# Patient Record
Sex: Male | Born: 1940 | Hispanic: No | Marital: Married | Smoking: Former smoker
Health system: Southern US, Community
[De-identification: ages and names within clinical notes are randomized; demographics above are authoritative.]

## PROBLEM LIST (undated history)

## (undated) DIAGNOSIS — E119 Type 2 diabetes mellitus without complications: Secondary | ICD-10-CM

## (undated) DIAGNOSIS — Z9289 Personal history of other medical treatment: Secondary | ICD-10-CM

## (undated) DIAGNOSIS — K219 Gastro-esophageal reflux disease without esophagitis: Secondary | ICD-10-CM

## (undated) DIAGNOSIS — C801 Malignant (primary) neoplasm, unspecified: Secondary | ICD-10-CM

## (undated) DIAGNOSIS — I10 Essential (primary) hypertension: Secondary | ICD-10-CM

## (undated) DIAGNOSIS — C7B8 Other secondary neuroendocrine tumors: Secondary | ICD-10-CM

## (undated) DIAGNOSIS — C61 Malignant neoplasm of prostate: Secondary | ICD-10-CM

## (undated) DIAGNOSIS — C3490 Malignant neoplasm of unspecified part of unspecified bronchus or lung: Secondary | ICD-10-CM

## (undated) DIAGNOSIS — C7A8 Other malignant neuroendocrine tumors: Secondary | ICD-10-CM

## (undated) DIAGNOSIS — M1990 Unspecified osteoarthritis, unspecified site: Secondary | ICD-10-CM

## (undated) DIAGNOSIS — E785 Hyperlipidemia, unspecified: Secondary | ICD-10-CM

## (undated) DIAGNOSIS — Z5189 Encounter for other specified aftercare: Secondary | ICD-10-CM

## (undated) DIAGNOSIS — M109 Gout, unspecified: Secondary | ICD-10-CM

## (undated) DIAGNOSIS — Z973 Presence of spectacles and contact lenses: Secondary | ICD-10-CM

## (undated) HISTORY — DX: Malignant neoplasm of prostate: C61

## (undated) HISTORY — DX: Hyperlipidemia, unspecified: E78.5

## (undated) HISTORY — DX: Essential (primary) hypertension: I10

## (undated) HISTORY — DX: Other malignant neuroendocrine tumors: C7A.8

## (undated) HISTORY — DX: Other malignant neuroendocrine tumors: C7B.8

## (undated) HISTORY — PX: PROSTATE BIOPSY: SHX241

---

## 2003-02-01 HISTORY — PX: INGUINAL HERNIA REPAIR: SUR1180

## 2008-02-01 HISTORY — PX: COLONOSCOPY: SHX174

## 2008-07-31 ENCOUNTER — Ambulatory Visit: Payer: Self-pay | Admitting: Internal Medicine

## 2008-08-15 ENCOUNTER — Ambulatory Visit: Payer: Self-pay | Admitting: Internal Medicine

## 2008-08-15 ENCOUNTER — Encounter: Admission: RE | Admit: 2008-08-15 | Discharge: 2008-08-15 | Payer: Self-pay | Admitting: Internal Medicine

## 2008-08-15 DIAGNOSIS — K4090 Unilateral inguinal hernia, without obstruction or gangrene, not specified as recurrent: Secondary | ICD-10-CM | POA: Insufficient documentation

## 2008-09-19 ENCOUNTER — Encounter: Payer: Self-pay | Admitting: Internal Medicine

## 2008-10-23 ENCOUNTER — Ambulatory Visit (HOSPITAL_COMMUNITY): Admission: RE | Admit: 2008-10-23 | Discharge: 2008-10-24 | Payer: Self-pay | Admitting: General Surgery

## 2008-10-23 HISTORY — PX: OTHER SURGICAL HISTORY: SHX169

## 2008-12-03 ENCOUNTER — Encounter: Payer: Self-pay | Admitting: Internal Medicine

## 2010-05-07 LAB — DIFFERENTIAL
Basophils Relative: 1 % (ref 0–1)
Eosinophils Absolute: 0.1 10*3/uL (ref 0.0–0.7)
Eosinophils Relative: 2 % (ref 0–5)
Monocytes Relative: 8 % (ref 3–12)
Neutrophils Relative %: 54 % (ref 43–77)

## 2010-05-07 LAB — COMPREHENSIVE METABOLIC PANEL
ALT: 25 U/L (ref 0–53)
Alkaline Phosphatase: 52 U/L (ref 39–117)
CO2: 29 mEq/L (ref 19–32)
GFR calc non Af Amer: >60 mL/min (ref >60)
Glucose, Bld: 131 mg/dL — ABNORMAL HIGH (ref 70–99)
Potassium: 4.1 mEq/L (ref 3.5–5.1)
Sodium: 137 mEq/L (ref 135–145)
Total Protein: 7 g/dL (ref 6.0–8.3)

## 2010-05-07 LAB — GLUCOSE, CAPILLARY
Glucose-Capillary: 157 mg/dL — ABNORMAL HIGH (ref 70–99)
Glucose-Capillary: 165 mg/dL — ABNORMAL HIGH (ref 70–99)
Glucose-Capillary: 169 mg/dL — ABNORMAL HIGH (ref 70–99)

## 2010-05-07 LAB — CBC
Hemoglobin: 14 g/dL (ref 13.0–17.0)
RBC: 5.35 MIL/uL (ref 4.22–5.81)

## 2010-05-09 LAB — GLUCOSE, CAPILLARY
Glucose-Capillary: 155 mg/dL — ABNORMAL HIGH (ref 70–99)
Glucose-Capillary: 163 mg/dL — ABNORMAL HIGH (ref 70–99)

## 2013-03-03 DIAGNOSIS — C61 Malignant neoplasm of prostate: Secondary | ICD-10-CM

## 2013-03-03 HISTORY — DX: Malignant neoplasm of prostate: C61

## 2013-03-10 ENCOUNTER — Encounter: Payer: Self-pay | Admitting: Radiation Oncology

## 2013-03-10 DIAGNOSIS — I10 Essential (primary) hypertension: Secondary | ICD-10-CM | POA: Insufficient documentation

## 2013-03-10 DIAGNOSIS — C61 Malignant neoplasm of prostate: Secondary | ICD-10-CM | POA: Insufficient documentation

## 2013-03-10 DIAGNOSIS — E7800 Pure hypercholesterolemia, unspecified: Secondary | ICD-10-CM | POA: Insufficient documentation

## 2013-03-10 DIAGNOSIS — E119 Type 2 diabetes mellitus without complications: Secondary | ICD-10-CM | POA: Insufficient documentation

## 2013-03-10 NOTE — Progress Notes (Signed)
Radiation Oncology         (336) [phone] ________________________________  Initial outpatient Consultation  Name: [name] MRN: [id number]  Date: 03/11/2013  DOB: Aug 30, 1940  CC:No primary provider on file.  Claybon Jabs, MD   REFERRING PHYSICIAN: Claybon Jabs, MD  DIAGNOSIS: 73 y.o. gentleman with stage T1c adenocarcinoma of the prostate with a Gleason's score of 4+3 and a PSA of 4.09  HISTORY OF PRESENT ILLNESS::[name] is a 73 y.o. gentleman.  He was noted to have an elevated PSA of 4.09 by his primary care physician, Dr. Delfina Redwood.  Accordingly, he was referred for evaluation in urology by Dr. Karsten Ro on 01/08/13,  digital rectal examination was performed at that time revealing some induration in the right apical area.  The patient proceeded to transrectal ultrasound with 12 biopsies of the prostate on 02/19/2013.  The prostate volume measured 61 cc.  Out of 12 core biopsies, 3 were positive.  The maximum Gleason score was 4+3, and this was seen in 60% of the right lateral mid gland specimens. Gleason's 3+4 was seen in less than 5% of the right apex and Gleason's 3+3 was seen in 5% of the right lateral apex.   The patient reviewed the biopsy results with his urologist and he has kindly been referred today for discussion of potential radiation treatment options.     PREVIOUS RADIATION THERAPY: No  PAST MEDICAL HISTORY:  has a past medical history of Prostate cancer; Type II or unspecified type diabetes mellitus without mention of complication, uncontrolled; Hyperlipidemia; and Hypertension.    PAST SURGICAL HISTORY: Past Surgical History  Procedure Laterality Date  . Inguinal hernia repair    . Inguinal hernia repair    . Prostate biopsy      FAMILY HISTORY: family history includes Cancer in his brother, brother, mother, and sister; Heart disease in his father.  SOCIAL HISTORY:  reports that he quit smoking about 2 years ago. His smoking use included  Cigarettes. He has a 30 pack-year smoking history. He has never used smokeless tobacco. He reports that he does not drink alcohol or use illicit drugs.  ALLERGIES: Review of patient's allergies indicates no known allergies.  MEDICATIONS:  Current Outpatient Prescriptions  Medication Sig Dispense Refill  . aspirin 81 MG tablet Take 81 mg by mouth daily.      Marland Kitchen atorvastatin (LIPITOR) 40 MG tablet Take 40 mg by mouth daily.      . insulin glargine (LANTUS) 100 UNIT/ML injection Inject into the skin at bedtime.      . ramipril (ALTACE) 10 MG capsule Take 10 mg by mouth daily.      . sitaGLIPtin (JANUVIA) 100 MG tablet Take 100 mg by mouth daily.       No current facility-administered medications for this encounter.    REVIEW OF SYSTEMS:  A 15 point review of systems is documented in the electronic medical record. This was obtained by the nursing staff. However, I reviewed this with the patient to discuss relevant findings and make appropriate changes.  A comprehensive review of systems was negative..  The patient completed an IPSS and IIEF questionnaire.  His IPSS score was 1 indicating mild urinary outflow obstructive symptoms.  He indicated that his erectile function is able to complete sexual activity less than half of attempts.   PHYSICAL EXAM: This patient is in no acute distress.  He is alert and oriented.   height is 5\' 11"  (1.803 m) and weight is 233  lb (105.688 kg). His oral temperature is 98 F (36.7 C). His blood pressure is 168/95 and his pulse is 86. His respiration is 16 and oxygen saturation is 100%.  He exhibits no respiratory distress or labored breathing.  He appears neurologically intact.  His mood is pleasant.  His affect is appropriate.  Please note the digital rectal exam findings described above.  KPS = 100  100 - Normal; no complaints; no evidence of disease. 90   - Able to carry on normal activity; minor signs or symptoms of disease. 80   - Normal activity with effort;  some signs or symptoms of disease. 53   - Cares for self; unable to carry on normal activity or to do active work. 60   - Requires occasional assistance, but is able to care for most of his personal needs. 50   - Requires considerable assistance and frequent medical care. 48   - Disabled; requires special care and assistance. 56   - Severely disabled; hospital admission is indicated although death not imminent. 20   - Very sick; hospital admission necessary; active supportive treatment necessary. 10   - Moribund; fatal processes progressing rapidly. 0     - Dead  Karnofsky DA, Abelmann [place], Craver LS and Burchenal [facility] [phone]) The use of the nitrogen mustards in the palliative treatment of carcinoma: with particular reference to bronchogenic carcinoma Cancer 1 634-56   LABORATORY DATA:  Lab Results  Component Value Date   WBC 5.9 10/21/2008   HGB 14.0 10/21/2008   HCT 42.0 10/21/2008   MCV 78.6 10/21/2008   PLT 175 10/21/2008   Lab Results  Component Value Date   NA 137 10/21/2008   K 4.1 10/21/2008   CL 107 10/21/2008   CO2 29 10/21/2008   Lab Results  Component Value Date   ALT 25 10/21/2008   AST 20 10/21/2008   ALKPHOS 52 10/21/2008   BILITOT 0.4 10/21/2008     RADIOGRAPHY: No results found.    IMPRESSION: This gentleman is a 73 y.o. gentleman with stage T1c adenocarcinoma of the prostate with a Gleason's score of 4+3 and a PSA of 4.09.  His T-Stage, Gleason's Score, and PSA put him into the intermediate risk group. His primary Gleason's grade of 4 prevent him from being eligible for prostate seed implant as monotherapy. Accordingly he is eligible for a variety of potential treatment options including radical prostatectomy, external beam radiotherapy, or external beam radiotherapy with a prostate seed implant boost.  PLAN:Today I reviewed the findings and workup thus far.  We discussed the natural history of prostate cancer.  We reviewed the the implications of T-stage, Gleason's Score,  and PSA on decision-making and outcomes in prostate cancer.  We discussed radiation treatment in the management of prostate cancer with regard to the logistics and delivery of external beam radiation treatment as well as the logistics and delivery of prostate brachytherapy.  We compared and contrasted each of these approaches and also compared these against prostatectomy.  The patient expressed interest in external beam radiotherapy.  I filled out a patient counseling form for him with relevant treatment diagrams and we retained a copy for our records.   We discussed the role of androgen deprivation with radiotherapy in intermediate risk patients. We reviewed the pros and cons.  The patient would like to proceed with neoadjuvant androgen deprivation then prostate radiotherapy and seed implant boost.  I will share my findings with Dr. Karsten Ro and move forward with scheduling a  follow-up to discuss/proceed with [facility] and subsequent placement of three gold fiducial markers into the prostate to proceed with external radiation in the future.     I enjoyed meeting with him today, and will look forward to participating in the care of this very nice gentleman.  I spent 60 minutes face to face with the patient and more than 50% of that time was spent in counseling and/or coordination of care.   ------------------------------------------------  Sheral Apley. Tammi Klippel, M.D.

## 2013-03-11 ENCOUNTER — Ambulatory Visit
Admission: RE | Admit: 2013-03-11 | Discharge: 2013-03-11 | Disposition: A | Discharge status: Home / Self Care | Payer: Medicare Other | Source: Ambulatory Visit | Attending: Radiation Oncology | Admitting: Radiation Oncology

## 2013-03-11 ENCOUNTER — Encounter: Payer: Self-pay | Admitting: Radiation Oncology

## 2013-03-11 VITALS — BP 168/95 | HR 86 | Temp 98.0°F | Resp 16 | Ht 71.0 in | Wt 233.0 lb

## 2013-03-11 DIAGNOSIS — C61 Malignant neoplasm of prostate: Secondary | ICD-10-CM | POA: Insufficient documentation

## 2013-03-11 DIAGNOSIS — E785 Hyperlipidemia, unspecified: Secondary | ICD-10-CM | POA: Insufficient documentation

## 2013-03-11 DIAGNOSIS — E7800 Pure hypercholesterolemia, unspecified: Secondary | ICD-10-CM

## 2013-03-11 DIAGNOSIS — IMO0001 Reserved for inherently not codable concepts without codable children: Secondary | ICD-10-CM | POA: Insufficient documentation

## 2013-03-11 DIAGNOSIS — K4090 Unilateral inguinal hernia, without obstruction or gangrene, not specified as recurrent: Secondary | ICD-10-CM

## 2013-03-11 DIAGNOSIS — Z79899 Other long term (current) drug therapy: Secondary | ICD-10-CM | POA: Insufficient documentation

## 2013-03-11 DIAGNOSIS — I10 Essential (primary) hypertension: Secondary | ICD-10-CM | POA: Insufficient documentation

## 2013-03-11 DIAGNOSIS — E1165 Type 2 diabetes mellitus with hyperglycemia: Secondary | ICD-10-CM

## 2013-03-11 DIAGNOSIS — Z87891 Personal history of nicotine dependence: Secondary | ICD-10-CM | POA: Insufficient documentation

## 2013-03-11 DIAGNOSIS — Z7982 Long term (current) use of aspirin: Secondary | ICD-10-CM | POA: Insufficient documentation

## 2013-03-11 DIAGNOSIS — Z794 Long term (current) use of insulin: Secondary | ICD-10-CM | POA: Insufficient documentation

## 2013-03-11 DIAGNOSIS — E119 Type 2 diabetes mellitus without complications: Secondary | ICD-10-CM

## 2013-03-11 NOTE — Progress Notes (Signed)
See progress note under physician encounter. 

## 2013-03-11 NOTE — Progress Notes (Signed)
GU Location of Tumor / Histology: adenocarcinoma of the prostate  If Prostate Cancer, Gleason Score is (4 + 3=7) and PSA is (4.09 on 12/2012). Prostate volume: 61 cc.  Patient presented October 2009 with an elevated PSA.  Biopsies of prostate (if applicable) revealed:    Past/Anticipated interventions by urology, if any: TRUS/BX 02/19/2013  Past/Anticipated interventions by medical oncology, if any: None  Weight changes, if any:   Bowel/Bladder complaints, if any:    Nausea/Vomiting, if any:   Pain issues, if any:    SAFETY ISSUES:  Prior radiation? NO  Pacemaker/ICD? NO  Possible current pregnancy? NO  Is the patient on methotrexate? NO  Current Complaints / other details:  73 year old male.

## 2013-03-11 NOTE — Progress Notes (Signed)
IPSS of 1 noted. Denies dysuria, hematuria, diarrhea or constipation. Denies blood in stool or pain associated with bowel movements. Denies pain or fatigue. Reports a strong steady urine stream. Denies difficulty emptying his bladder. Has lost 5 lb since December by eating healthier. Reports occasional night sweats. Denies bone pain.

## 2013-03-12 ENCOUNTER — Telehealth: Payer: Self-pay | Admitting: *Deleted

## 2013-03-12 NOTE — Telephone Encounter (Signed)
CALLED PATIENT TO INFORM OF HORMONE (SHOT) FOR (03-19-13)- ARRIVAL TIME-  9:15 AM  AND HIS GOLD SEE PLACEMENT ON 04-22-13- ARRIVAL TIME- 1:15 PM AT DR. Simone Curia OFFICE, AND HIS SIM ON 04-26-13 @ 9:00 AM @ DR. MANNING'S OFFICE, SPOKE WITH PATIENT AND HE IS AWARE OF THESE APPTS.

## 2013-04-25 NOTE — Progress Notes (Signed)
  Radiation Oncology         [phone] ________________________________  Name: [name] MRN: [id number]  Date: 04/26/2013  DOB: 11-Mar-1940  SIMULATION AND TREATMENT PLANNING NOTE  DIAGNOSIS:  73 y.o. gentleman with stage T1c adenocarcinoma of the prostate with a Gleason's score of 4+3 and a PSA of 4.09  NARRATIVE:  The patient was brought to the [place].  Identity was confirmed.  All relevant records and images related to the planned course of therapy were reviewed.  The patient freely provided informed written consent to proceed with treatment after reviewing the details related to the planned course of therapy. The consent form was witnessed and verified by the simulation staff.  Then, the patient was set-up in a stable reproducible supine position for radiation therapy.  A vacuum lock pillow device was custom fabricated to position his legs in a reproducible immobilized position.  Then, I performed a urethrogram under sterile conditions to identify the prostatic apex.  CT images were obtained.  Surface markings were placed.  The CT images were loaded into the planning software.  Then the prostate target and avoidance structures including the rectum, bladder, bowel and hips were contoured.  Treatment planning then occurred.  The radiation prescription was entered and confirmed.  A total of 5 complex treatment devices were fabricated including a body fix immobilization bag for leg positioning with 4 multileaf collimators to shape radiation around the prostate from the anterior, posterior, right lateral, and left lateral gantry positions. I have requested : 3D Simulation  I have requested a DVH of the following structures: Prostate, bladder, rectum, hips, and small bowel.  PUBIC ARCH STUDY:  The patient is intended to receive possible prostate seed implant. His 3-dimensional image study set was employed to evaluate his candidacy. There, on each axial slice, I contoured the  prostate gland. Then, using three-dimensional radiation planning tools I reconstructed the prostate in view of the structures from the transperineal needle pathway to assess for possible pubic arch interference. In doing so, I did not appreciate any pubic arch interference. Also, the patient's prostate volume was estimated based on the drawn structure. The volume was 65 cc correlating relatively well with the ultrasound volume study at 61 cc.  The patient has started hormone therapy which should produce some further downsizing.  Given the pubic arch appearance and prostate volume, patient remains a good candidate to proceed with prostate seed boost.   PLAN:  The patient will receive 45 Gy in 25 fractions followed by prostate seed implant boost to additional dose of 110 Gy for a total normal dose of 155 Gy.  ________________________________  Sheral Apley Tammi Klippel, M.D.

## 2013-04-26 ENCOUNTER — Ambulatory Visit
Admission: RE | Admit: 2013-04-26 | Discharge: 2013-04-26 | Disposition: A | Discharge status: Home / Self Care | Payer: Medicare Other | Source: Ambulatory Visit | Attending: Radiation Oncology | Admitting: Radiation Oncology

## 2013-04-26 DIAGNOSIS — C61 Malignant neoplasm of prostate: Secondary | ICD-10-CM

## 2013-04-26 DIAGNOSIS — Z510 Encounter for antineoplastic radiation therapy: Secondary | ICD-10-CM | POA: Insufficient documentation

## 2013-05-03 ENCOUNTER — Encounter: Payer: Self-pay | Admitting: Radiation Oncology

## 2013-05-03 ENCOUNTER — Ambulatory Visit
Admission: RE | Admit: 2013-05-03 | Discharge: 2013-05-03 | Disposition: A | Discharge status: Home / Self Care | Payer: Medicare Other | Source: Ambulatory Visit | Attending: Radiation Oncology | Admitting: Radiation Oncology

## 2013-05-06 ENCOUNTER — Ambulatory Visit
Admission: RE | Admit: 2013-05-06 | Discharge: 2013-05-06 | Disposition: A | Discharge status: Home / Self Care | Payer: Medicare Other | Source: Ambulatory Visit | Attending: Radiation Oncology | Admitting: Radiation Oncology

## 2013-05-07 ENCOUNTER — Ambulatory Visit
Admission: RE | Admit: 2013-05-07 | Discharge: 2013-05-07 | Disposition: A | Discharge status: Home / Self Care | Payer: Medicare Other | Source: Ambulatory Visit | Attending: Radiation Oncology | Admitting: Radiation Oncology

## 2013-05-08 ENCOUNTER — Ambulatory Visit
Admission: RE | Admit: 2013-05-08 | Discharge: 2013-05-08 | Disposition: A | Discharge status: Home / Self Care | Payer: Medicare Other | Source: Ambulatory Visit | Attending: Radiation Oncology | Admitting: Radiation Oncology

## 2013-05-09 ENCOUNTER — Encounter: Payer: Self-pay | Admitting: Radiation Oncology

## 2013-05-09 ENCOUNTER — Ambulatory Visit
Admission: RE | Admit: 2013-05-09 | Discharge: 2013-05-09 | Disposition: A | Discharge status: Home / Self Care | Payer: Medicare Other | Source: Ambulatory Visit | Attending: Radiation Oncology | Admitting: Radiation Oncology

## 2013-05-09 VITALS — BP 171/95 | HR 79 | Temp 97.6°F | Resp 20 | Wt 231.0 lb

## 2013-05-09 DIAGNOSIS — C61 Malignant neoplasm of prostate: Secondary | ICD-10-CM

## 2013-05-09 NOTE — Progress Notes (Addendum)
Post sim ed completed w/pt and wife. Gave pt "Radiation and You" booklet w/all pertinent information marked and discussed, re: rectal discomfort/care, urinary/bladder issues/management, nutrition, pain. Pt and wife verbalized understanding. Pt denies pain, urinary/bowel issues, fatigue, loss of appetite.

## 2013-05-09 NOTE — Addendum Note (Signed)
Encounter addended by: Andria Rhein, RN on: 05/09/2013  2:33 PM<BR>     Documentation filed: Notes Section, Inpatient Patient Education

## 2013-05-09 NOTE — Progress Notes (Signed)
  Radiation Oncology         [phone] ________________________________  Name: [name] MRN: [id number]  Date: 05/09/2013  DOB: 1940/03/02  Weekly Radiation Therapy Management  Current Dose: 5.4 Gy     Planned Dose:  45 Gy plus seed implant  Narrative . . . . . . . . The patient presents for routine under treatment assessment.                                   The patient is without complaint.                                 Set-up films were reviewed.                                 The chart was checked. Physical Findings. . .  weight is 231 lb (104.781 kg). His temperature is 97.6 F (36.4 C). His blood pressure is 171/95 and his pulse is 79. His respiration is 20.  Weight essentially stable.  No significant changes. Impression . . . . . . . The patient is tolerating radiation. Plan . . . . . . . . . . . . Continue treatment as planned.  ________________________________  Sheral Apley. Tammi Klippel, M.D.

## 2013-05-10 ENCOUNTER — Other Ambulatory Visit: Payer: Self-pay | Admitting: Urology

## 2013-05-10 ENCOUNTER — Ambulatory Visit
Admission: RE | Admit: 2013-05-10 | Discharge: 2013-05-10 | Disposition: A | Discharge status: Home / Self Care | Payer: Medicare Other | Source: Ambulatory Visit | Attending: Radiation Oncology | Admitting: Radiation Oncology

## 2013-05-13 ENCOUNTER — Ambulatory Visit
Admission: RE | Admit: 2013-05-13 | Discharge: 2013-05-13 | Disposition: A | Discharge status: Home / Self Care | Payer: Medicare Other | Source: Ambulatory Visit | Attending: Radiation Oncology | Admitting: Radiation Oncology

## 2013-05-14 ENCOUNTER — Ambulatory Visit
Admission: RE | Admit: 2013-05-14 | Discharge: 2013-05-14 | Disposition: A | Discharge status: Home / Self Care | Payer: Medicare Other | Source: Ambulatory Visit | Attending: Radiation Oncology | Admitting: Radiation Oncology

## 2013-05-15 ENCOUNTER — Encounter: Payer: Self-pay | Admitting: Radiation Oncology

## 2013-05-15 ENCOUNTER — Ambulatory Visit
Admission: RE | Admit: 2013-05-15 | Discharge: 2013-05-15 | Disposition: A | Discharge status: Home / Self Care | Payer: Medicare Other | Source: Ambulatory Visit | Attending: Radiation Oncology | Admitting: Radiation Oncology

## 2013-05-15 NOTE — Progress Notes (Signed)
  Radiation Oncology         [phone] ________________________________  Name: [name] MRN: [id number]  Date: 05/16/2013  DOB: January 15, 1941  Weekly Radiation Therapy Management  Current Dose: 16.2 Gy     Planned Dose:  45 Gy plus seed implant  Narrative . . . . . . . . The patient presents for routine under treatment assessment.                                   The patient is without complaint.  Some urinary frequency                                 Set-up films were reviewed.                                 The chart was checked. Physical Findings. . .  weight is 229 lb 14.4 oz (104.282 kg). His blood pressure is 153/92 and his pulse is 90. His respiration is 16. . Weight essentially stable.  No significant changes. Impression . . . . . . . The patient is tolerating radiation. Plan . . . . . . . . . . . . Continue treatment as planned.  ________________________________  Sheral Apley. Tammi Klippel, M.D.

## 2013-05-16 ENCOUNTER — Ambulatory Visit
Admission: RE | Admit: 2013-05-16 | Discharge: 2013-05-16 | Disposition: A | Discharge status: Home / Self Care | Payer: Medicare Other | Source: Ambulatory Visit | Attending: Radiation Oncology | Admitting: Radiation Oncology

## 2013-05-16 ENCOUNTER — Encounter: Payer: Self-pay | Admitting: Radiation Oncology

## 2013-05-16 VITALS — BP 153/92 | HR 90 | Resp 16 | Wt 229.9 lb

## 2013-05-16 DIAGNOSIS — C61 Malignant neoplasm of prostate: Secondary | ICD-10-CM

## 2013-05-16 NOTE — Progress Notes (Signed)
Weight stable. Denies dysuria. Reports nocturia x2. Reports a strong steady urine stream. Denies diarrhea. Denies fatigue.

## 2013-05-17 ENCOUNTER — Ambulatory Visit
Admission: RE | Admit: 2013-05-17 | Discharge: 2013-05-17 | Disposition: A | Discharge status: Home / Self Care | Payer: Medicare Other | Source: Ambulatory Visit | Attending: Radiation Oncology | Admitting: Radiation Oncology

## 2013-05-20 ENCOUNTER — Ambulatory Visit
Admission: RE | Admit: 2013-05-20 | Discharge: 2013-05-20 | Disposition: A | Discharge status: Home / Self Care | Payer: Medicare Other | Source: Ambulatory Visit | Attending: Radiation Oncology | Admitting: Radiation Oncology

## 2013-05-21 ENCOUNTER — Ambulatory Visit
Admission: RE | Admit: 2013-05-21 | Discharge: 2013-05-21 | Disposition: A | Discharge status: Home / Self Care | Payer: Medicare Other | Source: Ambulatory Visit | Attending: Radiation Oncology | Admitting: Radiation Oncology

## 2013-05-22 ENCOUNTER — Encounter: Payer: Self-pay | Admitting: Internal Medicine

## 2013-05-22 ENCOUNTER — Ambulatory Visit
Admission: RE | Admit: 2013-05-22 | Discharge: 2013-05-22 | Disposition: A | Discharge status: Home / Self Care | Payer: Medicare Other | Source: Ambulatory Visit | Attending: Radiation Oncology | Admitting: Radiation Oncology

## 2013-05-23 ENCOUNTER — Ambulatory Visit
Admission: RE | Admit: 2013-05-23 | Discharge: 2013-05-23 | Disposition: A | Discharge status: Home / Self Care | Payer: Medicare Other | Source: Ambulatory Visit | Attending: Radiation Oncology | Admitting: Radiation Oncology

## 2013-05-23 ENCOUNTER — Encounter: Payer: Self-pay | Admitting: Radiation Oncology

## 2013-05-23 VITALS — BP 179/82 | HR 77 | Resp 16 | Wt 233.5 lb

## 2013-05-23 DIAGNOSIS — C61 Malignant neoplasm of prostate: Secondary | ICD-10-CM

## 2013-05-23 NOTE — Progress Notes (Signed)
  Radiation Oncology         [phone] ________________________________  Name: [name] MRN: [id number]  Date: 05/23/2013  DOB: [birth year]    Weekly Radiation Therapy Management  Current Dose: 25.2 Gy     Planned Dose:  45 Gy plus seed implant boost  Narrative . . . . . . . . The patient presents for routine under treatment assessment.                                   The patient is without complaint.                                 Set-up films were reviewed.                                 The chart was checked. Physical Findings. . .  weight is 233 lb 8 oz (105.915 kg). His blood pressure is 179/82 and his pulse is 77. His respiration is 16. . Weight essentially stable.  No significant changes. Impression . . . . . . . The patient is tolerating radiation. Plan . . . . . . . . . . . . Continue treatment as planned.  ________________________________  Sheral Apley. Tammi Klippel, M.D.

## 2013-05-23 NOTE — Progress Notes (Signed)
Reports nocturia x 2. Denies dysuria. Denies diarrhea or loose stool. Denies fatigue. Vitals WDL. Denies pain at this time. Blood pressure elevated despite taking bp medication.

## 2013-05-24 ENCOUNTER — Ambulatory Visit
Admission: RE | Admit: 2013-05-24 | Discharge: 2013-05-24 | Disposition: A | Discharge status: Home / Self Care | Payer: Medicare Other | Source: Ambulatory Visit | Attending: Radiation Oncology | Admitting: Radiation Oncology

## 2013-05-27 ENCOUNTER — Ambulatory Visit
Admission: RE | Admit: 2013-05-27 | Discharge: 2013-05-27 | Disposition: A | Discharge status: Home / Self Care | Payer: Medicare Other | Source: Ambulatory Visit | Attending: Radiation Oncology | Admitting: Radiation Oncology

## 2013-05-28 ENCOUNTER — Ambulatory Visit
Admission: RE | Admit: 2013-05-28 | Discharge: 2013-05-28 | Disposition: A | Discharge status: Home / Self Care | Payer: Medicare Other | Source: Ambulatory Visit | Attending: Radiation Oncology | Admitting: Radiation Oncology

## 2013-05-29 ENCOUNTER — Ambulatory Visit
Admission: RE | Admit: 2013-05-29 | Discharge: 2013-05-29 | Disposition: A | Discharge status: Home / Self Care | Payer: Medicare Other | Source: Ambulatory Visit | Attending: Radiation Oncology | Admitting: Radiation Oncology

## 2013-05-30 ENCOUNTER — Telehealth: Payer: Self-pay | Admitting: *Deleted

## 2013-05-30 ENCOUNTER — Ambulatory Visit
Admission: RE | Admit: 2013-05-30 | Discharge: 2013-05-30 | Disposition: A | Discharge status: Home / Self Care | Payer: Medicare Other | Source: Ambulatory Visit | Attending: Radiation Oncology | Admitting: Radiation Oncology

## 2013-05-30 NOTE — Telephone Encounter (Signed)
Called patient to ask question, lvm for a return call

## 2013-05-31 ENCOUNTER — Encounter: Payer: Self-pay | Admitting: Radiation Oncology

## 2013-05-31 ENCOUNTER — Encounter (HOSPITAL_BASED_OUTPATIENT_CLINIC_OR_DEPARTMENT_OTHER): Payer: Medicare Other

## 2013-05-31 ENCOUNTER — Ambulatory Visit
Admission: RE | Admit: 2013-05-31 | Discharge: 2013-05-31 | Disposition: A | Discharge status: Home / Self Care | Payer: Medicare Other | Source: Ambulatory Visit | Attending: Radiation Oncology | Admitting: Radiation Oncology

## 2013-05-31 ENCOUNTER — Ambulatory Visit (HOSPITAL_BASED_OUTPATIENT_CLINIC_OR_DEPARTMENT_OTHER)
Admission: RE | Admit: 2013-05-31 | Discharge: 2013-05-31 | Disposition: A | Discharge status: Home / Self Care | Payer: Medicare Other | Source: Ambulatory Visit | Attending: Urology | Admitting: Urology

## 2013-05-31 VITALS — BP 178/94 | HR 74 | Temp 97.8°F | Resp 20 | Wt 233.9 lb

## 2013-05-31 DIAGNOSIS — Z01811 Encounter for preprocedural respiratory examination: Secondary | ICD-10-CM | POA: Insufficient documentation

## 2013-05-31 DIAGNOSIS — C61 Malignant neoplasm of prostate: Secondary | ICD-10-CM

## 2013-05-31 DIAGNOSIS — Z01810 Encounter for preprocedural cardiovascular examination: Secondary | ICD-10-CM | POA: Insufficient documentation

## 2013-05-31 NOTE — Progress Notes (Signed)
  Radiation Oncology         (336) [phone] ________________________________  Name: [name] MRN: [id number]  Date: 05/31/2013  DOB: 01-31-1941  Weekly Radiation Therapy Management    Current Dose: 36 Gy     Planned Dose:  45 Gy plus seed implant on 07/04/13  Narrative . . . . . . . . The patient presents for routine under treatment assessment.                                   The patient is without complaint.                                 Set-up films were reviewed.                                 The chart was checked. Physical Findings. . .  weight is 233 lb 14.4 oz (106.096 kg). His temperature is 97.8 F (36.6 C). His blood pressure is 178/94 and his pulse is 74. His respiration is 20. . Weight essentially stable.  No significant changes. Impression . . . . . . . The patient is tolerating radiation. Plan . . . . . . . . . . . . Continue treatment as planned.  ________________________________  Sheral Apley. Tammi Klippel, M.D.

## 2013-05-31 NOTE — Progress Notes (Signed)
Weekly rad tx prostate  Completed, no c/o hematuria,no dysuria, occasional urgency, up 2x night to void, good appetite, drinks plenty fluid, no fatigue stated 11:23 AM

## 2013-06-03 ENCOUNTER — Ambulatory Visit
Admission: RE | Admit: 2013-06-03 | Discharge: 2013-06-03 | Disposition: A | Discharge status: Home / Self Care | Payer: Medicare Other | Source: Ambulatory Visit | Attending: Radiation Oncology | Admitting: Radiation Oncology

## 2013-06-04 ENCOUNTER — Ambulatory Visit
Admission: RE | Admit: 2013-06-04 | Discharge: 2013-06-04 | Disposition: A | Discharge status: Home / Self Care | Payer: Medicare Other | Source: Ambulatory Visit | Attending: Radiation Oncology | Admitting: Radiation Oncology

## 2013-06-05 ENCOUNTER — Ambulatory Visit
Admission: RE | Admit: 2013-06-05 | Discharge: 2013-06-05 | Disposition: A | Discharge status: Home / Self Care | Payer: Medicare Other | Source: Ambulatory Visit | Attending: Radiation Oncology | Admitting: Radiation Oncology

## 2013-06-06 ENCOUNTER — Ambulatory Visit
Admission: RE | Admit: 2013-06-06 | Discharge: 2013-06-06 | Disposition: A | Discharge status: Home / Self Care | Payer: Medicare Other | Source: Ambulatory Visit | Attending: Radiation Oncology | Admitting: Radiation Oncology

## 2013-06-06 ENCOUNTER — Encounter: Payer: Self-pay | Admitting: Radiation Oncology

## 2013-06-06 VITALS — BP 150/92 | HR 84 | Resp 16 | Wt 234.2 lb

## 2013-06-06 DIAGNOSIS — C61 Malignant neoplasm of prostate: Secondary | ICD-10-CM

## 2013-06-06 NOTE — Progress Notes (Signed)
  Radiation Oncology         [phone] ________________________________  Name: [name] MRN: [id number]  Date: 06/06/2013  DOB: 05-Jul-1940    Weekly Radiation Therapy Management  Current Dose: 43.2 Gy     Planned Dose:  45 Gy  Plus seed implant.  Narrative . . . . . . . . The patient presents for routine under treatment assessment.                                   The patient is without complaint.  Weight stable.  Denies dysuria or hematuria. Denies diarrhea. Reports nocturia x 2. Denies he has to strain to void. Reports a strong steady urine stream. Denies headache, dizziness, nausea or vomiting. Denies bone pain or night sweats                                 Set-up films were reviewed.                                 The chart was checked. Physical Findings. . .  weight is 234 lb 3.2 oz (106.232 kg). His blood pressure is 150/92 and his pulse is 84. His respiration is 16. . Weight essentially stable.  No significant changes. Impression . . . . . . . The patient is tolerating radiation. Plan . . . . . . . . . . . . Continue treatment as planned.  Completes treatment tomorrow. Implant scheduled for June 4th.  ________________________________  Sheral Apley Tammi Klippel, M.D.

## 2013-06-06 NOTE — Progress Notes (Addendum)
Weight stable. Implant scheduled for June 4th. Denies dysuria or hematuria. Denies diarrhea. Reports nocturia x 2. Denies he has to strain to void. Reports a strong steady urine stream. Denies headache, dizziness, nausea or vomiting. Denies bone pain or night sweats. Completes treatment tomorrow. One month follow up appointment card given.

## 2013-06-07 ENCOUNTER — Ambulatory Visit
Admission: RE | Admit: 2013-06-07 | Discharge: 2013-06-07 | Disposition: A | Discharge status: Home / Self Care | Payer: Medicare Other | Source: Ambulatory Visit | Attending: Radiation Oncology | Admitting: Radiation Oncology

## 2013-06-07 ENCOUNTER — Encounter: Payer: Self-pay | Admitting: Radiation Oncology

## 2013-06-09 NOTE — Progress Notes (Signed)
  Radiation Oncology         (336) [phone] ________________________________  Name: [name] MRN: [id number]  Date: 07/04/2013  DOB: [birth year]  End of Treatment Note  Diagnosis:   73 y.o. gentleman with stage T1c adenocarcinoma of the prostate with a Gleason's score of 4+3 and a PSA of 4.09  Indication for treatment:  Androgen Deprivation (6 months), External Radiation and Seed Implant Boost - Curative       Radiation treatment dates:   05/06/2013-06/07/2013, with Seed Implant on 07/08/2013  Site/dose:    1.  The prostate, proximal seminal vesicles, and periprostatic tissue was treated to 45 Gy in 25 fractions of 1.8 Gy 2.  The prostate was boosted with a seed implant to 110 Gy, for a total nominal dose of 155 Gy  Beams/energy:    1.  The prostate, proximal seminal vesicles, and periprostatic tissue was treated using a 4-field 3D conformal treatment arrangement employing 15 MV X-rays from anterior, posterior, right, and left angles with independent collimation to account for the projection from each angle. Immobilization was achieved with a VacLoc leg body cushion and image guidance was performed with per fraction cone beam CT registration and table adjustments. 2.  The prostate was boosted a transperineal prostate seed implant.  The [employer] system was used to place the needles under sagittal guidance. A total of 23 needles were used to deposit 57 radioactive I-125 seeds in the prostate gland. The individual seed activity was 0.503 mCi for a total implant activity of 28.671 mCi.  Narrative: The patient tolerated radiation treatment relatively well.   During external radiation he reported neither dysuria nor hematuria. Denied diarrhea. Reported nocturia x 2. Denied he had to strain to void. Reported a strong steady urine stream.  No complications associated with seed implant.  Plan: The patient has completed radiation treatment. The patient will return to radiation oncology clinic  for routine followup in one month. I advised them to call or return sooner if they have any questions or concerns related to their recovery or treatment. ________________________________  Sheral Apley. Tammi Klippel, M.D.

## 2013-06-26 ENCOUNTER — Telehealth: Payer: Self-pay | Admitting: *Deleted

## 2013-06-26 NOTE — Telephone Encounter (Signed)
CALLED PATIENT TO REMIND OF APPT. FOR 06-27-13, LVM FOR A RETURN CALL

## 2013-06-27 ENCOUNTER — Encounter (HOSPITAL_BASED_OUTPATIENT_CLINIC_OR_DEPARTMENT_OTHER): Payer: Self-pay | Admitting: *Deleted

## 2013-06-28 ENCOUNTER — Encounter (HOSPITAL_BASED_OUTPATIENT_CLINIC_OR_DEPARTMENT_OTHER): Payer: Self-pay | Admitting: *Deleted

## 2013-06-28 LAB — COMPREHENSIVE METABOLIC PANEL
ALT: 50 U/L (ref 0–53)
AST: 38 U/L — ABNORMAL HIGH (ref 0–37)
Albumin: 4.4 g/dL (ref 3.5–5.2)
Alkaline Phosphatase: 65 U/L (ref 39–117)
BUN: 12 mg/dL (ref 6–23)
CO2: 26 mEq/L (ref 19–32)
Calcium: 9.9 mg/dL (ref 8.4–10.5)
Chloride: 101 mEq/L (ref 96–112)
Creatinine, Ser: 0.86 mg/dL (ref 0.50–1.35)
GFR calc Af Amer: >90 mL/min (ref >90)
GFR calc non Af Amer: 84 mL/min — ABNORMAL LOW (ref >90)
Glucose, Bld: 168 mg/dL — ABNORMAL HIGH (ref 70–99)
Potassium: 4.3 mEq/L (ref 3.7–5.3)
Sodium: 142 mEq/L (ref 137–147)
Total Bilirubin: 0.6 mg/dL (ref 0.3–1.2)
Total Protein: 7.6 g/dL (ref 6.0–8.3)

## 2013-06-28 LAB — CBC
HCT: 42.3 % (ref 39.0–52.0)
Hemoglobin: 14.2 g/dL (ref 13.0–17.0)
MCH: 25.8 pg — ABNORMAL LOW (ref 26.0–34.0)
MCHC: 33.6 g/dL (ref 30.0–36.0)
MCV: 76.8 fL — ABNORMAL LOW (ref 78.0–100.0)
Platelets: 155 10*3/uL (ref 150–400)
RBC: 5.51 MIL/uL (ref 4.22–5.81)
RDW: 15 % (ref 11.5–15.5)
WBC: 5 10*3/uL (ref 4.0–10.5)

## 2013-06-28 LAB — PROTIME-INR
INR: 0.98 (ref 0.00–1.49)
Prothrombin Time: 12.8 seconds (ref 11.6–15.2)

## 2013-06-28 LAB — APTT: aPTT: 35 seconds (ref 24–37)

## 2013-06-28 NOTE — Progress Notes (Signed)
NPO AFTER MN. ARRIVE AT 0700.  CURRENT LAB RESULTS, EKG, AND CXR IN EPIC AND CHART. WILL TAKE PRILOSEC AM DOS W/ SIPS OF WATER AND DO FLEET ENEMA .

## 2013-07-03 ENCOUNTER — Telehealth: Payer: Self-pay | Admitting: *Deleted

## 2013-07-03 NOTE — Telephone Encounter (Signed)
Called patient to remind of procedure for 07-04-13, no answer will call later

## 2013-07-03 NOTE — Telephone Encounter (Signed)
Called patient to remind of procedure for 07-04-13, spoke with patient and he is aware of this procedure.

## 2013-07-03 NOTE — H&P (Signed)
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 Subsequent PSAs were checked and shown to remain stable and normal.  PSAs:  10/09 - 1.52  11/10 - 2.97  12/10 - 3.21   5/11 - 2.34  9/11 - 2.69  9/12 - 1.90  10/13 - 2.4  11/14 - 4.09  He was found to have slight nodularity on DRE located in the apex of the prostate on the right hand side.  TRUS/BX 02/19/13: Prostate volume 61 cc  Pathology: Adenocarcinoma 3/12 cores positive (4+3 = 7 50%, 3+4 = 7 <5% and 3+3 = 6 5%) none of the cores from the apical region on the right were positive.  Stage: T1c    Interval history:   Past Medical History Problems  1. History of Diabetes type 2, uncontrolled (250.02) 2. History of diabetes mellitus (V12.29) 3. History of hypercholesterolemia (V12.29) 4. History of hypertension (V12.59)  Surgical History Problems  1. History of Inguinal Hernia Repair 2. History of Inguinal Hernia Repair  Current Meds 1. Actoplus Met 15-500 MG Oral Tablet;  Therapy: (Recorded:16Feb2011) to Recorded 2. AmLODIPine Besylate 10 MG Oral Tablet;  Therapy: (Recorded:16Feb2011) to Recorded 3. Atorvastatin Calcium 40 MG Oral Tablet;  Therapy: [id number] to Recorded 4. Boostrix 5-2.5-18.5 SUSP;  Therapy: [id number] to Recorded 5. Coreg CR 40 MG Oral Capsule Extended Release 24 Hour;  Therapy: (Recorded:16Feb2011) to Recorded 6. Ecotrin 325 MG Oral Tablet Delayed Release;  Therapy: (Recorded:16Feb2011) to Recorded 7. Januvia 100 MG Oral Tablet;  Therapy: 20Aug2014 to Recorded 8. Lantus SoloStar 100 UNIT/ML Subcutaneous Solution Pen-injector;  Therapy: 31Oct2014 to Recorded 9. MetFORMIN HCl - 1000 MG Oral Tablet;  Therapy: (Recorded:16Feb2011) to Recorded 10. Ramipril 10 MG Oral Capsule;   Therapy: (Recorded:16Feb2011) to Recorded 11. Simvastatin 20 MG Oral Tablet;   Therapy: (Recorded:16Feb2011) to  Recorded  Allergies Medication  1. No Known Drug Allergies  Family History Problems  1. Family history of Breast Cancer (V16.3) : Sister 2. Family history of Death In The Family Father   Heart disease 3. Family history of Death In The Family Mother   Ovarian cancer 4. Family history of Family Health Status Number Of Children   1 daughter 5. Family history of Lung Cancer (V16.1) : Brother 6. Family history of Ovarian Cancer (V16.41) : Mother 7. Family history of Prostate Cancer (I50.27) : Brother  Social History Problems  1. Denied: History of Alcohol Use 2. Caffeine Use   1 a day 3. Former smoker (V15.82) 4. Marital History - Currently Married 5. Occupation: Retired 39. History of Tobacco Use (V15.82)   1 ppd for 30 years.  Quit 2 years ago  Review of Systems Genitourinary, constitutional, skin, eye, otolaryngeal, hematologic/lymphatic, cardiovascular, pulmonary, endocrine, musculoskeletal, gastrointestinal, neurological and psychiatric system(s) were reviewed and pertinent findings if present are noted.    Vitals Vital Signs Blood Pressure  Blood Pressure: 149 / 95 Temperature  Temperature: 97.8 F Pulse  Heart Rate: 82  Physical Exam Constitutional: Well nourished and well developed. No acute distress.  ENT:. The ears and nose are normal in appearance.  Neck: The appearance of the neck is normal and no neck mass is present.  Pulmonary: No respiratory distress and normal respiratory rhythm and effort.  Cardiovascular: Heart rate and rhythm are normal. No peripheral edema.  Abdomen: The abdomen is soft and nontender. No masses are palpated. No CVA tenderness. No hernias are palpable.  No hepatosplenomegaly noted.  Rectal: Rectal exam demonstrates normal sphincter tone, no tenderness and no masses. The prostate has no nodularity and is not tender. The left seminal vesicle is nonpalpable. The right seminal vesicle is nonpalpable. The perineum is normal on inspection.   Genitourinary: Examination of the penis demonstrates no discharge, no masses, no lesions and a normal meatus. The scrotum is without lesions. The right epididymis is palpably normal and non-tender. The left epididymis is palpably normal and non-tender. The right testis is non-tender and without masses. The left testis is non-tender and without masses.  Lymphatics: The femoral and inguinal nodes are not enlarged or tender.  Skin: Normal skin turgor, no visible rash and no visible skin lesions.  Neuro/Psych:. Mood and affect are appropriate.   Assessment: He decided to proceed with external beam radiation with neoadjuvant hormone ablation therapy and radioactive seed boost.  We also discussed the rationale behind Lupron therapy in conjunction with the patient. I went over the treatment course and the side effects of the medication with him.   I discussed the risks and complications of radioactive seed implantation with him including the probability of success, the outpatient nature of the procedure, the anticipated postoperative course and have answered all of his course to his satisfaction.  He has elected to proceed.  Plan He proceeded with external beam radiation with planned radioactive seed boost.

## 2013-07-04 ENCOUNTER — Encounter (HOSPITAL_BASED_OUTPATIENT_CLINIC_OR_DEPARTMENT_OTHER): Payer: Self-pay | Admitting: *Deleted

## 2013-07-04 ENCOUNTER — Ambulatory Visit (HOSPITAL_BASED_OUTPATIENT_CLINIC_OR_DEPARTMENT_OTHER): Payer: Medicare Other | Admitting: Anesthesiology

## 2013-07-04 ENCOUNTER — Encounter (HOSPITAL_BASED_OUTPATIENT_CLINIC_OR_DEPARTMENT_OTHER)
Admission: RE | Disposition: A | Discharge status: Home / Self Care | Payer: Self-pay | Source: Ambulatory Visit | Attending: Urology

## 2013-07-04 ENCOUNTER — Ambulatory Visit (HOSPITAL_COMMUNITY): Payer: Medicare Other

## 2013-07-04 ENCOUNTER — Ambulatory Visit (HOSPITAL_BASED_OUTPATIENT_CLINIC_OR_DEPARTMENT_OTHER)
Admission: RE | Admit: 2013-07-04 | Discharge: 2013-07-04 | Disposition: A | Discharge status: Home / Self Care | Payer: Medicare Other | Source: Ambulatory Visit | Attending: Urology | Admitting: Urology

## 2013-07-04 ENCOUNTER — Encounter (HOSPITAL_BASED_OUTPATIENT_CLINIC_OR_DEPARTMENT_OTHER): Payer: Medicare Other | Admitting: Anesthesiology

## 2013-07-04 DIAGNOSIS — I10 Essential (primary) hypertension: Secondary | ICD-10-CM | POA: Insufficient documentation

## 2013-07-04 DIAGNOSIS — Z6831 Body mass index (BMI) 31.0-31.9, adult: Secondary | ICD-10-CM | POA: Insufficient documentation

## 2013-07-04 DIAGNOSIS — Z794 Long term (current) use of insulin: Secondary | ICD-10-CM | POA: Insufficient documentation

## 2013-07-04 DIAGNOSIS — K219 Gastro-esophageal reflux disease without esophagitis: Secondary | ICD-10-CM | POA: Insufficient documentation

## 2013-07-04 DIAGNOSIS — E1165 Type 2 diabetes mellitus with hyperglycemia: Secondary | ICD-10-CM

## 2013-07-04 DIAGNOSIS — IMO0001 Reserved for inherently not codable concepts without codable children: Secondary | ICD-10-CM | POA: Insufficient documentation

## 2013-07-04 DIAGNOSIS — E7800 Pure hypercholesterolemia, unspecified: Secondary | ICD-10-CM | POA: Insufficient documentation

## 2013-07-04 DIAGNOSIS — E669 Obesity, unspecified: Secondary | ICD-10-CM | POA: Insufficient documentation

## 2013-07-04 DIAGNOSIS — Z87891 Personal history of nicotine dependence: Secondary | ICD-10-CM | POA: Insufficient documentation

## 2013-07-04 DIAGNOSIS — Z79899 Other long term (current) drug therapy: Secondary | ICD-10-CM | POA: Insufficient documentation

## 2013-07-04 DIAGNOSIS — C61 Malignant neoplasm of prostate: Secondary | ICD-10-CM | POA: Diagnosis not present

## 2013-07-04 HISTORY — PX: RADIOACTIVE SEED IMPLANT: SHX5150

## 2013-07-04 HISTORY — DX: Gastro-esophageal reflux disease without esophagitis: K21.9

## 2013-07-04 HISTORY — DX: Type 2 diabetes mellitus without complications: E11.9

## 2013-07-04 HISTORY — PX: CYSTOSCOPY: SHX5120

## 2013-07-04 HISTORY — DX: Unspecified osteoarthritis, unspecified site: M19.90

## 2013-07-04 HISTORY — DX: Presence of spectacles and contact lenses: Z97.3

## 2013-07-04 LAB — GLUCOSE, CAPILLARY
Glucose-Capillary: 129 mg/dL — ABNORMAL HIGH (ref 70–99)
Glucose-Capillary: 124 mg/dL — ABNORMAL HIGH (ref 70–99)

## 2013-07-04 SURGERY — INSERTION, RADIATION SOURCE, PROSTATE
Anesthesia: General | Site: Prostate

## 2013-07-04 MED ORDER — MIDAZOLAM HCL 5 MG/5ML IJ SOLN
INTRAMUSCULAR | Status: DC | PRN
  Administered 2013-07-04: 1 mg via INTRAVENOUS

## 2013-07-04 MED ORDER — LIDOCAINE HCL (CARDIAC) 20 MG/ML IV SOLN
INTRAVENOUS | Status: DC | PRN
  Administered 2013-07-04: 60 mg via INTRAVENOUS

## 2013-07-04 MED ORDER — ONDANSETRON HCL 4 MG/2ML IJ SOLN
INTRAMUSCULAR | Status: DC | PRN
  Administered 2013-07-04: 4 mg via INTRAVENOUS

## 2013-07-04 MED ORDER — OXYCODONE HCL 5 MG/5ML PO SOLN
5.0000 mg | Freq: Once | ORAL | Status: DC | PRN
  Filled 2013-07-04: qty 5

## 2013-07-04 MED ORDER — CIPROFLOXACIN IN D5W 400 MG/200ML IV SOLN
400.0000 mg | INTRAVENOUS | Status: AC
  Administered 2013-07-04: 400 mg via INTRAVENOUS
  Filled 2013-07-04: qty 200

## 2013-07-04 MED ORDER — FLEET ENEMA 7-19 GM/118ML RE ENEM
1.0000 | ENEMA | Freq: Once | RECTAL | Status: DC
  Filled 2013-07-04: qty 1

## 2013-07-04 MED ORDER — MIDAZOLAM HCL 2 MG/2ML IJ SOLN
INTRAMUSCULAR | Status: AC
  Filled 2013-07-04: qty 2

## 2013-07-04 MED ORDER — HYDROMORPHONE HCL PF 1 MG/ML IJ SOLN
0.2500 mg | INTRAMUSCULAR | Status: DC | PRN
  Filled 2013-07-04: qty 1

## 2013-07-04 MED ORDER — OXYCODONE HCL 5 MG PO TABS
5.0000 mg | ORAL_TABLET | Freq: Once | ORAL | Status: DC | PRN
  Filled 2013-07-04: qty 1

## 2013-07-04 MED ORDER — PROPOFOL 10 MG/ML IV BOLUS
INTRAVENOUS | Status: DC | PRN
  Administered 2013-07-04: 180 mg via INTRAVENOUS

## 2013-07-04 MED ORDER — HYDROCODONE-ACETAMINOPHEN 10-325 MG PO TABS: 1.0000 | ORAL_TABLET | ORAL | Status: DC | PRN

## 2013-07-04 MED ORDER — CIPROFLOXACIN HCL 500 MG PO TABS: 500.0000 mg | ORAL_TABLET | Freq: Two times a day (BID) | ORAL | Status: DC

## 2013-07-04 MED ORDER — PROMETHAZINE HCL 25 MG/ML IJ SOLN
6.2500 mg | INTRAMUSCULAR | Status: DC | PRN
  Filled 2013-07-04: qty 1

## 2013-07-04 MED ORDER — STERILE WATER FOR IRRIGATION IR SOLN
Status: DC | PRN
  Administered 2013-07-04: 1000 mL

## 2013-07-04 MED ORDER — LACTATED RINGERS IV SOLN
INTRAVENOUS | Status: DC
  Administered 2013-07-04 (×2): via INTRAVENOUS
  Filled 2013-07-04: qty 1000

## 2013-07-04 MED ORDER — IOHEXOL 350 MG/ML SOLN
INTRAVENOUS | Status: DC | PRN
  Administered 2013-07-04: 6 mL via INTRAVENOUS

## 2013-07-04 MED ORDER — FENTANYL CITRATE 0.05 MG/ML IJ SOLN
INTRAMUSCULAR | Status: DC | PRN
  Administered 2013-07-04: 25 ug via INTRAVENOUS
  Administered 2013-07-04: 50 ug via INTRAVENOUS
  Administered 2013-07-04: 25 ug via INTRAVENOUS

## 2013-07-04 MED ORDER — FENTANYL CITRATE 0.05 MG/ML IJ SOLN
INTRAMUSCULAR | Status: AC
  Filled 2013-07-04: qty 6

## 2013-07-04 MED ORDER — EPHEDRINE SULFATE 50 MG/ML IJ SOLN
INTRAMUSCULAR | Status: DC | PRN
  Administered 2013-07-04 (×2): 10 mg via INTRAVENOUS

## 2013-07-04 MED ORDER — MEPERIDINE HCL 25 MG/ML IJ SOLN
6.2500 mg | INTRAMUSCULAR | Status: DC | PRN
  Filled 2013-07-04: qty 1

## 2013-07-04 SURGICAL SUPPLY — 24 items
BAG URINE DRAINAGE (UROLOGICAL SUPPLIES) ×3 IMPLANT
BLADE SURG ROTATE 9660 (MISCELLANEOUS) ×3 IMPLANT
CATH FOLEY 2WAY SLVR  5CC 16FR (CATHETERS) ×2
CATH FOLEY 2WAY SLVR 5CC 16FR (CATHETERS) ×4 IMPLANT
CATH ROBINSON RED A/P 20FR (CATHETERS) ×3 IMPLANT
CLOTH BEACON ORANGE TIMEOUT ST (SAFETY) ×3 IMPLANT
COVER MAYO STAND STRL (DRAPES) ×3 IMPLANT
COVER TABLE BACK 60X90 (DRAPES) ×3 IMPLANT
DRSG TEGADERM 4X4.75 (GAUZE/BANDAGES/DRESSINGS) ×3 IMPLANT
DRSG TEGADERM 8X12 (GAUZE/BANDAGES/DRESSINGS) ×3 IMPLANT
GLOVE BIO SURGEON STRL SZ8 (GLOVE) ×9 IMPLANT
GLOVE ECLIPSE 8.0 STRL XLNG CF (GLOVE) ×12 IMPLANT
GOWN STRL REUS W/ TWL LRG LVL3 (GOWN DISPOSABLE) ×2 IMPLANT
GOWN STRL REUS W/ TWL XL LVL3 (GOWN DISPOSABLE) ×2 IMPLANT
GOWN STRL REUS W/TWL LRG LVL3 (GOWN DISPOSABLE) ×1
GOWN STRL REUS W/TWL XL LVL3 (GOWN DISPOSABLE) ×1
HOLDER FOLEY CATH W/STRAP (MISCELLANEOUS) ×3 IMPLANT
PACK CYSTOSCOPY (CUSTOM PROCEDURE TRAY) ×3 IMPLANT
Radioactive Seeds ×171 IMPLANT
SPONGE GAUZE 4X4 12PLY (GAUZE/BANDAGES/DRESSINGS) ×3 IMPLANT
SYRINGE 10CC LL (SYRINGE) ×3 IMPLANT
UNDERPAD 30X30 INCONTINENT (UNDERPADS AND DIAPERS) ×6 IMPLANT
WATER STERILE IRR 3000ML UROMA (IV SOLUTION) ×3 IMPLANT
WATER STERILE IRR 500ML POUR (IV SOLUTION) ×3 IMPLANT

## 2013-07-04 NOTE — Transfer of Care (Signed)
Immediate Anesthesia Transfer of Care Note  Patient: [name]  Procedure(s) Performed: Procedure(s) (LRB): RADIOACTIVE SEED IMPLANT (N/A) CYSTOSCOPY FLEXIBLE (N/A)  Patient Location: PACU  Anesthesia Type: General  Level of Consciousness: awake, oriented, sedated and patient cooperative  Airway & Oxygen Therapy: Patient Spontanous Breathing and Patient connected to face mask oxygen  Post-op Assessment: Report given to PACU RN and Post -op Vital signs reviewed and stable  Post vital signs: Reviewed and stable  Complications: No apparent anesthesia complications

## 2013-07-04 NOTE — Anesthesia Preprocedure Evaluation (Signed)
Anesthesia Evaluation  Patient identified by MRN, date of birth, ID band Patient awake    Reviewed: Allergy & Precautions, H&P , NPO status , Patient's Chart, lab work & pertinent test results  Airway Mallampati: II TM Distance: >3 FB Neck ROM: Full    Dental  (+) Dental Advisory Given   Pulmonary former smoker,  breath sounds clear to auscultation        Cardiovascular hypertension, Pt. on medications Rhythm:Regular Rate:Normal     Neuro/Psych negative neurological ROS  negative psych ROS   GI/Hepatic negative GI ROS, Neg liver ROS, GERD-  Medicated,  Endo/Other  negative endocrine ROSdiabetes, Type 2, Oral Hypoglycemic Agents, Insulin Dependent  Renal/GU negative Renal ROS  negative genitourinary   Musculoskeletal negative musculoskeletal ROS (+)   Abdominal (+) + obese,   Peds negative pediatric ROS (+)  Hematology negative hematology ROS (+)   Anesthesia Other Findings   Reproductive/Obstetrics                           Anesthesia Physical Anesthesia Plan  ASA: III  Anesthesia Plan: General   Post-op Pain Management:    Induction: Intravenous  Airway Management Planned: Oral ETT  Additional Equipment:   Intra-op Plan:   Post-operative Plan: Extubation in OR  Informed Consent: I have reviewed the patients History and Physical, chart, labs and discussed the procedure including the risks, benefits and alternatives for the proposed anesthesia with the patient or authorized representative who has indicated his/her understanding and acceptance.   Dental advisory given  Plan Discussed with: CRNA  Anesthesia Plan Comments:         Anesthesia Quick Evaluation

## 2013-07-04 NOTE — Progress Notes (Signed)
Enema completed by patient at home

## 2013-07-04 NOTE — Anesthesia Procedure Notes (Signed)
Procedure Name: LMA Insertion Date/Time: 07/04/2013 8:52 AM Performed by: Denna Haggard D Pre-anesthesia Checklist: Patient identified, Emergency Drugs available, Suction available and Patient being monitored Patient Re-evaluated:Patient Re-evaluated prior to inductionOxygen Delivery Method: Circle System Utilized Preoxygenation: Pre-oxygenation with 100% oxygen Intubation Type: IV induction Ventilation: Mask ventilation without difficulty LMA: LMA inserted LMA Size: 5.0 Number of attempts: 1 Airway Equipment and Method: bite block Placement Confirmation: positive ETCO2 Tube secured with: Tape Dental Injury: Teeth and Oropharynx as per pre-operative assessment

## 2013-07-04 NOTE — Procedures (Signed)
  Radiation Oncology         [phone] ________________________________  Name: [name] MRN: [id number]  Date: 07/04/2013  DOB: [birth year]       Prostate Seed Implant  CY:ELYH,TMBPJPE, MD  No ref. provider found  DIAGNOSIS: 73 y.o. gentleman with stage T1c adenocarcinoma of the prostate with a Gleason's score of 4+3 and a PSA of 4.09  PROCEDURE: Insertion of radioactive I-125 seeds into the prostate gland.  RADIATION DOSE: 110 Gy, boost therapy.  TECHNIQUE: SAW MENDENHALL was brought to the operating room with the urologist. He was placed in the dorsolithotomy position. He was catheterized and a rectal tube was inserted. The perineum was shaved, prepped and draped. The ultrasound probe was then introduced into the rectum to see the prostate gland.  TREATMENT DEVICE: A needle grid was attached to the ultrasound probe stand and anchor needles were placed.  3D PLANNING: The prostate was imaged in 3D using a sagittal sweep of the prostate probe. These images were transferred to the planning computer. There, the prostate, urethra and rectum were defined on each axial reconstructed image. Then, the software created an optimized 3D plan and a few seed positions were adjusted. The quality of the plan was reviewed using [facility] information for the target and the following two organs at risk:  Urethra and Rectum.  Then the accepted plan was uploaded to the seed Selectron afterloading unit.  PROSTATE VOLUME STUDY:  Using transrectal ultrasound the volume of the prostate was verified to be 42.37 cc.  SPECIAL TREATMENT PROCEDURE/SUPERVISION AND HANDLING: The Nucletron FIRST system was used to place the needles under sagittal guidance. A total of 23 needles were used to deposit 57 seeds in the prostate gland. The individual seed activity was 0.503 mCi for a total implant activity of 28.671 mCi.  COMPLEX SIMULATION: At the end of the procedure, an anterior radiograph of the pelvis was obtained  to document seed positioning and count. Cystoscopy was performed to check the urethra and bladder.  MICRODOSIMETRY: At the end of the procedure, the patient was emitting 0.07 mrem/hr at 1 meter. Accordingly, he was considered safe for hospital discharge.  PLAN: The patient will return to the radiation oncology clinic for post implant CT dosimetry in three weeks.   ________________________________  Sheral Apley Tammi Klippel, M.D.

## 2013-07-04 NOTE — Op Note (Signed)
PATIENT:  [name]  PRE-OPERATIVE DIAGNOSIS:  Adenocarcinoma of the prostate  POST-OPERATIVE DIAGNOSIS:  Same  PROCEDURE:  Procedure(s): 1. [name]-125 radioactive seed implantation 2. Cystoscopy  SURGEON:  Surgeon(s): Claybon Jabs  Radiation oncologist: Dr. Tyler Pita  ANESTHESIA:  General  EBL:  Minimal  DRAINS: 45 French Foley catheter  INDICATION: [name] presents today for radioactive seed boostis a 73 year old male with biopsy-proven adenocarcinoma of the prostate Gleason 4+3 = 7 in 1 core, 3+4 = 7 in a second core and 3+3= 6 in a third core. He underwent neoadjuvant Lupron injection followed by IMRT and presented today for radioactive seed boost.  Description of procedure: After informed consent the patient was brought to the major OR, placed on the table and administered general anesthesia. He was then moved to the modified lithotomy position with his perineum perpendicular to the floor. His perineum and genitalia were then sterilely prepped. An official timeout was then performed. A 16 French Foley catheter was then placed in the bladder and filled with dilute contrast, a rectal tube was placed in the rectum and the transrectal ultrasound probe was placed in the rectum and affixed to the stand. He was then sterilely draped.  Real time ultrasonography was used along with the seed planning software spot-pro version 3.1-00. This was used to develop the seed plan including the number of needles as well as number of seeds required for complete and adequate coverage. Real-time ultrasonography was then used along with the previously developed plan and the Nucletron device to implant a total of 57 seeds using 23 needles. This proceeded without difficulty or complication.  A Foley catheter was then removed as well as the transrectal ultrasound probe and rectal probe. Flexible cystoscopy was then performed using the 17 French flexible scope which revealed a normal  urethra throughout its length down to the sphincter which appeared intact. The prostatic urethra revealed bilobar hypertrophy but no evidence of obstruction, seeds, spacers or lesions. The bladder was then entered and fully and systematically inspected. The ureteral orifices were noted to be of normal configuration and position. The mucosa revealed no evidence of tumors. There were also no stones identified within the bladder. [name] noted no seeds or spacers on the floor of the bladder and retroflexion of the scope revealed no seeds protruding from the base of the prostate.  The cystoscope was then removed and a new 72 French Foley catheter was then inserted and the balloon was filled with 10 cc of sterile water. This was connected to closed system drainage and the patient was awakened and taken to recovery room in stable and satisfactory condition. He tolerated procedure well and there were no intraoperative complications.

## 2013-07-04 NOTE — Anesthesia Postprocedure Evaluation (Signed)
Anesthesia Post Note  Patient: [name]  Procedure(s) Performed: Procedure(s) (LRB): RADIOACTIVE SEED IMPLANT (N/A) CYSTOSCOPY FLEXIBLE (N/A)  Anesthesia type: General  Patient location: PACU  Post pain: Pain level controlled  Post assessment: Post-op Vital signs reviewed  Last Vitals: BP 150/86  Pulse 76  Temp(Src) 36.1 C (Oral)  Resp 16  Ht 5\' 11"  (1.803 m)  Wt 226 lb (102.513 kg)  BMI 31.53 kg/m2  SpO2 96%  Post vital signs: Reviewed  Level of consciousness: sedated  Complications: No apparent anesthesia complications

## 2013-07-04 NOTE — Discharge Instructions (Signed)
DISCHARGE INSTRUCTIONS FOR PROSTATE SEED IMPLANTATION  Removal of catheter Remove the foley catheter after 24 hours ( day after the procedure).can be done easily by cutting the side port of the catheter, whichallow the balloon to deflate.  You will see 1-2 teaspoons of clear water as the balloon deflates and then the catheter can be slid out without difficulty.        Cut here  Antibiotics You may be given a prescription for an antibiotic to take when you arrive home. If so, be sure to take every tablet in the bottle, even if you are feeling better before the prescription is finished. If you begin itching, notice a rash or start to swell on your trunk, arms, legs and/or throat, immediately stop taking the antibiotic and call your Urologist. Diet Resume your usual diet when you return home. To keep your bowels moving easily and softly, drink prune, apple and cranberry juice at room temperature. You may also take a stool softener, such as Colace, which is available without prescription at local pharmacies. Daily activities   No driving or heavy lifting for at least two days after the implant.   No bike riding, horseback riding or riding lawn mowers for the first month after the implant.   Any strenuous physical activity should be approved by your doctor before you resume it. Sexual relations You may resume sexual relations two weeks after the procedure. A condom should be used for the first two weeks. Your semen may be dark brown or black; this is normal and is related bleeding that may have occurred during the implant. Postoperative swelling Expect swelling and bruising of the scrotum and perineum (the area between the scrotum and anus). Both the swelling and the bruising should resolve in l or 2 weeks. Ice packs and over- the-counter medications such as Tylenol, Advil or Aleve may lessen your discomfort. Postoperative urination Most men experience burning on urination and/or urinary frequency.  If this becomes bothersome, contact your Urologist.  Medication can be prescribed to relieve these problems.  It is normal to have some blood in your urine for a few days after the implant. Special instructions related to the seeds It is unlikely that you will pass an Iodine-125 seed in your urine. The seeds are silver in color and are about as large as a grain of rice. If you pass a seed, do not handle it with your fingers. Use a spoon to place it in an envelope or jar in place this in base occluded area such as the garage or basement for return to the radiation clinic at your convenience.  Contact your doctor for   Temperature greater than 101 F   Increasing pain   Inability to urinate Follow-up  You should have follow up with your urologist and radiation oncologist about 3 weeks after the procedure. General information regarding Iodine seeds   Iodine-125 is a low energy radioactive material. It is not deeply penetrating and loses energy at short distances. Your prostate will absorb the radiation. Objects that are touched or used by the patient do not become radioactive.   Body wastes (urine and stool) or body fluids (saliva, tears, semen or blood) are not radioactive.   The Nuclear Regulatory Commission [facility]) has determined that no radiation precautions are needed for patients undergoing Iodine-125 seed implantation. The [facility] states that such patients do not present a risk to the people around them, including young children and pregnant women. However, in keeping with the general principle  that radiation exposure should be kept as low reasonably possible, we suggest the following:   Children and pets should not sit on the patient's lap for the first two (2) weeks after the implant.   Pregnant (or possibly pregnant) women should avoid prolonged, close contact with the patient for the first two (2) weeks after the implant.   A distance of three (3) feet is acceptable. At a distance of three (3)  feet, there is no limit to the length of time anyone can be with the patient. Post Anesthesia Home Care Instructions  Activity: Get plenty of rest for the remainder of the day. A responsible adult should stay with you for 24 hours following the procedure.  For the next 24 hours, DO NOT: -Drive a car -Paediatric nurse -Drink alcoholic beverages -Take any medication unless instructed by your physician -Make any legal decisions or sign important papers.  Meals: Start with liquid foods such as gelatin or soup. Progress to regular foods as tolerated. Avoid greasy, spicy, heavy foods. If nausea and/or vomiting occur, drink only clear liquids until the nausea and/or vomiting subsides. Call your physician if vomiting continues.  Special Instructions/Symptoms: Your throat may feel dry or sore from the anesthesia or the breathing tube placed in your throat during surgery. If this causes discomfort, gargle with warm salt water. The discomfort should disappear within 24 hours.

## 2013-07-05 ENCOUNTER — Encounter (HOSPITAL_BASED_OUTPATIENT_CLINIC_OR_DEPARTMENT_OTHER): Payer: Self-pay | Admitting: Urology

## 2013-07-11 ENCOUNTER — Ambulatory Visit: Payer: Medicare Other | Admitting: Radiation Oncology

## 2013-07-15 NOTE — Progress Notes (Signed)
  Radiation Oncology         [phone] ________________________________  Name: [name] MRN: [id number]  Date: 05/03/2013  DOB: [birth year]  Simulation Verification Note  Status: outpatient  NARRATIVE: The patient was brought to the treatment unit and placed in the planned treatment position. The clinical setup was verified. Then port films were obtained and uploaded to the radiation oncology medical record software.  The treatment beams were carefully compared against the planned radiation fields. The position location and shape of the radiation fields was reviewed. They targeted volume of tissue appears to be appropriately covered by the radiation beams. Organs at risk appear to be excluded as planned.  Based on my personal review, I approved the simulation verification. The patient's treatment will proceed as planned.  ------------------------------------------------  Sheral Apley Tammi Klippel, M.D.

## 2013-07-19 ENCOUNTER — Telehealth: Payer: Self-pay | Admitting: Internal Medicine

## 2013-07-19 NOTE — Telephone Encounter (Signed)
Spoke with patient and he received a recall colonoscopy letter. He had prostate seed implants. His oncologist recommends that he not have a colonoscopy this year.

## 2013-07-19 NOTE — Telephone Encounter (Signed)
Please change the recall date to 08/2014. Thanx

## 2013-07-19 NOTE — Telephone Encounter (Signed)
Recall date changed as per MD.

## 2013-07-24 ENCOUNTER — Telehealth: Payer: Self-pay | Admitting: *Deleted

## 2013-07-24 NOTE — Telephone Encounter (Signed)
Called patient to remind of appts. For 07-25-13, lvm for a return call

## 2013-07-25 ENCOUNTER — Encounter: Payer: Self-pay | Admitting: Radiation Oncology

## 2013-07-25 ENCOUNTER — Ambulatory Visit
Admit: 2013-07-25 | Discharge: 2013-07-25 | Disposition: A | Discharge status: Home / Self Care | Payer: Medicare Other | Attending: Radiation Oncology | Admitting: Radiation Oncology

## 2013-07-25 ENCOUNTER — Ambulatory Visit
Admission: RE | Admit: 2013-07-25 | Discharge: 2013-07-25 | Disposition: A | Discharge status: Home / Self Care | Payer: Medicare Other | Source: Ambulatory Visit | Attending: Radiation Oncology | Admitting: Radiation Oncology

## 2013-07-25 VITALS — BP 119/83 | HR 85 | Temp 97.5°F | Ht 71.0 in | Wt 236.3 lb

## 2013-07-25 DIAGNOSIS — C61 Malignant neoplasm of prostate: Secondary | ICD-10-CM | POA: Diagnosis not present

## 2013-07-25 DIAGNOSIS — Z510 Encounter for antineoplastic radiation therapy: Secondary | ICD-10-CM | POA: Insufficient documentation

## 2013-07-25 NOTE — Progress Notes (Signed)
  Radiation Oncology         [phone] ________________________________  Name: [name] MRN: [id number]  Date: 07/25/2013  DOB: 1940/07/12  COMPLEX SIMULATION NOTE  NARRATIVE:  The patient was brought to the [place] suite today following prostate seed implantation approximately one month ago.  Identity was confirmed.  All relevant records and images related to the planned course of therapy were reviewed.  Then, the patient was set-up supine.  CT images were obtained.  The CT images were loaded into the planning software.  Then the prostate and rectum were contoured.  Treatment planning then occurred.  The implanted iodine 125 seeds were identified by the physics staff for projection of radiation distribution  I have requested : 3D Simulation  I have requested a DVH of the following structures: Prostate and rectum.    ________________________________  Sheral Apley Tammi Klippel, M.D.

## 2013-07-25 NOTE — Progress Notes (Signed)
Marvel Plan here for post seed follow up.  He denies pain, dysuria, hematuria, trouble emptying his bladder and diarrhea/constipation.  He reports noticing an increase in urinary frequency and gets up 1-2 times per night to urinate depending.  IPSS score today is 8.

## 2013-07-25 NOTE — Progress Notes (Signed)
Radiation Oncology         ([phone]) [phone] ________________________________  Name: [name] MRN: [id number]  Date: 07/25/2013  DOB: [birth year]  Follow-Up Visit Note  CC: Delrae Rend, MD  Claybon Jabs, MD  Diagnosis:   73 y.o. gentleman with stage T1c adenocarcinoma of the prostate with a Gleason's score of 4+3 and a PSA of 4.09  Interval Since Last Radiation:  3  weeks  Narrative:  The patient returns today for routine follow-up.  He is complaining of increased urinary frequency and urinary hesitation symptoms. He filled out a questionnaire regarding urinary function today providing and overall IPSS score of 8 characterizing his symptoms as moderate.  His pre-implant score was 1 . He denies any bowel symptoms.  ALLERGIES:  has No Known Allergies.  Meds: Current Outpatient Prescriptions  Medication Sig Dispense Refill  . aspirin 81 MG tablet Take 81 mg by mouth daily.      Marland Kitchen atorvastatin (LIPITOR) 40 MG tablet Take 40 mg by mouth every morning.       . Cholecalciferol (VITAMIN D3) 1000 UNITS CAPS Take 1 capsule by mouth daily.      . insulin glargine (LANTUS) 100 UNIT/ML injection Inject 46 Units into the skin every morning.       . metFORMIN (GLUCOPHAGE) 1000 MG tablet Take 1,000 mg by mouth 2 (two) times daily with a meal.       . Omega 3 1000 MG CAPS Take 1 capsule by mouth daily.      Marland Kitchen omeprazole (PRILOSEC) 20 MG capsule Take 20 mg by mouth as needed.      . ramipril (ALTACE) 10 MG capsule Take 10 mg by mouth every morning.       . sitaGLIPtin (JANUVIA) 100 MG tablet Take 100 mg by mouth every morning.       . ciprofloxacin (CIPRO) 500 MG tablet Take 1 tablet (500 mg total) by mouth 2 (two) times daily.  6 tablet  0  . HYDROcodone-acetaminophen (NORCO) 10-325 MG per tablet Take 1-2 tablets by mouth every 4 (four) hours as needed for moderate pain. Maximum dose per 24 hours - 8 pills  30 tablet  0   No current facility-administered medications for this encounter.     Physical Findings: The patient is in no acute distress. Patient is alert and oriented.  height is 5\' 11"  (1.803 m) and weight is 236 lb 4.8 oz (107.185 kg). His oral temperature is 97.5 F (36.4 C). His blood pressure is 119/83 and his pulse is 85. .  No significant changes.  Lab Findings: Lab Results  Component Value Date   WBC 5.0 06/28/2013   HGB 14.2 06/28/2013   HCT 42.3 06/28/2013   MCV 76.8* 06/28/2013   PLT 155 06/28/2013    Radiographic Findings:  Patient underwent CT imaging in our clinic for post implant dosimetry. The CT appears to demonstrate an adequate distribution of radioactive seeds throughout the prostate gland. There no seeds in her near the rectum. I suspect the final radiation plan and dosimetry will show appropriate coverage of the prostate gland.   Impression: The patient is recovering from the effects of radiation. His urinary symptoms should gradually improve over the next 4-6 months. We talked about this today. He is encouraged by his improvement already and is otherwise please with his outcome.   Plan: Today, I spent time talking to the patient about his prostate seed implant and resolving urinary symptoms. We also talked about long-term follow-up for  prostate cancer following seed implant. He understands that ongoing PSA determinations and digital rectal exams will help perform surveillance to rule out disease recurrence. He understands what to expect with his PSA measures. Patient was also educated today about some of the long-term effects from radiation including a small risk for rectal bleeding and possibly erectile dysfunction. We talked about some of the general management approaches to these potential complications. However, I did encourage the patient to contact our office or return at any point if he has questions or concerns related to his previous radiation and prostate cancer.  _____________________________________  Sheral Apley. Tammi Klippel, M.D.

## 2013-08-15 ENCOUNTER — Ambulatory Visit: Payer: Medicare Other | Admitting: Radiation Oncology

## 2013-09-12 ENCOUNTER — Encounter: Payer: Self-pay | Admitting: Radiation Oncology

## 2013-09-12 DIAGNOSIS — Z510 Encounter for antineoplastic radiation therapy: Secondary | ICD-10-CM | POA: Diagnosis not present

## 2013-10-17 NOTE — Progress Notes (Signed)
  Radiation Oncology         ([phone]) [phone] ________________________________  Name: [name] MRN: [id number]  Date: 09/12/2013  DOB: Oct 19, 1940  Complex Isodose Planning Note Prostate Brachytherapy  Diagnosis: 73 y.o. gentleman with stage T1c adenocarcinoma of the prostate with a Gleason's score of 4+3 and a PSA of 4.09  Narrative: On a previous date, [name] returned following prostate seed implantation for post implant planning. He underwent CT scan complex simulation to delineate the three-dimensional structures of the pelvis and demonstrate the radiation distribution.  Since that time, the seed localization, and complex isodose planning with dose volume histograms have now been completed.  Results:   Prostate Coverage - The dose of radiation delivered to the 90% or more of the prostate gland (D90) was 109.44% of the prescription dose. This exceeds our goal of greater than 90%. Rectal Sparing - The volume of rectal tissue receiving the prescription dose or higher was 0.69 cc. This falls under our thresholds tolerance of 1.0 cc.  Impression: The prostate seed implant appears to show adequate target coverage and appropriate rectal sparing.  Plan:  The patient will continue to follow with urology for ongoing PSA determinations. I would anticipate a high likelihood for local tumor control with minimal risk for rectal morbidity.  ________________________________  Sheral Apley Tammi Klippel, M.D.

## 2014-06-25 ENCOUNTER — Encounter: Payer: Self-pay | Admitting: Internal Medicine

## 2014-08-14 ENCOUNTER — Encounter: Payer: Self-pay | Admitting: Internal Medicine

## 2014-08-15 ENCOUNTER — Ambulatory Visit (AMBULATORY_SURGERY_CENTER): Payer: Self-pay

## 2014-08-15 VITALS — Ht 71.0 in | Wt 235.4 lb

## 2014-08-15 DIAGNOSIS — Z1211 Encounter for screening for malignant neoplasm of colon: Secondary | ICD-10-CM

## 2014-08-15 MED ORDER — MOVIPREP 100 G PO SOLR: ORAL | Status: DC

## 2014-08-15 NOTE — Progress Notes (Signed)
Per pt, no allergies to soy or egg products.Pt not taking any weight loss meds or using  O2 at home. 

## 2014-08-29 ENCOUNTER — Encounter: Payer: BLUE CROSS/BLUE SHIELD | Admitting: Internal Medicine

## 2014-12-12 ENCOUNTER — Other Ambulatory Visit: Payer: Self-pay | Admitting: Internal Medicine

## 2014-12-12 ENCOUNTER — Encounter (HOSPITAL_BASED_OUTPATIENT_CLINIC_OR_DEPARTMENT_OTHER): Payer: Medicare Other | Attending: Internal Medicine

## 2014-12-12 ENCOUNTER — Ambulatory Visit (HOSPITAL_COMMUNITY)
Admission: RE | Admit: 2014-12-12 | Discharge: 2014-12-12 | Disposition: A | Discharge status: Home / Self Care | Payer: Medicare Other | Source: Ambulatory Visit | Attending: Internal Medicine | Admitting: Internal Medicine

## 2014-12-12 DIAGNOSIS — Y842 Radiological procedure and radiotherapy as the cause of abnormal reaction of the patient, or of later complication, without mention of misadventure at the time of the procedure: Secondary | ICD-10-CM | POA: Insufficient documentation

## 2014-12-12 DIAGNOSIS — E119 Type 2 diabetes mellitus without complications: Secondary | ICD-10-CM | POA: Diagnosis not present

## 2014-12-12 DIAGNOSIS — N3040 Irradiation cystitis without hematuria: Secondary | ICD-10-CM

## 2014-12-12 DIAGNOSIS — N3041 Irradiation cystitis with hematuria: Secondary | ICD-10-CM | POA: Insufficient documentation

## 2014-12-12 DIAGNOSIS — Z8546 Personal history of malignant neoplasm of prostate: Secondary | ICD-10-CM | POA: Insufficient documentation

## 2014-12-17 DIAGNOSIS — N3041 Irradiation cystitis with hematuria: Secondary | ICD-10-CM | POA: Diagnosis not present

## 2014-12-17 LAB — GLUCOSE, CAPILLARY: Glucose-Capillary: 92 mg/dL (ref 65–99)

## 2014-12-18 DIAGNOSIS — Z8546 Personal history of malignant neoplasm of prostate: Secondary | ICD-10-CM | POA: Diagnosis not present

## 2014-12-18 DIAGNOSIS — N3041 Irradiation cystitis with hematuria: Secondary | ICD-10-CM | POA: Diagnosis not present

## 2014-12-18 DIAGNOSIS — E119 Type 2 diabetes mellitus without complications: Secondary | ICD-10-CM | POA: Diagnosis not present

## 2014-12-18 LAB — GLUCOSE, CAPILLARY
GLUCOSE-CAPILLARY: 158 mg/dL — AB (ref 65–99)
Glucose-Capillary: 126 mg/dL — ABNORMAL HIGH (ref 65–99)

## 2014-12-19 DIAGNOSIS — E119 Type 2 diabetes mellitus without complications: Secondary | ICD-10-CM | POA: Diagnosis not present

## 2014-12-19 DIAGNOSIS — Z8546 Personal history of malignant neoplasm of prostate: Secondary | ICD-10-CM | POA: Diagnosis not present

## 2014-12-19 DIAGNOSIS — N3041 Irradiation cystitis with hematuria: Secondary | ICD-10-CM | POA: Diagnosis not present

## 2014-12-19 LAB — GLUCOSE, CAPILLARY
GLUCOSE-CAPILLARY: 105 mg/dL — AB (ref 65–99)
Glucose-Capillary: 209 mg/dL — ABNORMAL HIGH (ref 65–99)

## 2014-12-22 DIAGNOSIS — E119 Type 2 diabetes mellitus without complications: Secondary | ICD-10-CM | POA: Diagnosis not present

## 2014-12-22 DIAGNOSIS — Z8546 Personal history of malignant neoplasm of prostate: Secondary | ICD-10-CM | POA: Diagnosis not present

## 2014-12-22 DIAGNOSIS — N3041 Irradiation cystitis with hematuria: Secondary | ICD-10-CM | POA: Diagnosis not present

## 2014-12-22 LAB — GLUCOSE, CAPILLARY
GLUCOSE-CAPILLARY: 133 mg/dL — AB (ref 65–99)
Glucose-Capillary: 119 mg/dL — ABNORMAL HIGH (ref 65–99)

## 2014-12-28 ENCOUNTER — Inpatient Hospital Stay (HOSPITAL_COMMUNITY)
Admission: EM | Admit: 2014-12-28 | Discharge: 2015-01-04 | DRG: 668 | Disposition: A | Discharge status: Home Health | Payer: Medicare Other | Attending: Internal Medicine | Admitting: Internal Medicine

## 2014-12-28 ENCOUNTER — Encounter (HOSPITAL_COMMUNITY): Payer: Self-pay | Admitting: Emergency Medicine

## 2014-12-28 DIAGNOSIS — R05 Cough: Secondary | ICD-10-CM

## 2014-12-28 DIAGNOSIS — R059 Cough, unspecified: Secondary | ICD-10-CM

## 2014-12-28 DIAGNOSIS — Z923 Personal history of irradiation: Secondary | ICD-10-CM

## 2014-12-28 DIAGNOSIS — R319 Hematuria, unspecified: Secondary | ICD-10-CM | POA: Diagnosis not present

## 2014-12-28 DIAGNOSIS — N3041 Irradiation cystitis with hematuria: Principal | ICD-10-CM | POA: Diagnosis present

## 2014-12-28 DIAGNOSIS — R310 Gross hematuria: Secondary | ICD-10-CM | POA: Diagnosis present

## 2014-12-28 DIAGNOSIS — Z801 Family history of malignant neoplasm of trachea, bronchus and lung: Secondary | ICD-10-CM

## 2014-12-28 DIAGNOSIS — Z79899 Other long term (current) drug therapy: Secondary | ICD-10-CM

## 2014-12-28 DIAGNOSIS — Z23 Encounter for immunization: Secondary | ICD-10-CM

## 2014-12-28 DIAGNOSIS — Z8249 Family history of ischemic heart disease and other diseases of the circulatory system: Secondary | ICD-10-CM

## 2014-12-28 DIAGNOSIS — N3289 Other specified disorders of bladder: Secondary | ICD-10-CM | POA: Diagnosis present

## 2014-12-28 DIAGNOSIS — D62 Acute posthemorrhagic anemia: Secondary | ICD-10-CM | POA: Diagnosis present

## 2014-12-28 DIAGNOSIS — Z803 Family history of malignant neoplasm of breast: Secondary | ICD-10-CM

## 2014-12-28 DIAGNOSIS — N029 Recurrent and persistent hematuria with unspecified morphologic changes: Secondary | ICD-10-CM | POA: Diagnosis present

## 2014-12-28 DIAGNOSIS — Z7982 Long term (current) use of aspirin: Secondary | ICD-10-CM

## 2014-12-28 DIAGNOSIS — Z87891 Personal history of nicotine dependence: Secondary | ICD-10-CM

## 2014-12-28 DIAGNOSIS — I10 Essential (primary) hypertension: Secondary | ICD-10-CM | POA: Diagnosis present

## 2014-12-28 DIAGNOSIS — K219 Gastro-esophageal reflux disease without esophagitis: Secondary | ICD-10-CM | POA: Diagnosis present

## 2014-12-28 DIAGNOSIS — Z8042 Family history of malignant neoplasm of prostate: Secondary | ICD-10-CM

## 2014-12-28 DIAGNOSIS — J189 Pneumonia, unspecified organism: Secondary | ICD-10-CM | POA: Diagnosis present

## 2014-12-28 DIAGNOSIS — E119 Type 2 diabetes mellitus without complications: Secondary | ICD-10-CM | POA: Diagnosis present

## 2014-12-28 DIAGNOSIS — Z66 Do not resuscitate: Secondary | ICD-10-CM | POA: Diagnosis present

## 2014-12-28 DIAGNOSIS — Y842 Radiological procedure and radiotherapy as the cause of abnormal reaction of the patient, or of later complication, without mention of misadventure at the time of the procedure: Secondary | ICD-10-CM | POA: Diagnosis present

## 2014-12-28 DIAGNOSIS — Z794 Long term (current) use of insulin: Secondary | ICD-10-CM

## 2014-12-28 DIAGNOSIS — E785 Hyperlipidemia, unspecified: Secondary | ICD-10-CM | POA: Diagnosis present

## 2014-12-28 DIAGNOSIS — C61 Malignant neoplasm of prostate: Secondary | ICD-10-CM | POA: Diagnosis present

## 2014-12-28 DIAGNOSIS — M1990 Unspecified osteoarthritis, unspecified site: Secondary | ICD-10-CM | POA: Diagnosis present

## 2014-12-28 LAB — URINALYSIS, ROUTINE W REFLEX MICROSCOPIC
Glucose, UA: >1000 mg/dL — AB
KETONES UR: 40 mg/dL — AB
NITRITE: POSITIVE — AB
PH: 5 (ref 5.0–8.0)
Protein, ur: >300 mg/dL — AB
SPECIFIC GRAVITY, URINE: 1.021 (ref 1.005–1.030)

## 2014-12-28 LAB — URINE MICROSCOPIC-ADD ON
BACTERIA UA: NONE SEEN
Squamous Epithelial / LPF: NONE SEEN

## 2014-12-28 LAB — BASIC METABOLIC PANEL
ANION GAP: 12 (ref 5–15)
BUN: 20 mg/dL (ref 6–20)
CO2: 22 mmol/L (ref 22–32)
Calcium: 9.1 mg/dL (ref 8.9–10.3)
Chloride: 100 mmol/L — ABNORMAL LOW (ref 101–111)
Creatinine, Ser: 1.09 mg/dL (ref 0.61–1.24)
GFR calc Af Amer: >60 mL/min (ref >60)
Glucose, Bld: 207 mg/dL — ABNORMAL HIGH (ref 65–99)
POTASSIUM: 4.6 mmol/L (ref 3.5–5.1)
SODIUM: 134 mmol/L — AB (ref 135–145)

## 2014-12-28 LAB — GLUCOSE, CAPILLARY: GLUCOSE-CAPILLARY: 182 mg/dL — AB (ref 65–99)

## 2014-12-28 LAB — CBC
HEMATOCRIT: 21.3 % — AB (ref 39.0–52.0)
HEMOGLOBIN: 6.9 g/dL — AB (ref 13.0–17.0)
MCH: 25.5 pg — ABNORMAL LOW (ref 26.0–34.0)
MCHC: 32.4 g/dL (ref 30.0–36.0)
MCV: 78.6 fL (ref 78.0–100.0)
Platelets: 272 10*3/uL (ref 150–400)
RBC: 2.71 MIL/uL — ABNORMAL LOW (ref 4.22–5.81)
RDW: 16.3 % — AB (ref 11.5–15.5)
WBC: 11.5 10*3/uL — AB (ref 4.0–10.5)

## 2014-12-28 LAB — APTT: APTT: 28 s (ref 24–37)

## 2014-12-28 LAB — PROTIME-INR
INR: 1.08 (ref 0.00–1.49)
Prothrombin Time: 14.2 seconds (ref 11.6–15.2)

## 2014-12-28 LAB — PREPARE RBC (CROSSMATCH)

## 2014-12-28 LAB — ABO/RH: ABO/RH(D): B POS

## 2014-12-28 MED ORDER — SODIUM CHLORIDE 0.9 % IJ SOLN
3.0000 mL | Freq: Two times a day (BID) | INTRAMUSCULAR | Status: DC
  Administered 2014-12-28 – 2015-01-04 (×12): 3 mL via INTRAVENOUS

## 2014-12-28 MED ORDER — INFLUENZA VAC SPLIT QUAD 0.5 ML IM SUSY
0.5000 mL | PREFILLED_SYRINGE | INTRAMUSCULAR | Status: AC
  Administered 2014-12-29: 0.5 mL via INTRAMUSCULAR
  Filled 2014-12-28 (×2): qty 0.5

## 2014-12-28 MED ORDER — INSULIN ASPART 100 UNIT/ML ~~LOC~~ SOLN
0.0000 [IU] | Freq: Three times a day (TID) | SUBCUTANEOUS | Status: DC
  Administered 2014-12-29 – 2014-12-31 (×4): 1 [IU] via SUBCUTANEOUS
  Administered 2014-12-31 – 2015-01-01 (×4): 2 [IU] via SUBCUTANEOUS
  Administered 2015-01-01 – 2015-01-02 (×2): 1 [IU] via SUBCUTANEOUS
  Administered 2015-01-02: 2 [IU] via SUBCUTANEOUS
  Administered 2015-01-03: 1 [IU] via SUBCUTANEOUS
  Administered 2015-01-03 (×2): 2 [IU] via SUBCUTANEOUS
  Administered 2015-01-04: 1 [IU] via SUBCUTANEOUS
  Administered 2015-01-04: 2 [IU] via SUBCUTANEOUS

## 2014-12-28 MED ORDER — PANTOPRAZOLE SODIUM 40 MG PO TBEC
40.0000 mg | DELAYED_RELEASE_TABLET | Freq: Every day | ORAL | Status: DC
  Administered 2014-12-29 – 2015-01-04 (×7): 40 mg via ORAL
  Filled 2014-12-28 (×8): qty 1

## 2014-12-28 MED ORDER — INSULIN ASPART PROT & ASPART (70-30 MIX) 100 UNIT/ML ~~LOC~~ SUSP
40.0000 [IU] | Freq: Two times a day (BID) | SUBCUTANEOUS | Status: DC
  Administered 2014-12-29 (×2): 40 [IU] via SUBCUTANEOUS
  Administered 2014-12-30 (×2): 20 [IU] via SUBCUTANEOUS
  Filled 2014-12-28: qty 10

## 2014-12-28 MED ORDER — ATORVASTATIN CALCIUM 40 MG PO TABS
40.0000 mg | ORAL_TABLET | Freq: Every morning | ORAL | Status: DC
  Administered 2014-12-29 – 2015-01-04 (×7): 40 mg via ORAL
  Filled 2014-12-28 (×7): qty 1

## 2014-12-28 MED ORDER — SODIUM CHLORIDE 0.9 % IV SOLN: Freq: Once | INTRAVENOUS | Status: DC

## 2014-12-28 MED ORDER — MORPHINE SULFATE (PF) 2 MG/ML IV SOLN
1.0000 mg | INTRAVENOUS | Status: DC | PRN
  Administered 2014-12-28 – 2014-12-29 (×3): 1 mg via INTRAVENOUS
  Filled 2014-12-28 (×4): qty 1

## 2014-12-28 MED ORDER — RAMIPRIL 10 MG PO CAPS
10.0000 mg | ORAL_CAPSULE | Freq: Every morning | ORAL | Status: DC
  Administered 2014-12-29 – 2015-01-04 (×7): 10 mg via ORAL
  Filled 2014-12-28 (×7): qty 1

## 2014-12-28 NOTE — ED Notes (Signed)
Bed: YJ09 Expected date:  Expected time:  Means of arrival:  Comments: Triage 5

## 2014-12-28 NOTE — H&P (Signed)
History and Physical  [name]:[id number] DOB: 10/24/1940 DOA: 12/28/2014  PCP: Kandice Hams, MD   Chief Complaint: Hematuria  History of Present Illness:  - Patient is a 74 yo male with history of prostate cancer Gleason 4+3 s/p radiation in May 2015.  - He has been doing well until 5 weeks ago when he started having hematuria. He went to see his urologist. A cystoscopy was done and a foley catheter was inserted.  - But hematuria recurred a few days later and got worse over the last 2 weeks. He went back to his urologist 10 days ago and had the catheter replaced. The hematuria cleared up initially but recurred again with leaking of blood around it.  - He had symptoms of dysuria and frequency as well. No purulent discharge seen. No abdominal pain or GI symptoms N/V/D/C.  - He started hyperbaric oxygen therapy last week.  - He is here today for passing clots in his urine, bleeding around catheter and clogged catheter.  - He denies chest pain or dyspnea. No other complaints.  - He was seen by urology, irrigation is going with no more clots coming out in the catheter as I can tell and clearing up of his urine.     Review of Systems:  CONSTITUTIONAL:  No night sweats.  No fatigue, malaise, lethargy.  No fever or chills. Eyes:  No visual changes.  No eye pain.  No eye discharge.   ENT:    No epistaxis.  No sinus pain.  No sore throat.  No ear pain.  No congestion. RESPIRATORY:  No cough.  No wheeze.  No hemoptysis.  No shortness of breath. CARDIOVASCULAR:  No chest pains.  No palpitations. GASTROINTESTINAL:  No abdominal pain.  No nausea or vomiting.  No diarrhea or constipation.  No hematemesis.  No hematochezia.  No melena. GENITOURINARY:  +urgency.  +frequency.  +dysuria.  +hematuria.  +obstructive symptoms.  No discharge.  No pain.  ++abnormal bleeding. MUSCULOSKELETAL:  No musculoskeletal pain.  No joint swelling.  No arthritis. NEUROLOGICAL:  No confusion.  No  weakness. No headache. No seizure. PSYCHIATRIC:  No depression. No anxiety. No suicidal ideation. SKIN:  No rashes.  No lesions.  No wounds. ENDOCRINE:  No unexplained weight loss.  No polydipsia.  No polyuria.  No polyphagia. HEMATOLOGIC:  No anemia.  No purpura.  No petechiae.  No bleeding.  ALLERGIC AND IMMUNOLOGIC:  No pruritus.  No swelling Other:  Past Medical and Surgical History:   Past Medical History  Diagnosis Date  . Prostate cancer ([place]) 03/2013    had seed implant and radiation for 5 weeks  . Hyperlipidemia   . Hypertension   . Type 2 diabetes mellitus ([place])   . GERD (gastroesophageal reflux disease)   . Wears glasses   . Arthritis    Past Surgical History  Procedure Laterality Date  . Prostate biopsy    . Inguinal hernia repair Left 2005  . Repair chronic incarcerated recurrent left inguinal hernia  10-23-2008  . Colonoscopy  2010  . Radioactive seed implant N/A 07/04/2013    Procedure: RADIOACTIVE SEED IMPLANT;  Surgeon: Claybon Jabs, MD;  [facility];  Service: Urology;  Laterality: N/A;  . Cystoscopy N/A 07/04/2013    Procedure: CYSTOSCOPY FLEXIBLE;  Surgeon: Claybon Jabs, MD;  Location: [facility];  Service: Urology;  Laterality: N/A;    Social History:   reports that he quit smoking about 6 years ago.  His smoking use included Cigarettes. He has a 30 pack-year smoking history. He has never used smokeless tobacco. He reports that he does not drink alcohol or use illicit drugs.   No Known Allergies  Family History  Problem Relation Age of Onset  . Cancer Mother     ovarian  . Heart disease Father   . Cancer Sister     breast  . Breast cancer Sister   . Cancer Brother     lung  . Cancer Brother     prostate  . Prostate cancer Brother   . Breast cancer Sister   . Lung cancer Brother   . Lung cancer Brother       Prior to Admission medications   Medication Sig Start Date End Date Taking? Authorizing  Provider  aspirin 81 MG tablet Take 81 mg by mouth daily.   Yes Historical Provider, MD  atorvastatin (LIPITOR) 40 MG tablet Take 40 mg by mouth every morning.    Yes Historical Provider, MD  Cholecalciferol (VITAMIN D3) 1000 UNITS CAPS Take 1 capsule by mouth daily.   Yes Historical Provider, MD  cyanocobalamin 100 MCG tablet Take 100 mcg by mouth daily.   Yes Historical Provider, MD  fexofenadine (ALLEGRA) 180 MG tablet Take 180 mg by mouth as needed for allergies or rhinitis.   Yes Historical Provider, MD  Fluticasone Propionate (FLONASE NA) Place 1 spray into the nose as needed (allergies).    Yes Historical Provider, MD  guaiFENesin (MUCINEX) 600 MG 12 hr tablet Take 1,200 mg by mouth 2 (two) times daily as needed for cough or to loosen phlegm.    Yes Historical Provider, MD  insulin regular (NOVOLIN R,HUMULIN R) 100 units/mL injection Inject 46 Units into the skin 2 (two) times daily.    Yes Historical Provider, MD  metFORMIN (GLUCOPHAGE) 1000 MG tablet Take 1,000 mg by mouth 2 (two) times daily with a meal.  02/28/13  Yes Historical Provider, MD  Omega 3 1000 MG CAPS Take 1 capsule by mouth daily.   Yes Historical Provider, MD  omeprazole (PRILOSEC) 20 MG capsule Take 20 mg by mouth daily as needed (heartburn).    Yes Historical Provider, MD  ramipril (ALTACE) 10 MG capsule Take 10 mg by mouth every morning.    Yes Historical Provider, MD  ciprofloxacin (CIPRO) 500 MG tablet Take 1 tablet (500 mg total) by mouth 2 (two) times daily. Patient not taking: Reported on 08/15/2014 07/04/13   Kathie Rhodes, MD  HYDROcodone-acetaminophen [facility]) 10-325 MG per tablet Take 1-2 tablets by mouth every 4 (four) hours as needed for moderate pain. Maximum dose per 24 hours - 8 pills Patient not taking: Reported on 08/15/2014 07/04/13   Kathie Rhodes, MD  MOVIPREP 100 G SOLR Moviprep as directed / no substitutions Patient not taking: Reported on 12/28/2014 08/15/14   Lafayette Dragon, MD    Physical Exam: BP 107/56  mmHg  Pulse 110  Temp(Src) 98 F (36.7 C) (Oral)  Resp 18  SpO2 95%  GENERAL : Well developed, well nourished, alert and cooperative, and appears to be in no acute distress. HEAD: normocephalic. EYES: PERRL, EOMI. EARS:  hearing grossly intact. NOSE: No nasal discharge. THROAT: Oral cavity and pharynx normal. No inflammation, swelling, exudate, or lesions.  NECK: Neck supple, non-tender CARDIAC: Normal S1 and S2. No S3, S4 or murmurs. Rhythm is regular. There is mild peripheral edema. LUNGS: Clear to auscultation and percussion without rales, rhonchi, wheezing or diminished breath sounds. ABDOMEN: Positive  bowel sounds. Soft, nondistended, nontender. No guarding or rebound. No masses. NEUROLOGICAL: The mental examination revealed the patient was oriented to person, place, and time.CN II-XII intact. SKIN: Skin normal color, texture and turgor with no lesions or eruptions. GU: bloody urine in cath, urine clearing up, continuous irrigation.           Labs on Admission:  Reviewed.   Radiological Exams on Admission: No results found.    Assessment/Plan  Hematuria likely due to hemorrhagic cystitis:  Continue irrigation Urology following Titrate irrigation to light pink output Hold asp for now  Anemia:  Asymptomatic Due to hematuria Getting 2 units of PRBC Recheck Hb in am  H/o prostate cancer:  F/u as outpatinet  DM: continue home meds  HTN: cont home meds    Lines & Tubes: Foley Cath DVT prophylaxis: SCDs Consultants: Urology Code Status: Full     Gennaro Africa M.D [facility]

## 2014-12-28 NOTE — Consult Note (Signed)
7:15 PM   [name] Jul 19, 1940 [id number]  Referring provider: Dr. Gloriann Loan  Chief Complaint  Patient presents with  . Hematuria  . Weakness    HPI: The patient is a 73 year old male with a history of Gleason 4+3 prostate cancer and underwent IMRT in May 2015 who is since developed hemorrhagic cystitis. He presents today with clot retention of his 97 French Foley catheter and his hemoglobin was 6.9. The patient is battling symptomatic hemorrhagic cystitis last month. He recently started hyperbaric oxygen therapy but is only minute 3 apartment. He was having a dysuria and frequency and eventually retention so a Foley catheter was placed approximately 10 days ago. He's had intermittent hematuria since that time. However over the last 24 hours it has significantly worsened. He is a leaking around the catheter and has decreased drainage from the catheter. He has very dark colored blood coming from his catheter when it does drain. He also is passing clots.   PMH: Past Medical History  Diagnosis Date  . Prostate cancer ([place]) 03/2013    had seed implant and radiation for 5 weeks  . Hyperlipidemia   . Hypertension   . Type 2 diabetes mellitus ([place])   . GERD (gastroesophageal reflux disease)   . Wears glasses   . Arthritis     Surgical History: Past Surgical History  Procedure Laterality Date  . Prostate biopsy    . Inguinal hernia repair Left 2005  . Repair chronic incarcerated recurrent left inguinal hernia  10-23-2008  . Colonoscopy  2010  . Radioactive seed implant N/A 07/04/2013    Procedure: RADIOACTIVE SEED IMPLANT;  Surgeon: Claybon Jabs, MD;  Location: [facility];  Service: Urology;  Laterality: N/A;  . Cystoscopy N/A 07/04/2013    Procedure: CYSTOSCOPY FLEXIBLE;  Surgeon: Claybon Jabs, MD;  Location: [facility];  Service: Urology;  Laterality: N/A;    Home Medications:    Medication List    ASK your doctor about these  medications        aspirin 81 MG tablet  Take 81 mg by mouth daily.     atorvastatin 40 MG tablet  Commonly known as:  LIPITOR  Take 40 mg by mouth every morning.     ciprofloxacin 500 MG tablet  Commonly known as:  CIPRO  Take 1 tablet (500 mg total) by mouth 2 (two) times daily.     cyanocobalamin 100 MCG tablet  Take 100 mcg by mouth daily.     fexofenadine 180 MG tablet  Commonly known as:  ALLEGRA  Take 180 mg by mouth as needed for allergies or rhinitis.     FLONASE NA  Place 1 spray into the nose as needed (allergies).     guaiFENesin 600 MG 12 hr tablet  Commonly known as:  MUCINEX  Take 1,200 mg by mouth 2 (two) times daily as needed for cough or to loosen phlegm.     HYDROcodone-acetaminophen 10-325 MG tablet  Commonly known as:  NORCO  Take 1-2 tablets by mouth every 4 (four) hours as needed for moderate pain. Maximum dose per 24 hours - 8 pills     insulin regular 100 units/mL injection  Commonly known as:  NOVOLIN R,HUMULIN R  Inject 46 Units into the skin 2 (two) times daily.     metFORMIN 1000 MG tablet  Commonly known as:  GLUCOPHAGE  Take 1,000 mg by mouth 2 (two) times daily with a meal.  MOVIPREP 100 G Solr  Generic drug:  peg 3350 powder  Moviprep as directed / no substitutions     Omega 3 1000 MG Caps  Take 1 capsule by mouth daily.     omeprazole 20 MG capsule  Commonly known as:  PRILOSEC  Take 20 mg by mouth daily as needed (heartburn).     ramipril 10 MG capsule  Commonly known as:  ALTACE  Take 10 mg by mouth every morning.     Vitamin D3 1000 UNITS Caps  Take 1 capsule by mouth daily.        Allergies: No Known Allergies  Family History: Family History  Problem Relation Age of Onset  . Cancer Mother     ovarian  . Heart disease Father   . Cancer Sister     breast  . Breast cancer Sister   . Cancer Brother     lung  . Cancer Brother     prostate  . Prostate cancer Brother   . Breast cancer Sister   . Lung cancer  Brother   . Lung cancer Brother     Social History:  reports that he quit smoking about 6 years ago. His smoking use included Cigarettes. He has a 30 pack-year smoking history. He has never used smokeless tobacco. He reports that he does not drink alcohol or use illicit drugs.  ROS: 12 point ROS negative except as per HPI                                        Physical Exam: BP 107/56 mmHg  Pulse 110  Temp(Src) 98 F (36.7 C) (Oral)  Resp 18  SpO2 95%  Constitutional:  Alert and oriented, No acute distress. HEENT: [place] AT, moist mucus membranes.  Trachea midline, no masses. Cardiovascular: No clubbing, cyanosis, or edema. Respiratory: Normal respiratory effort, no increased work of breathing. GI: Abdomen is soft, nontender, nondistended, no abdominal masses GU: No CVA tenderness. Phallus uncircumcised. Testicles descended bilaterally. His urine is cool aid colored with clots. He is active drainage from his catheter. Skin: No rashes, bruises or suspicious lesions. Lymph: No cervical or inguinal adenopathy. Neurologic: Grossly intact, no focal deficits, moving all 4 extremities. Psychiatric: Normal mood and affect.  Laboratory Data: Lab Results  Component Value Date   WBC 11.5* 12/28/2014   HGB 6.9* 12/28/2014   HCT 21.3* 12/28/2014   MCV 78.6 12/28/2014   PLT 272 12/28/2014    Lab Results  Component Value Date   CREATININE 1.09 12/28/2014    No results found for: PSA  No results found for: TESTOSTERONE  No results found for: HGBA1C  Urinalysis No results found for: COLORURINE, APPEARANCEUR, LABSPEC, [place], GLUCOSEU, HGBUR, BILIRUBINUR, KETONESUR, PROTEINUR, UROBILINOGEN, NITRITE, LEUKOCYTESUR  Procedure: I removed the patient's 24 French catheter. I then sterilely prepped and draped him and placed a 24 French three-way Foley catheter. I irrigated of tremendous amount of clots from his bladder. After successful manual clot irrigation, I  started the patient on continuous bladder irrigation. On a moderate drip, his urine was completely clear.  Assessment & Plan:    1. Hemorrhagic cystitis -Continue CBI at this time. Titrate rate to light pink output. -Wean CBI as tolerated -Manually irrigate the catheter with 120 cc of normal saline when necessary for clot retention -Will continue to monitor his progress weaning CBI -The patient will continue his hyperbaric oxygen  therapy after discharge.  2. Anemia -Likely secondary to above -Agree with blood transfusion  3. Prostate cancer -Continue to monitor PSA as an outpatient   Nickie Retort, [place] [address], [place], [place] [address] [phone]

## 2014-12-28 NOTE — ED Notes (Signed)
Pt began urinating blood around his catheter this morning, pt complaining of abdominal pain, pt states he's starting to get weak and dizzy, appears pale, HR 114 in triage, BP stable. Has in-dwelling foley, hx of prostate cancer. Wife states blood in his urine has been normal for the last month.

## 2014-12-28 NOTE — ED Provider Notes (Signed)
CSN: [id number]     Arrival date & time 12/28/14  1626 History   First MD Initiated Contact with Patient 12/28/14 1648     Chief Complaint  Patient presents with  . Hematuria  . Weakness     (Consider location/radiation/quality/duration/timing/severity/associated sxs/prior Treatment) Patient is a 74 y.o. male presenting with hematuria.  Hematuria This is a recurrent problem. The current episode started more than 1 week ago. The problem occurs constantly. The problem has been rapidly worsening. Pertinent negatives include no chest pain, no abdominal pain, no headaches and no shortness of breath. Nothing aggravates the symptoms. Nothing relieves the symptoms. He has tried nothing for the symptoms.    Past Medical History  Diagnosis Date  . Prostate cancer ([place]) 03/2013    had seed implant and radiation for 5 weeks  . Hyperlipidemia   . Hypertension   . Type 2 diabetes mellitus ([place])   . GERD (gastroesophageal reflux disease)   . Wears glasses   . Arthritis    Past Surgical History  Procedure Laterality Date  . Prostate biopsy    . Inguinal hernia repair Left 2005  . Repair chronic incarcerated recurrent left inguinal hernia  10-23-2008  . Colonoscopy  2010  . Radioactive seed implant N/A 07/04/2013    Procedure: RADIOACTIVE SEED IMPLANT;  Surgeon: Claybon Jabs, MD;  Location: [facility];  Service: Urology;  Laterality: N/A;  . Cystoscopy N/A 07/04/2013    Procedure: CYSTOSCOPY FLEXIBLE;  Surgeon: Claybon Jabs, MD;  Location: [facility];  Service: Urology;  Laterality: N/A;   Family History  Problem Relation Age of Onset  . Cancer Mother     ovarian  . Heart disease Father   . Cancer Sister     breast  . Breast cancer Sister   . Cancer Brother     lung  . Cancer Brother     prostate  . Prostate cancer Brother   . Breast cancer Sister   . Lung cancer Brother   . Lung cancer Brother    Social History  Substance Use Topics  . Smoking  status: Former Smoker -- 1.00 packs/day for 30 years    Types: Cigarettes    Quit date: 02/01/2008  . Smokeless tobacco: Never Used  . Alcohol Use: No    Review of Systems  Respiratory: Negative for shortness of breath.   Cardiovascular: Negative for chest pain.  Gastrointestinal: Negative for abdominal pain.  Genitourinary: Positive for hematuria.  Neurological: Negative for headaches.  All other systems reviewed and are negative.     Allergies  Review of patient's allergies indicates no known allergies.  Home Medications   Prior to Admission medications   Medication Sig Start Date End Date Taking? Authorizing Provider  aspirin 81 MG tablet Take 81 mg by mouth daily.   Yes Historical Provider, MD  atorvastatin (LIPITOR) 40 MG tablet Take 40 mg by mouth every morning.    Yes Historical Provider, MD  Cholecalciferol (VITAMIN D3) 1000 UNITS CAPS Take 1 capsule by mouth daily.   Yes Historical Provider, MD  cyanocobalamin 100 MCG tablet Take 100 mcg by mouth daily.   Yes Historical Provider, MD  fexofenadine (ALLEGRA) 180 MG tablet Take 180 mg by mouth as needed for allergies or rhinitis.   Yes Historical Provider, MD  Fluticasone Propionate (FLONASE NA) Place 1 spray into the nose as needed (allergies).    Yes Historical Provider, MD  guaiFENesin (MUCINEX) 600 MG 12 hr tablet Take  1,200 mg by mouth 2 (two) times daily as needed for cough or to loosen phlegm.    Yes Historical Provider, MD  insulin regular (NOVOLIN R,HUMULIN R) 100 units/mL injection Inject 46 Units into the skin 2 (two) times daily.    Yes Historical Provider, MD  metFORMIN (GLUCOPHAGE) 1000 MG tablet Take 1,000 mg by mouth 2 (two) times daily with a meal.  02/28/13  Yes Historical Provider, MD  Omega 3 1000 MG CAPS Take 1 capsule by mouth daily.   Yes Historical Provider, MD  omeprazole (PRILOSEC) 20 MG capsule Take 20 mg by mouth daily as needed (heartburn).    Yes Historical Provider, MD  ramipril (ALTACE) 10 MG  capsule Take 10 mg by mouth every morning.    Yes Historical Provider, MD  ciprofloxacin (CIPRO) 500 MG tablet Take 1 tablet (500 mg total) by mouth 2 (two) times daily. Patient not taking: Reported on 08/15/2014 07/04/13   Kathie Rhodes, MD  HYDROcodone-acetaminophen [facility]) 10-325 MG per tablet Take 1-2 tablets by mouth every 4 (four) hours as needed for moderate pain. Maximum dose per 24 hours - 8 pills Patient not taking: Reported on 08/15/2014 07/04/13   Kathie Rhodes, MD  MOVIPREP 100 G SOLR Moviprep as directed / no substitutions Patient not taking: Reported on 12/28/2014 08/15/14   Lafayette Dragon, MD   BP 107/56 mmHg  Pulse 110  Temp(Src) 98 F (36.7 C) (Oral)  Resp 18  SpO2 95% Physical Exam  Constitutional: He is oriented to person, place, and time. He appears well-developed and well-nourished.  HENT:  Head: Normocephalic and atraumatic.  Eyes: Conjunctivae and EOM are normal.  Neck: Normal range of motion. Neck supple.  Cardiovascular: Normal rate, regular rhythm and normal heart sounds.   Pulmonary/Chest: Effort normal and breath sounds normal. No respiratory distress.  Abdominal: He exhibits no distension. There is no tenderness. There is no rebound and no guarding.  Genitourinary:  No gu abnormality  Musculoskeletal: Normal range of motion.  Neurological: He is alert and oriented to person, place, and time.  Skin: Skin is warm and dry.  Vitals reviewed.   ED Course  Procedures (including critical care time) Labs Review Labs Reviewed  BASIC METABOLIC PANEL - Abnormal; Notable for the following:    Sodium 134 (*)    Chloride 100 (*)    Glucose, Bld 207 (*)    All other components within normal limits  CBC - Abnormal; Notable for the following:    WBC 11.5 (*)    RBC 2.71 (*)    Hemoglobin 6.9 (*)    HCT 21.3 (*)    MCH 25.5 (*)    RDW 16.3 (*)    All other components within normal limits  URINALYSIS, ROUTINE W REFLEX MICROSCOPIC (NOT AT [facility])  PREPARE RBC  (CROSSMATCH)  TYPE AND SCREEN    Imaging Review No results found. I have personally reviewed and evaluated these images and lab results as part of my medical decision-making.   EKG Interpretation   Date/Time:  Sunday December 28 2014 16:53:59 EST Ventricular Rate:  111 PR Interval:  148 QRS Duration: 78 QT Interval:  336 QTC Calculation: 457 R Axis:   52 Text Interpretation:  Sinus tachycardia Ventricular premature complex Low  voltage, precordial leads SINCE LAST TRACING HEART RATE HAS INCREASED  Confirmed by Debby Freiberg [phone]) on 12/28/2014 4:56:59 PM     CRITICAL CARE Performed by: Debby Freiberg   Total critical care time: 35 minutes  Critical care time  was exclusive of separately billable procedures and treating other patients.  Critical care was necessary to treat or prevent imminent or life-threatening deterioration.  Critical care was time spent personally by me on the following activities: development of treatment plan with patient and/or surrogate as well as nursing, discussions with consultants, evaluation of patient's response to treatment, examination of patient, obtaining history from patient or surrogate, ordering and performing treatments and interventions, ordering and review of laboratory studies, ordering and review of radiographic studies, pulse oximetry and re-evaluation of patient's condition.  MDM   Final diagnoses:  None    74 y.o. male with pertinent PMH of prior prostate ca sp radiation with subsequent hematuria and cystoscopy, indwelling foley presents with worsening hematuria now leaking from around catheter, as well as fatigue.  Exam as above.  Consulted urology after anemia found.  CBI initiated.  Admitted to medicine after 2 units PRBC ordered.    I have reviewed all laboratory and imaging studies if ordered as above  No diagnosis found.      Debby Freiberg, MD 12/28/14 [phone]

## 2014-12-29 DIAGNOSIS — K219 Gastro-esophageal reflux disease without esophagitis: Secondary | ICD-10-CM | POA: Diagnosis not present

## 2014-12-29 DIAGNOSIS — Z8042 Family history of malignant neoplasm of prostate: Secondary | ICD-10-CM | POA: Diagnosis not present

## 2014-12-29 DIAGNOSIS — N3041 Irradiation cystitis with hematuria: Secondary | ICD-10-CM | POA: Diagnosis present

## 2014-12-29 DIAGNOSIS — E119 Type 2 diabetes mellitus without complications: Secondary | ICD-10-CM | POA: Diagnosis not present

## 2014-12-29 DIAGNOSIS — Z23 Encounter for immunization: Secondary | ICD-10-CM | POA: Diagnosis not present

## 2014-12-29 DIAGNOSIS — R05 Cough: Secondary | ICD-10-CM | POA: Diagnosis not present

## 2014-12-29 DIAGNOSIS — J189 Pneumonia, unspecified organism: Secondary | ICD-10-CM | POA: Diagnosis present

## 2014-12-29 DIAGNOSIS — Z79899 Other long term (current) drug therapy: Secondary | ICD-10-CM | POA: Diagnosis not present

## 2014-12-29 DIAGNOSIS — Z8249 Family history of ischemic heart disease and other diseases of the circulatory system: Secondary | ICD-10-CM | POA: Diagnosis not present

## 2014-12-29 DIAGNOSIS — N029 Recurrent and persistent hematuria with unspecified morphologic changes: Secondary | ICD-10-CM | POA: Diagnosis present

## 2014-12-29 DIAGNOSIS — M1990 Unspecified osteoarthritis, unspecified site: Secondary | ICD-10-CM | POA: Diagnosis not present

## 2014-12-29 DIAGNOSIS — D62 Acute posthemorrhagic anemia: Secondary | ICD-10-CM | POA: Diagnosis present

## 2014-12-29 DIAGNOSIS — Z7982 Long term (current) use of aspirin: Secondary | ICD-10-CM | POA: Diagnosis not present

## 2014-12-29 DIAGNOSIS — R319 Hematuria, unspecified: Secondary | ICD-10-CM | POA: Diagnosis present

## 2014-12-29 DIAGNOSIS — Z87891 Personal history of nicotine dependence: Secondary | ICD-10-CM | POA: Diagnosis not present

## 2014-12-29 DIAGNOSIS — C61 Malignant neoplasm of prostate: Secondary | ICD-10-CM | POA: Diagnosis not present

## 2014-12-29 DIAGNOSIS — Y842 Radiological procedure and radiotherapy as the cause of abnormal reaction of the patient, or of later complication, without mention of misadventure at the time of the procedure: Secondary | ICD-10-CM | POA: Diagnosis not present

## 2014-12-29 DIAGNOSIS — Z66 Do not resuscitate: Secondary | ICD-10-CM | POA: Diagnosis not present

## 2014-12-29 DIAGNOSIS — Z801 Family history of malignant neoplasm of trachea, bronchus and lung: Secondary | ICD-10-CM | POA: Diagnosis not present

## 2014-12-29 DIAGNOSIS — Z923 Personal history of irradiation: Secondary | ICD-10-CM | POA: Diagnosis not present

## 2014-12-29 DIAGNOSIS — I10 Essential (primary) hypertension: Secondary | ICD-10-CM | POA: Diagnosis not present

## 2014-12-29 DIAGNOSIS — N3289 Other specified disorders of bladder: Secondary | ICD-10-CM | POA: Diagnosis not present

## 2014-12-29 DIAGNOSIS — Z794 Long term (current) use of insulin: Secondary | ICD-10-CM | POA: Diagnosis not present

## 2014-12-29 DIAGNOSIS — Z803 Family history of malignant neoplasm of breast: Secondary | ICD-10-CM | POA: Diagnosis not present

## 2014-12-29 DIAGNOSIS — R531 Weakness: Secondary | ICD-10-CM | POA: Diagnosis not present

## 2014-12-29 DIAGNOSIS — E785 Hyperlipidemia, unspecified: Secondary | ICD-10-CM | POA: Diagnosis not present

## 2014-12-29 LAB — COMPREHENSIVE METABOLIC PANEL
ALBUMIN: 3.7 g/dL (ref 3.5–5.0)
ALT: 33 U/L (ref 17–63)
ANION GAP: 8 (ref 5–15)
AST: 31 U/L (ref 15–41)
Alkaline Phosphatase: 47 U/L (ref 38–126)
BILIRUBIN TOTAL: 0.6 mg/dL (ref 0.3–1.2)
BUN: 16 mg/dL (ref 6–20)
CO2: 27 mmol/L (ref 22–32)
Calcium: 9 mg/dL (ref 8.9–10.3)
Chloride: 104 mmol/L (ref 101–111)
Creatinine, Ser: 0.88 mg/dL (ref 0.61–1.24)
GFR calc Af Amer: >60 mL/min (ref >60)
GFR calc non Af Amer: >60 mL/min (ref >60)
GLUCOSE: 106 mg/dL — AB (ref 65–99)
POTASSIUM: 4.1 mmol/L (ref 3.5–5.1)
SODIUM: 139 mmol/L (ref 135–145)
Total Protein: 5.9 g/dL — ABNORMAL LOW (ref 6.5–8.1)

## 2014-12-29 LAB — CBC WITH DIFFERENTIAL/PLATELET
Basophils Absolute: 0 10*3/uL (ref 0.0–0.1)
Basophils Relative: 0 %
EOS PCT: 0 %
Eosinophils Absolute: 0 10*3/uL (ref 0.0–0.7)
HEMATOCRIT: 24 % — AB (ref 39.0–52.0)
Hemoglobin: 7.9 g/dL — ABNORMAL LOW (ref 13.0–17.0)
LYMPHS ABS: 1.8 10*3/uL (ref 0.7–4.0)
LYMPHS PCT: 17 %
MCH: 26.1 pg (ref 26.0–34.0)
MCHC: 32.9 g/dL (ref 30.0–36.0)
MCV: 79.2 fL (ref 78.0–100.0)
MONO ABS: 1.1 10*3/uL — AB (ref 0.1–1.0)
MONOS PCT: 10 %
NEUTROS ABS: 7.8 10*3/uL — AB (ref 1.7–7.7)
Neutrophils Relative %: 73 %
PLATELETS: 236 10*3/uL (ref 150–400)
RBC: 3.03 MIL/uL — ABNORMAL LOW (ref 4.22–5.81)
RDW: 15.9 % — AB (ref 11.5–15.5)
WBC: 10.6 10*3/uL — ABNORMAL HIGH (ref 4.0–10.5)

## 2014-12-29 LAB — GLUCOSE, CAPILLARY
GLUCOSE-CAPILLARY: 104 mg/dL — AB (ref 65–99)
GLUCOSE-CAPILLARY: 146 mg/dL — AB (ref 65–99)
GLUCOSE-CAPILLARY: 108 mg/dL — AB (ref 65–99)
Glucose-Capillary: 122 mg/dL — ABNORMAL HIGH (ref 65–99)

## 2014-12-29 MED ORDER — HYDROCOD POLST-CPM POLST ER 10-8 MG/5ML PO SUER
5.0000 mL | Freq: Two times a day (BID) | ORAL | Status: DC | PRN
  Administered 2014-12-30 – 2015-01-03 (×5): 5 mL via ORAL
  Filled 2014-12-29 (×5): qty 5

## 2014-12-29 MED ORDER — HYDROCOD POLST-CPM POLST ER 10-8 MG/5ML PO SUER
5.0000 mL | Freq: Once | ORAL | Status: AC
  Administered 2014-12-29: 5 mL via ORAL
  Filled 2014-12-29: qty 5

## 2014-12-29 MED ORDER — METHOCARBAMOL 500 MG PO TABS
500.0000 mg | ORAL_TABLET | Freq: Once | ORAL | Status: AC
  Administered 2014-12-29: 500 mg via ORAL
  Filled 2014-12-29: qty 1

## 2014-12-29 NOTE — Care Management Note (Signed)
Case Management Note  Patient Details  Name: [name] MRN: [id number] Date of Birth: [birth year]  Subjective/Objective:  74 y/o m admitted w/hematuria. From home.                  Action/Plan:d/c plan home.   Expected Discharge Date:                  Expected Discharge Plan:  Home/Self Care  In-House Referral:     Discharge planning Services  CM Consult  Post Acute Care Choice:    Choice offered to:     DME Arranged:    DME Agency:     HH Arranged:    HH Agency:     Status of Service:  In process, will continue to follow  Medicare Important Message Given:    Date Medicare IM Given:    Medicare IM give by:    Date Additional Medicare IM Given:    Additional Medicare Important Message give by:     If discussed at [place] of Stay Meetings, dates discussed:    Additional Comments:  Dessa Phi, RN 12/29/2014, 12:58 PM

## 2014-12-29 NOTE — Progress Notes (Addendum)
Pt c/o straining, appears to be bearing down, manually irrigated catheter with NS. Numerous clots removed. Pt felt relief. Will continue to monitor pt. Call bell within reach.  0330am- manually irrigated catheter with NS, pt feels relief. Will continue to monitor.   Janell Quiet, RN

## 2014-12-29 NOTE — Progress Notes (Signed)
Pt complaining of muscle cramps in thighs, paged on call, new orders given. Will continue to monitor.

## 2014-12-29 NOTE — Consult Note (Signed)
  Subjective: Patient reports no suprapubic discomfort or abdominal pain.  Objective: Vital signs in last 24 hours: Temp:  [97.9 F (36.6 C)-99.2 F (37.3 C)] 98.2 F (36.8 C) (11/28 0510) Pulse Rate:  [86-116] 86 (11/28 0510) Resp:  [16-20] 20 (11/28 0510) BP: (107-170)/(50-79) 132/55 mmHg (11/28 0510) SpO2:  [95 %-100 %] 100 % (11/28 0510) Weight:  [104 kg (229 lb 4.5 oz)] 104 kg (229 lb 4.5 oz) (11/27 2005)  Intake/Output from previous day: 11/27 0701 - 11/28 0700 In: 7105 [P.O.:340; Blood:765] Out: 16350 [[id number]] Intake/Output this shift:    Physical Exam:  He is alert, awake and in no distress His abdomen is soft and nontender. Catheter is indwelling. I applied mild traction.  Lab Results:  Recent Labs  12/28/14 1708 12/29/14 0446  HGB 6.9* 7.9*  HCT 21.3* 24.0*   BMET  Recent Labs  12/28/14 1708 12/29/14 0446  NA 134* 139  K 4.6 4.1  CL 100* 104  CO2 22 27  GLUCOSE 207* 106*  BUN 20 16  CREATININE 1.09 0.88  CALCIUM 9.1 9.0    Recent Labs  12/28/14 2024  INR 1.08   No results for input(s): LABURIN in the last 72 hours. No results found for this or any previous visit.  Studies/Results: No results found.  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 He has been undergoing hyperbaric oxygen treatments as an outpatient which tends to be quite successful in treating this type of bleeding. While cystoscopy and fulguration may become necessary it is often times unrewarding as the bleeding is typically diffuse and difficult to control with cauterization. We have discussed this and have elected to proceed conservatively with continued CBI. His urine has cleared somewhat on a slow CBI drip and I applied traction to his catheter with the hope that this will tamponade bleeding from the bladder neck/prostate. I saw bleeding from the area of the bladder neck at the time of cystoscopy last month but noted no lesions  or other abnormality within the bladder otherwise. Further treatments if he does not clear with include intravesical alum irrigation or possibly carboprost.  Continue CBI  Catheter placed on traction    LOS: 1 day   Hennie Gosa C 12/29/2014, 9:40 AM

## 2014-12-29 NOTE — Progress Notes (Signed)
Pt c/o of straining and feeling like he has to pee, manually irrigated catheter with normal saline. Multiple clots removed. Pt felt relief. Will continue to monitor.  Janell Quiet, RN

## 2014-12-29 NOTE — Progress Notes (Signed)
[facility] PROGRESS NOTE  [name] KDT:[id number] DOB: 1940-04-16 DOA: 12/28/2014 PCP: Kandice Hams, MD  Assessment/Plan: 1. 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 Further management as per urology. He was started on CBI. Monitor clearing of hematuria. Holding aspirin.    2. Anemia: from anemia of blood loss. Received 2 units of prbc transfusion.  Plan to check hemoglobin in am.   3. Prostate cancer: Further management as per oncology.   4. Diabetes Mellitus: CBG (last 3)   Recent Labs  12/29/14 0825 12/29/14 1157 12/29/14 1704  GLUCAP 104* 146* 122*    hgba1c is pending. Resume SSI.    Hypertension:  Better controlled.    Code Status: DNR Family Communication: NONE at bedside.  Disposition Plan:pending PT EVAL.    Consultants:  UROLOGY.   Procedures:  none  Antibiotics:  none  HPI/Subjective: No new complaints.   Objective: Filed Vitals:   12/29/14 0510 12/29/14 1320  BP: 132/55 127/55  Pulse: 86 85  Temp: 98.2 F (36.8 C) 98.8 F (37.1 C)  Resp: 20 20    Intake/Output Summary (Last 24 hours) at 12/29/14 1909 Last data filed at 12/29/14 1905  Gross per 24 hour  Intake  16105 ml  Output  26350 ml  Net -10245 ml   Filed Weights   12/28/14 2005  Weight: 104 kg (229 lb 4.5 oz)    Exam:   General:  Alert afebrile comfortable.   Cardiovascular: s1s2  Respiratory: ctab  Abdomen: soft NT nd bs+  Musculoskeletal: no pedal edema.   Data Reviewed: Basic Metabolic Panel:  Recent Labs Lab 12/28/14 1708 12/29/14 0446  NA 134* 139  K 4.6 4.1  CL 100* 104  CO2 22 27  GLUCOSE 207* 106*  BUN 20 16  CREATININE 1.09 0.88  CALCIUM 9.1 9.0   Liver Function Tests:  Recent Labs Lab 12/29/14 0446  AST 31  ALT 33  ALKPHOS 47  BILITOT 0.6  PROT 5.9*  ALBUMIN 3.7   No results for input(s): LIPASE, AMYLASE in the last 168 hours. No results for input(s): AMMONIA in the last 168  hours. CBC:  Recent Labs Lab 12/28/14 1708 12/29/14 0446  WBC 11.5* 10.6*  NEUTROABS  --  7.8*  HGB 6.9* 7.9*  HCT 21.3* 24.0*  MCV 78.6 79.2  PLT 272 236   Cardiac Enzymes: No results for input(s): CKTOTAL, CKMB, CKMBINDEX, TROPONINI in the last 168 hours. BNP (last 3 results) No results for input(s): BNP in the last 8760 hours.  ProBNP (last 3 results) No results for input(s): PROBNP in the last 8760 hours.  CBG:  Recent Labs Lab 12/28/14 2117 12/29/14 0825 12/29/14 1157 12/29/14 1704  GLUCAP 182* 104* 146* 122*    No results found for this or any previous visit (from the past 240 hour(s)).   Studies: No results found.  Scheduled Meds: . sodium chloride   Intravenous Once  . atorvastatin  40 mg Oral q morning - 10a  . insulin aspart  0-9 Units Subcutaneous TID WC  . insulin aspart protamine- aspart  40 Units Subcutaneous BID WC  . pantoprazole  40 mg Oral Daily  . ramipril  10 mg Oral q morning - 10a  . sodium chloride  3 mL Intravenous Q12H   Continuous Infusions:   Active Problems:   Acute blood loss anemia   Hematuria   Pneumonia    Time spent: 25 minutes.     [place] [place] [facility] Pager [phone] If 7PM-7AM, please contact  night-coverage at [email or web address], password [facility] 12/29/2014, 7:09 PM  LOS: 1 day

## 2014-12-30 LAB — CBC WITH DIFFERENTIAL/PLATELET
Basophils Absolute: 0 10*3/uL (ref 0.0–0.1)
Basophils Relative: 0 %
Eosinophils Absolute: 0.1 10*3/uL (ref 0.0–0.7)
Eosinophils Relative: 1 %
HEMATOCRIT: 22.2 % — AB (ref 39.0–52.0)
Hemoglobin: 7.2 g/dL — ABNORMAL LOW (ref 13.0–17.0)
LYMPHS ABS: 1.6 10*3/uL (ref 0.7–4.0)
LYMPHS PCT: 17 %
MCH: 26.5 pg (ref 26.0–34.0)
MCHC: 32.4 g/dL (ref 30.0–36.0)
MCV: 81.6 fL (ref 78.0–100.0)
MONO ABS: 0.8 10*3/uL (ref 0.1–1.0)
MONOS PCT: 9 %
NEUTROS ABS: 6.9 10*3/uL (ref 1.7–7.7)
Neutrophils Relative %: 73 %
Platelets: 239 10*3/uL (ref 150–400)
RBC: 2.72 MIL/uL — ABNORMAL LOW (ref 4.22–5.81)
RDW: 16.3 % — AB (ref 11.5–15.5)
WBC: 9.4 10*3/uL (ref 4.0–10.5)

## 2014-12-30 LAB — GLUCOSE, CAPILLARY
GLUCOSE-CAPILLARY: 160 mg/dL — AB (ref 65–99)
Glucose-Capillary: 127 mg/dL — ABNORMAL HIGH (ref 65–99)
Glucose-Capillary: 60 mg/dL — ABNORMAL LOW (ref 65–99)
Glucose-Capillary: 116 mg/dL — ABNORMAL HIGH (ref 65–99)
Glucose-Capillary: 142 mg/dL — ABNORMAL HIGH (ref 65–99)

## 2014-12-30 LAB — BASIC METABOLIC PANEL
Anion gap: 7 (ref 5–15)
BUN: 15 mg/dL (ref 6–20)
CALCIUM: 8.6 mg/dL — AB (ref 8.9–10.3)
CO2: 28 mmol/L (ref 22–32)
CREATININE: 0.84 mg/dL (ref 0.61–1.24)
Chloride: 104 mmol/L (ref 101–111)
GFR calc Af Amer: >60 mL/min (ref >60)
GFR calc non Af Amer: >60 mL/min (ref >60)
GLUCOSE: 57 mg/dL — AB (ref 65–99)
Potassium: 4.1 mmol/L (ref 3.5–5.1)
Sodium: 139 mmol/L (ref 135–145)

## 2014-12-30 LAB — PREPARE RBC (CROSSMATCH)

## 2014-12-30 MED ORDER — SODIUM CHLORIDE 0.9 % IV SOLN
Freq: Once | INTRAVENOUS | Status: AC
  Administered 2014-12-30: 19:00:00 via INTRAVENOUS

## 2014-12-30 MED ORDER — INSULIN ASPART PROT & ASPART (70-30 MIX) 100 UNIT/ML ~~LOC~~ SUSP
20.0000 [IU] | Freq: Two times a day (BID) | SUBCUTANEOUS | Status: DC
  Administered 2014-12-31 – 2015-01-04 (×8): 20 [IU] via SUBCUTANEOUS
  Filled 2014-12-30: qty 10

## 2014-12-30 NOTE — Progress Notes (Signed)
  Subjective: Patient reports no discomfort.  He did report having an episode of what he felt like was a bladder spasm last night and irrigation revealed a small clot present.  No further bladder discomfort has been reported since that time.  Objective: Vital signs in last 24 hours: Temp:  [98.2 F (36.8 C)-98.8 F (37.1 C)] 98.6 F (37 C) (11/29 0452) Pulse Rate:  [82-85] 82 (11/28 2110) Resp:  [20] 20 (11/29 0452) BP: (104-127)/(41-55) 104/54 mmHg (11/29 0452) SpO2:  [96 %-100 %] 96 % (11/29 0452)  Intake/Output from previous day: 11/28 0701 - 11/29 0700 In: 15500 [P.O.:800] Out: 16400 [Urine:16400] Intake/Output this shift:    Physical Exam:  General:alert and no distress GI: his abdomen is soft and nontender with no mass.  Lab Results:  Recent Labs  12/28/14 1708 12/29/14 0446 12/30/14 0503  HGB 6.9* 7.9* 7.2*  HCT 21.3* 24.0* 22.2*   BMET  Recent Labs  12/29/14 0446 12/30/14 0503  NA 139 139  K 4.1 4.1  CL 104 104  CO2 27 28  GLUCOSE 106* 57*  BUN 16 15  CREATININE 0.88 0.84  CALCIUM 9.0 8.6*    Recent Labs  12/28/14 2024  INR 1.08   No results for input(s): LABURIN in the last 72 hours. No results found for this or any previous visit.  Studies/Results: No results found.  Assessment/Plan: His CBI is running at a very low rate so I will stop it and leave his catheter in to see how he does over the next 12-24 hours.   Stop CBI  Hand irrigate p.r.n.  Leave catheter for now   LOS: 2 days   Solenne Manwarren C 12/30/2014, 7:04 AM

## 2014-12-30 NOTE — Progress Notes (Signed)
[facility] PROGRESS NOTE  [name]:[id number] DOB: December 26, 1940 DOA: 12/28/2014 PCP: Kandice Hams, MD Interim summary: Patient is a 74 yo male with history of prostate cancer Gleason 4+3 s/p radiation in May 2015, has been doing well until 5 weeks ago when he started having hematuria. 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 His hematuria hasn't improved since then. He was admitted on 11/27 and CBI started. Urology consulted and recommended stopping the CBI today and watch for improving hematuria.   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 Further management as per urology. CBI stopped this am. Monitor.   2. Anemia: from anemia of blood loss. Received 2 units of prbc transfusion on admission, repeat hemoglobin is 7.2, ordered another 2 unitso f prbc transfusion.   3. Prostate cancer: Further management as per oncology.   4. Diabetes Mellitus: CBG (last 3)   Recent Labs  12/30/14 0735 12/30/14 0811 12/30/14 1141  GLUCAP 60* 127* 116*    hgba1c is pending. Resume SSI.  Decrease the dose of insulin to half the dose.    Hypertension:  Better controlled.    Code Status: DNR Family Communication: wife at bedside.  Disposition Plan:pending PT EVAL. Home when hematuria improves.    Consultants:  UROLOGY. `  Procedures:  CBI  4 units of prbc transfusion ordered.   Antibiotics:  none  HPI/Subjective: No nausea, vomiting or abdominal pain.   Objective: Filed Vitals:   12/30/14 0452 12/30/14 1400  BP: 104/54 120/49  Pulse:  81  Temp: 98.6 F (37 C) 98.1 F (36.7 C)  Resp: 20 20    Intake/Output Summary (Last 24 hours) at 12/30/14 1750 Last data filed at 12/30/14 1228  Gross per 24 hour  Intake  11720 ml  Output  11525 ml  Net    195 ml   Filed Weights   12/28/14 2005  Weight: 104 kg (229 lb 4.5 oz)    Exam:   General:  Alert afebrile comfortable.    Cardiovascular: s1s2  Respiratory: ctab  Abdomen: soft NT nd bs+  Musculoskeletal: no pedal edema.   Data Reviewed: Basic Metabolic Panel:  Recent Labs Lab 12/28/14 1708 12/29/14 0446 12/30/14 0503  NA 134* 139 139  K 4.6 4.1 4.1  CL 100* 104 104  CO2 '22 27 28  '$ GLUCOSE 207* 106* 57*  BUN '20 16 15  '$ CREATININE 1.09 0.88 0.84  CALCIUM 9.1 9.0 8.6*   Liver Function Tests:  Recent Labs Lab 12/29/14 0446  AST 31  ALT 33  ALKPHOS 47  BILITOT 0.6  PROT 5.9*  ALBUMIN 3.7   No results for input(s): LIPASE, AMYLASE in the last 168 hours. No results for input(s): AMMONIA in the last 168 hours. CBC:  Recent Labs Lab 12/28/14 1708 12/29/14 0446 12/30/14 0503  WBC 11.5* 10.6* 9.4  NEUTROABS  --  7.8* 6.9  HGB 6.9* 7.9* 7.2*  HCT 21.3* 24.0* 22.2*  MCV 78.6 79.2 81.6  PLT 272 236 239   Cardiac Enzymes: No results for input(s): CKTOTAL, CKMB, CKMBINDEX, TROPONINI in the last 168 hours. BNP (last 3 results) No results for input(s): BNP in the last 8760 hours.  ProBNP (last 3 results) No results for input(s): PROBNP in the last 8760 hours.  CBG:  Recent Labs Lab 12/29/14 1704 12/29/14 2116 12/30/14 0735 12/30/14 0811 12/30/14 1141  GLUCAP 122* 108* 60* 127* 116*    No results found for this or any previous visit (from the past  240 hour(s)).   Studies: No results found.  Scheduled Meds: . sodium chloride   Intravenous Once  . sodium chloride   Intravenous Once  . atorvastatin  40 mg Oral q morning - 10a  . insulin aspart  0-9 Units Subcutaneous TID WC  . [START ON 12/31/2014] insulin aspart protamine- aspart  20 Units Subcutaneous BID WC  . pantoprazole  40 mg Oral Daily  . ramipril  10 mg Oral q morning - 10a  . sodium chloride  3 mL Intravenous Q12H   Continuous Infusions:   Active Problems:   Acute blood loss anemia   Hematuria   Pneumonia    Time spent: 25 minutes.     [place] [facility] Pager [phone] If  7PM-7AM, please contact night-coverage at [email or web address], password [facility] 12/30/2014, 5:50 PM  LOS: 2 days

## 2014-12-30 NOTE — Progress Notes (Signed)
Inpatient Diabetes Program Recommendations  AACE/ADA: New Consensus Statement on Inpatient Glycemic Control (2015)  Target Ranges:  Prepandial:   less than 140 mg/dL      Peak postprandial:   less than 180 mg/dL (1-2 hours)      Critically ill patients:  140 - 180 mg/dL   Review of Glycemic Control Results for [name], [name] (MRN [id number]) as of 12/30/2014 11:16  Ref. Range 12/29/2014 11:57 12/29/2014 17:04 12/29/2014 21:16 12/30/2014 07:35 12/30/2014 08:11  Glucose-Capillary Latest Ref Range: 65-99 mg/dL 146 (H) 122 (H) 108 (H) 60 (L) 127 (H)   Hypoglycemia this am.   Inpatient Diabetes Program Recommendations:    Decrease 70/30 to 35 units bid.  Will continue to follow. Thank you. Lorenda Peck, RD, LDN, CDE Inpatient Diabetes Coordinator [phone]

## 2014-12-30 NOTE — Evaluation (Signed)
Physical Therapy Evaluation Patient Details Name: [name] MRN: [id number] DOB: 01/28/1941 Today's Date: 12/30/2014   History of Present Illness  74 yo male admitted with anemia, hematuria. Hx of prostate cancer.   Clinical Impression  On eval, pt required Min guard assist for mobility-walked ~175 feet with RW. Pt c/o L knee pain-7/10 with activity. Otherwise, pt tolerated activity fairly well.     Follow Up Recommendations Supervision - Intermittent    Equipment Recommendations  None recommended by PT (pt already has a walker)    Recommendations for Other Services       Precautions / Restrictions Precautions Precautions: Fall Restrictions Weight Bearing Restrictions: No      Mobility  Bed Mobility Overal bed mobility: Needs Assistance Bed Mobility: Supine to Sit     Supine to sit: Supervision     General bed mobility comments: for safety. increased time.   Transfers Overall transfer level: Needs assistance Equipment used: Rolling walker (2 wheeled) Transfers: Sit to/from [facility] Sit to Stand: Min guard Stand pivot transfers: Min guard       General transfer comment: close guard for safety.   Ambulation/Gait Ambulation/Gait assistance: Min guard Ambulation Distance (Feet): 175 Feet Assistive device: Rolling walker (2 wheeled) Gait Pattern/deviations: Step-through pattern;Decreased stride length;Antalgic     General Gait Details: Pt c/o L knee pain. Took a few steps without walker-gait was antalgic and pt was looking for something to hold on to. Min guard assist with use of walker.   Stairs            Wheelchair Mobility    Modified Rankin (Stroke Patients Only)       Balance Overall balance assessment: Needs assistance         Standing balance support: During functional activity Standing balance-Leahy Scale: Fair                               Pertinent Vitals/Pain Pain Assessment: Faces Faces  Pain Scale: Hurts even more Pain Location: L knee with activity Pain Descriptors / Indicators: Sore Pain Intervention(s): Monitored during session;Heat applied    Home Living Family/patient expects to be discharged to:: Private residence Living Arrangements: Spouse/significant other   Type of Home: House Home Access: Stairs to enter Entrance Stairs-Rails: None Entrance Stairs-Number of Steps: 2 Home Layout: One level Home Equipment: Crutches;Walker - 2 wheels      Prior Function Level of Independence: Independent               Hand Dominance        Extremity/Trunk Assessment   Upper Extremity Assessment: Overall WFL for tasks assessed           Lower Extremity Assessment: Generalized weakness      Cervical / Trunk Assessment: Normal  Communication   Communication: No difficulties  Cognition Arousal/Alertness: Awake/alert Behavior During Therapy: WFL for tasks assessed/performed Overall Cognitive Status: Within Functional Limits for tasks assessed                      General Comments      Exercises        Assessment/Plan    PT Assessment Patient needs continued PT services  PT Diagnosis Difficulty walking;Acute pain   PT Problem List Decreased mobility;Decreased balance;Decreased activity tolerance;Pain  PT Treatment Interventions DME instruction;Gait training;Functional mobility training;Therapeutic activities;Patient/family education;Balance training;Therapeutic exercise   PT Goals (Current goals can be found in  the Care Plan section) Acute Rehab PT Goals Patient Stated Goal: none stated PT Goal Formulation: With patient Time For Goal Achievement: 01/13/15 Potential to Achieve Goals: Good    Frequency Min 3X/week   Barriers to discharge        Co-evaluation               End of Session Equipment Utilized During Treatment: Gait belt Activity Tolerance: Patient tolerated treatment well Patient left: in chair;with call  bell/phone within reach;with chair alarm set;with family/visitor present           Time: 1455-1537 PT Time Calculation (min) (ACUTE ONLY): 42 min   Charges:   PT Evaluation $Initial PT Evaluation Tier I: 1 Procedure     PT G Codes:        Weston Anna, MPT Pager: [phone]

## 2014-12-30 NOTE — Progress Notes (Signed)
CRITICAL VALUE ALERT  Critical value received:  glucose  Date of notification:  11/29  Time of notification:  381  Critical value read back:Yes.    Nurse who received alert:  Wess Botts  MD notified (1st page):  Karleen Hampshire  Time of first page:    MD notified (2nd page):  Time of second page:  Responding MD:  Karleen Hampshire  Time MD responded:    Orange juice given and CBG recheck.  Sugar back up to 127.  MD made aware.

## 2014-12-30 NOTE — Progress Notes (Addendum)
Patient c/o pressure around foley catheter.  Manually irrigated with NS.  A few small clots removed.  Patient stated he felt relief.  Will continue to monitor closely.    At [place] patient called out again c/o pressure and pain around foley.  Manually irrigated with NS.  Multiple large clots removed.  Patient stated he felt relief.

## 2014-12-31 ENCOUNTER — Inpatient Hospital Stay (HOSPITAL_COMMUNITY): Payer: Medicare Other

## 2014-12-31 DIAGNOSIS — R05 Cough: Secondary | ICD-10-CM

## 2014-12-31 DIAGNOSIS — R319 Hematuria, unspecified: Secondary | ICD-10-CM

## 2014-12-31 DIAGNOSIS — D62 Acute posthemorrhagic anemia: Secondary | ICD-10-CM

## 2014-12-31 LAB — CBC
HEMATOCRIT: 25 % — AB (ref 39.0–52.0)
HEMOGLOBIN: 8.3 g/dL — AB (ref 13.0–17.0)
MCH: 27.1 pg (ref 26.0–34.0)
MCHC: 33.2 g/dL (ref 30.0–36.0)
MCV: 81.7 fL (ref 78.0–100.0)
Platelets: 225 10*3/uL (ref 150–400)
RBC: 3.06 MIL/uL — AB (ref 4.22–5.81)
RDW: 16.2 % — ABNORMAL HIGH (ref 11.5–15.5)
WBC: 9.2 10*3/uL (ref 4.0–10.5)

## 2014-12-31 LAB — GLUCOSE, CAPILLARY
GLUCOSE-CAPILLARY: 146 mg/dL — AB (ref 65–99)
GLUCOSE-CAPILLARY: 167 mg/dL — AB (ref 65–99)
Glucose-Capillary: 163 mg/dL — ABNORMAL HIGH (ref 65–99)
Glucose-Capillary: 191 mg/dL — ABNORMAL HIGH (ref 65–99)

## 2014-12-31 LAB — BASIC METABOLIC PANEL
ANION GAP: 8 (ref 5–15)
BUN: 14 mg/dL (ref 6–20)
CHLORIDE: 107 mmol/L (ref 101–111)
CO2: 27 mmol/L (ref 22–32)
CREATININE: 0.82 mg/dL (ref 0.61–1.24)
Calcium: 8.6 mg/dL — ABNORMAL LOW (ref 8.9–10.3)
GFR calc non Af Amer: >60 mL/min (ref >60)
Glucose, Bld: 132 mg/dL — ABNORMAL HIGH (ref 65–99)
POTASSIUM: 4.3 mmol/L (ref 3.5–5.1)
SODIUM: 142 mmol/L (ref 135–145)

## 2014-12-31 MED ORDER — SODIUM CHLORIDE 0.9 % IR SOLN
Status: DC
  Administered 2014-12-31 – 2015-01-01 (×2): via INTRAVESICAL
  Filled 2014-12-31 (×3): qty 40

## 2014-12-31 MED ORDER — SODIUM CHLORIDE 0.9 % IR SOLN
3000.0000 mL | Status: DC
  Administered 2014-12-31 – 2015-01-01 (×2): 3000 mL via INTRAVESICAL

## 2014-12-31 MED ORDER — ACETAMINOPHEN 325 MG PO TABS
650.0000 mg | ORAL_TABLET | Freq: Four times a day (QID) | ORAL | Status: DC | PRN
  Administered 2014-12-31: 650 mg via ORAL
  Filled 2014-12-31: qty 2

## 2014-12-31 MED ORDER — SODIUM CHLORIDE 0.9 % IR SOLN
Status: DC
  Filled 2014-12-31: qty 30

## 2014-12-31 NOTE — Progress Notes (Signed)
  Subjective: Patient reported very little this morning about the previous 24 hours.  I was able to ascertain that his catheter required irrigation on more than one occasion.  Objective: Vital signs in last 24 hours: Temp:  [98.1 F (36.7 C)-99.9 F (37.7 C)] 99.2 F (37.3 C) (11/30 0626) Pulse Rate:  [81-87] 86 (11/30 0626) Resp:  [16-20] 16 (11/30 0626) BP: (108-139)/(42-67) 138/67 mmHg (11/30 0626) SpO2:  [94 %-100 %] 94 % (11/30 0626)  Intake/Output from previous day: 11/29 0701 - 11/30 0700 In: 3377.5 [P.O.:960; Blood:617.5] Out: 6125 [Urine:6125] Intake/Output this shift: Total I/O In: 827.5 [P.O.:240; Blood:587.5] Out: 2300 [Urine:2300]  Physical Exam:  General:alert and no distress  His catheter appeared to be draining but it was bloody.   Lab Results:  Recent Labs  12/29/14 0446 12/30/14 0503 12/31/14 0423  HGB 7.9* 7.2* 8.3*  HCT 24.0* 22.2* 25.0*   BMET  Recent Labs  12/30/14 0503 12/31/14 0423  NA 139 142  K 4.1 4.3  CL 104 107  CO2 28 27  GLUCOSE 57* 132*  BUN 15 14  CREATININE 0.84 0.82  CALCIUM 8.6* 8.6*    Recent Labs  12/28/14 2024  INR 1.08   No results for input(s): LABURIN in the last 72 hours. No results found for this or any previous visit.  Studies/Results: No results found.  Assessment/Plan: Radiation induced hemorhagic cystitis: It appears he did not remained clear off of CBI over the past 24 hours.  He has undergone saline CBI without clearance.  The next step in the treatment algorithm is the use of alum irrigation.  I am not going to use silver nitrate irrigation or phenol instillation however if the allum is ineffective I would then proceed with carboprost.   1.  Begin alum bladder irrigation. 2.  Monitoring aluminum are not necessary as he has normal renal function.   LOS: 3 days   Makaley Storts C 12/31/2014, 6:49 AM

## 2014-12-31 NOTE — Progress Notes (Signed)
Pt c/o pressure around foley, manually irrigated, few small clots removed. Pt stated he felt relief. Will continue to monitor.  Janell Quiet, RN

## 2014-12-31 NOTE — Care Management Important Message (Signed)
Important Message  Patient Details  Name: [name] MRN: [id number] Date of Birth: 01/31/1941   Medicare Important Message Given:  Yes    Camillo Flaming 12/31/2014, 1:01 PMImportant Message  Patient Details  Name: [name] MRN: [id number] Date of Birth: [birth year]   Medicare Important Message Given:  Yes    Camillo Flaming 12/31/2014, 1:01 PM

## 2014-12-31 NOTE — Progress Notes (Signed)
[facility] PROGRESS NOTE  [name] WIO:[id number] DOB: 10-31-1940 DOA: 12/28/2014 PCP: Kandice Hams, MD Interim summary: Patient is a 74 yo male with history of prostate cancer Gleason 4+3 s/p radiation in May 2015, has been doing well until 5 weeks ago when he started having hematuria. 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 His hematuria hasn't improved since then. He was admitted on 11/27 and CBI started. Urology consulted and recommended stopping the CBI today and watch for improving hematuria.   Assessment/Plan: 1. 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 Persistent hematuria, Further management as per urology.   2. Anemia: from anemia of blood loss.  S/p 4units prbc on 11/27, then 1unit of 11/29, transfuse prn to keep hgb >8.  3. Cough: cxr pending, incentive spirometer for now   4. Prostate cancer: Further management as per oncology.   5. Diabetes Mellitus: CBG (last 3)   Recent Labs  12/30/14 1801 12/30/14 2219 12/31/14 0734  GLUCAP 142* 160* 146*    hgba1c is pending. Resume SSI.  Decrease the dose of insulin to half his home dose, continue adjust insulin.   6. Hypertension:  Better controlled.    Code Status: DNR Family Communication: patient Disposition Plan:pending PT EVAL. Home when hematuria improves.    Consultants:  UROLOGY. `  Procedures:  CBI  prbc x4units on 11/27, then 1unit on 11/29   Antibiotics:  none  HPI/Subjective: Persistent hematuria, hgb stable after prbc transfusion on 11/29 C/o productive cough,denies chest pain, no sob, on room air.  Objective: Filed Vitals:   12/31/14 0626 12/31/14 0923  BP: 138/67 134/55  Pulse: 86   Temp: 99.2 F (37.3 C)   Resp: 16     Intake/Output Summary (Last 24 hours) at 12/31/14 1208 Last data filed at 12/31/14 0636  Gross per 24 hour  Intake 1217.5 ml  Output   3900 ml  Net -2682.5 ml   Filed Weights   12/28/14  2005  Weight: 229 lb 4.5 oz (104 kg)    Exam:   General:  Alert afebrile comfortable.   Cardiovascular: s1s2  Respiratory: mild crackles left lung base, otherwise, no rhonchi, no wheezing  Abdomen: soft NT nd bs+  Musculoskeletal: no pedal edema.   Data Reviewed: Basic Metabolic Panel:  Recent Labs Lab 12/28/14 1708 12/29/14 0446 12/30/14 0503 12/31/14 0423  NA 134* 139 139 142  K 4.6 4.1 4.1 4.3  CL 100* 104 104 107  CO2 '22 27 28 27  '$ GLUCOSE 207* 106* 57* 132*  BUN '20 16 15 14  '$ CREATININE 1.09 0.88 0.84 0.82  CALCIUM 9.1 9.0 8.6* 8.6*   Liver Function Tests:  Recent Labs Lab 12/29/14 0446  AST 31  ALT 33  ALKPHOS 47  BILITOT 0.6  PROT 5.9*  ALBUMIN 3.7   No results for input(s): LIPASE, AMYLASE in the last 168 hours. No results for input(s): AMMONIA in the last 168 hours. CBC:  Recent Labs Lab 12/28/14 1708 12/29/14 0446 12/30/14 0503 12/31/14 0423  WBC 11.5* 10.6* 9.4 9.2  NEUTROABS  --  7.8* 6.9  --   HGB 6.9* 7.9* 7.2* 8.3*  HCT 21.3* 24.0* 22.2* 25.0*  MCV 78.6 79.2 81.6 81.7  PLT 272 236 239 225   Cardiac Enzymes: No results for input(s): CKTOTAL, CKMB, CKMBINDEX, TROPONINI in the last 168 hours. BNP (last 3 results) No results for input(s): BNP in the last 8760 hours.  ProBNP (last 3 results) No results for input(s): PROBNP in  the last 8760 hours.  CBG:  Recent Labs Lab 12/30/14 0811 12/30/14 1141 12/30/14 1801 12/30/14 2219 12/31/14 0734  GLUCAP 127* 116* 142* 160* 146*    No results found for this or any previous visit (from the past 240 hour(s)).   Studies: No results found.  Scheduled Meds: . sodium chloride   Intravenous Once  . atorvastatin  40 mg Oral q morning - 10a  . carboprost (HEMABATE) bladder irrigation - continuous   Bladder Irrigation Q24H   And  . sodium chloride irrigation  3,000 mL Bladder Irrigation Q24H  . insulin aspart  0-9 Units Subcutaneous TID WC  . insulin aspart protamine- aspart  20  Units Subcutaneous BID WC  . pantoprazole  40 mg Oral Daily  . ramipril  10 mg Oral q morning - 10a  . sodium chloride  3 mL Intravenous Q12H   Continuous Infusions:   Active Problems:   Acute blood loss anemia   Hematuria   Pneumonia    Time spent: 25 minutes.     Shali Vesey MD PhD  [facility] Pager [phone] If 7PM-7AM, please contact night-coverage at [email or web address], password [facility] 12/31/2014, 12:08 PM  LOS: 3 days

## 2015-01-01 ENCOUNTER — Encounter (HOSPITAL_BASED_OUTPATIENT_CLINIC_OR_DEPARTMENT_OTHER): Payer: BLUE CROSS/BLUE SHIELD

## 2015-01-01 LAB — CBC
HEMATOCRIT: 25 % — AB (ref 39.0–52.0)
HEMOGLOBIN: 8.2 g/dL — AB (ref 13.0–17.0)
MCH: 27.2 pg (ref 26.0–34.0)
MCHC: 32.8 g/dL (ref 30.0–36.0)
MCV: 83.1 fL (ref 78.0–100.0)
Platelets: 261 10*3/uL (ref 150–400)
RBC: 3.01 MIL/uL — ABNORMAL LOW (ref 4.22–5.81)
RDW: 16.3 % — ABNORMAL HIGH (ref 11.5–15.5)
WBC: 9.5 10*3/uL (ref 4.0–10.5)

## 2015-01-01 LAB — BASIC METABOLIC PANEL
Anion gap: 8 (ref 5–15)
BUN: 15 mg/dL (ref 6–20)
CHLORIDE: 106 mmol/L (ref 101–111)
CO2: 26 mmol/L (ref 22–32)
CREATININE: 0.87 mg/dL (ref 0.61–1.24)
Calcium: 8.6 mg/dL — ABNORMAL LOW (ref 8.9–10.3)
GFR calc Af Amer: >60 mL/min (ref >60)
GFR calc non Af Amer: >60 mL/min (ref >60)
Glucose, Bld: 133 mg/dL — ABNORMAL HIGH (ref 65–99)
Potassium: 4.2 mmol/L (ref 3.5–5.1)
SODIUM: 140 mmol/L (ref 135–145)

## 2015-01-01 LAB — TSH: TSH: 2.298 u[IU]/mL (ref 0.350–4.500)

## 2015-01-01 LAB — TYPE AND SCREEN
ABO/RH(D): B POS
ANTIBODY SCREEN: NEGATIVE
UNIT DIVISION: 0
UNIT DIVISION: 0
UNIT DIVISION: 0
Unit division: 0

## 2015-01-01 LAB — GLUCOSE, CAPILLARY
GLUCOSE-CAPILLARY: 187 mg/dL — AB (ref 65–99)
Glucose-Capillary: 155 mg/dL — ABNORMAL HIGH (ref 65–99)
Glucose-Capillary: 149 mg/dL — ABNORMAL HIGH (ref 65–99)
Glucose-Capillary: 193 mg/dL — ABNORMAL HIGH (ref 65–99)

## 2015-01-01 MED ORDER — FLUCONAZOLE 200 MG PO TABS
200.0000 mg | ORAL_TABLET | Freq: Once | ORAL | Status: AC
  Administered 2015-01-01: 200 mg via ORAL
  Filled 2015-01-01: qty 1

## 2015-01-01 MED ORDER — HYOSCYAMINE SULFATE 0.125 MG SL SUBL
0.2500 mg | SUBLINGUAL_TABLET | Freq: Four times a day (QID) | SUBLINGUAL | Status: DC | PRN
  Administered 2015-01-01 (×3): 0.25 mg via SUBLINGUAL
  Filled 2015-01-01 (×5): qty 2

## 2015-01-01 MED ORDER — DEXTROSE 5 % IV SOLN
1.0000 g | Freq: Every day | INTRAVENOUS | Status: DC
  Administered 2015-01-01 – 2015-01-04 (×4): 1 g via INTRAVENOUS
  Filled 2015-01-01 (×4): qty 10

## 2015-01-01 MED ORDER — FLUCONAZOLE 100 MG PO TABS
100.0000 mg | ORAL_TABLET | Freq: Every day | ORAL | Status: DC
  Administered 2015-01-02 – 2015-01-04 (×3): 100 mg via ORAL
  Filled 2015-01-01 (×3): qty 1

## 2015-01-01 NOTE — Progress Notes (Signed)
  Subjective: Patient reports intermittent suprapubic discomfort/bladder spasms.  Objective: Vital signs in last 24 hours: Temp:  [98.1 F (36.7 C)-99 F (37.2 C)] 98.1 F (36.7 C) (12/01 0541) Pulse Rate:  [82-86] 84 (12/01 0541) Resp:  [18] 18 (12/01 0541) BP: (132-169)/(55-66) 169/66 mmHg (12/01 0541) SpO2:  [96 %-100 %] 99 % (12/01 0541)  Intake/Output from previous day: 11/30 0701 - 12/01 0700 In: 1485 [P.O.:1200] Out: 7400 [Urine:7400] Intake/Output this shift:    Physical Exam:  Awake, alert and in mild distress. Mild SP tenderness but no mass.  Lab Results:  Recent Labs  12/30/14 0503 12/31/14 0423 01/01/15 0441  HGB 7.2* 8.3* 8.2*  HCT 22.2* 25.0* 25.0*   BMET  Recent Labs  12/31/14 0423 01/01/15 0441  NA 142 140  K 4.3 4.2  CL 107 106  CO2 27 26  GLUCOSE 132* 133*  BUN 14 15  CREATININE 0.82 0.87  CALCIUM 8.6* 8.6*   No results for input(s): LABPT, INR in the last 72 hours. No results for input(s): LABURIN in the last 72 hours. No results found for this or any previous visit.  Studies/Results: Dg Chest Port 1 View  12/31/2014  CLINICAL DATA:  Productive cough starting Sunday EXAM: PORTABLE CHEST 1 VIEW COMPARISON:  12/12/2014 FINDINGS: Cardiomediastinal silhouette is stable. Atherosclerotic calcifications of thoracic aorta again noted. No acute infiltrate or pleural effusion. No pulmonary edema. Degenerative changes bilateral shoulders. IMPRESSION: No active disease. Electronically Signed   By: Lahoma Crocker M.D.   On: 12/31/2014 12:50    Assessment/Plan: Hemorrhagic radiation cystitis: I initially ordered alum installation yesterday however the pharmacy called to inform me that this is no longer available. She did receive a single installation of carboprost yesterday however he continues to have bleeding and intermittent clots. According to the nursing staff the clots are decreasing in size so there may be some improvement occurring however he is  still having gross hematuria. I am going to try a different regimen of carboprost installation today however have a have discussed with him the fact that if there is not significant improvement by tomorrow I will take him to the operating room and attempt to fulgurate any areas of bleeding.  50 mL of Carboprost 1 mg percent solution instilled in the bladder and left indwelling for one hour, drained and repeat a second installation. This will be performed 4 times a day.  I will make him NPO after midnight for possible cystoscopy and fulguration tomorrow.   LOS: 4 days   Laruen Risser C 01/01/2015, 7:21 AM

## 2015-01-01 NOTE — Progress Notes (Signed)
ANTIBIOTIC CONSULT NOTE - INITIAL  Pharmacy Consult for ceftriaxone Indication: UTI  No Known Allergies  Patient Measurements: Height: 6' (182.9 cm) Weight: 229 lb 4.5 oz (104 kg) IBW/kg (Calculated) : 77.6 Adjusted Body Weight:   Vital Signs: Temp: 98.1 F (36.7 C) (12/01 0541) Temp Source: Oral (12/01 0541) BP: 169/66 mmHg (12/01 0541) Pulse Rate: 84 (12/01 0541) Intake/Output from previous day: 11/30 0701 - 12/01 0700 In: 1485 [P.O.:1200] Out: 7400 [Urine:7400] Intake/Output from this shift: Total I/O In: -  Out: 2900 [Urine:2900]  Labs:  Recent Labs  12/30/14 0503 12/31/14 0423 01/01/15 0441  WBC 9.4 9.2 9.5  HGB 7.2* 8.3* 8.2*  PLT 239 225 261  CREATININE 0.84 0.82 0.87   Estimated Creatinine Clearance: 91.5 mL/min (by C-G formula based on Cr of 0.87). No results for input(s): VANCOTROUGH, VANCOPEAK, VANCORANDOM, GENTTROUGH, GENTPEAK, GENTRANDOM, TOBRATROUGH, TOBRAPEAK, TOBRARND, AMIKACINPEAK, AMIKACINTROU, AMIKACIN in the last 72 hours.   Microbiology: No results found for this or any previous visit (from the past 720 hour(s)).  Medical History: Past Medical History  Diagnosis Date  . Prostate cancer ([place]) 03/2013    had seed implant and radiation for 5 weeks  . Hyperlipidemia   . Hypertension   . Type 2 diabetes mellitus ([place])   . GERD (gastroesophageal reflux disease)   . Wears glasses   . Arthritis    Assessment: 53 YOM with prostate cancer s/p cystoscopy 3 weeks ago.  Plan to start ceftriaxone for possible UTI.  Likely hematuria secondary to radiation cystitis. Yeast on UA from 11/27, treating as patient is to undergo cystoscopy.  12/1 >> ceftriaxone  >> 12/1 >> fluconazole (MD) >>  No cultures  Dose changes/levels:  Goal of Therapy:  Dose for indication and for patient-specific parameters  Plan:   Ceftriaxone 1gm IV q24h for UTI, does not require renal dose adjustment  Will sign off at this time.  Please reconsult if a change in  clinical status warrants re-evaluation of dosage.   Thank you, Doreene Eland, PharmD, BCPS.   Pager: [phone] 01/01/2015 3:52 PM

## 2015-01-01 NOTE — Care Management Note (Signed)
Case Management Note  Patient Details  Name: [name] MRN: [id number] Date of Birth: July 15, 1940  Subjective/Objective:   Urology following-CBI, cystoscopy in am.                  Action/Plan:d/c plan home.   Expected Discharge Date:                  Expected Discharge Plan:  Home/Self Care  In-House Referral:     Discharge planning Services  CM Consult  Post Acute Care Choice:    Choice offered to:     DME Arranged:    DME Agency:     HH Arranged:    HH Agency:     Status of Service:  In process, will continue to follow  Medicare Important Message Given:  Yes Date Medicare IM Given:    Medicare IM give by:    Date Additional Medicare IM Given:    Additional Medicare Important Message give by:     If discussed at [place] of Stay Meetings, dates discussed:    Additional Comments:  Dessa Phi, RN 01/01/2015, 2:01 PM

## 2015-01-01 NOTE — Progress Notes (Signed)
[facility] PROGRESS NOTE  DEONDRAE MCGRAIL JIR:[id number] DOB: 1940-06-21 DOA: 12/28/2014 PCP: Kandice Hams, MD Interim summary: Patient is a 74 yo male with history of prostate cancer Gleason 4+3 s/p radiation in May 2015, has been doing well until 5 weeks ago when he started having hematuria. 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 His hematuria hasn't improved since then. He was admitted on 11/27 and CBI started. Urology consulted and recommended stopping the CBI today and watch for improving hematuria.   Assessment/Plan: 1. 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 Persistent hematuria, i have discussed with Dr. Loel Lofty, plan to start rocephin empirically, also treat yeast in urine with diflucan, OR tomorrow,  Will likely need outpatient hyperbaric treatment. morphine stopped per patient's wife preference, continue prn hyoscyamine, tylenol for pain  2. Anemia: from anemia of blood loss.  S/p 4units prbc on 11/27, then 1unit of 11/29, transfuse prn to keep hgb >8.  3. Cough: cxr no acute findings,continue incentive spirometer for now   4. Prostate cancer: Further management as per oncology.   5. Diabetes Mellitus: CBG (last 3)   Recent Labs  12/31/14 2126 01/01/15 0811 01/01/15 1140  GLUCAP 167* 155* 149*    hgba1c is pending. Resume SSI.  Decrease the dose of insulin to half his home dose, continue adjust insulin.   6. Hypertension:  On altace,    Code Status: DNR Family Communication: patient , wife over the phone, Disposition Plan:pending PT EVAL. Home when hematuria improves.    Consultants:  UROLOGY. `  Procedures:  CBI  prbc x4units on 11/27, then 1unit on 11/29   Antibiotics:  none  HPI/Subjective: Persistent hematuria, had one time bladder spasm this am, otherwise stable, sitting up in chair.  Objective: Filed Vitals:   12/31/14 2131 01/01/15 0541  BP: 132/55 169/66  Pulse: 82 84   Temp: 98.4 F (36.9 C) 98.1 F (36.7 C)  Resp: 18 18    Intake/Output Summary (Last 24 hours) at 01/01/15 1513 Last data filed at 01/01/15 1454  Gross per 24 hour  Intake   1005 ml  Output   9150 ml  Net  -8145 ml   Filed Weights   12/28/14 2005  Weight: 229 lb 4.5 oz (104 kg)    Exam:   General:  Alert afebrile comfortable.   Cardiovascular: s1s2  Respiratory: mild crackles left lung base, otherwise, no rhonchi, no wheezing  Abdomen: soft NT nd bs+  Musculoskeletal: no pedal edema.   Data Reviewed: Basic Metabolic Panel:  Recent Labs Lab 12/28/14 1708 12/29/14 0446 12/30/14 0503 12/31/14 0423 01/01/15 0441  NA 134* 139 139 142 140  K 4.6 4.1 4.1 4.3 4.2  CL 100* 104 104 107 106  CO2 '22 27 28 27 26  '$ GLUCOSE 207* 106* 57* 132* 133*  BUN '20 16 15 14 15  '$ CREATININE 1.09 0.88 0.84 0.82 0.87  CALCIUM 9.1 9.0 8.6* 8.6* 8.6*   Liver Function Tests:  Recent Labs Lab 12/29/14 0446  AST 31  ALT 33  ALKPHOS 47  BILITOT 0.6  PROT 5.9*  ALBUMIN 3.7   No results for input(s): LIPASE, AMYLASE in the last 168 hours. No results for input(s): AMMONIA in the last 168 hours. CBC:  Recent Labs Lab 12/28/14 1708 12/29/14 0446 12/30/14 0503 12/31/14 0423 01/01/15 0441  WBC 11.5* 10.6* 9.4 9.2 9.5  NEUTROABS  --  7.8* 6.9  --   --   HGB 6.9* 7.9* 7.2* 8.3* 8.2*  HCT 21.3* 24.0* 22.2* 25.0* 25.0*  MCV 78.6 79.2 81.6 81.7 83.1  PLT 272 236 239 225 261   Cardiac Enzymes: No results for input(s): CKTOTAL, CKMB, CKMBINDEX, TROPONINI in the last 168 hours. BNP (last 3 results) No results for input(s): BNP in the last 8760 hours.  ProBNP (last 3 results) No results for input(s): PROBNP in the last 8760 hours.  CBG:  Recent Labs Lab 12/31/14 1141 12/31/14 1739 12/31/14 2126 01/01/15 0811 01/01/15 1140  GLUCAP 163* 191* 167* 155* 149*    No results found for this or any previous visit (from the past 240 hour(s)).   Studies: Dg Chest Port 1  View  12/31/2014  CLINICAL DATA:  Productive cough starting Sunday EXAM: PORTABLE CHEST 1 VIEW COMPARISON:  12/12/2014 FINDINGS: Cardiomediastinal silhouette is stable. Atherosclerotic calcifications of thoracic aorta again noted. No acute infiltrate or pleural effusion. No pulmonary edema. Degenerative changes bilateral shoulders. IMPRESSION: No active disease. Electronically Signed   By: Lahoma Crocker M.D.   On: 12/31/2014 12:50    Scheduled Meds: . sodium chloride   Intravenous Once  . atorvastatin  40 mg Oral q morning - 10a  . carboprost (HEMABATE) bladder irrigation - continuous   Bladder Irrigation Q24H   And  . sodium chloride irrigation  3,000 mL Bladder Irrigation Q24H  . insulin aspart  0-9 Units Subcutaneous TID WC  . insulin aspart protamine- aspart  20 Units Subcutaneous BID WC  . pantoprazole  40 mg Oral Daily  . ramipril  10 mg Oral q morning - 10a  . sodium chloride  3 mL Intravenous Q12H   Continuous Infusions:   Active Problems:   Acute blood loss anemia   Hematuria   Pneumonia    Time spent: 35 minutes.     Michaiah Holsopple MD PhD  [facility] Pager [phone] If 7PM-7AM, please contact night-coverage at [email or web address], password [facility] 01/01/2015, 3:13 PM  LOS: 4 days

## 2015-01-01 NOTE — Progress Notes (Signed)
Pt reporting buildup of pressure and leaking around catheter site. Irrigated with 110cc fluids, 150 cc returned with 2 small clots. Will continue to monitor.  Adah Salvage, RN

## 2015-01-01 NOTE — Progress Notes (Signed)
Pt complaining of bladder spasms, contacted on-call and told to report of to day shift. Will let on-coming nurse know and continue to monitor.

## 2015-01-01 NOTE — Progress Notes (Signed)
Pt reported pressure around catheter twice overnight, RN manually irrigated both times. Clots still present, but appear to be fewer of them. Pt reported the clots "look better than they did during the day." Will continue to monitor.  Adah Salvage, RN

## 2015-01-01 NOTE — Progress Notes (Addendum)
Physical Therapy Treatment Patient Details Name: [name] MRN: [id number] DOB: 11-06-1940 Today's Date: 01/01/2015    History of Present Illness 74 yo male admitted with anemia, hematuria. Hx of prostate cancer.     PT Comments    Pt continues to c/o L knee pain. Able to increase ambulation distance slightly on today. Min assist without support of walker, Min guard assist with walker. Recommend HHPT and RW use at discharge.   Follow Up Recommendations  Home health PT;Supervision - Intermittent     Equipment Recommendations  None recommended by PT    Recommendations for Other Services       Precautions / Restrictions Precautions Precautions: Fall Restrictions Weight Bearing Restrictions: No    Mobility  Bed Mobility               General bed mobility comments: oob in recliner  Transfers Overall transfer level: Needs assistance   Transfers: Sit to/from Stand Sit to Stand: Min assist         General transfer comment: Assist to stabilize when pt does not have support.   Ambulation/Gait Ambulation/Gait assistance: Min guard Ambulation Distance (Feet): 200 Feet Assistive device: Rolling walker (2 wheeled) Gait Pattern/deviations: Step-through pattern;Decreased stride length;Antalgic     General Gait Details: Pt c/o L knee pain. Began ambulation with pt holdng on to IV pole. Once in hallway pt needed support of hallway handrail in addition to pole. Switched to [employer] guard assist with use of walker.    Stairs            Wheelchair Mobility    Modified Rankin (Stroke Patients Only)       Balance                                    Cognition Arousal/Alertness: Awake/alert Behavior During Therapy: WFL for tasks assessed/performed Overall Cognitive Status: Within Functional Limits for tasks assessed                      Exercises  LAQs, 10 reps, both, AROM Heel slides, 10 reps, AROM/AAROM(L)    General  Comments        Pertinent Vitals/Pain Pain Assessment: Faces Faces Pain Scale: Hurts even more Pain Location: L knee. Bladder spasms Pain Descriptors / Indicators: Spasm;Aching;Sore Pain Intervention(s): Monitored during session;Repositioned    Home Living                      Prior Function            PT Goals (current goals can now be found in the care plan section) Progress towards PT goals: Progressing toward goals    Frequency  Min 3X/week    PT Plan Current plan remains appropriate    Co-evaluation             End of Session Equipment Utilized During Treatment: Gait belt Activity Tolerance: Patient tolerated treatment well Patient left: in chair;with call bell/phone within reach;with chair alarm set     Time: 6415-8309 PT Time Calculation (min) (ACUTE ONLY): 16 min  Charges:  $Gait Training: 8-22 mins                    G Codes:      Weston Anna, MPT Pager: [phone]

## 2015-01-02 ENCOUNTER — Encounter (HOSPITAL_COMMUNITY)
Admission: EM | Disposition: A | Discharge status: Home Health | Payer: Self-pay | Source: Home / Self Care | Attending: Internal Medicine

## 2015-01-02 ENCOUNTER — Inpatient Hospital Stay (HOSPITAL_COMMUNITY): Payer: Medicare Other | Admitting: Registered Nurse

## 2015-01-02 ENCOUNTER — Encounter (HOSPITAL_COMMUNITY): Payer: Self-pay | Admitting: Registered Nurse

## 2015-01-02 DIAGNOSIS — R531 Weakness: Secondary | ICD-10-CM

## 2015-01-02 HISTORY — PX: TRANSURETHRAL RESECTION OF BLADDER TUMOR: SHX2575

## 2015-01-02 LAB — BASIC METABOLIC PANEL
ANION GAP: 7 (ref 5–15)
BUN: 18 mg/dL (ref 6–20)
CALCIUM: 8.4 mg/dL — AB (ref 8.9–10.3)
CO2: 26 mmol/L (ref 22–32)
CREATININE: 0.85 mg/dL (ref 0.61–1.24)
Chloride: 101 mmol/L (ref 101–111)
GFR calc Af Amer: >60 mL/min (ref >60)
GLUCOSE: 185 mg/dL — AB (ref 65–99)
Potassium: 4 mmol/L (ref 3.5–5.1)
Sodium: 134 mmol/L — ABNORMAL LOW (ref 135–145)

## 2015-01-02 LAB — SURGICAL PCR SCREEN
MRSA, PCR: NEGATIVE
STAPHYLOCOCCUS AUREUS: NEGATIVE

## 2015-01-02 LAB — CBC
HCT: 19.7 % — ABNORMAL LOW (ref 39.0–52.0)
Hemoglobin: 6.5 g/dL — CL (ref 13.0–17.0)
MCH: 27.1 pg (ref 26.0–34.0)
MCHC: 33 g/dL (ref 30.0–36.0)
MCV: 82.1 fL (ref 78.0–100.0)
PLATELETS: 218 10*3/uL (ref 150–400)
RBC: 2.4 MIL/uL — ABNORMAL LOW (ref 4.22–5.81)
RDW: 16 % — AB (ref 11.5–15.5)
WBC: 8.4 10*3/uL (ref 4.0–10.5)

## 2015-01-02 LAB — PREPARE RBC (CROSSMATCH)

## 2015-01-02 LAB — GLUCOSE, CAPILLARY
GLUCOSE-CAPILLARY: 251 mg/dL — AB (ref 65–99)
Glucose-Capillary: 146 mg/dL — ABNORMAL HIGH (ref 65–99)
Glucose-Capillary: 133 mg/dL — ABNORMAL HIGH (ref 65–99)
Glucose-Capillary: 148 mg/dL — ABNORMAL HIGH (ref 65–99)
Glucose-Capillary: 151 mg/dL — ABNORMAL HIGH (ref 65–99)

## 2015-01-02 LAB — HEMOGLOBIN A1C
HEMOGLOBIN A1C: 6.5 % — AB (ref 4.8–5.6)
MEAN PLASMA GLUCOSE: 140 mg/dL

## 2015-01-02 SURGERY — TURBT (TRANSURETHRAL RESECTION OF BLADDER TUMOR)
Anesthesia: General

## 2015-01-02 MED ORDER — PROPOFOL 10 MG/ML IV BOLUS
INTRAVENOUS | Status: AC
  Filled 2015-01-02: qty 20

## 2015-01-02 MED ORDER — ONDANSETRON HCL 4 MG/2ML IJ SOLN
INTRAMUSCULAR | Status: DC | PRN
Start: 2015-01-02 — End: 2015-01-02
  Administered 2015-01-02: 4 mg via INTRAVENOUS

## 2015-01-02 MED ORDER — LACTATED RINGERS IV SOLN
INTRAVENOUS | Status: DC
  Administered 2015-01-02: 1000 mL via INTRAVENOUS

## 2015-01-02 MED ORDER — PROMETHAZINE HCL 25 MG/ML IJ SOLN: 6.2500 mg | INTRAMUSCULAR | Status: DC | PRN

## 2015-01-02 MED ORDER — PHENYLEPHRINE 40 MCG/ML (10ML) SYRINGE FOR IV PUSH (FOR BLOOD PRESSURE SUPPORT)
PREFILLED_SYRINGE | INTRAVENOUS | Status: AC
  Filled 2015-01-02: qty 10

## 2015-01-02 MED ORDER — FENTANYL CITRATE (PF) 100 MCG/2ML IJ SOLN
25.0000 ug | INTRAMUSCULAR | Status: DC | PRN
  Administered 2015-01-02 (×2): 25 ug via INTRAVENOUS
  Administered 2015-01-02: 50 ug via INTRAVENOUS

## 2015-01-02 MED ORDER — EPHEDRINE SULFATE 50 MG/ML IJ SOLN
INTRAMUSCULAR | Status: DC | PRN
  Administered 2015-01-02 (×4): 5 mg via INTRAVENOUS

## 2015-01-02 MED ORDER — FENTANYL CITRATE (PF) 250 MCG/5ML IJ SOLN
INTRAMUSCULAR | Status: AC
Start: 2015-01-02 — End: 2015-01-02
  Filled 2015-01-02: qty 5

## 2015-01-02 MED ORDER — LIDOCAINE HCL (CARDIAC) 20 MG/ML IV SOLN
INTRAVENOUS | Status: DC | PRN
  Administered 2015-01-02: 100 mg via INTRAVENOUS

## 2015-01-02 MED ORDER — PROPOFOL 10 MG/ML IV BOLUS
INTRAVENOUS | Status: DC | PRN
  Administered 2015-01-02: 200 mg via INTRAVENOUS
  Administered 2015-01-02: 50 mg via INTRAVENOUS

## 2015-01-02 MED ORDER — PHENYLEPHRINE HCL 10 MG/ML IJ SOLN
INTRAMUSCULAR | Status: DC | PRN
  Administered 2015-01-02: 80 ug via INTRAVENOUS
  Administered 2015-01-02: 120 ug via INTRAVENOUS
  Administered 2015-01-02 (×2): 80 ug via INTRAVENOUS
  Administered 2015-01-02: 120 ug via INTRAVENOUS

## 2015-01-02 MED ORDER — BELLADONNA ALKALOIDS-OPIUM 16.2-60 MG RE SUPP
RECTAL | Status: AC
  Filled 2015-01-02: qty 1

## 2015-01-02 MED ORDER — SODIUM CHLORIDE 0.9 % IV SOLN: Freq: Once | INTRAVENOUS | Status: DC

## 2015-01-02 MED ORDER — SODIUM CHLORIDE 0.9 % IR SOLN
Status: DC | PRN
  Administered 2015-01-02: 3000 mL

## 2015-01-02 MED ORDER — FENTANYL CITRATE (PF) 100 MCG/2ML IJ SOLN
INTRAMUSCULAR | Status: DC | PRN
  Administered 2015-01-02 (×4): 50 ug via INTRAVENOUS

## 2015-01-02 MED ORDER — LIDOCAINE HCL (CARDIAC) 20 MG/ML IV SOLN
INTRAVENOUS | Status: AC
  Filled 2015-01-02: qty 5

## 2015-01-02 MED ORDER — FENTANYL CITRATE (PF) 100 MCG/2ML IJ SOLN
INTRAMUSCULAR | Status: AC
  Filled 2015-01-02: qty 2

## 2015-01-02 MED ORDER — ONDANSETRON HCL 4 MG/2ML IJ SOLN
INTRAMUSCULAR | Status: AC
  Filled 2015-01-02: qty 2

## 2015-01-02 MED ORDER — BELLADONNA ALKALOIDS-OPIUM 16.2-60 MG RE SUPP
RECTAL | Status: DC | PRN
  Administered 2015-01-02: 1 via RECTAL

## 2015-01-02 MED ORDER — FENTANYL CITRATE (PF) 250 MCG/5ML IJ SOLN
INTRAMUSCULAR | Status: AC
  Filled 2015-01-02: qty 5

## 2015-01-02 MED ORDER — MIDAZOLAM HCL 2 MG/2ML IJ SOLN
INTRAMUSCULAR | Status: AC
  Filled 2015-01-02: qty 2

## 2015-01-02 MED ORDER — LACTATED RINGERS IV SOLN
INTRAVENOUS | Status: DC | PRN
  Administered 2015-01-02: 13:00:00 via INTRAVENOUS

## 2015-01-02 SURGICAL SUPPLY — 21 items
BAG URINE DRAINAGE (UROLOGICAL SUPPLIES) ×2 IMPLANT
BAG URO CATCHER STRL LF (DRAPE) ×2 IMPLANT
CATH FOLEY 3WAY 30CC 24FR (CATHETERS) ×1
CATH URTH STD 24FR FL 3W 2 (CATHETERS) ×1 IMPLANT
DRAPE CAMERA CLOSED 9X96 (DRAPES) IMPLANT
ELECT BIVAP BIPO 22/24 DONUT (ELECTROSURGICAL) ×2
ELECT REM PT RETURN 9FT ADLT (ELECTROSURGICAL)
ELECTRD BIVAP BIPO 22/24 DONUT (ELECTROSURGICAL) ×1 IMPLANT
ELECTRODE REM PT RTRN 9FT ADLT (ELECTROSURGICAL) IMPLANT
EVACUATOR MICROVAS BLADDER (UROLOGICAL SUPPLIES) IMPLANT
GLOVE BIOGEL M 8.0 STRL (GLOVE) ×2 IMPLANT
GOWN STRL REUS W/TWL XL LVL3 (GOWN DISPOSABLE) ×4 IMPLANT
HOLDER FOLEY CATH W/STRAP (MISCELLANEOUS) ×2 IMPLANT
KIT ASPIRATION TUBING (SET/KITS/TRAYS/PACK) ×2 IMPLANT
LOOP CUT BIPOLAR 24F LRG (ELECTROSURGICAL) IMPLANT
MANIFOLD NEPTUNE II (INSTRUMENTS) ×2 IMPLANT
NS IRRIG 1000ML POUR BTL (IV SOLUTION) ×2 IMPLANT
PACK CYSTO (CUSTOM PROCEDURE TRAY) ×2 IMPLANT
SYR 30ML LL (SYRINGE) ×2 IMPLANT
SYRINGE IRR TOOMEY STRL 70CC (SYRINGE) ×2 IMPLANT
TUBING CONNECTING 10 (TUBING) ×2 IMPLANT

## 2015-01-02 NOTE — Transfer of Care (Signed)
Immediate Anesthesia Transfer of Care Note  Patient: [name]  Procedure(s) Performed: Procedure(s): Cystoscopy clot evalucation and fulgration (N/A)  Patient Location: PACU  Anesthesia Type:General  Level of Consciousness: awake, alert , oriented and patient cooperative  Airway & Oxygen Therapy: Patient Spontanous Breathing and Patient connected to face mask oxygen  Post-op Assessment: Report given to RN, Post -op Vital signs reviewed and stable and Patient moving all extremities  Post vital signs: Reviewed and stable  Last Vitals:  Filed Vitals:   01/02/15 1105 01/02/15 1136  BP: 102/48 111/57  Pulse: 92 80  Temp: 37 C 37.1 C  Resp: 18 18    Complications: No apparent anesthesia complications

## 2015-01-02 NOTE — Progress Notes (Signed)
Day RN reports patient needs to have one more round of the Hemabate instilled per care order instructions. Writer Started process at 2040 per instructions instilled 29m's of Hemabate into bladder over 30 minutes, clamped foley for one hour, after one hour unclamped foley, instilled another 565ms of Hemabate over 30 minutes into bladder, clamped foley for one hour, after one hour unclamped foley. After unclamping foley for second time writer changed patient back to NS CBI at 10023mr. Will continue to monitor.

## 2015-01-02 NOTE — Progress Notes (Signed)
Patient is awake, alert and in no discomfort.  He is pleased with the outcome of his surgery.  AVSS  His urine is clear on a very slow CBI drip.  I shut off his CBI Will plan to remove his Foley at 8 PM as long as his urine remains clear. Could potentially be discharged in a.m

## 2015-01-02 NOTE — Anesthesia Procedure Notes (Signed)
Procedure Name: LMA Insertion Date/Time: 01/02/2015 1:11 PM Performed by: Carleene Cooper A Pre-anesthesia Checklist: Patient identified, Emergency Drugs available, Suction available and Patient being monitored Patient Re-evaluated:Patient Re-evaluated prior to inductionOxygen Delivery Method: Circle System Utilized Preoxygenation: Pre-oxygenation with 100% oxygen Intubation Type: IV induction LMA: LMA inserted LMA Size: 5.0 Tube type: Oral Number of attempts: 1 Placement Confirmation: ETT inserted through vocal cords under direct vision,  positive ETCO2 and breath sounds checked- equal and bilateral Tube secured with: Tape Dental Injury: Teeth and Oropharynx as per pre-operative assessment

## 2015-01-02 NOTE — Progress Notes (Signed)
  Subjective: Patient reports no new complaints today.  Objective: Vital signs in last 24 hours: Temp:  [98 F (36.7 C)-98.4 F (36.9 C)] 98 F (36.7 C) (12/02 0630) Pulse Rate:  [89-98] 98 (12/02 0630) Resp:  [18] 18 (12/02 0630) BP: (120-173)/(65-66) 120/66 mmHg (12/02 0630) SpO2:  [99 %] 99 % (12/02 0630)  Intake/Output from previous day: 12/01 0701 - 12/02 0700 In: 1020 [P.O.:360; IV Piggyback:50] Out: 1657 [Urine:5940] Intake/Output this shift:    Physical Exam:  His Foley catheter is draining but remains quite bloody.  Lab Results:  Recent Labs  12/31/14 0423 01/01/15 0441 01/02/15 0531  HGB 8.3* 8.2* 6.5*  HCT 25.0* 25.0* 19.7*   BMET  Recent Labs  01/01/15 0441 01/02/15 0531  NA 140 134*  K 4.2 4.0  CL 106 101  CO2 26 26  GLUCOSE 133* 185*  BUN 15 18  CREATININE 0.87 0.85  CALCIUM 8.6* 8.4*   No results for input(s): LABPT, INR in the last 72 hours. No results for input(s): LABURIN in the last 72 hours. No results found for this or any previous visit.  Studies/Results: Dg Chest Port 1 View  12/31/2014  CLINICAL DATA:  Productive cough starting Sunday EXAM: PORTABLE CHEST 1 VIEW COMPARISON:  12/12/2014 FINDINGS: Cardiomediastinal silhouette is stable. Atherosclerotic calcifications of thoracic aorta again noted. No acute infiltrate or pleural effusion. No pulmonary edema. Degenerative changes bilateral shoulders. IMPRESSION: No active disease. Electronically Signed   By: Lahoma Crocker M.D.   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 Conservative therapies including continuous bladder irrigation and instillation of carboprost have been ineffective at reducing his hematuria. I have therefore discussed  with him proceeding with cystoscopic evaluation with an attempt to try and fulgurate any areas found to be bleeding at the time. I have gone over the procedure with him in detail including the risks and complications, the alternatives, the probability of success as well as the anticipated postoperative course. His questions have been answered and he understands and elected to proceed.  While he did have a culture proven UTI last month when he was seen as an outpatient in my office this was treated with appropriate antibiotic therapy. No culture was performed upon admission and it was felt that now that he has been on CBI at a culture would likely not be accurate. He was therefore empirically started on Rocephin, to which his previous infection was sensitive, as well as fluconazole as there were used present on his urinalysis at the time of admission.   1. Antibiotics and antifungals as above 2. I will take him to the operating room today for cystoscopy, clot evacuation and fulguration.   LOS: 5 days   [name] C 01/02/2015, 7:45 AM

## 2015-01-02 NOTE — Anesthesia Preprocedure Evaluation (Addendum)
Anesthesia Evaluation  Patient identified by MRN, date of birth, ID band Patient awake    Reviewed: Allergy & Precautions, NPO status , Patient's Chart, lab work & pertinent test results  Airway Mallampati: II  TM Distance: >3 FB Neck ROM: Full    Dental   Pulmonary former smoker,    breath sounds clear to auscultation       Cardiovascular hypertension, Pt. on medications  Rhythm:Regular Rate:Normal     Neuro/Psych negative neurological ROS     GI/Hepatic Neg liver ROS, GERD  ,  Endo/Other  diabetes, Type 2, Insulin Dependent  Renal/GU negative Renal ROS     Musculoskeletal  (+) Arthritis ,   Abdominal   Peds  Hematology  (+) anemia ,   Anesthesia Other Findings   Reproductive/Obstetrics                            Lab Results  Component Value Date   WBC 8.4 01/02/2015   HGB 6.5* 01/02/2015   HCT 19.7* 01/02/2015   MCV 82.1 01/02/2015   PLT 218 01/02/2015   Lab Results  Component Value Date   CREATININE 0.85 01/02/2015   BUN 18 01/02/2015   NA 134* 01/02/2015   K 4.0 01/02/2015   CL 101 01/02/2015   CO2 26 01/02/2015   Lab Results  Component Value Date   INR 1.08 12/28/2014   INR 0.98 06/28/2013    Anesthesia Physical Anesthesia Plan  ASA: IV  Anesthesia Plan: General   Post-op Pain Management:    Induction: Intravenous  Airway Management Planned: LMA  Additional Equipment:   Intra-op Plan:   Post-operative Plan:   Informed Consent: I have reviewed the patients History and Physical, chart, labs and discussed the procedure including the risks, benefits and alternatives for the proposed anesthesia with the patient or authorized representative who has indicated his/her understanding and acceptance.   Dental advisory given  Plan Discussed with: CRNA  Anesthesia Plan Comments:         Anesthesia Quick Evaluation

## 2015-01-02 NOTE — Progress Notes (Signed)
CRITICAL VALUE ALERT  Critical value received:  Hgb: 6.5  Date of notification:  01/02/2015  Time of notification:  6837  Critical value read back:Yes.    Nurse who received alert:  Raynelle Jan, RN  MD notified (1st page):  Tylene Fantasia, NP  Time of first page:  [phone]  MD notified (2nd page):  Time of second page:  Responding MD:  Tylene Fantasia, NP  Time MD responded:  [phone]

## 2015-01-02 NOTE — Progress Notes (Signed)
[facility] PROGRESS NOTE  [name] EUM:[id number] DOB: November 28, 1940 DOA: 12/28/2014 PCP: Kandice Hams, MD Interim summary: Patient is a 74 yo male with history of prostate cancer Gleason 4+3 s/p radiation in May 2015, has been doing well until 5 weeks ago when he started having hematuria. 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 His hematuria hasn't improved since then. He was admitted on 11/27 and CBI started. Urology consulted and recommended stopping the CBI today and watch for improving hematuria.   Assessment/Plan: 1. 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  Persistent hematuria,  started rocephin empirically, also treat yeast in urine with diflucan, Will likely need outpatient hyperbaric treatment. morphine stopped per patient's wife preference, continue prn hyoscyamine, tylenol for pain OR 12/2  2. Anemia: from anemia of blood loss.   transfuse prn to keep hgb >8.  3. Cough: cxr no acute findings,continue incentive spirometer for now   4. Prostate cancer: Further management as per oncology.   5. Diabetes Mellitus: CBG (last 3)   Recent Labs  01/02/15 1136 01/02/15 1442 01/02/15 1634  GLUCAP 133* 148* 151*    hgba1c is 6.5. Resume SSI.  Decrease the dose of insulin to half his home dose, continue adjust insulin.   6. Hypertension:  On altace,    Code Status: DNR Family Communication: patient , wife over the phone, Disposition Plan: . Home with home health when hematuria improves.    Consultants:  UROLOGY. `  Procedures:  CBI  prbc x4units on 11/27, then 1unit on 11/29 , 40moe unit 12/2 12/2: 1. Cystoscopy with clot evacuation 2. Transurethral fulguration of bladder neck bleeding.  Antibiotics:  Rocephin from 12/1  Diflucan from 12/1  HPI/Subjective: hgb dropped to 6.5, requiring transfusion, To OR today, denies chest pain, no sob  Objective: Filed Vitals:   01/02/15 1500  01/02/15 1517  BP: 122/58 122/59  Pulse: 100 80  Temp: 98 F (36.7 C)   Resp: 15 18    Intake/Output Summary (Last 24 hours) at 01/02/15 1941 Last data filed at 01/02/15 1600  Gross per 24 hour  Intake   4020 ml  Output   7290 ml  Net  -3270 ml   Filed Weights   12/28/14 2005  Weight: 229 lb 4.5 oz (104 kg)    Exam:   General:  Alert afebrile comfortable.   Cardiovascular: s1s2  Respiratory: mild crackles left lung base, otherwise, no rhonchi, no wheezing  Abdomen: soft NT nd bs+  Musculoskeletal: no pedal edema.   Data Reviewed: Basic Metabolic Panel:  Recent Labs Lab 12/29/14 0446 12/30/14 0503 12/31/14 0423 01/01/15 0441 01/02/15 0531  NA 139 139 142 140 134*  K 4.1 4.1 4.3 4.2 4.0  CL 104 104 107 106 101  CO2 '27 28 27 26 26  '$ GLUCOSE 106* 57* 132* 133* 185*  BUN '16 15 14 15 18  '$ CREATININE 0.88 0.84 0.82 0.87 0.85  CALCIUM 9.0 8.6* 8.6* 8.6* 8.4*   Liver Function Tests:  Recent Labs Lab 12/29/14 0446  AST 31  ALT 33  ALKPHOS 47  BILITOT 0.6  PROT 5.9*  ALBUMIN 3.7   No results for input(s): LIPASE, AMYLASE in the last 168 hours. No results for input(s): AMMONIA in the last 168 hours. CBC:  Recent Labs Lab 12/29/14 0446 12/30/14 0503 12/31/14 0423 01/01/15 0441 01/02/15 0531  WBC 10.6* 9.4 9.2 9.5 8.4  NEUTROABS 7.8* 6.9  --   --   --   HGB  7.9* 7.2* 8.3* 8.2* 6.5*  HCT 24.0* 22.2* 25.0* 25.0* 19.7*  MCV 79.2 81.6 81.7 83.1 82.1  PLT 236 239 225 261 218   Cardiac Enzymes: No results for input(s): CKTOTAL, CKMB, CKMBINDEX, TROPONINI in the last 168 hours. BNP (last 3 results) No results for input(s): BNP in the last 8760 hours.  ProBNP (last 3 results) No results for input(s): PROBNP in the last 8760 hours.  CBG:  Recent Labs Lab 01/01/15 2321 01/02/15 0739 01/02/15 1136 01/02/15 1442 01/02/15 1634  GLUCAP 193* 146* 133* 148* 151*    Recent Results (from the past 240 hour(s))  Surgical pcr screen     Status: None    Collection Time: 01/02/15  9:43 AM  Result Value Ref Range Status   MRSA, PCR NEGATIVE NEGATIVE Final   Staphylococcus aureus NEGATIVE NEGATIVE Final    Comment:        The Xpert SA Assay (FDA approved for NASAL specimens in patients over 27 years of age), is one component of a comprehensive surveillance program.  Test performance has been validated by [facility] for patients greater than or equal to 4 year old. It is not intended to diagnose infection nor to guide or monitor treatment.      Studies: No results found.  Scheduled Meds: . atorvastatin  40 mg Oral q morning - 10a  . cefTRIAXone (ROCEPHIN)  IV  1 g Intravenous Daily  . fentaNYL      . fluconazole  100 mg Oral Daily  . insulin aspart  0-9 Units Subcutaneous TID WC  . insulin aspart protamine- aspart  20 Units Subcutaneous BID WC  . pantoprazole  40 mg Oral Daily  . ramipril  10 mg Oral q morning - 10a  . sodium chloride  3 mL Intravenous Q12H  . sodium chloride irrigation  3,000 mL Bladder Irrigation Q24H   Continuous Infusions:   Active Problems:   Acute blood loss anemia   Hematuria   Pneumonia    Time spent: 35 minutes.     Malon Siddall MD PhD  [facility] Pager [phone] If 7PM-7AM, please contact night-coverage at [email or web address], password [facility] 01/02/2015, 7:41 PM  LOS: 5 days

## 2015-01-02 NOTE — Anesthesia Postprocedure Evaluation (Signed)
Anesthesia Post Note  Patient: [name]  Procedure(s) Performed: Procedure(s) (LRB): Cystoscopy clot evalucation and fulgration (N/A)  Patient location during evaluation: PACU Anesthesia Type: General Level of consciousness: awake and alert Pain management: pain level controlled Vital Signs Assessment: post-procedure vital signs reviewed and stable Respiratory status: spontaneous breathing Cardiovascular status: blood pressure returned to baseline Anesthetic complications: no    Last Vitals:  Filed Vitals:   01/02/15 1445 01/02/15 1500  BP: 127/62 122/58  Pulse: 83 100  Temp:    Resp: 16 15    Last Pain:  Filed Vitals:   01/02/15 1503  PainSc: 3                  Tiajuana Amass

## 2015-01-02 NOTE — Op Note (Signed)
PATIENT:  [name]  PRE-OPERATIVE DIAGNOSIS: Gross hematuria with bladder clots.  POST-OPERATIVE DIAGNOSIS: 1. 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 2. Bladder clots  PROCEDURE: 1. Cystoscopy with clot evacuation 2. Transurethral fulguration of bladder neck bleeding.  SURGEON:  Claybon Jabs  INDICATION: [name] is a 74 year old male who has undergone previous radiation therapy for prostate cancer. Over a month ago he began to have intermittent gross hematuria which then became more persistent. I recommended hyperbaric oxygen therapy after having placed him on finasteride he initially without improvement. He only received about 3 hyperbaric oxygen treatments and developed clot retention. A catheter was inserted and he was irrigated free of clots but continued to have difficulty with clots occluding the catheter. He eventually was admitted with blood loss anemia and required transfusions. During his hospitalization he was placed on CBI with initial improvement but persistent bleeding and then was treated with carboprost but again did not respond. He therefore is brought to the operating room for cystoscopic evaluation and treatment.  ANESTHESIA:  General  EBL:  Minimal  DRAINS: 24 French three-way Foley catheter  LOCAL MEDICATIONS USED:  None  SPECIMEN:  None  Description of procedure: After informed consent the patient was taken to the operating room and placed on the table in a supine position. General anesthesia was then administered. Once fully anesthetized the patient was moved to the dorsal lithotomy position and the genitalia were sterilely prepped and draped in standard fashion. An official timeout was then performed.  Initially I passed the 23 French cystoscope down the urethra with the 30 lens and noted no lesions of the urethra. The prostatic urethra did not have any lesions or active bleeding but as I entered the bladder I noted a great deal of  clot. I tried to evacuate this with the [facility] syringe but felt a larger bore scope would be more effective. I therefore removed the cystoscope and inserted the 26 French resectoscope with visual obturator. I was able to see a great deal of clot within the bladder but the mucosa that I was able to visualize appeared fairly normal. I therefore used the [place] syringe to evacuate a large volume of clot from the bladder and then reinspection was undertaken which revealed the bladder itself was free of any definite bleeding or any significant telangiectasias. It was difficult to identify the ureteral orifice ease but I was able to identify the trigone which was noted to be well away from the bladder neck. As I withdrew the scope back to the bladder neck and noted diffuse erythematous bleeding from the bladder neck from the 3:00 position on down around and up to the 9:00 position. It only was present at the bladder neck but there were multiple small bleeding points. I inserted the button electrode for the resectoscope and then fulgurated the entire bladder neck from the 3:00 to the 6:00 position. This stopped all of the bleeding. I made sure that his blood pressure was at his baseline and continued to observe for any bleeding and none was noted. I inspected the bladder again and noted no bleeding from the bladder mucosa. As I withdrew the scope into the prostatic urethra there was no significant erythema or telangiectasia of the vessels. I therefore removed the resectoscope. The 24 French three-way Foley catheter was then inserted and the balloon filled with 30 mL of water. Continuous irrigation was maintained and continued completely clear. He was therefore awakened and taken recovery room in stable and  satisfactory condition. He tolerated the procedure well no intraoperative complications.     PATIENT DISPOSITION:  PACU - hemodynamically stable.

## 2015-01-03 LAB — BASIC METABOLIC PANEL
Anion gap: 7 (ref 5–15)
BUN: 14 mg/dL (ref 6–20)
CO2: 29 mmol/L (ref 22–32)
Calcium: 8.1 mg/dL — ABNORMAL LOW (ref 8.9–10.3)
Chloride: 103 mmol/L (ref 101–111)
Creatinine, Ser: 0.82 mg/dL (ref 0.61–1.24)
GFR calc Af Amer: >60 mL/min (ref >60)
GLUCOSE: 137 mg/dL — AB (ref 65–99)
POTASSIUM: 4.2 mmol/L (ref 3.5–5.1)
Sodium: 139 mmol/L (ref 135–145)

## 2015-01-03 LAB — CBC
HCT: 21.4 % — ABNORMAL LOW (ref 39.0–52.0)
Hemoglobin: 6.9 g/dL — CL (ref 13.0–17.0)
MCH: 27.1 pg (ref 26.0–34.0)
MCHC: 32.2 g/dL (ref 30.0–36.0)
MCV: 83.9 fL (ref 78.0–100.0)
PLATELETS: 240 10*3/uL (ref 150–400)
RBC: 2.55 MIL/uL — AB (ref 4.22–5.81)
RDW: 15.9 % — ABNORMAL HIGH (ref 11.5–15.5)
WBC: 7.2 10*3/uL (ref 4.0–10.5)

## 2015-01-03 LAB — GLUCOSE, CAPILLARY
Glucose-Capillary: 139 mg/dL — ABNORMAL HIGH (ref 65–99)
Glucose-Capillary: 152 mg/dL — ABNORMAL HIGH (ref 65–99)
Glucose-Capillary: 172 mg/dL — ABNORMAL HIGH (ref 65–99)
Glucose-Capillary: 147 mg/dL — ABNORMAL HIGH (ref 65–99)

## 2015-01-03 LAB — MAGNESIUM: Magnesium: 1.6 mg/dL — ABNORMAL LOW (ref 1.7–2.4)

## 2015-01-03 LAB — PREPARE RBC (CROSSMATCH)

## 2015-01-03 MED ORDER — MAGNESIUM SULFATE 2 GM/50ML IV SOLN
2.0000 g | Freq: Once | INTRAVENOUS | Status: AC
  Administered 2015-01-03: 2 g via INTRAVENOUS
  Filled 2015-01-03: qty 50

## 2015-01-03 MED ORDER — SODIUM CHLORIDE 0.9 % IV SOLN
Freq: Once | INTRAVENOUS | Status: AC
  Administered 2015-01-03: 08:00:00 via INTRAVENOUS

## 2015-01-03 NOTE — Progress Notes (Signed)
1 Day Post-Op Subjective: Patient reports that he feels fine. He has voided clear urine already this morning  Objective: Vital signs in last 24 hours: Temp:  [97.8 F (36.6 C)-98.9 F (37.2 C)] 98.9 F (37.2 C) (12/03 0815) Pulse Rate:  [31-100] 80 (12/03 0815) Resp:  [13-20] 18 (12/03 0815) BP: (102-136)/(48-76) 124/54 mmHg (12/03 0815) SpO2:  [90 %-100 %] 97 % (12/03 0815)  Intake/Output from previous day: 12/02 0701 - 12/03 0700 In: 3410 [P.O.:360; I.V.:1000; IV Piggyback:50] Out: 5650 [Urine:5600; Blood:50] Intake/Output this shift: Total I/O In: 270 [P.O.:240; Blood:30] Out: -   Physical Exam:  Constitutional: Vital signs reviewed. WD WN in NAD   Eyes: PERRL, No scleral icterus.    Pulmonary/Chest: Normal effort   Lab Results:  Recent Labs  01/01/15 0441 01/02/15 0531 01/03/15 0507  HGB 8.2* 6.5* 6.9*  HCT 25.0* 19.7* 21.4*   BMET  Recent Labs  01/02/15 0531 01/03/15 0507  NA 134* 139  K 4.0 4.2  CL 101 103  CO2 26 29  GLUCOSE 185* 137*  BUN 18 14  CREATININE 0.85 0.82  CALCIUM 8.4* 8.1*   No results for input(s): LABPT, INR in the last 72 hours. No results for input(s): LABURIN in the last 72 hours. Results for orders placed or performed during the hospital encounter of 12/28/14  Surgical pcr screen     Status: None   Collection Time: 01/02/15  9:43 AM  Result Value Ref Range Status   MRSA, PCR NEGATIVE NEGATIVE Final   Staphylococcus aureus NEGATIVE NEGATIVE Final    Comment:        The Xpert SA Assay (FDA approved for NASAL specimens in patients over 81 years of age), is one component of a comprehensive surveillance program.  Test performance has been validated by [facility] for patients greater than or equal to 17 year old. It is not intended to diagnose infection nor to guide or monitor treatment.     Studies/Results: No results found.   Assessment:    Status post cystoscopy with coagulation of bleeders. He is doing  well, voiding appropriately. From a urologic standpoint, we are fine to have him discharged following transfusion   LOS: 6 days   Franchot Gallo M 01/03/2015, 8:59 AM

## 2015-01-03 NOTE — Progress Notes (Signed)
[facility] PROGRESS NOTE  ASAEL PANN YPP:[id number] DOB: 03-25-1940 DOA: 12/28/2014 PCP: Kandice Hams, MD Interim summary: Patient is a 74 yo male with history of prostate cancer Gleason 4+3 s/p radiation in May 2015, has been doing well until 5 weeks ago when he started having hematuria. 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 His hematuria hasn't improved since then. He was admitted on 11/27 and CBI started. Urology consulted and recommended stopping the CBI today and watch for improving hematuria.   Assessment/Plan: 1. 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  Persistent hematuria,  started rocephin empirically, also treat yeast in urine with diflucan, Hematuria resolved after fulguration of bladder neck bleeder in OR on 12/2, appreciate urology input.  2. Anemia: from anemia of blood loss.   transfuse prn to keep hgb >8. So far received 8units of blood, will repeat cbc in am, if stable, d/c in am.  3. Cough: cxr no acute findings,continue incentive spirometer for now   4. Prostate cancer: Further management as per oncology.   5. Diabetes Mellitus: CBG (last 3)   Recent Labs  01/02/15 1442 01/02/15 1634 01/02/15 2102  GLUCAP 148* 151* 251*    hgba1c is 6.5. Resume SSI.  Decrease the dose of insulin to half his home dose, continue adjust insulin.   6. Hypertension:  On altace,   7. Hypomagnesemia: replace mag   Code Status: DNR Family Communication: patient , wife over the phone ,discussed with her about potential discharge tomorrow if hgb stable, both patient and his wife understand to have cbc rechecked early next week, Disposition Plan: . Home with home health , likely 12/3   Consultants:  UROLOGY. `  Procedures:  CBI  prbc x4units on 11/27, then 1unit on 11/29 , 2 units on 12/2, one units on 12/3 12/2: 1. Cystoscopy with clot evacuation 2. Transurethral fulguration of bladder neck  bleeding.  Antibiotics:  Rocephin from 12/1  Diflucan from 12/1  HPI/Subjective: POD#1, urine clear, foley removed, urinate without difficulty hgb dropped to 6.9, requiring transfusion of prbc x1 this am,  denies chest pain, no sob  Objective: Filed Vitals:   01/03/15 1115 01/03/15 1317  BP: 127/54 119/47  Pulse: 79 74  Temp: 98.9 F (37.2 C)   Resp: 20 16    Intake/Output Summary (Last 24 hours) at 01/03/15 1815 Last data filed at 01/03/15 1300  Gross per 24 hour  Intake   1280 ml  Output   1000 ml  Net    280 ml   Filed Weights   12/28/14 2005  Weight: 229 lb 4.5 oz (104 kg)    Exam:   General:  Alert afebrile comfortable.   Cardiovascular: s1s2  Respiratory: CTABL  Abdomen: soft NT nd bs+  Musculoskeletal: no pedal edema.   Data Reviewed: Basic Metabolic Panel:  Recent Labs Lab 12/30/14 0503 12/31/14 0423 01/01/15 0441 01/02/15 0531 01/03/15 0507  NA 139 142 140 134* 139  K 4.1 4.3 4.2 4.0 4.2  CL 104 107 106 101 103  CO2 '28 27 26 26 29  '$ GLUCOSE 57* 132* 133* 185* 137*  BUN '15 14 15 18 14  '$ CREATININE 0.84 0.82 0.87 0.85 0.82  CALCIUM 8.6* 8.6* 8.6* 8.4* 8.1*  MG  --   --   --   --  1.6*   Liver Function Tests:  Recent Labs Lab 12/29/14 0446  AST 31  ALT 33  ALKPHOS 47  BILITOT 0.6  PROT 5.9*  ALBUMIN 3.7   No results for input(s): LIPASE, AMYLASE in the last 168 hours. No results for input(s): AMMONIA in the last 168 hours. CBC:  Recent Labs Lab 12/29/14 0446 12/30/14 0503 12/31/14 0423 01/01/15 0441 01/02/15 0531 01/03/15 0507  WBC 10.6* 9.4 9.2 9.5 8.4 7.2  NEUTROABS 7.8* 6.9  --   --   --   --   HGB 7.9* 7.2* 8.3* 8.2* 6.5* 6.9*  HCT 24.0* 22.2* 25.0* 25.0* 19.7* 21.4*  MCV 79.2 81.6 81.7 83.1 82.1 83.9  PLT 236 239 225 261 218 240   Cardiac Enzymes: No results for input(s): CKTOTAL, CKMB, CKMBINDEX, TROPONINI in the last 168 hours. BNP (last 3 results) No results for input(s): BNP in the last 8760  hours.  ProBNP (last 3 results) No results for input(s): PROBNP in the last 8760 hours.  CBG:  Recent Labs Lab 01/02/15 0739 01/02/15 1136 01/02/15 1442 01/02/15 1634 01/02/15 2102  GLUCAP 146* 133* 148* 151* 251*    Recent Results (from the past 240 hour(s))  Surgical pcr screen     Status: None   Collection Time: 01/02/15  9:43 AM  Result Value Ref Range Status   MRSA, PCR NEGATIVE NEGATIVE Final   Staphylococcus aureus NEGATIVE NEGATIVE Final    Comment:        The Xpert SA Assay (FDA approved for NASAL specimens in patients over 6 years of age), is one component of a comprehensive surveillance program.  Test performance has been validated by [facility] for patients greater than or equal to 81 year old. It is not intended to diagnose infection nor to guide or monitor treatment.      Studies: No results found.  Scheduled Meds: . atorvastatin  40 mg Oral q morning - 10a  . cefTRIAXone (ROCEPHIN)  IV  1 g Intravenous Daily  . fluconazole  100 mg Oral Daily  . insulin aspart  0-9 Units Subcutaneous TID WC  . insulin aspart protamine- aspart  20 Units Subcutaneous BID WC  . pantoprazole  40 mg Oral Daily  . ramipril  10 mg Oral q morning - 10a  . sodium chloride  3 mL Intravenous Q12H   Continuous Infusions:   Active Problems:   Acute blood loss anemia   Hematuria   Pneumonia    Time spent: 25 minutes.     Tracia Lacomb MD PhD  [facility] Pager [phone] If 7PM-7AM, please contact night-coverage at [email or web address], password [email or web address] 01/03/2015, 6:15 PM  LOS: 6 days

## 2015-01-03 NOTE — Progress Notes (Signed)
Hgb 6.9. No active bleeding noted. NP on call notified. New order placed.

## 2015-01-04 DIAGNOSIS — E119 Type 2 diabetes mellitus without complications: Secondary | ICD-10-CM

## 2015-01-04 DIAGNOSIS — Z794 Long term (current) use of insulin: Secondary | ICD-10-CM

## 2015-01-04 LAB — CBC
HEMATOCRIT: 26.3 % — AB (ref 39.0–52.0)
Hemoglobin: 8.5 g/dL — ABNORMAL LOW (ref 13.0–17.0)
MCH: 27.2 pg (ref 26.0–34.0)
MCHC: 32.3 g/dL (ref 30.0–36.0)
MCV: 84.3 fL (ref 78.0–100.0)
PLATELETS: 258 10*3/uL (ref 150–400)
RBC: 3.12 MIL/uL — AB (ref 4.22–5.81)
RDW: 15.8 % — ABNORMAL HIGH (ref 11.5–15.5)
WBC: 6.3 10*3/uL (ref 4.0–10.5)

## 2015-01-04 LAB — TYPE AND SCREEN
ABO/RH(D): B POS
ANTIBODY SCREEN: NEGATIVE
Unit division: 0
Unit division: 0

## 2015-01-04 LAB — BASIC METABOLIC PANEL
ANION GAP: 5 (ref 5–15)
BUN: 14 mg/dL (ref 6–20)
CO2: 30 mmol/L (ref 22–32)
Calcium: 8.3 mg/dL — ABNORMAL LOW (ref 8.9–10.3)
Chloride: 106 mmol/L (ref 101–111)
Creatinine, Ser: 0.81 mg/dL (ref 0.61–1.24)
GFR calc Af Amer: >60 mL/min (ref >60)
GLUCOSE: 149 mg/dL — AB (ref 65–99)
POTASSIUM: 4.2 mmol/L (ref 3.5–5.1)
Sodium: 141 mmol/L (ref 135–145)

## 2015-01-04 LAB — MAGNESIUM: Magnesium: 1.9 mg/dL (ref 1.7–2.4)

## 2015-01-04 LAB — GLUCOSE, CAPILLARY
GLUCOSE-CAPILLARY: 188 mg/dL — AB (ref 65–99)
Glucose-Capillary: 142 mg/dL — ABNORMAL HIGH (ref 65–99)

## 2015-01-04 MED ORDER — INSULIN REGULAR HUMAN 100 UNIT/ML IJ SOLN: 20.0000 [IU] | Freq: Two times a day (BID) | INTRAMUSCULAR | Status: DC

## 2015-01-04 MED ORDER — FLUCONAZOLE 100 MG PO TABS: 100.0000 mg | ORAL_TABLET | Freq: Every day | ORAL | Status: DC

## 2015-01-04 MED ORDER — CIPROFLOXACIN HCL 500 MG PO TABS: 500.0000 mg | ORAL_TABLET | Freq: Two times a day (BID) | ORAL | Status: DC

## 2015-01-04 MED ORDER — LIVING WELL WITH DIABETES BOOK
Freq: Once | Status: AC
  Administered 2015-01-04: 1
  Filled 2015-01-04: qty 1

## 2015-01-04 NOTE — Care Management Note (Signed)
Case Management Note  Patient Details  Name: [name] MRN: [id number] Date of Birth: [birth year]  Subjective/Objective:     Acute blood loss anemia, PNA               Action/Plan: NCM spoke to pt and offered choice for [facility]. Pt agreeable to [facility] for [facility]. Has RW at home. Contacted [employer] rep with new referral.   Expected Discharge Date:  01/04/2015               Expected Discharge Plan:  [facility]  In-House Referral:     Discharge planning Services  CM Consult  Post Acute Care Choice:  Home Health Choice offered to:  Adult Children   HH Arranged:  PT HH Agency:  [facility]  Status of Service:  Completed, signed off  Medicare Important Message Given:  Yes Date Medicare IM Given:    Medicare IM give by:    Date Additional Medicare IM Given:    Additional Medicare Important Message give by:     If discussed at [employer] Meetings, dates discussed:    Additional Comments:  Erenest Rasher, RN 01/04/2015, 2:28 PM

## 2015-01-04 NOTE — Progress Notes (Signed)
2 Days Post-Op Subjective: Patient reports that he is voiding. Urine  Objective: Vital signs in last 24 hours: Temp:  [98.4 F (36.9 C)-98.9 F (37.2 C)] 98.7 F (37.1 C) (12/04 7939) Pulse Rate:  [74-90] 74 (12/04 0608) Resp:  [16-20] 20 (12/04 0300) BP: (119-157)/(47-94) 145/73 mmHg (12/04 0608) SpO2:  [96 %-99 %] 96 % (12/04 9233)  Intake/Output from previous day: 12/03 0701 - 12/04 0700 In: Hannah [P.O.:1440; Blood:390; IV Piggyback:50] Out: -  Intake/Output this shift:    Physical Exam:     Lab Results:  Recent Labs  01/02/15 0531 01/03/15 0507 01/04/15 0521  HGB 6.5* 6.9* 8.5*  HCT 19.7* 21.4* 26.3*   BMET  Recent Labs  01/03/15 0507 01/04/15 0521  NA 139 141  K 4.2 4.2  CL 103 106  CO2 29 30  GLUCOSE 137* 149*  BUN 14 14  CREATININE 0.82 0.81  CALCIUM 8.1* 8.3*   No results for input(s): LABPT, INR in the last 72 hours. No results for input(s): LABURIN in the last 72 hours. Results for orders placed or performed during the hospital encounter of 12/28/14  Surgical pcr screen     Status: None   Collection Time: 01/02/15  9:43 AM  Result Value Ref Range Status   MRSA, PCR NEGATIVE NEGATIVE Final   Staphylococcus aureus NEGATIVE NEGATIVE Final    Comment:        The Xpert SA Assay (FDA approved for NASAL specimens in patients over 55 years of age), is one component of a comprehensive surveillance program.  Test performance has been validated by [facility] for patients greater than or equal to 13 year old. It is not intended to diagnose infection nor to guide or monitor treatment.     Studies/Results: No results found.  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

## 2015-01-04 NOTE — Discharge Summary (Addendum)
Discharge Summary  [name] XFG:[id number] DOB: 12-26-1940  PCP: Jeremiah Hams, MD  Admit date: 12/28/2014 Discharge date: 01/04/2015  Time spent: <34mns  Recommendations for Outpatient Follow-up:  1. F/u with PMD Dr. PDelfina [name] in one week, repeat cbc/bmp at follow up. pmd to continue monitor hgb and blood sugar control 2. F/u with urology Dr. OLoel [name] Discharge Diagnoses:  Active Hospital Problems   Diagnosis Date Noted  . Acute blood loss anemia 12/28/2014  . Hematuria 12/28/2014  . Pneumonia 12/28/2014    Resolved Hospital Problems   Diagnosis Date Noted Date Resolved  No resolved problems to display.    Discharge Condition: stable  Diet recommendation: heart healthy/carb modified  Filed Weights   12/28/14 2005  Weight: 229 lb 4.5 oz (104 kg)    History of present illness:  Patient is a 74yo male with history of prostate cancer Gleason 4+3 s/p radiation in May 2015.  - He has been doing well until 5 weeks ago when he started having hematuria. He went to see his urologist. A cystoscopy was done and a foley catheter was inserted.  - But hematuria recurred a few days later and got worse over the last 2 weeks. He went back to his urologist 10 days ago and had the catheter replaced. The hematuria cleared up initially but recurred again with leaking of blood around it.  - He had symptoms of dysuria and frequency as well. No purulent discharge seen. No abdominal pain or GI symptoms N/V/D/C.  - He started hyperbaric oxygen therapy last week.  - He is here today for passing clots in his urine, bleeding around catheter and clogged catheter.  - He denies chest pain or dyspnea. No other complaints.  - He was seen by urology, irrigation is going with no more clots coming out in the catheter as I can tell and clearing up of his urine.   Hospital Course:  Active Problems:   Acute blood loss anemia   Hematuria   Pneumonia  1. Gross hematuria;  probably from  radiation cystitis.  Persistent hematuria, started rocephin empirically, also treat yeast in urine with diflucan, Hematuria resolved after fulguration of bladder neck bleeder in OR on 12/2, appreciate urology input. Urology cleared patient to be discharged with Outpatient urology follow up. He is discharged with cipro and diflucan for two more days to finish treatment course.  2. Anemia: from anemia of blood loss.  transfuse prn to keep hgb >8. received total of 8units of blood in the hospital, hgb stable at 8.5 at discharge  3. Cough: cxr no acute findings,continue incentive spirometer.   4. Prostate cancer: Further management as per oncology.   5. Diabetes Mellitus: CBG (last 3)    Recent Labs (last 2 labs)      Recent Labs  01/02/15 1442 01/02/15 1634 01/02/15 2102  GLUCAP 148* 151* 251*      hgba1c is 6.5. Resume SSI.   Decrease the dose of insulin to half his home dose, pmd to continue monitor blood sugar control and continue adjust insulin dose.  Metformin held in the hospital and resumed at discharge.   6. Hypertension:  Stable On altace,   7. Hypomagnesemia:  mag replaced   Code Status: DNR Family Communication: patient , wife over the phone ,discussed with her about  The discharge  Disposition Plan: . Home with home health  12/4   Consultants:  UROLOGY. `  Procedures:  CBI  prbc x4units on 11/27, then 1unit on  11/29 , 2 units on 12/2, one units on 12/3 12/2: 1. Cystoscopy with clot evacuation 2. Transurethral fulguration of bladder neck bleeding.  Antibiotics:  Rocephin from 12/1  Diflucan from 12/1      Discharge Exam: BP 145/73 mmHg  Pulse 74  Temp(Src) 98.7 F (37.1 C) (Oral)  Resp 20  Ht 6' (1.829 m)  Wt 229 lb 4.5 oz (104 kg)  BMI 31.09 kg/m2  SpO2 96%    General: Alert afebrile comfortable.   Cardiovascular: s1s2  Respiratory: CTABL  Abdomen: soft NT nd bs+  Musculoskeletal: no pedal  edema.   Discharge Instructions You were cared for by a hospitalist during your hospital stay. If you have any questions about your discharge medications or the care you received while you were in the hospital after you are discharged, you can call the unit and asked to speak with the hospitalist on call if the hospitalist that took care of you is not available. Once you are discharged, your primary care physician will handle any further medical issues. Please note that NO REFILLS for any discharge medications will be authorized once you are discharged, as it is imperative that you return to your primary care physician (or establish a relationship with a primary care physician if you do not have one) for your aftercare needs so that they can reassess your need for medications and monitor your lab values.  Discharge Instructions    Diet - low sodium heart healthy    Complete by:  As directed   Carb modified     Face-to-face encounter (required for Medicare/Medicaid patients)    Complete by:  As directed   I [name] certify that this patient is under my care and that I, or a nurse practitioner or physician's assistant working with me, had a face-to-face encounter that meets the physician face-to-face encounter requirements with this patient on 01/04/2015. The encounter with the patient was in whole, or in part for the following medical condition(s) which is the primary reason for home health care (List medical condition): FTT  The encounter with the patient was in whole, or in part, for the following medical condition, which is the primary reason for home health care:  FTT  I certify that, based on my findings, the following services are medically necessary home health services:  Physical therapy  Reason for Medically Necessary Home Health Services:  Skilled Nursing- Change/Decline in Patient Status  My clinical findings support the need for the above services:  Unsafe ambulation due to balance issues   Further, I certify that my clinical findings support that this patient is homebound due to:  Unsafe ambulation due to balance issues     Home Health    Complete by:  As directed   To provide the following care/treatments:  PT     Increase activity slowly    Complete by:  As directed             Medication List    STOP taking these medications        HYDROcodone-acetaminophen 10-325 MG tablet  Commonly known as:  NORCO     MOVIPREP 100 G Solr  Generic drug:  peg 3350 powder      TAKE these medications        aspirin 81 MG tablet  Take 81 mg by mouth daily.     atorvastatin 40 MG tablet  Commonly known as:  LIPITOR  Take 40 mg by mouth every morning.  ciprofloxacin 500 MG tablet  Commonly known as:  CIPRO  Take 1 tablet (500 mg total) by mouth 2 (two) times daily.     cyanocobalamin 100 MCG tablet  Take 100 mcg by mouth daily.     fexofenadine 180 MG tablet  Commonly known as:  ALLEGRA  Take 180 mg by mouth as needed for allergies or rhinitis.     FLONASE NA  Place 1 spray into the nose as needed (allergies).     fluconazole 100 MG tablet  Commonly known as:  DIFLUCAN  Take 1 tablet (100 mg total) by mouth daily.     guaiFENesin 600 MG 12 hr tablet  Commonly known as:  MUCINEX  Take 1,200 mg by mouth 2 (two) times daily as needed for cough or to loosen phlegm.     insulin regular 100 units/mL injection  Commonly known as:  NOVOLIN R,HUMULIN R  Inject 0.2 mLs (20 Units total) into the skin 2 (two) times daily before a meal.     metFORMIN 1000 MG tablet  Commonly known as:  GLUCOPHAGE  Take 1,000 mg by mouth 2 (two) times daily with a meal.     Omega 3 1000 MG Caps  Take 1 capsule by mouth daily.     omeprazole 20 MG capsule  Commonly known as:  PRILOSEC  Take 20 mg by mouth daily as needed (heartburn).     ramipril 10 MG capsule  Commonly known as:  ALTACE  Take 10 mg by mouth every morning.     Vitamin D3 1000 UNITS Caps  Take 1 capsule by  mouth daily.       No Known Allergies     Follow-up Information    Follow up with Claybon Jabs, MD.   Specialty:  Urology   Why:  If symptoms worsen   Contact information:   [place] [address] [phone]       Follow up with Jeremiah Hams, MD In 1 week.   Specialty:  Internal Medicine   Why:  hospital discharge follow up, repeat cbc on monday.    Contact information:   [address] [place] [address] [phone]        The results of significant diagnostics from this hospitalization (including imaging, microbiology, ancillary and laboratory) are listed below for reference.    Significant Diagnostic Studies: Dg Chest 2 View  12/12/2014  CLINICAL DATA:  Prostate cancer. History smoking. Hypertension. Diabetes. EXAM: CHEST  2 VIEW COMPARISON:  05/31/2013 . FINDINGS: Mediastinum and hilar structures are normal. Mild basal pleural-parenchymal thickening noted consistent with scarring. No acute infiltrate. No pleural effusion or pneumothorax P Heart size normal. No acute bony abnormality . IMPRESSION: No acute cardiopulmonary disease. Basal pleural parenchymal scarring. Electronically Signed   By: Marcello Moores  Register   On: 12/12/2014 13:24   Dg Chest Port 1 View  12/31/2014  CLINICAL DATA:  Productive cough starting Sunday EXAM: PORTABLE CHEST 1 VIEW COMPARISON:  12/12/2014 FINDINGS: Cardiomediastinal silhouette is stable. Atherosclerotic calcifications of thoracic aorta again noted. No acute infiltrate or pleural effusion. No pulmonary edema. Degenerative changes bilateral shoulders. IMPRESSION: No active disease. Electronically Signed   By: Lahoma Crocker M.D.   On: 12/31/2014 12:50    Microbiology: Recent Results (from the past 240 hour(s))  Surgical pcr screen     Status: None   Collection Time: 01/02/15  9:43 AM  Result Value Ref Range Status   MRSA, PCR NEGATIVE NEGATIVE Final  Staphylococcus aureus NEGATIVE NEGATIVE Final     Comment:        The Xpert SA Assay (FDA approved for NASAL specimens in patients over 40 years of age), is one component of a comprehensive surveillance program.  Test performance has been validated by [facility] for patients greater than or equal to 36 year old. It is not intended to diagnose infection nor to guide or monitor treatment.      Labs: Basic Metabolic Panel:  Recent Labs Lab 12/31/14 0423 01/01/15 0441 01/02/15 0531 01/03/15 0507 01/04/15 0521  NA 142 140 134* 139 141  K 4.3 4.2 4.0 4.2 4.2  CL 107 106 101 103 106  CO2 '27 26 26 29 30  '$ GLUCOSE 132* 133* 185* 137* 149*  BUN '14 15 18 14 14  '$ CREATININE 0.82 0.87 0.85 0.82 0.81  CALCIUM 8.6* 8.6* 8.4* 8.1* 8.3*  MG  --   --   --  1.6* 1.9   Liver Function Tests:  Recent Labs Lab 12/29/14 0446  AST 31  ALT 33  ALKPHOS 47  BILITOT 0.6  PROT 5.9*  ALBUMIN 3.7   No results for input(s): LIPASE, AMYLASE in the last 168 hours. No results for input(s): AMMONIA in the last 168 hours. CBC:  Recent Labs Lab 12/29/14 0446 12/30/14 0503 12/31/14 0423 01/01/15 0441 01/02/15 0531 01/03/15 0507 01/04/15 0521  WBC 10.6* 9.4 9.2 9.5 8.4 7.2 6.3  NEUTROABS 7.8* 6.9  --   --   --   --   --   HGB 7.9* 7.2* 8.3* 8.2* 6.5* 6.9* 8.5*  HCT 24.0* 22.2* 25.0* 25.0* 19.7* 21.4* 26.3*  MCV 79.2 81.6 81.7 83.1 82.1 83.9 84.3  PLT 236 239 225 261 218 240 258   Cardiac Enzymes: No results for input(s): CKTOTAL, CKMB, CKMBINDEX, TROPONINI in the last 168 hours. BNP: BNP (last 3 results) No results for input(s): BNP in the last 8760 hours.  ProBNP (last 3 results) No results for input(s): PROBNP in the last 8760 hours.  CBG:  Recent Labs Lab 01/03/15 0726 01/03/15 1156 01/03/15 1718 01/03/15 2216 01/04/15 0807  GLUCAP 139* 152* 172* 147* 188*       Signed:  Kamiyah Kindel MD, PhD  [facility] 01/04/2015, 11:01 AM

## 2015-01-04 NOTE — Care Management Important Message (Signed)
Important Message  Patient Details  Name: EMMANUELLE COXE MRN: [id number] Date of Birth: [birth year]   Medicare Important Message Given:  Yes    Erenest Rasher, RN 01/04/2015, 2:04 PM

## 2015-01-27 ENCOUNTER — Encounter: Payer: Self-pay | Admitting: General Surgery

## 2015-01-27 ENCOUNTER — Encounter: Payer: Medicare Other | Attending: General Surgery | Admitting: General Surgery

## 2015-01-27 DIAGNOSIS — Z794 Long term (current) use of insulin: Secondary | ICD-10-CM | POA: Diagnosis not present

## 2015-01-27 DIAGNOSIS — I10 Essential (primary) hypertension: Secondary | ICD-10-CM | POA: Insufficient documentation

## 2015-01-27 DIAGNOSIS — R310 Gross hematuria: Secondary | ICD-10-CM | POA: Diagnosis not present

## 2015-01-27 DIAGNOSIS — N3041 Irradiation cystitis with hematuria: Secondary | ICD-10-CM | POA: Insufficient documentation

## 2015-01-27 DIAGNOSIS — E119 Type 2 diabetes mellitus without complications: Secondary | ICD-10-CM | POA: Diagnosis not present

## 2015-01-27 DIAGNOSIS — L599 Disorder of the skin and subcutaneous tissue related to radiation, unspecified: Secondary | ICD-10-CM

## 2015-01-27 DIAGNOSIS — M109 Gout, unspecified: Secondary | ICD-10-CM | POA: Diagnosis not present

## 2015-01-27 DIAGNOSIS — L598 Other specified disorders of the skin and subcutaneous tissue related to radiation: Secondary | ICD-10-CM

## 2015-01-27 DIAGNOSIS — Z8546 Personal history of malignant neoplasm of prostate: Secondary | ICD-10-CM | POA: Insufficient documentation

## 2015-01-27 DIAGNOSIS — Z87891 Personal history of nicotine dependence: Secondary | ICD-10-CM | POA: Insufficient documentation

## 2015-01-27 NOTE — Progress Notes (Addendum)
[name], [name] ([id number]) Visit Report for 01/27/2015 HBO Risk Assessment Details [name], [name] 01/27/2015 9:30 Patient Name: Date of Service: H. AM Medical Record Patient Account Number: [id number] Number: Treating RN: Baruch Gouty, RN, BSN, Velva Harman Date of Birth/Sex: February 10, 1940 (74 y.o. Male) Other Clinician: Primary Care Physician: POLITE, RONALD Treating PARKER, [place] Referring Physician: POLITE, RONALD Physician/Extender: Weeks in Treatment: 0 HBO Risk Assessment Items Answer Barotrauma Risks: Upper Respiratory Infections No Prior Radiation Treatment to Head/Neck No Tracheostomy No Ear problems or surgery (otosclerosis)- Consider pressure equalization tubes No Sinus Problems, Sinus Obstruction No Pulmonary Risks: Currently seeing a pulmonologisto No Emphysema No Pneumothorax No Tuberculosis No Other lung problems (COPD with CO2 retention, lesions, surgery) -Refer to CPGs No Congestive heart Failure -Consider holding HBO if ejection fraction<30% No History of smoking Yes Bullous Disease, Blebs No Other pulmonary abnormalities No Cardiac Risks: Currently seeing a cardiologisto No Pacemaker/AICD No Hypertension Yes History of prior or current malignancy (Cancer) Surgery No Radiation therapy Yes If Yes for Radiation Therapy, number of treatments received: 3 Chemotherapy No Ophthalmic Risks: [name], [name] ([id number]) Optic Neuritis No Cataracts No Myopia No Retinopathy or Retinal Detachment Surgery- Consider pressure equalization tubes No Confinement Anxiety Claustrophobia No Dialysis Dialysis No Any implants; medical or non-medical No Pregnancy No Diabetes HgbA1C within 3 months Yes Currently using these medications: Aspirin Yes Digoxin (CHF patient) No Narcotics No Nitroprusside No Phenothiazine (Thorazine,etc.) No Prednisone or other steroids No Disulfiram (Antabuse) No Mafenide Acetate (Sulfamylon-burn cream) No Amiodarone  No Electronic Signature(s) Signed: 01/27/2015 10:22:28 AM By: Regan Lemming BSN, RN Previous Signature: 01/27/2015 9:50:39 AM Version By: Regan Lemming BSN, RN Entered By: Regan Lemming on 01/27/2015 10:22:26

## 2015-01-28 NOTE — Progress Notes (Signed)
[name], [name] ([id number]) Visit Report for 01/27/2015 Allergy List Details Patient Name: [name]. Date of Service: 01/27/2015 9:30 AM Medical Record Number: [id number] Patient Account Number: [id number] Date of Birth/Sex: 1940-04-25 (74 y.o. Male) Treating RN: Baruch Gouty, RN, BSN, Velva Harman Primary Care Physician: POLITE, RONALD Other Clinician: Referring Physician: POLITE, RONALD Treating Physician/Extender: Judene Companion Weeks in Treatment: 0 Allergies Active Allergies no known allergies Allergy Notes Electronic Signature(s) Signed: 01/27/2015 5:06:20 PM By: Regan Lemming BSN, RN Entered By: Regan Lemming on 01/27/2015 09:31:27 [name] ([id number]) -------------------------------------------------------------------------------- Arrival Information Details Patient Name: [name] Date of Service: 01/27/2015 9:30 AM Medical Record Number: [id number] Patient Account Number: [id number] Date of Birth/Sex: 1940/12/19 (74 y.o. Male) Treating RN: Baruch Gouty, RN, BSN, Velva Harman Primary Care Physician: POLITE, RONALD Other Clinician: Referring Physician: POLITE, RONALD Treating Physician/Extender: Benjaman Pott in Treatment: 0 Visit Information Patient Arrived: Ambulatory Arrival Time: 09:29 Accompanied By: dtr, son inlaw Transfer Assistance: None Patient Identification Verified: Yes Secondary Verification Process Yes Completed: Patient Requires Transmission-Based No Precautions: Patient Has Alerts: No Electronic Signature(s) Signed: 01/27/2015 9:50:05 AM By: Regan Lemming BSN, RN Entered By: Regan Lemming on 01/27/2015 09:50:05 [name] ([id number]) -------------------------------------------------------------------------------- Clinic Level of Care Assessment Details Patient Name: [name] Date of Service: 01/27/2015 9:30 AM Medical Record Number: [id number] Patient Account Number: [id number] Date of Birth/Sex: [birth year] (74 y.o.  Male) Treating RN: Baruch Gouty, RN, BSN, Velva Harman Primary Care Physician: POLITE, RONALD Other Clinician: Referring Physician: POLITE, RONALD Treating Physician/Extender: Benjaman Pott in Treatment: 0 Clinic Level of Care Assessment Items TOOL 2 Quantity Score '[]'$  - Use when only an EandM is performed on the INITIAL visit 0 ASSESSMENTS - Nursing Assessment / Reassessment X - General Physical Exam (combine w/ comprehensive assessment (listed just 1 20 below) when performed on new pt. evals) X - Comprehensive Assessment (HX, ROS, Risk Assessments, Wounds Hx, etc.) 1 25 ASSESSMENTS - Wound and Skin Assessment / Reassessment '[]'$  - Simple Wound Assessment / Reassessment - one wound 0 '[]'$  - Complex Wound Assessment / Reassessment - multiple wounds 0 '[]'$  - Dermatologic / Skin Assessment (not related to wound area) 0 ASSESSMENTS - Ostomy and/or Continence Assessment and Care '[]'$  - Incontinence Assessment and Management 0 '[]'$  - Ostomy Care Assessment and Management (repouching, etc.) 0 PROCESS - Coordination of Care '[]'$  - Simple Patient / Family Education for ongoing care 0 '[]'$  - Complex (extensive) Patient / Family Education for ongoing care 0 '[]'$  - Staff obtains Programmer, systems, Records, Test Results / Process Orders 0 '[]'$  - Staff telephones HHA, Nursing Homes / Clarify orders / etc 0 '[]'$  - Routine Transfer to another Facility (non-emergent condition) 0 '[]'$  - Routine Hospital Admission (non-emergent condition) 0 X - New Admissions / Biomedical engineer / Ordering NPWT, Apligraf, etc. 1 15 '[]'$  - Emergency Hospital Admission (emergent condition) 0 '[]'$  - Simple Discharge Coordination 0 [name], [name]. ([id number]) '[]'$  - Complex (extensive) Discharge Coordination 0 PROCESS - Special Needs '[]'$  - Pediatric / Minor Patient Management 0 '[]'$  - Isolation Patient Management 0 '[]'$  - Hearing / Language / Visual special needs 0 '[]'$  - Assessment of Community assistance (transportation, D/C planning, etc.) 0 '[]'$  -  Additional assistance / Altered mentation 0 '[]'$  - Support Surface(s) Assessment (bed, cushion, seat, etc.) 0 INTERVENTIONS - Wound Cleansing / Measurement '[]'$  - Wound Imaging (photographs - any number of wounds) 0 '[]'$  - Wound Tracing (instead of photographs) 0 '[]'$  - Simple Wound Measurement - one wound 0 '[]'$  - Complex Wound Measurement -  multiple wounds 0 '[]'$  - Simple Wound Cleansing - one wound 0 '[]'$  - Complex Wound Cleansing - multiple wounds 0 INTERVENTIONS - Wound Dressings '[]'$  - Small Wound Dressing one or multiple wounds 0 '[]'$  - Medium Wound Dressing one or multiple wounds 0 '[]'$  - Large Wound Dressing one or multiple wounds 0 '[]'$  - Application of Medications - injection 0 INTERVENTIONS - Miscellaneous '[]'$  - External ear exam 0 '[]'$  - Specimen Collection (cultures, biopsies, blood, body fluids, etc.) 0 '[]'$  - Specimen(s) / Culture(s) sent or taken to Lab for analysis 0 '[]'$  - Patient Transfer (multiple staff / Harrel Lemon Lift / Similar devices) 0 '[]'$  - Simple Staple / Suture removal (25 or less) 0 '[]'$  - Complex Staple / Suture removal (26 or more) 0 [name], [name] ([id number]) '[]'$  - Hypo / Hyperglycemic Management (close monitor of Blood Glucose) 0 '[]'$  - Ankle / Brachial Index (ABI) - do not check if billed separately 0 Has the patient been seen at the hospital within the last three years: Yes Total Score: 60 Level Of Care: New/Established - Level 2 Electronic Signature(s) Signed: 01/27/2015 5:06:20 PM By: Regan Lemming BSN, RN Entered By: Regan Lemming on 01/27/2015 10:12:43 [name] ([id number]) -------------------------------------------------------------------------------- Encounter Discharge Information Details Patient Name: [name] Date of Service: 01/27/2015 9:30 AM Medical Record Number: [id number] Patient Account Number: [id number] Date of Birth/Sex: Jun 19, 1940 (74 y.o. Male) Treating RN: Baruch Gouty, RN, BSN, Velva Harman Primary Care Physician: POLITE, RONALD Other  Clinician: Referring Physician: POLITE, RONALD Treating Physician/Extender: Benjaman Pott in Treatment: 0 Encounter Discharge Information Items Discharge Pain Level: 0 Discharge Condition: Stable Ambulatory Status: Ambulatory Discharge Destination: Home Private Transportation: Auto Accompanied By: dtr, dtrinlaw Schedule Follow-up Appointment: No Medication Reconciliation completed and No provided to Patient/Care Berle Fitz: Clinical Summary of Care: Electronic Signature(s) Signed: 01/27/2015 5:06:20 PM By: Regan Lemming BSN, RN Entered By: Regan Lemming on 01/27/2015 10:13:22 [name] ([id number]) -------------------------------------------------------------------------------- Multi Wound Chart Details Patient Name: [name] Date of Service: 01/27/2015 9:30 AM Medical Record Number: [id number] Patient Account Number: [id number] Date of Birth/Sex: 12-01-1940 (74 y.o. Male) Treating RN: Baruch Gouty, RN, BSN, Velva Harman Primary Care Physician: POLITE, RONALD Other Clinician: Referring Physician: POLITE, RONALD Treating Physician/Extender: Benjaman Pott in Treatment: 0 Vital Signs Height(in): 70 Pulse(bpm): 82 Weight(lbs): 243 Blood Pressure 156/71 (mmHg): Body Mass Index(BMI): 35 Temperature(F): 98.0 Respiratory Rate 18 (breaths/min): Wound Assessments Treatment Notes Electronic Signature(s) Signed: 01/27/2015 5:06:20 PM By: Regan Lemming BSN, RN Entered By: Regan Lemming on 01/27/2015 10:11:19 [name], [name] ([id number]) -------------------------------------------------------------------------------- Multi-Disciplinary Care Plan Details Patient Name: [name], [name] Date of Service: 01/27/2015 9:30 AM Medical Record Number: [id number] Patient Account Number: [id number] Date of Birth/Sex: [birth year] (74 y.o. 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 procedures Discomfort related to temperature and humidity changes inside hyperbaric chamber Potential for barotraumas to ears, sinuses, teeth, and lungs or cerebral gas embolism related to changes in atmospheric pressure inside hyperbaric oxygen chamber Potential for oxygen toxicity seizures related to delivery of 100% oxygen at an increased atmospheric pressure Potential for pulmonary oxygen toxicity related to delivery of 100% oxygen at an increased atmospheric pressure Goals: Barotrauma will be prevented during HBO2 Date Initiated: 01/27/2015 Goal Status: Active Patient and/or family will  be able to state/discuss factors appropriate to the management of their disease process during treatment Date Initiated: 01/27/2015 Goal Status: Active Patient will tolerate the hyperbaric oxygen therapy treatment Date Initiated: 01/27/2015 Goal Status: Active Patient will tolerate the internal climate of the chamber Date Initiated: 01/27/2015 Goal Status: Active Patient/caregiver will verbalize understanding of HBO goals, rationale, procedures and potential hazards Date Initiated: 01/27/2015 Goal Status: Active Signs and symptoms of pulmonary oxygen toxicity will be recognized and promptly addressed Date Initiated: 01/27/2015 Goal Status: Active Signs and symptoms of seizure will be recognized and promptly addressed ; seizing patients will suffer no harm Date Initiated: 01/27/2015 [name], [name] ([id number]) Goal Status: Active Interventions: Administer a five (5) minute air break for patient if signs and symptoms of seizure appear and notify the hyperbaric physician Administer a ten (10) minute air break  for patient if signs and symptoms of seizure appear and notify the hyperbaric physician Administer decongestants, per physician orders, prior to HBO2 Administer the correct therapeutic gas delivery based on the patients needs and limitations, per physician order Assess and provide for patientos comfort related to the hyperbaric environment and equalization of middle ear Assess for signs and symptoms related to adverse events, including but not limited to confinement anxiety, pneumothorax, oxygen toxicity and baurotrauma Assess patient for any history of confinement anxiety Assess patient's knowledge and expectations regarding hyperbaric medicine and provide education related to the hyperbaric environment, goals of treatment and prevention of adverse events Implement protocols to decrease risk of pneumothorax in high risk patients Notes: Orientation to the Wound Care Program Nursing Diagnoses: Knowledge deficit related to the wound healing center program Goals: Patient/caregiver will verbalize understanding of the [place] Program Date Initiated: 01/27/2015 Goal Status: Active Interventions: Provide education on orientation to the wound center Notes: Electronic Signature(s) Signed: 01/27/2015 5:06:20 PM By: Regan Lemming BSN, RN Entered By: Regan Lemming on 01/27/2015 10:03:05 [name] ([id number]) -------------------------------------------------------------------------------- Pain Assessment Details Patient Name: [name] Date of Service: 01/27/2015 9:30 AM Medical Record Number: [id number] Patient Account Number: [id number] Date of Birth/Sex: 26-Jan-1941 (74 y.o. Male) Treating RN: Baruch Gouty, RN, BSN, Velva Harman Primary Care Physician: POLITE, RONALD Other Clinician: Referring Physician: POLITE, RONALD Treating Physician/Extender: Benjaman Pott in Treatment: 0 Active Problems Location of Pain Severity and Description of Pain Patient Has Paino  No Site Locations Pain Management and Medication Current Pain Management: Electronic Signature(s) Signed: 01/27/2015 5:06:20 PM By: Regan Lemming BSN, RN Entered By: Regan Lemming on 01/27/2015 09:30:59 [name] ([id number]) -------------------------------------------------------------------------------- Patient/Caregiver Education Details Patient Name: [name] Date of Service: 01/27/2015 9:30 AM Medical Record Number: [id number] Patient Account Number: [id number] Date of Birth/Gender: 05/30/1940 (74 y.o. Male) Treating RN: Baruch Gouty, RN, BSN, Velva Harman Primary Care Physician: POLITE, RONALD Other Clinician: Referring Physician: POLITE, RONALD Treating Physician/Extender: Benjaman Pott in Treatment: 0 Education Assessment Education Provided To: Patient Education Topics Provided Welcome To [facility]: Methods: Explain/Verbal Responses: State content correctly Electronic Signature(s) Signed: 01/27/2015 5:06:20 PM By: Regan Lemming BSN, RN Entered By: Regan Lemming on 01/27/2015 10:13:32 [name] ([id number]) -------------------------------------------------------------------------------- Vitals Details Patient Name: [name] Date of Service: 01/27/2015 9:30 AM Medical Record Number: [id number] Patient Account Number: [id number] Date of Birth/Sex: Mar 13, 1940 (74 y.o. Male) Treating RN: Baruch Gouty, RN, BSN, Velva Harman Primary Care Physician: POLITE, RONALD Other Clinician: Referring Physician: POLITE, RONALD Treating Physician/Extender: Benjaman Pott in Treatment: 0 Vital Signs Time Taken: 09:43 Temperature (F): 98.0 Height (in): 70 Pulse (bpm): 82 Source: Stated Respiratory  Rate (breaths/min): 18 Weight (lbs): 243 Blood Pressure (mmHg): 156/71 Source: Stated Reference Range: 80 - 120 mg / dl Body Mass Index (BMI): 34.9 Electronic Signature(s) Signed: 01/27/2015 5:06:20 PM By: Regan Lemming BSN, RN Entered By: Regan Lemming on  01/27/2015 09:45:40

## 2015-01-28 NOTE — Progress Notes (Signed)
[name], [name] ([id number]) Visit Report for 01/27/2015 Abuse/Suicide Risk Screen Details [name], [name] 01/27/2015 9:30 Patient Name: Date of Service: H. AM Medical Record Patient Account Number: [id number] Number: Treating RN: Baruch Gouty, RN, BSN, Velva Harman Date of Birth/Sex: May 19, 1940 (74 y.o. Male) Other Clinician: Primary Care Physician: POLITE, RONALD Treating PARKER, PETER Referring Physician: POLITE, RONALD Physician/Extender: Weeks in Treatment: 0 Abuse/Suicide Risk Screen Items Answer ABUSE/SUICIDE RISK SCREEN: Has anyone close to you tried to hurt or harm you recentlyo No Do you feel uncomfortable with anyone in your familyo No Has anyone forced you do things that you didnot want to doo No Do you have any thoughts of harming yourselfo No Patient displays signs or symptoms of abuse and/or neglect. No Electronic Signature(s) Signed: 01/27/2015 5:06:20 PM By: Regan Lemming BSN, RN Entered By: Regan Lemming on 01/27/2015 09:36:13 [name], [name] ([id number]) -------------------------------------------------------------------------------- Activities of Daily Living Details [name], [name] 01/27/2015 9:30 Patient Name: Date of Service: H. AM Medical Record Patient Account Number: [id number] Number: Treating RN: Baruch Gouty, RN, BSN, Velva Harman Date of Birth/Sex: [birth year] (74 y.o. Male) Other Clinician: Primary Care Physician: POLITE, RONALD Treating PARKER, [place] Referring Physician: POLITE, RONALD Physician/Extender: Weeks in Treatment: 0 Activities of Daily Living Items Answer Activities of Daily Living (Please select one for each item) Drive Automobile Completely Able Take Medications Completely Able Use Telephone Completely [place] for Appearance Completely Able Use Toilet Completely Able Bath / Shower Completely Able Dress Self Completely Able Feed Self Completely Able Walk Completely Able Get In / Out Bed Completely [place] for Self Completely Able Electronic Signature(s) Signed: 01/27/2015 5:06:20 PM By: Regan Lemming BSN, RN Entered By: Regan Lemming on 01/27/2015 09:36:35 [name], [name] ([id number]) -------------------------------------------------------------------------------- Education Assessment Details [name], [name] 01/27/2015 9:30 Patient Name: Date of Service: Lemmie Evens AM Medical Record Patient Account Number: [id number] Number: Treating RN: Baruch Gouty, RN, BSN, Velva Harman Date of Birth/Sex: February 15, 1940 (74 y.o. Male) Other Clinician: Primary Care Physician: POLITE, RONALD Treating PARKER, PETER Referring Physician: POLITE, RONALD Physician/Extender: Weeks in Treatment: 0 Primary Learner Assessed: Patient Learning Preferences/Education Level/Primary Language Learning Preference: Explanation Highest Education Level: High School Preferred Language: English Cognitive Barrier Assessment/Beliefs Language Barrier: No Physical Barrier Assessment Impaired Vision: No Impaired Hearing: No Decreased Hand dexterity: No Knowledge/Comprehension Assessment Knowledge Level: High Comprehension Level: High Ability to understand written High instructions: Ability to understand verbal High instructions: Motivation Assessment Anxiety Level: Calm Cooperation: Cooperative Education Importance: Acknowledges Need Interest in Health Problems: Asks Questions Perception: Coherent Willingness to Engage in Self- High Management Activities: Readiness to Engage in Self- High Management Activities: Electronic Signature(s) Signed: 01/27/2015 5:06:20 PM By: Regan Lemming BSN, RN Entered By: Regan Lemming on 01/27/2015 09:37:00 [name], [name] ([id number]) [name], [name] ([id number]) -------------------------------------------------------------------------------- Fall Risk Assessment Details [name], [name] 01/27/2015  9:30 Patient Name: Date of Service: Lemmie Evens AM Medical Record Patient Account Number: [id number] Number: Treating RN: Baruch Gouty, RN, BSN, Velva Harman Date of Birth/Sex: 1940-11-08 (74 y.o. Male) Other Clinician: Primary Care Physician: POLITE, RONALD Treating PARKER, PETER Referring Physician: POLITE, RONALD Physician/Extender: Weeks in Treatment: 0 Fall Risk Assessment Items Have you had 2 or more falls in the last 12 monthso 0 No Have you had any fall that resulted in injury in the last 12 monthso 0 No FALL RISK ASSESSMENT: History of falling - immediate or within 3 months 0 No Secondary diagnosis 0 No Ambulatory aid None/bed rest/wheelchair/nurse 0 Yes Crutches/cane/walker 0 No Furniture 0 No IV  Access/Saline Lock 0 No Gait/Training Normal/bed rest/immobile 0 Yes Weak 0 No Impaired 0 No Mental Status Oriented to own ability 0 Yes Electronic Signature(s) Signed: 01/27/2015 5:06:20 PM By: Regan Lemming BSN, RN Entered By: Regan Lemming on 01/27/2015 09:37:14 [name], [name] ([id number]) -------------------------------------------------------------------------------- Foot Assessment Details [name], [name] 01/27/2015 9:30 Patient Name: Date of Service: Lemmie Evens AM Medical Record Patient Account Number: [id number] Number: Treating RN: Baruch Gouty, RN, BSN, Velva Harman Date of Birth/Sex: 1940-04-02 (74 y.o. Male) Other Clinician: Primary Care Physician: POLITE, [place], [place] Referring Physician: POLITE, RONALD Physician/Extender: Weeks in Treatment: 0 Foot Assessment Items Site Locations + = Sensation present, - = Sensation absent, C = Callus, U = Ulcer R = Redness, W = Warmth, M = Maceration, PU = Pre-ulcerative lesion F = Fissure, S = Swelling, D = Dryness Assessment Right: Left: Other Deformity: No No Prior Foot Ulcer: No No Prior Amputation: No No Charcot Joint: No No Ambulatory Status: Ambulatory Without Help Gait: Steady Electronic  Signature(s) Signed: 01/27/2015 5:06:20 PM By: Regan Lemming BSN, RN Entered By: Regan Lemming on 01/27/2015 09:37:30 [name], [name] ([id number]) -------------------------------------------------------------------------------- Nutrition Risk Assessment Details PEACE, JOST 01/27/2015 9:30 Patient Name: Date of Service: Lemmie Evens AM Medical Record Patient Account Number: [id number] Number: Treating RN: Baruch Gouty, RN, BSN, Velva Harman Date of Birth/Sex: Jun 30, 1940 (74 y.o. Male) Other Clinician: Primary Care Physician: POLITE, RONALD Treating PARKER, PETER Referring Physician: POLITE, RONALD Physician/Extender: Weeks in Treatment: 0 Height (in): Weight (lbs): Body Mass Index (BMI): Nutrition Risk Assessment Items NUTRITION RISK SCREEN: I have an illness or condition that made me change the kind and/or 0 No amount of food I eat I eat fewer than two meals per day 0 No I eat few fruits and vegetables, or milk products 0 No I have three or more drinks of beer, liquor or wine almost every day 0 No I have tooth or mouth problems that make it hard for me to eat 0 No I don't always have enough money to buy the food I need 0 No I eat alone most of the time 0 No I take three or more different prescribed or over-the-counter drugs a 0 No day Without wanting to, I have lost or gained 10 pounds in the last six 0 No months I am not always physically able to shop, cook and/or feed myself 0 No Nutrition Protocols Good Risk Protocol 0 No interventions needed Moderate Risk Protocol Electronic Signature(s) Signed: 01/27/2015 5:06:20 PM By: Regan Lemming BSN, RN Entered By: Regan Lemming on 01/27/2015 09:37:22

## 2015-01-29 NOTE — Progress Notes (Addendum)
KANYON, SEIBOLD ([id number]) Visit Report for 01/27/2015 Chief Complaint Document Details ZADIN, LANGE 01/27/2015 9:30 Patient Name: Date of Service: H. AM Medical Record Patient Account Number: [id number] Number: Treating RN: Baruch Gouty, RN, BSN, Velva Harman Date of Birth/Sex: 30-Jul-1940 (74 y.o. Male) Other Clinician: Primary Care Physician: POLITE, RONALD Treating Joannah Gitlin, [place] Referring Physician: POLITE, RONALD Physician/Extender: Weeks in Treatment: 0 Information Obtained from: Patient Electronic Signature(s) Signed: 01/27/2015 10:15:18 AM By: Judene Companion MD Entered By: Judene Companion on 01/27/2015 10:15:18 RIAAN, TOLEDO ([id number]) -------------------------------------------------------------------------------- HPI Details SIONE, BAUMGARTEN 01/27/2015 9:30 Patient Name: Date of Service: Lemmie Evens AM Medical Record Patient Account Number: [id number] Number: Treating RN: Baruch Gouty, RN, BSN, Velva Harman Date of Birth/Sex: March 05, 1940 (74 y.o. Male) Other Clinician: Primary Care Physician: POLITE, RONALD Treating [place], [place] Referring Physician: POLITE, RONALD Physician/Extender: Weeks in Treatment: 0 Electronic Signature(s) Signed: 01/27/2015 10:15:24 AM By: Judene Companion MD Entered By: Judene Companion on 01/27/2015 10:15:24 RYELAN, KAZEE ([id number]) -------------------------------------------------------------------------------- Physical Exam Details NYCHOLAS, RAYNER 01/27/2015 9:30 Patient Name: Date of Service: Lemmie Evens AM Medical Record Patient Account Number: [id number] Number: Treating RN: Baruch Gouty RN, BSN, Velva Harman Date of Birth/Sex: [birth year] (74 y.o. Male) Other Clinician: Primary Care Physician: POLITE, RONALD Treating [place], [place] Referring Physician: POLITE, RONALD Physician/Extender: Weeks in Treatment: 0 Electronic Signature(s) Signed: 01/27/2015 10:15:40 AM By: Judene Companion MD Entered By: Judene Companion on 01/27/2015  10:15:40 BARTOW, ZYLSTRA ([id number]) -------------------------------------------------------------------------------- Physician Orders Details IZAIAH, TABB 01/27/2015 9:30 Patient Name: Date of Service: Lemmie Evens AM Medical Record Patient Account Number: [id number] Number: Treating RN: Baruch Gouty RN, BSN, Velva Harman Date of Birth/Sex: 02-03-1940 (74 y.o. Male) Other Clinician: Primary Care Physician: POLITE, RONALD Treating Audris Speaker, Leigh Referring Physician: POLITE, RONALD Physician/Extender: Weeks in Treatment: 0 Verbal / Phone Orders: Yes Clinician: Afful, RN, BSN, Rita Read Back and Verified: Yes Diagnosis Coding Follow-up Appointments o Return Appointment in 1 week. Hyperbaric Oxygen Therapy o Evaluate for HBO Therapy o Indication: - Badder cystitis Electronic Signature(s) Signed: 01/27/2015 5:06:20 PM By: Regan Lemming BSN, RN Signed: 01/29/2015 8:37:21 AM By: Judene Companion MD Entered By: Regan Lemming on 01/27/2015 10:12:07 TRIP, CAVANAGH ([id number]) -------------------------------------------------------------------------------- Problem List Details KAYCEON, OKI 01/27/2015 9:30 Patient Name: Date of Service: Lemmie Evens AM Medical Record Patient Account Number: [id number] Number: Treating RN: Baruch Gouty, RN, BSN, Velva Harman Date of Birth/Sex: 07-28-1940 (74 y.o. 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 AM Medical Record Patient Account Number: [id number] Number: Treating RN: Baruch Gouty, RN, BSN, Velva Harman Date of Birth/Sex: Feb 05, 1940 (74 y.o. Male) Other Clinician: Primary Care Physician: POLITE, RONALD Treating Zaniyah Wernette, Lindy Referring Physician: POLITE, RONALD Physician/Extender: Weeks in Treatment: 0 Subjective Chief Complaint Information obtained from Patient Patient History Information obtained from Patient. Allergies no known allergies Social History Former smoker - quit 6 years ago, Marital Status - Married, Alcohol Use - Never, Drug Use - No History, Caffeine Use - Daily -  coffee. Medical History Ear/Nose/Mouth/Throat Denies history of Chronic sinus problems/congestion, Middle ear problems Hematologic/Lymphatic Patient has history of Anemia Cardiovascular Patient has history of Hypertension Gastrointestinal Denies history of Cirrhosis , Colitis, Crohn s, Hepatitis A, Hepatitis B, Hepatitis C Endocrine Patient has history of Type II Diabetes Immunological Denies history of Lupus Erythematosus, Raynaud s, Scleroderma Musculoskeletal Patient has history of Gout Oncologic Patient has history of Received Radiation - a year ago Denies history of Received Chemotherapy Psychiatric Denies history of Anorexia/bulimia Patient is treated with Insulin, Oral Agents. Blood sugar results noted at the following times: Breakfast - 94. CHRISTPHOR, GROFT ([id number]) Medical And Surgical History Notes Genitourinary prostate cancer Review of Systems (ROS) Eyes The patient has no complaints or  symptoms. Ear/Nose/Mouth/Throat The patient has no complaints or symptoms. Hematologic/Lymphatic The patient has no complaints or symptoms. Respiratory The patient has no complaints or symptoms. Gastrointestinal The patient has no complaints or symptoms. Endocrine The patient has no complaints or symptoms. Immunological The patient has no complaints or symptoms. Integumentary (Skin) The patient has no complaints or symptoms. Musculoskeletal The patient has no complaints or symptoms. Neurologic The patient has no complaints or symptoms. Psychiatric The patient has no complaints or symptoms. Objective Constitutional Vitals Time Taken: 9:43 AM, Height: 70 in, Source: Stated, Weight: 243 lbs, Source: Stated, BMI: 34.9, Temperature: 98.0 F, Pulse: 82 bpm, Respiratory Rate: 18 breaths/min, Blood Pressure: 156/71 mmHg. 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 Continue HBO Electronic Signature(s) Signed: 01/29/2015 9:18:14 AM By: Montey Hora Signed: 01/30/2015 8:01:58 AM By: Judene Companion MD Previous Signature: 01/28/2015 2:27:59 PM Version By: Montey Hora Previous Signature: 01/29/2015 8:37:21 AM Version By: Judene Companion MD Previous Signature: 01/27/2015 10:16:43 AM Version By: Judene Companion MD Entered By: Montey Hora on 01/29/2015 09:18:14 MONTIE, SWIDERSKI ([id number]) -------------------------------------------------------------------------------- ROS/PFSH Details DRAGON, THRUSH 01/27/2015 9:30 Patient Name: Date of Service: H. AM Medical Record Patient Account Number: [id number] Number: Treating RN: Baruch Gouty, RN, BSN,  Velva Harman Date of Birth/Sex: 10-Jun-1940 (74 y.o. Male) Other Clinician: Primary Care Physician: POLITE, RONALD Treating Jaylyn Booher, [place] Referring Physician: POLITE, RONALD Physician/Extender: Weeks in Treatment: 0 Information Obtained From Patient Wound History Do you currently have one or more open woundso No Eyes Complaints and Symptoms: No Complaints or Symptoms Ear/Nose/Mouth/Throat Complaints and Symptoms: No Complaints or Symptoms Medical History: Negative for: Chronic sinus problems/congestion; Middle ear problems Hematologic/Lymphatic Complaints and Symptoms: No Complaints or Symptoms Medical History: Positive for: Anemia Respiratory Complaints and Symptoms: No Complaints or Symptoms Cardiovascular Medical History: Positive for: Hypertension Gastrointestinal Complaints and Symptoms: No Complaints or Symptoms Medical HistoryTYSIN, SALADA ([id number]) Negative for: Cirrhosis ; Colitis; Crohnos; Hepatitis A; Hepatitis B; Hepatitis C Endocrine Complaints and Symptoms: No Complaints or Symptoms Medical History: Positive for: Type II Diabetes Time with diabetes: since 1993 Treated with: Insulin, Oral agents Blood sugar testing results: Breakfast: 94 Genitourinary Medical History: Past Medical History Notes: prostate cancer Immunological Complaints and Symptoms: No Complaints or Symptoms Medical History: Negative for: Lupus Erythematosus; Raynaudos; Scleroderma Integumentary (Skin) Complaints and Symptoms: No Complaints or Symptoms Musculoskeletal Complaints and Symptoms: No Complaints or Symptoms Medical History: Positive for: Gout Neurologic Complaints and Symptoms: No Complaints or Symptoms Oncologic Medical History: Positive for: Received Radiation - a year ago Balke, Natalie H. (  [id number]) Negative for: Received Chemotherapy Psychiatric Complaints and Symptoms: No Complaints or Symptoms Medical History: Negative for:  Anorexia/bulimia Family and Social History Former smoker - quit 6 years ago; Marital Status - Married; Alcohol Use: Never; Drug Use: No History; Caffeine Use: Daily - coffee; Financial Concerns: No; Food, Clothing or Shelter Needs: No; Support System Lacking: No; Transportation Concerns: No; Medical Power of Attorney: Yes - Theodora Blow Electronic Signature(s) Signed: 01/27/2015 5:06:20 PM By: Regan Lemming BSN, RN Signed: 01/29/2015 8:37:21 AM By: Judene Companion MD Entered By: Regan Lemming on 01/27/2015 09:36:03 KEMANI, DEMARAIS ([id number]) -------------------------------------------------------------------------------- SuperBill Details Patient Name: [name] Date of Service: 01/27/2015 Medical Record Number: [id number] Patient Account Number: [id number] Date of Birth/Sex: [birth year] (74 y.o. 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

## 2015-02-03 ENCOUNTER — Encounter: Payer: Medicare Other | Attending: Surgery | Admitting: Surgery

## 2015-02-03 DIAGNOSIS — R310 Gross hematuria: Secondary | ICD-10-CM | POA: Diagnosis not present

## 2015-02-03 DIAGNOSIS — I10 Essential (primary) hypertension: Secondary | ICD-10-CM | POA: Insufficient documentation

## 2015-02-03 DIAGNOSIS — N3041 Irradiation cystitis with hematuria: Secondary | ICD-10-CM | POA: Diagnosis present

## 2015-02-03 DIAGNOSIS — Z8546 Personal history of malignant neoplasm of prostate: Secondary | ICD-10-CM | POA: Insufficient documentation

## 2015-02-03 DIAGNOSIS — Z794 Long term (current) use of insulin: Secondary | ICD-10-CM | POA: Insufficient documentation

## 2015-02-03 DIAGNOSIS — Z87891 Personal history of nicotine dependence: Secondary | ICD-10-CM | POA: Diagnosis not present

## 2015-02-03 DIAGNOSIS — M109 Gout, unspecified: Secondary | ICD-10-CM | POA: Insufficient documentation

## 2015-02-03 DIAGNOSIS — E119 Type 2 diabetes mellitus without complications: Secondary | ICD-10-CM | POA: Diagnosis not present

## 2015-02-03 LAB — GLUCOSE, CAPILLARY
GLUCOSE-CAPILLARY: 143 mg/dL — AB (ref 65–99)
Glucose-Capillary: 219 mg/dL — ABNORMAL HIGH (ref 65–99)

## 2015-02-03 NOTE — Progress Notes (Signed)
[name], [name] ([id number]) Visit Report for 02/03/2015 HBO Details Patient Name: [name], [name]. Date of Service: 02/03/2015 8:00 AM Medical Record Number: [id number] Patient Account Number: [id number] Date of Birth/Sex: 1940-08-11 (75 y.o. 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 HBO Treatment Details Treatment Number: 1 Patient Type: Outpatient Chamber Type: Monoplace Chamber #: [id number] Treatment Protocol: 2.0 ATA with 90 minutes oxygen, and no air breaks Treatment Details Compression Rate Down: 1.5 psi / minute De-Compression Rate Up: 1.5 psi / minute Air breaks and breathing Compress Tx Pressure Decompress Decompress periods Begins Reached Begins Ends (leave unused spaces blank) Chamber Pressure 1 ATA 2.0 ATA - - - - - - 2.0 ATA 1 ATA Clock Time (24 hr) 08:38 08:50 - - - - - - 10:20 10:30 Treatment Length: 112 (minutes) Treatment Segments: 4 Capillary Blood Glucose Pre Capillary Blood Glucose (mg/dl): Post Capillary Blood Glucose (mg/dl): Vital Signs Capillary Blood Glucose Reference Range: 80 - 120 mg / dl HBO Diabetic Blood Glucose Intervention Range: <131 mg/dl or >249 mg/dl Time Vitals Blood Respiratory Capillary Blood Glucose Pulse Action Type: Pulse: Temperature: Taken: Pressure: Rate: Glucose (mg/dl): Meter #: Oximetry (%) Taken: Pre 08:20 150/74 90 18 97 219 1 none Post 10:37 122/76 72 18 97.5 143 1 none Treatment Response Treatment Completion Status: Treatment Completed without Adverse Event [name], [name] ([id number]) HBO Attestation I certify that I supervised this HBO  treatment in accordance with Medicare guidelines. A trained Yes emergency response team is readily available per hospital policies and procedures. Continue HBOT as ordered. Yes Electronic Signature(s) Signed: 02/03/2015 11:58:16 AM By: Christin Fudge MD, FACS Entered By: Christin Fudge on 02/03/2015 11:58:16 [name], [name] ([id number]) -------------------------------------------------------------------------------- HBO Safety Checklist Details Patient Name: [name] Date of Service: 02/03/2015 8:00 AM Medical Record Number: [id number] Patient Account Number: [id number] Date of Birth/Sex: 02/17/1940 (75 y.o. Male) Treating RN: Primary Care Physician: POLITE, RONALD Other Clinician: Jacqulyn Bath Referring Physician: POLITE, RONALD Treating Physician/Extender: Frann Rider in Treatment: 1 HBO Safety Checklist Items Safety Checklist Consent Form Signed Patient voided / foley secured and emptied When did you last eato 06:15 am Last dose of injectable or oral agent 05:45 am NA Ostomy pouch emptied and vented if applicable NA All implantable devices assessed, documented and approved NA Intravenous access site secured and place Valuables secured Linens and cotton and cotton/polyester blend (less than 51% polyester) Personal oil-based products / skin lotions / body lotions removed Wigs or hairpieces removed Smoking or tobacco materials removed Books / newspapers / magazines / loose paper removed Cologne, aftershave, perfume and deodorant removed Jewelry removed (may wrap wedding band) Make-up removed Hair care products removed Battery operated devices (external) removed Heating patches and chemical warmers removed NA Titanium eyewear removed NA Nail polish cured greater than 10 hours NA Casting material cured greater than 10 hours NA Hearing aids removed NA Loose dentures or partials removed NA Prosthetics have been removed Patient demonstrates correct use  of air break device (if applicable) Patient concerns have been addressed Patient grounding bracelet on and cord attached to chamber Specifics for Inpatients (complete in addition to above) Medication sheet sent with patient Intravenous medications needed or due during therapy sent with patient [name], [name] ([id number]) Drainage tubes (  e.g. nasogastric tube or chest tube secured and vented) Endotracheal or Tracheotomy tube secured Cuff deflated of air and inflated with saline Airway suctioned Electronic Signature(s) Signed: 02/03/2015 3:47:56 PM By: Lorine Bears RCP, RRT, CHT Entered By: Becky Sax, Amado Nash on 02/03/2015 08:42:11

## 2015-02-03 NOTE — Progress Notes (Signed)
[name], [name] ([id number]) Visit Report for 02/03/2015 Arrival Information Details Patient Name: [name], [name]. Date of Service: 02/03/2015 8:00 AM Medical Record Number: [id number] Patient Account Number: [id number] Date of Birth/Sex: 07-Jul-1940 (75 y.o. Male) Treating RN: Primary Care Physician: POLITE, RONALD Other Clinician: Jacqulyn Bath Referring Physician: POLITE, RONALD Treating Physician/Extender: Frann Rider in Treatment: 1 Visit Information History Since Last Visit Added or deleted any medications: No Patient Arrived: Ambulatory Any new allergies or adverse reactions: No Arrival Time: 08:10 Had a fall or experienced change in No Accompanied By: self activities of daily living that may affect Transfer Assistance: None risk of falls: Patient Identification Verified: Yes Signs or symptoms of abuse/neglect since last No Secondary Verification Process Yes visito Completed: Hospitalized since last visit: No Patient Requires Transmission-Based No Pain Present Now: No Precautions: Patient Has Alerts: No Electronic Signature(s) Signed: 02/03/2015 3:47:56 PM By: Lorine Bears RCP, RRT, CHT Entered By: Lorine Bears on 02/03/2015 08:40:03 [name] ([id number]) -------------------------------------------------------------------------------- Encounter Discharge Information Details Patient Name: [name] Date of Service: 02/03/2015 8:00 AM Medical Record Number: [id number] Patient Account Number: [id number] Date of Birth/Sex: [birth year] (75 y.o. Male) Treating RN: Primary Care Physician: POLITE, RONALD Other Clinician: Jacqulyn Bath Referring Physician: POLITE, RONALD Treating Physician/Extender: Frann Rider in Treatment: 1 Encounter Discharge Information Items Discharge Pain Level: 0 Discharge Condition: Stable Ambulatory Status: Ambulatory Discharge Destination: Home Transportation: Private  Auto family Accompanied By: member Schedule Follow-up Appointment: No Medication Reconciliation completed No and provided to Patient/Care Clarence Dunsmore: Clinical Summary of Care: Notes Patient has an HBO treatment scheduled on 02/04/15 at 08:00 am. Electronic Signature(s) Signed: 02/03/2015 3:47:56 PM By: Lorine Bears RCP, RRT, CHT Entered By: Lorine Bears on 02/03/2015 11:15:52 [name] ([id number]) -------------------------------------------------------------------------------- Vitals Details Patient Name: [name] Date of Service: 02/03/2015 8:00 AM Medical Record Number: [id number] Patient Account Number: [id number] Date of Birth/Sex: 12/12/1940 (75 y.o. Male) Treating RN: Primary Care Physician: POLITE, RONALD Other Clinician: Jacqulyn Bath Referring Physician: POLITE, RONALD Treating Physician/Extender: Frann Rider in Treatment: 1 Vital Signs Time Taken: 08:20 Temperature (F): 97.0 Height (in): 70 Pulse (bpm): 90 Weight (lbs): 243 Respiratory Rate (breaths/min): 18 Body Mass Index (BMI): 34.9 Blood Pressure (mmHg): 150/74 Capillary Blood Glucose (mg/dl): 219 Reference Range: 80 - 120 mg / dl Electronic Signature(s) Signed: 02/03/2015 3:47:56 PM By: Lorine Bears RCP, RRT, CHT Entered By: Lorine Bears on 02/03/2015 08:40:33

## 2015-02-04 ENCOUNTER — Encounter: Payer: Medicare Other | Admitting: Surgery

## 2015-02-04 DIAGNOSIS — N3041 Irradiation cystitis with hematuria: Secondary | ICD-10-CM | POA: Diagnosis not present

## 2015-02-04 LAB — GLUCOSE, CAPILLARY
GLUCOSE-CAPILLARY: 129 mg/dL — AB (ref 65–99)
Glucose-Capillary: 206 mg/dL — ABNORMAL HIGH (ref 65–99)

## 2015-02-04 NOTE — Progress Notes (Signed)
DEJOHN, IBARRA ([id number]) Visit Report for 02/03/2015 Physician Orders Details Patient Name: [name], [name]. Date of Service: 02/03/2015 8:00 AM Medical Record Number: [id number] Patient Account Number: [id number] Date of Birth/Sex: [birth year] (75 y.o. Male) Treating RN: Cornell Barman Primary Care Physician: POLITE, RONALD Other Clinician: Jacqulyn Bath Referring Physician: POLITE, RONALD Treating Physician/Extender: Frann Rider in Treatment: 1 Verbal / Phone Orders: Yes Clinician: Cornell Barman Read Back and Verified: Yes Diagnosis Coding Follow-up Appointments o Return Appointment in 2 weeks. o Other: - daily for HBO Hyperbaric Oxygen Therapy o Indication: - radiation cystitis o 2.0 ATA for 90 Minutes without Air Breaks o One treatment per day (delivered Monday through Friday unless otherwise specified in Special Instructions below): o Total # of Treatments: - 40 o Finger stick Blood Glucose Pre- and Post- HBOT Treatment. o Follow Hyperbaric Oxygen Glycemia Protocol GLYCEMIA INTERVENTIONS PROTOCOL PRE-HBO GLYCEMIA INTERVENTIONS ACTION INTERVENTION Obtain pre-HBO capillary blood 1 glucose (ensure physician order is in chart). A. Notify HBO physician and await physician orders. 2 If result is 70 mg/dl or below: B. If the result meets the hospital definition of a critical result, follow hospital policy. If result is 71 mg/dl to 130 mg/dl: A. Give patient an 8 ounce Glucerna Shake, an 8 ounce Ensure, or 8 ounces of a Glucerna/Ensure equivalent dietary supplement*. B. Wait 30 minutes. C. Retest patientos capillary blood glucose (CBG). D. If result greater than or equal to 110 mg/dl, proceed with HBO. If result less than 110 mg/dl, [name], [name] ([id number]) notify HBO physician and consider holding HBO. If result is 131 mg/dl to 249 mg/dl: A. Proceed with HBO. A. Notify HBO physician and await physician orders. B. It is  recommended to hold HBO and do blood/urine ketone If result is 250 mg/dl or greater: testing. C. If the result meets the hospital definition of a critical result, follow hospital policy. POST-HBO GLYCEMIA INTERVENTIONS ACTION INTERVENTION Obtain post HBO capillary blood 1 glucose (ensure physician order is in chart). A. Notify HBO physician and await physician orders. 2 If result is 70 mg/dl or below: B. If the result meets the hospital definition of a critical result, follow hospital policy. A. Give patient an 8 ounce Glucerna Shake, an 8 ounce Ensure, or 8 ounces of a Glucerna/Ensure equivalent dietary supplement*. B. Wait 15 minutes for symptoms of hypoglycemia (i.e. nervousness, anxiety, If result is 71 mg/dl to 100 mg/dl: sweating, chills, clamminess, irritability, confusion, tachycardia or dizziness). C. If patient asymptomatic, discharge patient. If patient symptomatic, repeat capillary blood glucose (CBG) and notify HBO physician. If result is 101 mg/dl to 249 mg/dl: A. Discharge patient. A. Notify HBO physician and await physician orders. B. It is recommended to do If result is 250 mg/dl or greater: blood/urine ketone testing. C. If the result meets the hospital definition of a critical result, follow hospital policy. *Juice or candies are NOT equivalent products. If patient refuses the Glucerna or Ensure, please consult the hospital dietitian for an appropriate substitute. Electronic Signature(s) [name], [name] ([id number]) Signed: 02/03/2015 90:24:09 PM By: Gretta Cool RN, BSN, Kim RN, BSN Signed: 02/03/2015 4:44:22 PM By: Christin Fudge MD, FACS Entered By: Gretta Cool RN, BSN, Kim on 02/03/2015 08:11:40 [name], [name] ([id number]) -------------------------------------------------------------------------------- [place] Details Patient Name: [name], [name]. Date of Service: 02/03/2015 Medical Record Number: [id number] Patient Account Number:  [id number] Date of Birth/Sex: [birth year] (75 y.o. 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

## 2015-02-05 ENCOUNTER — Encounter: Payer: Medicare Other | Admitting: Surgery

## 2015-02-05 DIAGNOSIS — N3041 Irradiation cystitis with hematuria: Secondary | ICD-10-CM | POA: Diagnosis not present

## 2015-02-05 LAB — GLUCOSE, CAPILLARY
GLUCOSE-CAPILLARY: 117 mg/dL — AB (ref 65–99)
Glucose-Capillary: 177 mg/dL — ABNORMAL HIGH (ref 65–99)

## 2015-02-05 NOTE — Progress Notes (Signed)
[name], [name] ([id number]) Visit Report for 02/04/2015 HBO Details [name] Date of Service: 02/04/2015 8:00 AM Patient Name: H. Patient Account Number: [id number] Medical Record Treating RN: [id number] Number: Other Clinician: Jacqulyn [name] Date of Birth/Sex: 29-May-1940 (75 y.o. 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 HBO Treatment Details Treatment Number: 2 Patient Type: Outpatient Chamber Type: Monoplace Chamber #: HBO #[id number] Treatment Protocol: 2.0 ATA with 90 minutes oxygen, and no air breaks Treatment Details Compression Rate Down: 1.5 psi / minute De-Compression Rate Up: 1.5 psi / minute Air breaks and breathing Compress Tx Pressure Decompress Decompress periods Begins Reached Begins Ends (leave unused spaces blank) Chamber Pressure 1 ATA 2.0 ATA - - - - - - 2.0 ATA 1 ATA Clock Time (24 hr) 08:22 08:34 - - - - - - 10:04 10:14 Treatment Length: 112 (minutes) Treatment Segments: 4 Capillary Blood Glucose Pre Capillary Blood Glucose (mg/dl): Post Capillary Blood Glucose (mg/dl): Vital Signs Capillary Blood Glucose Reference Range: 80 - 120 mg / dl HBO Diabetic Blood Glucose Intervention Range: <131 mg/dl or >249 mg/dl Time Vitals Blood Respiratory Capillary Blood Glucose Pulse Action Type: Pulse: Temperature: Taken: Pressure: Rate: Glucose (mg/dl): Meter #: Oximetry (%) Taken: Pre 08:06 160/84 90 18 98.7 206 1 none Post 10:19 130/70 72 18 98.4 129 1 none Treatment Response [name], [name] ([id number]) Treatment Completion Status: Treatment Completed without Adverse Event HBO Attestation I certify that I supervised this HBO  treatment in accordance with Medicare guidelines. A trained Yes emergency response team is readily available per hospital policies and procedures. Continue HBOT as ordered. Yes Electronic Signature(s) Signed: 02/05/2015 8:51:38 AM By: [name] Entered By: [name] on 02/04/2015 10:53:02 [name] ([id number]) -------------------------------------------------------------------------------- HBO Safety Checklist Details Patient Name: [name], [name] Date of Service: 02/04/2015 8:00 AM Medical Record Number: [id number] Patient Account Number: [id number] Date of Birth/Sex: 09/28/1940 (75 y.o. Male) Treating RN: Primary Care Physician: [name] Other Clinician: Jacqulyn [name] Referring Physician: POLITE, [name] Treating Physician/Extender: [name] in Treatment: 1 HBO Safety Checklist Items Safety Checklist Consent Form Signed Patient voided / foley secured and emptied When did you last eato 06:00 am Last dose of injectable or oral agent 06:00 am NA Ostomy pouch emptied and vented if applicable NA All implantable devices assessed, documented and approved NA Intravenous access site secured and place Valuables secured Linens and cotton and cotton/polyester blend (less than 51% polyester) Personal oil-based products / skin lotions / body lotions removed Wigs or hairpieces removed Smoking or tobacco materials removed Books / newspapers / magazines / loose paper removed Cologne, aftershave, perfume and deodorant removed Jewelry removed (may wrap wedding band) Make-up removed Hair care products removed Battery operated devices (external) removed Heating patches and chemical warmers removed NA Titanium eyewear removed NA Nail polish cured greater than 10 hours NA Casting material cured greater than 10 hours NA Hearing aids removed NA Loose dentures or partials removed NA Prosthetics have been removed Patient demonstrates correct  use of air break device (if applicable) Patient concerns have been addressed Patient grounding bracelet on and cord attached to chamber Specifics for Inpatients (complete in addition to above) Medication sheet sent with patient Intravenous medications needed or due during therapy sent with patient [name], [name] (  [id number]) Drainage tubes (e.g. nasogastric tube or chest tube secured and vented) Endotracheal or Tracheotomy tube secured Cuff deflated of air and inflated with saline Airway suctioned Electronic Signature(s) Signed: 02/04/2015 3:50:13 PM By: [name] Entered By: [name] on 02/04/2015 08:15:47

## 2015-02-05 NOTE — Progress Notes (Signed)
BAILY, SERPE ([id number]) Visit Report for 02/04/2015 Arrival Information Details Patient Name: [name], [name]. Date of Service: 02/04/2015 8:00 AM Medical Record Number: [id number] Patient Account Number: [id number] Date of Birth/Sex: 12/20/1940 (75 y.o. Male) Treating RN: Primary Care Physician: POLITE, RONALD Other Clinician: Jacqulyn Bath Referring Physician: POLITE, RONALD Treating Physician/Extender: Frann Rider in Treatment: 1 Visit Information History Since Last Visit Added or deleted any medications: No Patient Arrived: Ambulatory Any new allergies or adverse reactions: No Arrival Time: 08:05 Had a fall or experienced change in No Accompanied By: self activities of daily living that may affect Transfer Assistance: None risk of falls: Patient Identification Verified: Yes Signs or symptoms of abuse/neglect since last No Secondary Verification Process Yes visito Completed: Hospitalized since last visit: No Patient Requires Transmission-Based No Pain Present Now: No Precautions: Patient Has Alerts: No Electronic Signature(s) Signed: 02/04/2015 3:50:13 PM By: Lorine Bears RCP, RRT, CHT Entered By: Becky Sax, Amado Nash on 02/04/2015 08:14:41 [name] ([id number]) -------------------------------------------------------------------------------- Encounter Discharge Information Details Marvel Plan Date of Service: 02/04/2015 8:00 AM Patient Name: H. Patient Account Number: [id number] Medical Record Treating RN: [id number] Number: Other Clinician: Jacqulyn Bath Date of Birth/Sex: [birth year] (75 y.o. Male) Treating BURNS III, Primary Care Physician: POLITE, RONALD Physician/Extender: Thayer Jew Referring Physician: POLITE, RONALD Weeks in Treatment: 1 Encounter Discharge Information Items Discharge Pain Level: 0 Discharge Condition: Stable Ambulatory Status: Ambulatory Discharge Destination: Home Transportation:  Private Auto family Accompanied By: member Schedule Follow-up Appointment: No Medication Reconciliation completed No and provided to Patient/Care Alexander Mcauley: Clinical Summary of Care: Notes Patient has an HBO treatment scheduled on 02/05/15 at 08:00 am. Electronic Signature(s) Signed: 02/04/2015 3:50:13 PM By: Lorine Bears RCP, RRT, CHT Entered By: Lorine Bears on 02/04/2015 10:32:13 [name] ([id number]) -------------------------------------------------------------------------------- Vitals Details Patient Name: [name] Date of Service: 02/04/2015 8:00 AM Medical Record Number: [id number] Patient Account Number: [id number] Date of Birth/Sex: 10-15-1940 (75 y.o. Male) Treating RN: Primary Care Physician: POLITE, RONALD Other Clinician: Jacqulyn Bath Referring Physician: POLITE, RONALD Treating Physician/Extender: Frann Rider in Treatment: 1 Vital Signs Time Taken: 08:06 Temperature (F): 98.7 Height (in): 70 Pulse (bpm): 90 Weight (lbs): 243 Respiratory Rate (breaths/min): 18 Body Mass Index (BMI): 34.9 Blood Pressure (mmHg): 160/84 Capillary Blood Glucose (mg/dl): 206 Reference Range: 80 - 120 mg / dl Electronic Signature(s) Signed: 02/04/2015 3:50:13 PM By: Lorine Bears RCP, RRT, CHT Entered By: Lorine Bears on 02/04/2015 08:15:10

## 2015-02-06 ENCOUNTER — Encounter: Payer: Medicare Other | Admitting: Surgery

## 2015-02-06 DIAGNOSIS — N3041 Irradiation cystitis with hematuria: Secondary | ICD-10-CM | POA: Diagnosis not present

## 2015-02-06 LAB — GLUCOSE, CAPILLARY
GLUCOSE-CAPILLARY: 184 mg/dL — AB (ref 65–99)
GLUCOSE-CAPILLARY: 116 mg/dL — AB (ref 65–99)

## 2015-02-06 NOTE — Progress Notes (Signed)
[name] ([id number]) Visit Report for 02/05/2015 HBO Details Patient Name: [name], [name]. Date of Service: 02/05/2015 8:00 AM Medical Record Number: [id number] Patient Account Number: [id number] Date of Birth/Sex: 09-01-1940 (75 y.o. 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 HBO Treatment Details Treatment Number: 3 Patient Type: Outpatient Chamber Type: Monoplace Chamber #: [id number]#[id number] Treatment Protocol: 2.0 ATA with 90 minutes oxygen, and no air breaks Treatment Details Compression Rate Down: 1.5 psi / minute De-Compression Rate Up: 1.5 psi / minute Air breaks and breathing Compress Tx Pressure Decompress Decompress periods Begins Reached Begins Ends (leave unused spaces blank) Chamber Pressure 1 ATA 2.0 ATA - - - - - - 2.0 ATA 1 ATA Clock Time (24 hr) 08:17 08:29 - - - - - - 10:00 10:10 Treatment Length: 113 (minutes) Treatment Segments: 4 Capillary Blood Glucose Pre Capillary Blood Glucose (mg/dl): Post Capillary Blood Glucose (mg/dl): Vital Signs Capillary Blood Glucose Reference Range: 80 - 120 mg / dl HBO Diabetic Blood Glucose Intervention Range: <131 mg/dl or >249 mg/dl Time Vitals Blood Respiratory Capillary Blood Glucose Pulse Action Type: Pulse: Temperature: Taken: Pressure: Rate: Glucose (mg/dl): Meter #: Oximetry (%) Taken: Pre 08:04 142/68 78 18 97.5 177 1 none Post 10:14 122/70 78 18 98.6 117 1 none Treatment Response Treatment Completion Status: Treatment Completed without Adverse Event EESHAN, VERBRUGGE ([id number]) HBO Attestation I certify that I supervised this HBO  treatment in accordance with Medicare guidelines. A trained Yes emergency response team is readily available per hospital policies and procedures. Continue HBOT as ordered. Yes Electronic Signature(s) Signed: 02/05/2015 11:25:16 AM By: Christin Fudge MD, FACS Entered By: Christin Fudge on 02/05/2015 11:25:16 ZEPHANIAH, LUBRANO ([id number]) -------------------------------------------------------------------------------- HBO Safety Checklist Details Patient Name: [name] Date of Service: 02/05/2015 8:00 AM Medical Record Number: [id number] Patient Account Number: [id number] Date of Birth/Sex: October 15, 1940 (75 y.o. Male) Treating RN: Primary Care Physician: POLITE, RONALD Other Clinician: Jacqulyn Bath Referring Physician: POLITE, RONALD Treating Physician/Extender: Frann Rider in Treatment: 1 HBO Safety Checklist Items Safety Checklist Consent Form Signed Patient voided / foley secured and emptied When did you last eato 06:00 am Last dose of injectable or oral agent 06:00 am NA Ostomy pouch emptied and vented if applicable NA All implantable devices assessed, documented and approved NA Intravenous access site secured and place Valuables secured Linens and cotton and cotton/polyester blend (less than 51% polyester) Personal oil-based products / skin lotions / body lotions removed Wigs or hairpieces removed Smoking or tobacco materials removed Books / newspapers / magazines / loose paper removed Cologne, aftershave, perfume and deodorant removed Jewelry removed (may wrap wedding band) Make-up removed Hair care products removed Battery operated devices (external) removed Heating patches and chemical warmers removed NA Titanium eyewear removed NA Nail polish cured greater than 10 hours NA Casting material cured greater than 10 hours NA Hearing aids removed NA Loose dentures or partials removed NA Prosthetics have been removed Patient demonstrates correct use  of air break device (if applicable) Patient concerns have been addressed Patient grounding bracelet on and cord attached to chamber Specifics for Inpatients (complete in addition to above) Medication sheet sent with patient Intravenous medications needed or due during therapy sent with patient DAMEIN, GAUNCE ([id number]) Drainage tubes (  e.g. nasogastric tube or chest tube secured and vented) Endotracheal or Tracheotomy tube secured Cuff deflated of air and inflated with saline Airway suctioned Electronic Signature(s) Signed: 02/05/2015 3:35:39 PM By: Lorine Bears RCP, RRT, CHT Entered By: Lorine Bears on 02/05/2015 08:26:16

## 2015-02-06 NOTE — Progress Notes (Signed)
KUTLER, VANVRANKEN ([id number]) Visit Report for 02/05/2015 Arrival Information Details Patient Name: [name], [name]. Date of Service: 02/05/2015 8:00 AM Medical Record Number: [id number] Patient Account Number: [id number] Date of Birth/Sex: [birth year] (75 y.o. Male) Treating RN: Primary Care Physician: POLITE, RONALD Other Clinician: Jacqulyn Bath Referring Physician: POLITE, RONALD Treating Physician/Extender: Frann Rider in Treatment: 1 Visit Information History Since Last Visit Added or deleted any medications: No Patient Arrived: Ambulatory Any new allergies or adverse reactions: No Arrival Time: 08:02 Had a fall or experienced change in No Accompanied By: self activities of daily living that may affect Transfer Assistance: None risk of falls: Patient Identification Verified: Yes Signs or symptoms of abuse/neglect since last No Secondary Verification Process Yes visito Completed: Hospitalized since last visit: No Patient Requires Transmission-Based No Pain Present Now: No Precautions: Patient Has Alerts: No Electronic Signature(s) Signed: 02/05/2015 3:35:39 PM By: Lorine Bears RCP, RRT, CHT Entered By: Lorine Bears on 02/05/2015 08:24:50 [name] ([id number]) -------------------------------------------------------------------------------- Encounter Discharge Information Details Patient Name: [name] Date of Service: 02/05/2015 8:00 AM Medical Record Number: [id number] Patient Account Number: [id number] Date of Birth/Sex: [birth year] (75 y.o. Male) Treating RN: Primary Care Physician: POLITE, RONALD Other Clinician: Jacqulyn Bath Referring Physician: POLITE, RONALD Treating Physician/Extender: Frann Rider in Treatment: 1 Encounter Discharge Information Items Discharge Pain Level: 0 Discharge Condition: Stable Ambulatory Status: Ambulatory Discharge Destination: Home Transportation: Private  Auto family Accompanied By: member Schedule Follow-up Appointment: No Medication Reconciliation completed No and provided to Patient/Care Chasity Outten: Clinical Summary of Care: Notes Patient has an HBO treatment scheduled on 02/06/15 at 08:00 am. Electronic Signature(s) Signed: 02/05/2015 3:35:39 PM By: Lorine Bears RCP, RRT, CHT Entered By: Lorine Bears on 02/05/2015 10:39:14 [name] ([id number]) -------------------------------------------------------------------------------- Vitals Details Patient Name: [name] Date of Service: 02/05/2015 8:00 AM Medical Record Number: [id number] Patient Account Number: [id number] Date of Birth/Sex: [birth year] (75 y.o. Male) Treating RN: Primary Care Physician: POLITE, RONALD Other Clinician: Jacqulyn Bath Referring Physician: POLITE, RONALD Treating Physician/Extender: Frann Rider in Treatment: 1 Vital Signs Time Taken: 08:04 Temperature (F): 97.5 Height (in): 70 Pulse (bpm): 78 Weight (lbs): 243 Respiratory Rate (breaths/min): 18 Body Mass Index (BMI): 34.9 Blood Pressure (mmHg): 142/68 Capillary Blood Glucose (mg/dl): 177 Reference Range: 80 - 120 mg / dl Electronic Signature(s) Signed: 02/05/2015 3:35:39 PM By: Lorine Bears RCP, RRT, CHT Entered By: Lorine Bears on 02/05/2015 08:25:33

## 2015-02-07 NOTE — Progress Notes (Signed)
ARIYON, MITTLEMAN ([id number]) Visit Report for 02/06/2015 HBO Details Patient Name: ANTWAIN, CALIENDO. Date of Service: 02/06/2015 8:00 AM Medical Record Number: [id number] Patient Account Number: [id number] Date of Birth/Sex: 1940-09-26 (75 y.o. 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 HBO Treatment Details Treatment Number: 4 Patient Type: Outpatient Chamber Type: Monoplace Chamber #: [id number] Treatment Protocol: 2.0 ATA with 90 minutes oxygen, and no air breaks Treatment Details Compression Rate Down: 1.5 psi / minute De-Compression Rate Up: 1.5 psi / minute Air breaks and breathing Compress Tx Pressure Decompress Decompress periods Begins Reached Begins Ends (leave unused spaces blank) Chamber Pressure 1 ATA 2.0 ATA - - - - - - 2.0 ATA 1 ATA Clock Time (24 hr) 08:21 08:33 - - - - - - 10:03 10:13 Treatment Length: 112 (minutes) Treatment Segments: 4 Capillary Blood Glucose Pre Capillary Blood Glucose (mg/dl): Post Capillary Blood Glucose (mg/dl): Vital Signs Capillary Blood Glucose Reference Range: 80 - 120 mg / dl HBO Diabetic Blood Glucose Intervention Range: <131 mg/dl or >249 mg/dl Time Vitals Blood Respiratory Capillary Blood Glucose Pulse Action Type: Pulse: Temperature: Taken: Pressure: Rate: Glucose (mg/dl): Meter #: Oximetry (%) Taken: Pre 08:03 142/80 90 18 97.1 184 1 none Post 10:18 128/70 72 18 98.2 116 1 none Treatment Response Treatment Completion Status: Treatment Completed without Adverse Event ANTONIUS, HARTLAGE ([id number]) HBO Attestation I certify that I supervised this HBO  treatment in accordance with Medicare guidelines. A trained Yes emergency response team is readily available per hospital policies and procedures. Continue HBOT as ordered. Yes Electronic Signature(s) Signed: 02/06/2015 11:23:34 AM By: Christin Fudge MD, FACS Entered By: Christin Fudge on 02/06/2015 11:23:34 SHYQUAN, STALLBAUMER ([id number]) -------------------------------------------------------------------------------- HBO Safety Checklist Details Patient Name: [name] Date of Service: 02/06/2015 8:00 AM Medical Record Number: [id number] Patient Account Number: [id number] Date of Birth/Sex: 1941/01/22 (75 y.o. Male) Treating RN: Primary Care Physician: POLITE, RONALD Other Clinician: Jacqulyn Bath Referring Physician: POLITE, RONALD Treating Physician/Extender: Frann Rider in Treatment: 1 HBO Safety Checklist Items Safety Checklist Consent Form Signed Patient voided / foley secured and emptied When did you last eato 06:00 am Last dose of injectable or oral agent 06:00 am NA Ostomy pouch emptied and vented if applicable NA All implantable devices assessed, documented and approved NA Intravenous access site secured and place Valuables secured Linens and cotton and cotton/polyester blend (less than 51% polyester) Personal oil-based products / skin lotions / body lotions removed Wigs or hairpieces removed Smoking or tobacco materials removed Books / newspapers / magazines / loose paper removed Cologne, aftershave, perfume and deodorant removed Jewelry removed (may wrap wedding band) Make-up removed Hair care products removed Battery operated devices (external) removed Heating patches and chemical warmers removed NA Titanium eyewear removed NA Nail polish cured greater than 10 hours NA Casting material cured greater than 10 hours NA Hearing aids removed NA Loose dentures or partials removed NA Prosthetics have been removed Patient demonstrates correct use  of air break device (if applicable) Patient concerns have been addressed Patient grounding bracelet on and cord attached to chamber Specifics for Inpatients (complete in addition to above) Medication sheet sent with patient Intravenous medications needed or due during therapy sent with patient SLOANE, JUNKIN ([id number]) Drainage tubes (  e.g. nasogastric tube or chest tube secured and vented) Endotracheal or Tracheotomy tube secured Cuff deflated of air and inflated with saline Airway suctioned Electronic Signature(s) Signed: 02/06/2015 3:12:17 PM By: Lorine Bears RCP, RRT, CHT Entered By: Becky Sax, Amado Nash on 02/06/2015 08:26:55

## 2015-02-07 NOTE — Progress Notes (Signed)
MUHAMMED, TEUTSCH ([id number]) Visit Report for 02/06/2015 Arrival Information Details Patient Name: [name], [name]. Date of Service: 02/06/2015 8:00 AM Medical Record Number: [id number] Patient Account Number: [id number] Date of Birth/Sex: March 23, 1940 (75 y.o. Male) Treating RN: Primary Care Physician: POLITE, RONALD Other Clinician: Jacqulyn Bath Referring Physician: POLITE, RONALD Treating Physician/Extender: Frann Rider in Treatment: 1 Visit Information History Since Last Visit Added or deleted any medications: No Patient Arrived: Ambulatory Any new allergies or adverse reactions: No Arrival Time: 08:25 Had a fall or experienced change in No Accompanied By: family activities of daily living that may affect member risk of falls: Transfer Assistance: None Signs or symptoms of abuse/neglect since last No Patient Identification Verified: Yes visito Secondary Verification Process Yes Hospitalized since last visit: No Completed: Pain Present Now: No Patient Requires Transmission- No Based Precautions: Patient Has Alerts: No Electronic Signature(s) Signed: 02/06/2015 3:12:17 PM By: Lorine Bears RCP, RRT, CHT Entered By: Lorine Bears on 02/06/2015 08:25:50 [name] ([id number]) -------------------------------------------------------------------------------- Encounter Discharge Information Details Patient Name: [name] Date of Service: 02/06/2015 8:00 AM Medical Record Number: [id number] Patient Account Number: [id number] Date of Birth/Sex: [birth year] (75 y.o. Male) Treating RN: Primary Care Physician: POLITE, RONALD Other Clinician: Jacqulyn Bath Referring Physician: POLITE, RONALD Treating Physician/Extender: Frann Rider in Treatment: 1 Encounter Discharge Information Items Discharge Pain Level: 0 Discharge Condition: Stable Ambulatory Status: Ambulatory Discharge Destination: Home Transportation:  Private Auto family Accompanied By: member Schedule Follow-up Appointment: No Medication Reconciliation completed No and provided to Patient/Care [name]: Clinical Summary of Care: Notes Patient has an HBO treatment scheduled on 02/09/15 at 08:00 am. Electronic Signature(s) Signed: 02/06/2015 3:12:17 PM By: Lorine Bears RCP, RRT, CHT Entered By: Lorine Bears on 02/06/2015 10:38:28 [name] ([id number]) -------------------------------------------------------------------------------- Vitals Details Patient Name: [name] Date of Service: 02/06/2015 8:00 AM Medical Record Number: [id number] Patient Account Number: [id number] Date of Birth/Sex: Jan 02, 1941 (75 y.o. Male) Treating RN: Primary Care Physician: POLITE, RONALD Other Clinician: Jacqulyn Bath Referring Physician: POLITE, RONALD Treating Physician/Extender: Frann Rider in Treatment: 1 Vital Signs Time Taken: 08:03 Temperature (F): 97.1 Height (in): 70 Pulse (bpm): 90 Weight (lbs): 243 Respiratory Rate (breaths/min): 18 Body Mass Index (BMI): 34.9 Blood Pressure (mmHg): 142/80 Capillary Blood Glucose (mg/dl): 184 Reference Range: 80 - 120 mg / dl Electronic Signature(s) Signed: 02/06/2015 3:12:17 PM By: Lorine Bears RCP, RRT, CHT Entered By: Becky Sax, Amado Nash on 02/06/2015 08:26:17

## 2015-02-09 ENCOUNTER — Encounter: Payer: BLUE CROSS/BLUE SHIELD | Admitting: Surgery

## 2015-02-10 ENCOUNTER — Encounter: Payer: Medicare Other | Admitting: Surgery

## 2015-02-10 DIAGNOSIS — N3041 Irradiation cystitis with hematuria: Secondary | ICD-10-CM | POA: Diagnosis not present

## 2015-02-10 LAB — GLUCOSE, CAPILLARY: GLUCOSE-CAPILLARY: 123 mg/dL — AB (ref 65–99)

## 2015-02-11 ENCOUNTER — Encounter: Payer: Medicare Other | Admitting: Surgery

## 2015-02-11 DIAGNOSIS — N3041 Irradiation cystitis with hematuria: Secondary | ICD-10-CM | POA: Diagnosis not present

## 2015-02-11 LAB — GLUCOSE, CAPILLARY
GLUCOSE-CAPILLARY: 138 mg/dL — AB (ref 65–99)
GLUCOSE-CAPILLARY: 173 mg/dL — AB (ref 65–99)
GLUCOSE-CAPILLARY: 111 mg/dL — AB (ref 65–99)

## 2015-02-11 NOTE — Progress Notes (Signed)
JAYLENN, ALTIER ([id number]) Visit Report for 02/11/2015 Arrival Information Details RITESH, OPARA Date of Service: 02/11/2015 8:00 AM Patient Name: H. Patient Account Number: [id number] Medical Record Treating RN: [id number] Number: Other Clinician: Jacqulyn Bath Date of Birth/Sex: July 20, 1940 (75 y.o. Male) Treating BURNS III, Primary Care Physician: POLITE, RONALD Physician/Extender: Thayer Jew Referring Physician: POLITE, RONALD Weeks in Treatment: 2 Visit Information History Since Last Visit Added or deleted any medications: No Patient Arrived: Ambulatory Any new allergies or adverse reactions: No Arrival Time: 07:50 Had a fall or experienced change in No Accompanied By: family activities of daily living that may affect member risk of falls: Transfer Assistance: None Signs or symptoms of abuse/neglect since last No Patient Identification Verified: Yes visito Secondary Verification Process Yes Hospitalized since last visit: No Completed: Pain Present Now: No Patient Requires Transmission- No Based Precautions: Patient Has Alerts: No Electronic Signature(s) Signed: 02/11/2015 1:19:41 PM By: Lorine Bears RCP, RRT, CHT Entered By: Becky Sax, Amado Nash on 02/11/2015 07:58:10 [name] ([id number]) -------------------------------------------------------------------------------- Encounter Discharge Information Details Marvel Plan Date of Service: 02/11/2015 8:00 AM Patient Name: H. Patient Account Number: [id number] Medical Record Treating RN: [id number] Number: Other Clinician: Jacqulyn Bath Date of Birth/Sex: Sep 20, 1940 (75 y.o. Male) Treating BURNS III, Primary Care Physician: POLITE, RONALD Physician/Extender: Thayer Jew Referring Physician: POLITE, Lowell Bouton in Treatment: 2 Encounter Discharge Information Items Discharge Pain Level: 0 Discharge Condition: Stable Ambulatory Status: Ambulatory Discharge Destination:  Home Transportation: Private Auto family Accompanied By: member Schedule Follow-up Appointment: No Medication Reconciliation completed No and provided to Patient/Care Irine Heminger: Clinical Summary of Care: Notes Patient has an HBO treatment scheduled on 02/12/15 at 08:00 am. Electronic Signature(s) Signed: 02/11/2015 1:19:41 PM By: Lorine Bears RCP, RRT, CHT Entered By: Becky Sax, Amado Nash on 02/11/2015 10:24:47 [name] ([id number]) -------------------------------------------------------------------------------- Vitals Details Marvel Plan Date of Service: 02/11/2015 8:00 AM Patient Name: H. Patient Account Number: [id number] Medical Record Treating RN: [id number] Number: Other Clinician: Jacqulyn Bath Date of Birth/Sex: [birth year] (74 y.o. Male) Treating BURNS III, Primary Care Physician: POLITE, RONALD Physician/Extender: Thayer Jew Referring Physician: POLITE, RONALD Weeks in Treatment: 2 Vital Signs Time Taken: 07:54 Temperature (F): 98.3 Height (in): 70 Pulse (bpm): 90 Weight (lbs): 243 Respiratory Rate (breaths/min): 18 Body Mass Index (BMI): 34.9 Blood Pressure (mmHg): 144/80 Capillary Blood Glucose (mg/dl): 173 Reference Range: 80 - 120 mg / dl Electronic Signature(s) Signed: 02/11/2015 1:19:41 PM By: Lorine Bears RCP, RRT, CHT Entered By: Becky Sax, Amado Nash on 02/11/2015 07:58:31

## 2015-02-11 NOTE — Progress Notes (Signed)
AUNDREY, ELAHI ([id number]) Visit Report for 02/10/2015 HBO Details Patient Name: [name], [name]. Date of Service: 02/10/2015 1:00 PM Medical Record Number: [id number] Patient Account Number: [id number] Date of Birth/Sex: [birth year] (75 y.o. 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 HBO Treatment Details Treatment Number: 5 Patient Type: Outpatient Chamber Type: Monoplace Chamber #: [id number] Treatment Protocol: 2.0 ATA with 90 minutes oxygen, and no air breaks Treatment Details Compression Rate Down: 1.5 psi / minute De-Compression Rate Up: 1.5 psi / minute Air breaks and breathing Compress Tx Pressure Decompress Decompress periods Begins Reached Begins Ends (leave unused spaces blank) Chamber Pressure 1 ATA 2.0 ATA - - - - - - 2.0 ATA 1 ATA Clock Time (24 hr) 13:17 13:29 - - - - - - 14:59 15:09 Treatment Length: 112 (minutes) Treatment Segments: 4 Capillary Blood Glucose Pre Capillary Blood Glucose (mg/dl): Post Capillary Blood Glucose (mg/dl): Vital Signs Capillary Blood Glucose Reference Range: 80 - 120 mg / dl HBO Diabetic Blood Glucose Intervention Range: <131 mg/dl or >249 mg/dl Time Vitals Blood Respiratory Capillary Blood Glucose Pulse Action Type: Pulse: Temperature: Taken: Pressure: Rate: Glucose (mg/dl): Meter #: Oximetry (%) Taken: Pre 12:48 132/76 90 18 98.8 138 1 none Post 15:14 128/70 72 18 98.4 123 1 none Treatment Response Treatment Completion Status: Treatment Completed without Adverse Event CLEOPHAS, YOAK. ([id number]) Treatment Notes Patient's blood glucose is in range but  is in low range (138). Patient has not eaten since between 8:00 and 9:00 am this morning. Dr. Con Memos would like for the patient to have some orange juice and peanut butter crackers before diving. HBO Attestation I certify that I supervised this HBO treatment in accordance with Medicare guidelines. A trained Yes emergency response team is readily available per hospital policies and procedures. Continue HBOT as ordered. Yes Electronic Signature(s) Signed: 02/10/2015 3:47:09 PM By: Christin Fudge MD, FACS Entered By: Christin Fudge on 02/10/2015 15:47:09 BRYDON, SPAHR ([id number]) -------------------------------------------------------------------------------- HBO Safety Checklist Details Patient Name: [name] Date of Service: 02/10/2015 1:00 PM Medical Record Number: [id number] Patient Account Number: [id number] Date of Birth/Sex: [birth year] (75 y.o. Male) Treating RN: Primary Care Physician: POLITE, RONALD Other Clinician: Jacqulyn Bath Referring Physician: POLITE, RONALD Treating Physician/Extender: Frann Rider in Treatment: 2 HBO Safety Checklist Items Safety Checklist Consent Form Signed Patient voided / foley secured and emptied When did you last eato 09:00 am Last dose of injectable or oral agent 06:00 am NA Ostomy pouch emptied and vented if applicable NA All implantable devices assessed, documented and approved NA Intravenous access site secured and place Valuables secured Linens and cotton and cotton/polyester blend (less than 51% polyester) Personal oil-based products / skin lotions / body lotions removed Wigs or hairpieces removed Smoking or tobacco materials removed Books / newspapers / magazines / loose paper removed Cologne, aftershave, perfume and deodorant removed Jewelry removed (may wrap wedding band) Make-up removed Hair care products removed Battery operated devices (external) removed Heating patches and chemical warmers  removed NA Titanium eyewear removed NA Nail polish cured greater than 10 hours NA Casting material cured greater than 10 hours NA Hearing aids removed NA Loose dentures or partials removed NA Prosthetics have been removed Patient demonstrates correct use of air break device (if  applicable) Patient concerns have been addressed Patient grounding bracelet on and cord attached to chamber Specifics for Inpatients (complete in addition to above) Medication sheet sent with patient Intravenous medications needed or due during therapy sent with patient DANYEL, GRIESS ([id number]) Drainage tubes (e.g. nasogastric tube or chest tube secured and vented) Endotracheal or Tracheotomy tube secured Cuff deflated of air and inflated with saline Airway suctioned Electronic Signature(s) Signed: 02/10/2015 4:47:37 PM By: Lorine Bears RCP, RRT, CHT Entered By: Lorine Bears on 02/10/2015 13:22:34

## 2015-02-11 NOTE — Progress Notes (Signed)
HANLEY, WOERNER ([id number]) Visit Report for 02/11/2015 HBO Details Marvel Plan Date of Service: 02/11/2015 8:00 AM Patient Name: H. Patient Account Number: [id number] Medical Record Treating RN: [id number] Number: Other Clinician: Jacqulyn Bath Date of Birth/Sex: 09/15/1940 (75 y.o. 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 HBO Treatment Details Treatment Number: 6 Patient Type: Outpatient Chamber Type: Monoplace Chamber #: [id number] Treatment Protocol: 2.0 ATA with 90 minutes oxygen, and no air breaks Treatment Details Compression Rate Down: 1.5 psi / minute De-Compression Rate Up: 1.5 psi / minute Air breaks and breathing Compress Tx Pressure Decompress Decompress periods Begins Reached Begins Ends (leave unused spaces blank) Chamber Pressure 1 ATA 2.0 ATA - - - - - - 2.0 ATA 1 ATA Clock Time (24 hr) 08:07 08:19 - - - - - - 09:49 10:00 Treatment Length: 113 (minutes) Treatment Segments: 4 Capillary Blood Glucose Pre Capillary Blood Glucose (mg/dl): Post Capillary Blood Glucose (mg/dl): Vital Signs Capillary Blood Glucose Reference Range: 80 - 120 mg / dl HBO Diabetic Blood Glucose Intervention Range: <131 mg/dl or >249 mg/dl Time Vitals Blood Respiratory Capillary Blood Glucose Pulse Action Type: Pulse: Temperature: Taken: Pressure: Rate: Glucose (mg/dl): Meter #: Oximetry (%) Taken: Pre 07:54 144/80 90 18 98.3 173 1 none Post 10:16 152/80 72 18 98 111 1 none Treatment Response [name], [name] ([id number]) Treatment Completion Status: Treatment Completed without Adverse Event HBO Attestation I certify that I supervised this HBO  treatment in accordance with Medicare guidelines. A trained Yes emergency response team is readily available per hospital policies and procedures. Continue HBOT as ordered. Yes Electronic Signature(s) Signed: 02/11/2015 3:52:06 PM By: Loletha Grayer MD Entered By: Loletha Grayer on 02/11/2015 11:58:59 [name] ([id number]) -------------------------------------------------------------------------------- HBO Safety Checklist Details Marvel Plan Date of Service: 02/11/2015 8:00 AM Patient Name: H. Patient Account Number: [id number] Medical Record Treating RN: [id number] Number: Other Clinician: Jacqulyn Bath Date of Birth/Sex: 07-18-1940 (75 y.o. Male) Treating BURNS III, Primary Care Physician: POLITE, RONALD Physician/Extender: Thayer Jew Referring Physician: POLITE, RONALD Weeks in Treatment: 2 HBO Safety Checklist Items Safety Checklist Consent Form Signed Patient voided / foley secured and emptied When did you last eato 07:00 am Last dose of injectable or oral agent 06:00 am NA Ostomy pouch emptied and vented if applicable NA All implantable devices assessed, documented and approved NA Intravenous access site secured and place Valuables secured Linens and cotton and cotton/polyester blend (less than 51% polyester) Personal oil-based products / skin lotions / body lotions removed Wigs or hairpieces removed Smoking or tobacco materials removed Books / newspapers / magazines / loose paper removed Cologne, aftershave, perfume and deodorant removed Jewelry removed (may wrap wedding band) Make-up removed Hair care products removed Battery operated devices (external) removed Heating patches and chemical warmers removed NA Titanium eyewear removed NA Nail polish cured greater than 10 hours NA Casting material cured greater than 10 hours NA Hearing aids removed NA Loose dentures or partials removed NA Prosthetics have been removed Patient demonstrates  correct use of air break device (if applicable) Patient concerns have been addressed Patient grounding bracelet on and cord attached to chamber Specifics for Inpatients (complete in addition to above) Medication sheet sent with patient [name] ([id number]) Intravenous medications needed or due during therapy sent with  patient Drainage tubes (e.g. nasogastric tube or chest tube secured and vented) Endotracheal or Tracheotomy tube secured Cuff deflated of air and inflated with saline Airway suctioned Electronic Signature(s) Signed: 02/11/2015 1:19:41 PM By: Lorine Bears RCP, RRT, CHT Entered By: Becky Sax, Amado Nash on 02/11/2015 07:59:07

## 2015-02-11 NOTE — Progress Notes (Signed)
BRANSTON, HALSTED ([id number]) Visit Report for 02/10/2015 Arrival Information Details Patient Name: [name], [name]. Date of Service: 02/10/2015 1:00 PM Medical Record Number: [id number] Patient Account Number: [id number] Date of Birth/Sex: 1940/10/03 (75 y.o. Male) Treating RN: Primary Care Physician: POLITE, RONALD Other Clinician: Jacqulyn Bath Referring Physician: POLITE, RONALD Treating Physician/Extender: Frann Rider in Treatment: 2 Visit Information History Since Last Visit Added or deleted any medications: No Patient Arrived: Ambulatory Any new allergies or adverse reactions: No Arrival Time: 12:45 Had a fall or experienced change in No Accompanied By: family activities of daily living that may affect member risk of falls: Transfer Assistance: None Signs or symptoms of abuse/neglect since last No Patient Identification Verified: Yes visito Secondary Verification Process Yes Hospitalized since last visit: No Completed: Pain Present Now: No Patient Requires Transmission- No Based Precautions: Patient Has Alerts: No Electronic Signature(s) Signed: 02/10/2015 4:47:37 PM By: Lorine Bears RCP, RRT, CHT Entered By: Lorine Bears on 02/10/2015 13:21:04 [name] ([id number]) -------------------------------------------------------------------------------- Encounter Discharge Information Details Patient Name: [name] Date of Service: 02/10/2015 1:00 PM Medical Record Number: [id number] Patient Account Number: [id number] Date of Birth/Sex: 1940-11-01 (75 y.o. Male) Treating RN: Primary Care Physician: POLITE, RONALD Other Clinician: Jacqulyn Bath Referring Physician: POLITE, RONALD Treating Physician/Extender: Frann Rider in Treatment: 2 Encounter Discharge Information Items Discharge Pain Level: 0 Discharge Condition: Stable Ambulatory Status: Ambulatory Discharge Destination:  Home Transportation: Private Auto family Accompanied By: member Schedule Follow-up Appointment: No Medication Reconciliation completed No and provided to Patient/Care [name]: Clinical Summary of Care: Notes Patient has an HBO treatment scheduled on1/11/17 at 08:00 am. Electronic Signature(s) Signed: 02/10/2015 4:47:37 PM By: Lorine Bears RCP, RRT, CHT Entered By: Lorine Bears on 02/10/2015 15:30:41 [name] ([id number]) -------------------------------------------------------------------------------- Vitals Details Patient Name: [name] Date of Service: 02/10/2015 1:00 PM Medical Record Number: [id number] Patient Account Number: [id number] Date of Birth/Sex: 15-Jan-1941 (75 y.o. Male) Treating RN: Primary Care Physician: POLITE, RONALD Other Clinician: Jacqulyn Bath Referring Physician: POLITE, RONALD Treating Physician/Extender: Frann Rider in Treatment: 2 Vital Signs Time Taken: 12:48 Temperature (F): 98.8 Height (in): 70 Pulse (bpm): 90 Weight (lbs): 243 Respiratory Rate (breaths/min): 18 Body Mass Index (BMI): 34.9 Blood Pressure (mmHg): 132/76 Capillary Blood Glucose (mg/dl): 138 Reference Range: 80 - 120 mg / dl Electronic Signature(s) Signed: 02/10/2015 4:47:37 PM By: Lorine Bears RCP, RRT, CHT Entered By: Becky Sax, Amado Nash on 02/10/2015 13:21:49

## 2015-02-12 ENCOUNTER — Encounter: Payer: Medicare Other | Admitting: Surgery

## 2015-02-12 DIAGNOSIS — N3041 Irradiation cystitis with hematuria: Secondary | ICD-10-CM | POA: Diagnosis not present

## 2015-02-12 LAB — GLUCOSE, CAPILLARY
Glucose-Capillary: 159 mg/dL — ABNORMAL HIGH (ref 65–99)
Glucose-Capillary: 103 mg/dL — ABNORMAL HIGH (ref 65–99)

## 2015-02-13 ENCOUNTER — Encounter: Payer: Medicare Other | Admitting: Surgery

## 2015-02-13 DIAGNOSIS — N3041 Irradiation cystitis with hematuria: Secondary | ICD-10-CM | POA: Diagnosis not present

## 2015-02-13 LAB — GLUCOSE, CAPILLARY
GLUCOSE-CAPILLARY: 126 mg/dL — AB (ref 65–99)
Glucose-Capillary: 208 mg/dL — ABNORMAL HIGH (ref 65–99)

## 2015-02-13 NOTE — Progress Notes (Signed)
[name], [name] ([id number]) Visit Report for 02/13/2015 HBO Details Patient Name: [name], [name]. Date of Service: 02/13/2015 8:00 AM Medical Record Number: [id number] Patient Account Number: [id number] Date of Birth/Sex: [birth year] (75 y.o. 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 HBO Treatment Details Treatment Number: 8 Patient Type: Outpatient Chamber Type: Monoplace Chamber #: [id number] Treatment Protocol: 2.0 ATA with 90 minutes oxygen, and no air breaks Treatment Details Compression Rate Down: 1.5 psi / minute De-Compression Rate Up: 1.5 psi / minute Air breaks and breathing Compress Tx Pressure Decompress Decompress periods Begins Reached Begins Ends (leave unused spaces blank) Chamber Pressure 1 ATA 2.0 ATA - - - - - - 2.0 ATA 1 ATA Clock Time (24 hr) 08:07 08:19 - - - - - - 09:49 10:01 Treatment Length: 114 (minutes) Treatment Segments: 4 Capillary Blood Glucose Pre Capillary Blood Glucose (mg/dl): Post Capillary Blood Glucose (mg/dl): Vital Signs Capillary Blood Glucose Reference Range: 80 - 120 mg / dl HBO Diabetic Blood Glucose Intervention Range: <131 mg/dl or >249 mg/dl Time Vitals Blood Respiratory Capillary Blood Glucose Pulse Action Type: Pulse: Temperature: Taken: Pressure: Rate: Glucose (mg/dl): Meter #: Oximetry (%) Taken: Pre 07:50 150/74 84 18 98.3 208 1 none Post 10:12 120/62 78 18 98 126 1 none Treatment Response Treatment Completion Status: Treatment Completed without Adverse Event [name], [name] ([id number]) HBO Attestation I certify that I supervised this HBO  treatment in accordance with Medicare guidelines. A trained Yes emergency response team is readily available per hospital policies and procedures. Continue HBOT as ordered. Yes Electronic Signature(s) Signed: 02/13/2015 10:42:50 AM By: Christin Fudge MD, FACS Entered By: Christin Fudge on 02/13/2015 10:42:50 [name], [name] ([id number]) -------------------------------------------------------------------------------- HBO Safety Checklist Details Patient Name: [name] Date of Service: 02/13/2015 8:00 AM Medical Record Number: [id number] Patient Account Number: [id number] Date of Birth/Sex: January 27, 1941 (75 y.o. Male) Treating RN: Primary Care Physician: POLITE, RONALD Other Clinician: Jacqulyn Bath Referring Physician: POLITE, RONALD Treating Physician/Extender: Frann Rider in Treatment: 2 HBO Safety Checklist Items Safety Checklist Consent Form Signed Patient voided / foley secured and emptied When did you last eato 07:00 am Last dose of injectable or oral agent 07:00 am NA Ostomy pouch emptied and vented if applicable NA All implantable devices assessed, documented and approved NA Intravenous access site secured and place Valuables secured Linens and cotton and cotton/polyester blend (less than 51% polyester) Personal oil-based products / skin lotions / body lotions removed Wigs or hairpieces removed Smoking or tobacco materials removed Books / newspapers / magazines / loose paper removed Cologne, aftershave, perfume and deodorant removed Jewelry removed (may wrap wedding band) Make-up removed Hair care products removed Battery operated devices (external) removed Heating patches and chemical warmers removed NA Titanium eyewear removed NA Nail polish cured greater than 10 hours NA Casting material cured greater than 10 hours NA Hearing aids removed NA Loose dentures or partials removed NA Prosthetics have been removed Patient demonstrates correct use  of air break device (if applicable) Patient concerns have been addressed Patient grounding bracelet on and cord attached to chamber Specifics for Inpatients (complete in addition to above) Medication sheet sent with patient Intravenous medications needed or due during therapy sent with patient [name], [name] ([id number]) Drainage tubes (  e.g. nasogastric tube or chest tube secured and vented) Endotracheal or Tracheotomy tube secured Cuff deflated of air and inflated with saline Airway suctioned Electronic Signature(s) Signed: 02/13/2015 12:42:54 PM By: Lorine Bears RCP, RRT, CHT Entered By: Becky Sax, Amado Nash on 02/13/2015 08:18:48

## 2015-02-13 NOTE — Progress Notes (Signed)
[name], [name] ([id number]) Visit Report for 02/12/2015 Arrival Information Details Patient Name: [name], [name]. Date of Service: 02/12/2015 8:00 AM Medical Record Number: [id number] Patient Account Number: [id number] Date of Birth/Sex: 05-Jan-1941 (75 y.o. Male) Treating RN: Primary Care Physician: POLITE, RONALD Other Clinician: Jacqulyn Bath Referring Physician: POLITE, RONALD Treating Physician/Extender: Frann Rider in Treatment: 2 Visit Information History Since Last Visit Added or deleted any medications: No Patient Arrived: Ambulatory Any new allergies or adverse reactions: No Arrival Time: 07:45 Had a fall or experienced change in No Accompanied By: family activities of daily living that may affect member risk of falls: Transfer Assistance: None Signs or symptoms of abuse/neglect since last No Patient Identification Verified: Yes visito Secondary Verification Process Yes Hospitalized since last visit: No Completed: Pain Present Now: No Patient Requires Transmission- No Based Precautions: Patient Has Alerts: No Electronic Signature(s) Signed: 02/12/2015 5:06:23 PM By: Lorine Bears RCP, RRT, CHT Entered By: Lorine Bears on 02/12/2015 07:59:44 [name] ([id number]) -------------------------------------------------------------------------------- Encounter Discharge Information Details Patient Name: [name] Date of Service: 02/12/2015 8:00 AM Medical Record Number: [id number] Patient Account Number: [id number] Date of Birth/Sex: 05/19/1940 (75 y.o. Male) Treating RN: Primary Care Physician: POLITE, RONALD Other Clinician: Jacqulyn Bath Referring Physician: POLITE, RONALD Treating Physician/Extender: Frann Rider in Treatment: 2 Encounter Discharge Information Items Discharge Pain Level: 0 Discharge Condition: Stable Ambulatory Status: Ambulatory Discharge Destination:  Home Transportation: Private Auto family Accompanied By: member Schedule Follow-up Appointment: No Medication Reconciliation completed No and provided to Patient/Care Kelli Robeck: Clinical Summary of Care: Notes Patient has an HBO treatment scheduled on 02/13/15 at 08:00 am. Electronic Signature(s) Signed: 02/12/2015 5:06:23 PM By: Lorine Bears RCP, RRT, CHT Entered By: Lorine Bears on 02/12/2015 10:24:33 [name] ([id number]) -------------------------------------------------------------------------------- Vitals Details Patient Name: [name] Date of Service: 02/12/2015 8:00 AM Medical Record Number: [id number] Patient Account Number: [id number] Date of Birth/Sex: 08/11/1940 (76 y.o. Male) Treating RN: Primary Care Physician: POLITE, RONALD Other Clinician: Jacqulyn Bath Referring Physician: POLITE, RONALD Treating Physician/Extender: Frann Rider in Treatment: 2 Vital Signs Time Taken: 07:52 Temperature (F): 98.4 Height (in): 70 Pulse (bpm): 72 Weight (lbs): 243 Respiratory Rate (breaths/min): 18 Body Mass Index (BMI): 34.9 Blood Pressure (mmHg): 140/74 Capillary Blood Glucose (mg/dl): 159 Reference Range: 80 - 120 mg / dl Electronic Signature(s) Signed: 02/12/2015 5:06:23 PM By: Lorine Bears RCP, RRT, CHT Entered By: Becky Sax, Amado Nash on 02/12/2015 08:04:14

## 2015-02-13 NOTE — Progress Notes (Signed)
[name], [name] ([id number]) Visit Report for 02/12/2015 HBO Details Patient Name: [name], [name]. Date of Service: 02/12/2015 8:00 AM Medical Record Number: [id number] Patient Account Number: [id number] Date of Birth/Sex: 18-Nov-1940 (75 y.o. 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 HBO Treatment Details Treatment Number: 7 Patient Type: Outpatient Chamber Type: Monoplace Chamber #: [id number] Treatment Protocol: 2.0 ATA with 90 minutes oxygen, and no air breaks Treatment Details Compression Rate Down: 1.5 psi / minute De-Compression Rate Up: 1.5 psi / minute Air breaks and breathing Compress Tx Pressure Decompress Decompress periods Begins Reached Begins Ends (leave unused spaces blank) Chamber Pressure 1 ATA 2.0 ATA - - - - - - 2.0 ATA 1 ATA Clock Time (24 hr) 08:04 08:16 - - - - - - 09:46 09:56 Treatment Length: 112 (minutes) Treatment Segments: 4 Capillary Blood Glucose Pre Capillary Blood Glucose (mg/dl): Post Capillary Blood Glucose (mg/dl): Vital Signs Capillary Blood Glucose Reference Range: 80 - 120 mg / dl HBO Diabetic Blood Glucose Intervention Range: <131 mg/dl or >249 mg/dl Time Vitals Blood Respiratory Capillary Blood Glucose Pulse Action Type: Pulse: Temperature: Taken: Pressure: Rate: Glucose (mg/dl): Meter #: Oximetry (%) Taken: Pre 07:52 140/74 72 18 98.4 159 1 none Post 10:10 128/72 72 18 98 103 1 none Treatment Response Treatment Completion Status: Treatment Completed without Adverse Event [name], [name] ([id number]) HBO Attestation I certify that I supervised this HBO  treatment in accordance with Medicare guidelines. A trained Yes emergency response team is readily available per hospital policies and procedures. Continue HBOT as ordered. Yes Electronic Signature(s) Signed: 02/12/2015 10:27:14 AM By: Christin Fudge MD, FACS Entered By: Christin Fudge on 02/12/2015 10:27:14 [name], [name] ([id number]) -------------------------------------------------------------------------------- HBO Safety Checklist Details Patient Name: [name] Date of Service: 02/12/2015 8:00 AM Medical Record Number: [id number] Patient Account Number: [id number] Date of Birth/Sex: [birth year] (75 y.o. Male) Treating RN: Primary Care Physician: POLITE, RONALD Other Clinician: Jacqulyn Bath Referring Physician: POLITE, RONALD Treating Physician/Extender: Frann Rider in Treatment: 2 HBO Safety Checklist Items Safety Checklist Consent Form Signed Patient voided / foley secured and emptied When did you last eato 07:00 am Last dose of injectable or oral agent 07:00 am NA Ostomy pouch emptied and vented if applicable NA All implantable devices assessed, documented and approved NA Intravenous access site secured and place Valuables secured Linens and cotton and cotton/polyester blend (less than 51% polyester) Personal oil-based products / skin lotions / body lotions removed Wigs or hairpieces removed Smoking or tobacco materials removed Books / newspapers / magazines / loose paper removed Cologne, aftershave, perfume and deodorant removed Jewelry removed (may wrap wedding band) Make-up removed Hair care products removed Battery operated devices (external) removed Heating patches and chemical warmers removed NA Titanium eyewear removed NA Nail polish cured greater than 10 hours NA Casting material cured greater than 10 hours NA Hearing aids removed NA Loose dentures or partials removed NA Prosthetics have been removed Patient demonstrates correct use  of air break device (if applicable) Patient concerns have been addressed Patient grounding bracelet on and cord attached to chamber Specifics for Inpatients (complete in addition to above) Medication sheet sent with patient Intravenous medications needed or due during therapy sent with patient [name], [name] ([id number]) Drainage tubes (  e.g. nasogastric tube or chest tube secured and vented) Endotracheal or Tracheotomy tube secured Cuff deflated of air and inflated with saline Airway suctioned Electronic Signature(s) Signed: 02/12/2015 5:06:23 PM By: Lorine Bears RCP, RRT, CHT Entered By: Becky Sax, Amado Nash on 02/12/2015 08:04:59

## 2015-02-13 NOTE — Progress Notes (Signed)
KONNAR, BEN ([id number]) Visit Report for 02/13/2015 Arrival Information Details Patient Name: [name], [name]. Date of Service: 02/13/2015 8:00 AM Medical Record Number: [id number] Patient Account Number: [id number] Date of Birth/Sex: Apr 12, 1940 (75 y.o. Male) Treating RN: Primary Care Physician: POLITE, RONALD Other Clinician: Jacqulyn Bath Referring Physician: POLITE, RONALD Treating Physician/Extender: Frann Rider in Treatment: 2 Visit Information History Since Last Visit Added or deleted any medications: No Patient Arrived: Ambulatory Any new allergies or adverse reactions: No Arrival Time: 07:45 Had a fall or experienced change in No Accompanied By: family activities of daily living that may affect member risk of falls: Transfer Assistance: None Signs or symptoms of abuse/neglect since last No Patient Identification Verified: Yes visito Secondary Verification Process Yes Hospitalized since last visit: No Completed: Pain Present Now: No Patient Requires Transmission- No Based Precautions: Patient Has Alerts: No Electronic Signature(s) Signed: 02/13/2015 12:42:54 PM By: Lorine Bears RCP, RRT, CHT Entered By: Lorine Bears on 02/13/2015 08:17:02 [name] ([id number]) -------------------------------------------------------------------------------- Encounter Discharge Information Details Patient Name: [name] Date of Service: 02/13/2015 8:00 AM Medical Record Number: [id number] Patient Account Number: [id number] Date of Birth/Sex: Dec 29, 1940 (75 y.o. Male) Treating RN: Primary Care Physician: POLITE, RONALD Other Clinician: Jacqulyn Bath Referring Physician: POLITE, RONALD Treating Physician/Extender: Frann Rider in Treatment: 2 Encounter Discharge Information Items Discharge Pain Level: 0 Discharge Condition: Stable Ambulatory Status: Ambulatory Discharge Destination:  Home Transportation: Private Auto family Accompanied By: member Schedule Follow-up Appointment: No Medication Reconciliation completed No and provided to Patient/Care Geffrey Michaelsen: Clinical Summary of Care: Notes Patient has an HBO treatment scheduled on 02/16/15 @ 08:00 am. Electronic Signature(s) Signed: 02/13/2015 12:42:54 PM By: Lorine Bears RCP, RRT, CHT Entered By: Lorine Bears on 02/13/2015 10:41:59 [name] ([id number]) -------------------------------------------------------------------------------- Vitals Details Patient Name: [name] Date of Service: 02/13/2015 8:00 AM Medical Record Number: [id number] Patient Account Number: [id number] Date of Birth/Sex: Feb 26, 1940 (75 y.o. Male) Treating RN: Primary Care Physician: POLITE, RONALD Other Clinician: Jacqulyn Bath Referring Physician: POLITE, RONALD Treating Physician/Extender: Frann Rider in Treatment: 2 Vital Signs Time Taken: 07:50 Temperature (F): 98.3 Height (in): 70 Pulse (bpm): 84 Weight (lbs): 243 Respiratory Rate (breaths/min): 18 Body Mass Index (BMI): 34.9 Blood Pressure (mmHg): 150/74 Capillary Blood Glucose (mg/dl): 208 Reference Range: 80 - 120 mg / dl Electronic Signature(s) Signed: 02/13/2015 12:42:54 PM By: Lorine Bears RCP, RRT, CHT Entered By: Lorine Bears on 02/13/2015 08:17:35

## 2015-02-16 ENCOUNTER — Encounter: Payer: Medicare Other | Admitting: Surgery

## 2015-02-16 DIAGNOSIS — N3041 Irradiation cystitis with hematuria: Secondary | ICD-10-CM | POA: Diagnosis not present

## 2015-02-16 LAB — GLUCOSE, CAPILLARY
GLUCOSE-CAPILLARY: 129 mg/dL — AB (ref 65–99)
GLUCOSE-CAPILLARY: 102 mg/dL — AB (ref 65–99)

## 2015-02-17 ENCOUNTER — Encounter: Payer: Medicare Other | Admitting: Surgery

## 2015-02-17 DIAGNOSIS — N3041 Irradiation cystitis with hematuria: Secondary | ICD-10-CM | POA: Diagnosis not present

## 2015-02-17 LAB — GLUCOSE, CAPILLARY: Glucose-Capillary: 120 mg/dL — ABNORMAL HIGH (ref 65–99)

## 2015-02-17 NOTE — Progress Notes (Signed)
[name] ([id number]) Visit Report for 02/16/2015 HBO Details Patient Name: [name], [name]. Date of Service: 02/16/2015 8:00 AM Medical Record Number: [id number] Patient Account Number: [id number] Date of Birth/Sex: 20-Oct-1940 (75 y.o. 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 HBO Treatment Details Treatment Number: 9 Patient Type: Outpatient Chamber Type: Monoplace Chamber #: [id number] Treatment Protocol: 2.0 ATA with 90 minutes oxygen, and no air breaks Treatment Details Compression Rate Down: 1.5 psi / minute De-Compression Rate Up: 1.5 psi / minute Air breaks and breathing Compress Tx Pressure Decompress Decompress periods Begins Reached Begins Ends (leave unused spaces blank) Chamber Pressure 1 ATA 2.0 ATA - - - - - - 2.0 ATA 1 ATA Clock Time (24 hr) 08:27 08:39 - - - - - - 10:09 10:19 Treatment Length: 112 (minutes) Treatment Segments: 4 Capillary Blood Glucose Pre Capillary Blood Glucose (mg/dl): Post Capillary Blood Glucose (mg/dl): Vital Signs Capillary Blood Glucose Reference Range: 80 - 120 mg / dl HBO Diabetic Blood Glucose Intervention Range: <131 mg/dl or >249 mg/dl Time Vitals Blood Respiratory Capillary Blood Glucose Pulse Action Type: Pulse: Temperature: Taken: Pressure: Rate: Glucose (mg/dl): Meter #: Oximetry (%) Taken: Pre 07:51 150/68 90 18 98.3 125 1 none Pre 08:19 129 1 none Post 10:23 136/72 72 18 97.8 102 1 none Treatment Response Treatment Completion Status: Treatment Completed without Adverse Event NAFTALI, CARCHI ([id number]) HBO Attestation I certify that I  supervised this HBO treatment in accordance with Medicare guidelines. A trained Yes emergency response team is readily available per hospital policies and procedures. Continue HBOT as ordered. Yes Electronic Signature(s) Signed: 02/16/2015 11:12:07 AM By: Christin Fudge MD, FACS Entered By: Christin Fudge on 02/16/2015 11:12:06 HAYDN, HUTSELL ([id number]) -------------------------------------------------------------------------------- HBO Safety Checklist Details Patient Name: [name] Date of Service: 02/16/2015 8:00 AM Medical Record Number: [id number] Patient Account Number: [id number] Date of Birth/Sex: [birth year] (75 y.o. Male) Treating RN: Primary Care Physician: POLITE, RONALD Other Clinician: Jacqulyn Bath Referring Physician: POLITE, RONALD Treating Physician/Extender: Frann Rider in Treatment: 2 HBO Safety Checklist Items Safety Checklist Consent Form Signed Patient voided / foley secured and emptied When did you last eato 06:30 am Last dose of injectable or oral agent 06:00 am NA Ostomy pouch emptied and vented if applicable NA All implantable devices assessed, documented and approved NA Intravenous access site secured and place Valuables secured Linens and cotton and cotton/polyester blend (less than 51% polyester) Personal oil-based products / skin lotions / body lotions removed Wigs or hairpieces removed Smoking or tobacco materials removed Books / newspapers / magazines / loose paper removed Cologne, aftershave, perfume and deodorant removed Jewelry removed (may wrap wedding band) Make-up removed Hair care products removed Battery operated devices (external) removed Heating patches and chemical warmers removed NA Titanium eyewear removed NA Nail polish cured greater than 10 hours NA Casting material cured greater than 10 hours NA Hearing aids removed NA Loose dentures or partials removed NA Prosthetics have been removed Patient  demonstrates correct use of air break device (if applicable) Patient concerns have been addressed Patient grounding bracelet on and cord attached to chamber Specifics for Inpatients (complete in addition to above) Medication sheet sent with patient Intravenous medications needed or due during therapy sent with patient Snyders,  [facility] ([id number]) Drainage tubes (e.g. nasogastric tube or chest tube secured and vented) Endotracheal or Tracheotomy tube secured Cuff deflated of air and inflated with saline Airway suctioned Electronic Signature(s) Signed: 02/16/2015 5:06:57 PM By: Lorine Bears RCP, RRT, CHT Entered By: Becky Sax, Amado Nash on 02/16/2015 08:16:01

## 2015-02-17 NOTE — Progress Notes (Signed)
NENG, ALBEE ([id number]) Visit Report for 02/16/2015 Arrival Information Details Patient Name: PRENTIS, LANGDON. Date of Service: 02/16/2015 8:00 AM Medical Record Number: [id number] Patient Account Number: [id number] Date of Birth/Sex: [birth year] (75 y.o. Male) Treating RN: Primary Care Physician: POLITE, RONALD Other Clinician: Jacqulyn Bath Referring Physician: POLITE, RONALD Treating Physician/Extender: Frann Rider in Treatment: 2 Visit Information History Since Last Visit Added or deleted any medications: No Patient Arrived: Ambulatory Any new allergies or adverse reactions: No Arrival Time: 07:45 Had a fall or experienced change in No Accompanied By: self activities of daily living that may affect Transfer Assistance: None risk of falls: Patient Identification Verified: Yes Signs or symptoms of abuse/neglect since last No Secondary Verification Process Yes visito Completed: Hospitalized since last visit: No Patient Requires Transmission-Based No Pain Present Now: No Precautions: Patient Has Alerts: No Electronic Signature(s) Signed: 02/16/2015 5:06:57 PM By: Lorine Bears RCP, RRT, CHT Entered By: Lorine Bears on 02/16/2015 08:14:31 [name] ([id number]) -------------------------------------------------------------------------------- Encounter Discharge Information Details Patient Name: [name] Date of Service: 02/16/2015 8:00 AM Medical Record Number: [id number] Patient Account Number: [id number] Date of Birth/Sex: April 16, 1940 (75 y.o. Male) Treating RN: Primary Care Physician: POLITE, RONALD Other Clinician: Jacqulyn Bath Referring Physician: POLITE, RONALD Treating Physician/Extender: Frann Rider in Treatment: 2 Encounter Discharge Information Items Discharge Pain Level: 0 Discharge Condition: Stable Ambulatory Status: Ambulatory Discharge Destination: Home Transportation: Private  Auto family Accompanied By: member Schedule Follow-up Appointment: No Medication Reconciliation completed No and provided to Patient/Care Chief Walkup: Clinical Summary of Care: Notes Patient has an HBO treatment scheduled on 02/17/15 at 08:00 am. Electronic Signature(s) Signed: 02/16/2015 5:06:57 PM By: Lorine Bears RCP, RRT, CHT Entered By: Lorine Bears on 02/16/2015 10:36:20 [name] ([id number]) -------------------------------------------------------------------------------- Vitals Details Patient Name: [name] Date of Service: 02/16/2015 8:00 AM Medical Record Number: [id number] Patient Account Number: [id number] Date of Birth/Sex: Aug 08, 1940 (75 y.o. Male) Treating RN: Primary Care Physician: POLITE, RONALD Other Clinician: Jacqulyn Bath Referring Physician: POLITE, RONALD Treating Physician/Extender: Frann Rider in Treatment: 2 Vital Signs Time Taken: 07:51 Temperature (F): 98.3 Height (in): 70 Pulse (bpm): 90 Weight (lbs): 243 Respiratory Rate (breaths/min): 18 Body Mass Index (BMI): 34.9 Blood Pressure (mmHg): 150/68 Capillary Blood Glucose (mg/dl): 125 Reference Range: 80 - 120 mg / dl Electronic Signature(s) Signed: 02/16/2015 5:06:57 PM By: Lorine Bears RCP, RRT, CHT Entered By: Becky Sax, Amado Nash on 02/16/2015 08:15:07

## 2015-02-18 ENCOUNTER — Encounter: Payer: Medicare Other | Admitting: Surgery

## 2015-02-18 DIAGNOSIS — N3041 Irradiation cystitis with hematuria: Secondary | ICD-10-CM | POA: Diagnosis not present

## 2015-02-18 LAB — GLUCOSE, CAPILLARY
GLUCOSE-CAPILLARY: 125 mg/dL — AB (ref 65–99)
GLUCOSE-CAPILLARY: 242 mg/dL — AB (ref 65–99)
GLUCOSE-CAPILLARY: 289 mg/dL — AB (ref 65–99)
GLUCOSE-CAPILLARY: 136 mg/dL — AB (ref 65–99)

## 2015-02-18 NOTE — Progress Notes (Signed)
MAXX, PHAM ([id number]) Visit Report for 02/17/2015 Arrival Information Details Patient Name: [name], [name]. Date of Service: 02/17/2015 8:00 AM Medical Record Number: [id number] Patient Account Number: [id number] Date of Birth/Sex: 26-Mar-1940 (75 y.o. Male) Treating RN: Primary Care Physician: POLITE, RONALD Other Clinician: Jacqulyn Bath Referring Physician: POLITE, RONALD Treating Physician/Extender: Frann Rider in Treatment: 3 Visit Information History Since Last Visit Added or deleted any medications: No Patient Arrived: Ambulatory Any new allergies or adverse reactions: No Arrival Time: 07:50 Had a fall or experienced change in No Accompanied By: family activities of daily living that may affect member risk of falls: Transfer Assistance: None Signs or symptoms of abuse/neglect since last No Patient Identification Verified: Yes visito Secondary Verification Process Yes Hospitalized since last visit: No Completed: Pain Present Now: No Patient Requires Transmission- No Based Precautions: Patient Has Alerts: No Electronic Signature(s) Signed: 02/17/2015 3:43:15 PM By: Lorine Bears RCP, RRT, CHT Entered By: Lorine Bears on 02/17/2015 08:06:05 [name] ([id number]) -------------------------------------------------------------------------------- Encounter Discharge Information Details Patient Name: [name] Date of Service: 02/17/2015 8:00 AM Medical Record Number: [id number] Patient Account Number: [id number] Date of Birth/Sex: [birth year] (75 y.o. Male) Treating RN: Primary Care Physician: POLITE, RONALD Other Clinician: Jacqulyn Bath Referring Physician: POLITE, RONALD Treating Physician/Extender: Frann Rider in Treatment: 3 Encounter Discharge Information Items Discharge Pain Level: 0 Discharge Condition: Stable Ambulatory Status: Ambulatory Discharge Destination:  Home Transportation: Private Auto family Accompanied By: member Schedule Follow-up Appointment: No Medication Reconciliation completed No and provided to Patient/Care Bernadett Milian: Clinical Summary of Care: Notes Patient has an HBO treatment scheduled on 02/18/15 at 08:00 am. Electronic Signature(s) Signed: 02/17/2015 3:43:15 PM By: Lorine Bears RCP, RRT, CHT Entered By: Lorine Bears on 02/17/2015 10:24:20 [name] ([id number]) -------------------------------------------------------------------------------- Vitals Details Patient Name: [name] Date of Service: 02/17/2015 8:00 AM Medical Record Number: [id number] Patient Account Number: [id number] Date of Birth/Sex: Feb 24, 1940 (75 y.o. Male) Treating RN: Primary Care Physician: POLITE, RONALD Other Clinician: Jacqulyn Bath Referring Physician: POLITE, RONALD Treating Physician/Extender: Frann Rider in Treatment: 3 Vital Signs Time Taken: 07:53 Temperature (F): 98.3 Height (in): 70 Pulse (bpm): 96 Weight (lbs): 243 Respiratory Rate (breaths/min): 18 Body Mass Index (BMI): 34.9 Blood Pressure (mmHg): 150/80 Capillary Blood Glucose (mg/dl): 242 Reference Range: 80 - 120 mg / dl Electronic Signature(s) Signed: 02/17/2015 3:43:15 PM By: Lorine Bears RCP, RRT, CHT Entered By: Becky Sax, Amado Nash on 02/17/2015 08:06:45

## 2015-02-18 NOTE — Progress Notes (Signed)
FABRICE, DYAL ([id number]) Visit Report for 02/17/2015 HBO Details Patient Name: [name], [name]. Date of Service: 02/17/2015 8:00 AM Medical Record Number: [id number] Patient Account Number: [id number] Date of Birth/Sex: 10-10-1940 (75 y.o. 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 HBO Treatment Details Treatment Number: 10 Patient Type: Outpatient Chamber Type: Monoplace Chamber #: [id number] Treatment Protocol: 2.0 ATA with 90 minutes oxygen, and no air breaks Treatment Details Compression Rate Down: 1.5 psi / minute De-Compression Rate Up: 1.5 psi / minute Air breaks and breathing Compress Tx Pressure Decompress Decompress periods Begins Reached Begins Ends (leave unused spaces blank) Chamber Pressure 1 ATA 2.0 ATA - - - - - - 2.0 ATA 1 ATA Clock Time (24 hr) 08:05 08:17 - - - - - - 09:47 09:57 Treatment Length: 112 (minutes) Treatment Segments: 4 Capillary Blood Glucose Pre Capillary Blood Glucose (mg/dl): Post Capillary Blood Glucose (mg/dl): Vital Signs Capillary Blood Glucose Reference Range: 80 - 120 mg / dl HBO Diabetic Blood Glucose Intervention Range: <131 mg/dl or >249 mg/dl Time Vitals Blood Respiratory Capillary Blood Glucose Pulse Action Type: Pulse: Temperature: Taken: Pressure: Rate: Glucose (mg/dl): Meter #: Oximetry (%) Taken: Pre 07:53 150/80 96 18 98.3 242 1 none Post 10:10 150/80 120 1 none Treatment Response Treatment Completion Status: Treatment Completed without Adverse Event [name], [name] ([id number]) HBO Attestation I certify that I supervised this HBO treatment  in accordance with Medicare guidelines. A trained Yes emergency response team is readily available per hospital policies and procedures. Continue HBOT as ordered. Yes Electronic Signature(s) Signed: 02/17/2015 10:36:37 AM By: Christin Fudge MD, FACS Entered By: Christin Fudge on 02/17/2015 10:36:37 [name] ([id number]) -------------------------------------------------------------------------------- HBO Safety Checklist Details Patient Name: [name] Date of Service: 02/17/2015 8:00 AM Medical Record Number: [id number] Patient Account Number: [id number] Date of Birth/Sex: 29-Apr-1940 (75 y.o. Male) Treating RN: Primary Care Physician: POLITE, RONALD Other Clinician: Jacqulyn Bath Referring Physician: POLITE, RONALD Treating Physician/Extender: Frann Rider in Treatment: 3 HBO Safety Checklist Items Safety Checklist Consent Form Signed Patient voided / foley secured and emptied When did you last eato 06:00 am Last dose of injectable or oral agent 06:00 am NA Ostomy pouch emptied and vented if applicable NA All implantable devices assessed, documented and approved NA Intravenous access site secured and place Valuables secured Linens and cotton and cotton/polyester blend (less than 51% polyester) Personal oil-based products / skin lotions / body lotions removed Wigs or hairpieces removed Smoking or tobacco materials removed Books / newspapers / magazines / loose paper removed Cologne, aftershave, perfume and deodorant removed Jewelry removed (may wrap wedding band) Make-up removed Hair care products removed Battery operated devices (external) removed Heating patches and chemical warmers removed NA Titanium eyewear removed NA Nail polish cured greater than 10 hours NA Casting material cured greater than 10 hours NA Hearing aids removed NA Loose dentures or partials removed NA Prosthetics have been removed Patient demonstrates correct use of air  break device (if applicable) Patient concerns have been addressed Patient grounding bracelet on and cord attached to chamber Specifics for Inpatients (complete in addition to above) Medication sheet sent with patient Intravenous medications needed or due during therapy sent with patient [name], [name] ([id number]) Drainage tubes (e.g. nasogastric tube  or chest tube secured and vented) Endotracheal or Tracheotomy tube secured Cuff deflated of air and inflated with saline Airway suctioned Electronic Signature(s) Signed: 02/17/2015 3:43:15 PM By: Lorine Bears RCP, RRT, CHT Entered By: Lorine Bears on 02/17/2015 08:14:36

## 2015-02-19 ENCOUNTER — Encounter: Payer: Medicare Other | Admitting: Surgery

## 2015-02-19 DIAGNOSIS — N3041 Irradiation cystitis with hematuria: Secondary | ICD-10-CM | POA: Diagnosis not present

## 2015-02-19 LAB — GLUCOSE, CAPILLARY
GLUCOSE-CAPILLARY: 106 mg/dL — AB (ref 65–99)
Glucose-Capillary: 161 mg/dL — ABNORMAL HIGH (ref 65–99)

## 2015-02-19 NOTE — Progress Notes (Signed)
CRISTIAN, DAVITT ([id number]) Visit Report for 02/18/2015 Arrival Information Details KHIEM, GARGIS Date of Service: 02/18/2015 8:00 AM Patient Name: H. Patient Account Number: [id number] Medical Record Treating RN: [id number] Number: Other Clinician: Jacqulyn Bath Date of Birth/Sex: 1940-10-21 (75 y.o. Male) Treating BURNS III, Primary Care Physician: POLITE, RONALD Physician/Extender: Thayer Jew Referring Physician: POLITE, RONALD Weeks in Treatment: 3 Visit Information History Since Last Visit Added or deleted any medications: No Patient Arrived: Ambulatory Any new allergies or adverse reactions: No Arrival Time: 07:45 Had a fall or experienced change in No Accompanied By: family activities of daily living that may affect memeber risk of falls: Transfer Assistance: None Signs or symptoms of abuse/neglect since last No Patient Identification Verified: Yes visito Secondary Verification Process Yes Hospitalized since last visit: No Completed: Pain Present Now: No Patient Requires Transmission- No Based Precautions: Patient Has Alerts: No Electronic Signature(s) Signed: 02/18/2015 4:09:49 PM By: Lorine Bears RCP, RRT, CHT Entered By: Becky Sax, Amado Nash on 02/18/2015 08:07:38 [name] ([id number]) -------------------------------------------------------------------------------- Encounter Discharge Information Details Marvel Plan Date of Service: 02/18/2015 8:00 AM Patient Name: H. Patient Account Number: [id number] Medical Record Treating RN: [id number] Number: Other Clinician: Jacqulyn Bath Date of Birth/Sex: October 17, 1940 (75 y.o. Male) Treating BURNS III, Primary Care Physician: POLITE, RONALD Physician/Extender: Thayer Jew Referring Physician: POLITE, RONALD Weeks in Treatment: 3 Encounter Discharge Information Items Discharge Pain Level: 0 Discharge Condition: Stable Ambulatory Status: Ambulatory Discharge Destination:  Home Transportation: Private Auto family Accompanied By: member Schedule Follow-up Appointment: No Medication Reconciliation completed No and provided to Patient/Care Keiandre Cygan: Clinical Summary of Care: Notes Patient has an HBO treatment scheduled on 02/19/15 at 08:00 am. Electronic Signature(s) Signed: 02/18/2015 4:09:49 PM By: Lorine Bears RCP, RRT, CHT Entered By: Becky Sax, Amado Nash on 02/18/2015 10:39:08 DEJAUN, VIDRIO ([id number]) -------------------------------------------------------------------------------- Vitals Details Marvel Plan Date of Service: 02/18/2015 8:00 AM Patient Name: H. Patient Account Number: [id number] Medical Record Treating RN: [id number] Number: Other Clinician: Jacqulyn Bath Date of Birth/Sex: Nov 22, 1940 (75 y.o. Male) Treating BURNS III, Primary Care Physician: POLITE, RONALD Physician/Extender: Thayer Jew Referring Physician: POLITE, RONALD Weeks in Treatment: 3 Vital Signs Time Taken: 07:50 Temperature (F): 98.7 Height (in): 70 Pulse (bpm): 72 Weight (lbs): 243 Respiratory Rate (breaths/min): 18 Body Mass Index (BMI): 34.9 Blood Pressure (mmHg): 152/66 Capillary Blood Glucose (mg/dl): 289 Reference Range: 80 - 120 mg / dl Electronic Signature(s) Signed: 02/18/2015 4:09:49 PM By: Lorine Bears RCP, RRT, CHT Entered By: Becky Sax, Amado Nash on 02/18/2015 08:08:02

## 2015-02-19 NOTE — Progress Notes (Signed)
[name], [name] ([id number]) Visit Report for 02/18/2015 HBO Details Marvel Plan Date of Service: 02/18/2015 8:00 AM Patient Name: H. Patient Account Number: [id number] Medical Record Treating RN: [id number] Number: Other Clinician: Jacqulyn Bath Date of Birth/Sex: September 09, 1940 (75 y.o. 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 HBO Treatment Details Treatment Number: 11 Patient Type: Outpatient Chamber Type: Monoplace Chamber #: [id number] Treatment Protocol: 2.0 ATA with 90 minutes oxygen, and no air breaks Treatment Details Compression Rate Down: 1.5 psi / minute De-Compression Rate Up: 1.5 psi / minute Air breaks and breathing Compress Tx Pressure Decompress Decompress periods Begins Reached Begins Ends (leave unused spaces blank) Chamber Pressure 1 ATA 2.0 ATA - - - - - - 2.0 ATA 1 ATA Clock Time (24 hr) 08:03 08:15 - - - - - - 09:46 09:56 Treatment Length: 113 (minutes) Treatment Segments: 4 Capillary Blood Glucose Pre Capillary Blood Glucose (mg/dl): Post Capillary Blood Glucose (mg/dl): Vital Signs Capillary Blood Glucose Reference Range: 80 - 120 mg / dl HBO Diabetic Blood Glucose Intervention Range: <131 mg/dl or >249 mg/dl Time Vitals Blood Respiratory Capillary Blood Glucose Pulse Action Type: Pulse: Temperature: Taken: Pressure: Rate: Glucose (mg/dl): Meter #: Oximetry (%) Taken: Pre 07:50 152/66 72 18 98.7 289 1 none Post 10:12 132/80 78 18 98.9 136 1 none Treatment Response [name], [name] ([id number]) Treatment Completion Status: Treatment Completed without Adverse Event Electronic Signature(s) Signed: 02/18/2015 11:51:43  AM By: Loletha Grayer MD Signed: 02/18/2015 4:09:49 PM By: Becky Sax, Sallie RCP, RRT, CHT Entered By: Becky Sax, Amado Nash on 02/18/2015 10:37:06 [name] ([id number]) -------------------------------------------------------------------------------- HBO Safety Checklist Details Marvel Plan Date of Service: 02/18/2015 8:00 AM Patient Name: H. Patient Account Number: [id number] Medical Record Treating RN: [id number] Number: Other Clinician: Jacqulyn Bath Date of Birth/Sex: 1941/01/09 (75 y.o. Male) Treating BURNS III, Primary Care Physician: POLITE, RONALD Physician/Extender: Thayer Jew Referring Physician: POLITE, RONALD Weeks in Treatment: 3 HBO Safety Checklist Items Safety Checklist Consent Form Signed Patient voided / foley secured and emptied When did you last eato 06:30 am Last dose of injectable or oral agent 06:30 am NA Ostomy pouch emptied and vented if applicable NA All implantable devices assessed, documented and approved NA Intravenous access site secured and place Valuables secured Linens and cotton and cotton/polyester blend (less than 51% polyester) Personal oil-based products / skin lotions / body lotions removed Wigs or hairpieces removed Smoking or tobacco materials removed Books / newspapers / magazines / loose paper removed Cologne, aftershave, perfume and deodorant removed Jewelry removed (may wrap wedding band) Make-up removed Hair care products removed Battery operated devices (external) removed Heating patches and chemical warmers removed NA Titanium eyewear removed NA Nail polish cured greater than 10 hours NA Casting material cured greater than 10 hours NA Hearing aids removed NA Loose dentures or partials removed NA Prosthetics have been removed Patient demonstrates correct use of air break device (if applicable) Patient concerns have been addressed Patient grounding bracelet on and cord attached to  chamber Specifics for Inpatients (complete in addition to above) Medication sheet sent with patient [name], [name] ([id number]) Intravenous medications needed or due during therapy sent with patient Drainage tubes (e.g. nasogastric tube or chest tube secured and vented) Endotracheal or Tracheotomy tube secured Cuff deflated of air and inflated  with saline Airway suctioned Electronic Signature(s) Signed: 02/18/2015 4:09:49 PM By: Lorine Bears RCP, RRT, CHT Entered By: Becky Sax, Amado Nash on 02/18/2015 08:08:41

## 2015-02-20 ENCOUNTER — Encounter: Payer: Medicare Other | Admitting: Surgery

## 2015-02-20 DIAGNOSIS — N3041 Irradiation cystitis with hematuria: Secondary | ICD-10-CM | POA: Diagnosis not present

## 2015-02-20 LAB — GLUCOSE, CAPILLARY
GLUCOSE-CAPILLARY: 121 mg/dL — AB (ref 65–99)
GLUCOSE-CAPILLARY: 116 mg/dL — AB (ref 65–99)
Glucose-Capillary: 134 mg/dL — ABNORMAL HIGH (ref 65–99)

## 2015-02-20 NOTE — Progress Notes (Signed)
KEDAR, SEDANO ([id number]) Visit Report for 02/19/2015 HBO Details Patient Name: [name], [name]. Date of Service: 02/19/2015 8:00 AM Medical Record Number: [id number] Patient Account Number: [id number] Date of Birth/Sex: 1940-11-25 (75 y.o. 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 HBO Treatment Details Treatment Number: 12 Patient Type: Outpatient Chamber Type: Monoplace Chamber #: [id number] Treatment Protocol: 2.0 ATA with 90 minutes oxygen, and no air breaks Treatment Details Compression Rate Down: 1.5 psi / minute De-Compression Rate Up: 1.5 psi / minute Air breaks and breathing Compress Tx Pressure Decompress Decompress periods Begins Reached Begins Ends (leave unused spaces blank) Chamber Pressure 1 ATA 2.0 ATA - - - - - - 2.0 ATA 1 ATA Clock Time (24 hr) 08:11 08:23 - - - - - - 09:53 10:04 Treatment Length: 113 (minutes) Treatment Segments: 4 Capillary Blood Glucose Pre Capillary Blood Glucose (mg/dl): Post Capillary Blood Glucose (mg/dl): Vital Signs Capillary Blood Glucose Reference Range: 80 - 120 mg / dl HBO Diabetic Blood Glucose Intervention Range: <131 mg/dl or >249 mg/dl Time Vitals Blood Respiratory Capillary Blood Glucose Pulse Action Type: Pulse: Temperature: Taken: Pressure: Rate: Glucose (mg/dl): Meter #: Oximetry (%) Taken: Pre 08:56 144/76 84 18 98.4 161 1 none Post 10:08 110/72 72 18 98.7 106 1 none Treatment Response Treatment Completion Status: Treatment Completed without Adverse Event [name], [name] ([id number]) Electronic Signature(s) Signed: 02/19/2015 3:37:06 PM  By: Lorine Bears RCP, RRT, CHT Signed: 02/19/2015 4:22:03 PM By: Christin Fudge MD, FACS Previous Signature: 02/19/2015 10:29:30 AM Version By: Christin Fudge MD, FACS Entered By: Lorine Bears on 02/19/2015 10:30:19 [name], [name] ([id number]) -------------------------------------------------------------------------------- HBO Safety Checklist Details Patient Name: [name], [name]. Date of Service: 02/19/2015 8:00 AM Medical Record Number: [id number] Patient Account Number: [id number] Date of Birth/Sex: Jun 02, 1940 (75 y.o. Male) Treating RN: Primary Care Physician: POLITE, RONALD Other Clinician: Jacqulyn Bath Referring Physician: POLITE, RONALD Treating Physician/Extender: Frann Rider in Treatment: 3 HBO Safety Checklist Items Safety Checklist Consent Form Signed Patient voided / foley secured and emptied When did you last eato 6:30 am Last dose of injectable or oral agent 06:30 am NA Ostomy pouch emptied and vented if applicable NA All implantable devices assessed, documented and approved NA Intravenous access site secured and place Valuables secured Linens and cotton and cotton/polyester blend (less than 51% polyester) Personal oil-based products / skin lotions / body lotions removed Wigs or hairpieces removed Smoking or tobacco materials removed Books / newspapers / magazines / loose paper removed Cologne, aftershave, perfume and deodorant removed Jewelry removed (may wrap wedding band) Make-up removed Hair care products removed Battery operated devices (external) removed Heating patches and chemical warmers removed NA Titanium eyewear removed NA Nail polish cured greater than 10 hours NA Casting material cured greater than 10 hours NA Hearing aids removed NA Loose dentures or partials removed NA Prosthetics have been removed Patient demonstrates correct use of air break device (if applicable) Patient concerns have been  addressed Patient grounding bracelet on and cord attached to chamber Specifics for Inpatients (complete in addition to above) Medication sheet sent with patient Intravenous medications needed or due during therapy sent with patient [name], [name] ([id number]) Drainage tubes (e.g. nasogastric tube or chest tube secured and vented) Endotracheal or  Tracheotomy tube secured Cuff deflated of air and inflated with saline Airway suctioned Electronic Signature(s) Signed: 02/19/2015 3:37:06 PM By: Lorine Bears RCP, RRT, CHT Entered By: Becky Sax, Amado Nash on 02/19/2015 08:25:03

## 2015-02-20 NOTE — Progress Notes (Signed)
LAZARO, ISENHOWER ([id number]) Visit Report for 02/19/2015 Arrival Information Details Patient Name: [name], [name]. Date of Service: 02/19/2015 8:00 AM Medical Record Number: [id number] Patient Account Number: [id number] Date of Birth/Sex: [birth year] (75 y.o. Male) Treating RN: Primary Care Physician: POLITE, RONALD Other Clinician: Jacqulyn Bath Referring Physician: POLITE, RONALD Treating Physician/Extender: Frann Rider in Treatment: 3 Visit Information History Since Last Visit Added or deleted any medications: No Patient Arrived: Ambulatory Any new allergies or adverse reactions: No Arrival Time: 07:50 Had a fall or experienced change in No Accompanied By: family activities of daily living that may affect member risk of falls: Transfer Assistance: None Signs or symptoms of abuse/neglect since last No Patient Identification Verified: Yes visito Secondary Verification Process Yes Hospitalized since last visit: No Completed: Pain Present Now: No Patient Requires Transmission- No Based Precautions: Patient Has Alerts: No Electronic Signature(s) Signed: 02/19/2015 3:37:06 PM By: Lorine Bears RCP, RRT, CHT Entered By: Lorine Bears on 02/19/2015 08:23:01 [name] ([id number]) -------------------------------------------------------------------------------- Encounter Discharge Information Details Patient Name: [name] Date of Service: 02/19/2015 8:00 AM Medical Record Number: [id number] Patient Account Number: [id number] Date of Birth/Sex: [birth year] (75 y.o. Male) Treating RN: Primary Care Physician: POLITE, RONALD Other Clinician: Jacqulyn Bath Referring Physician: POLITE, RONALD Treating Physician/Extender: Frann Rider in Treatment: 3 Encounter Discharge Information Items Discharge Pain Level: 0 Discharge Condition: Stable Ambulatory Status: Ambulatory Discharge Destination:  Home Transportation: Private Auto family Accompanied By: member Schedule Follow-up Appointment: No Medication Reconciliation completed No and provided to Patient/Care Josclyn Rosales: Clinical Summary of Care: Notes Patient has an HBO treatment scheduled on 02/20/15 @ 08:00 am. Electronic Signature(s) Signed: 02/19/2015 3:37:06 PM By: Lorine Bears RCP, RRT, CHT Entered By: Lorine Bears on 02/19/2015 10:31:20 [name] ([id number]) -------------------------------------------------------------------------------- Vitals Details Patient Name: [name] Date of Service: 02/19/2015 8:00 AM Medical Record Number: [id number] Patient Account Number: [id number] Date of Birth/Sex: [birth year] (75 y.o. Male) Treating RN: Primary Care Physician: POLITE, RONALD Other Clinician: Jacqulyn Bath Referring Physician: POLITE, RONALD Treating Physician/Extender: Frann Rider in Treatment: 3 Vital Signs Time Taken: 08:56 Temperature (F): 98.4 Height (in): 70 Pulse (bpm): 84 Weight (lbs): 243 Respiratory Rate (breaths/min): 18 Body Mass Index (BMI): 34.9 Blood Pressure (mmHg): 144/76 Capillary Blood Glucose (mg/dl): 161 Reference Range: 80 - 120 mg / dl Electronic Signature(s) Signed: 02/19/2015 3:37:06 PM By: Lorine Bears RCP, RRT, CHT Entered By: Becky Sax, Amado Nash on 02/19/2015 08:24:07

## 2015-02-21 NOTE — Progress Notes (Signed)
WAYLAND, BAIK ([id number]) Visit Report for 02/20/2015 Arrival Information Details Patient Name: [name], [name]. Date of Service: 02/20/2015 8:00 AM Medical Record Number: [id number] Patient Account Number: [id number] Date of Birth/Sex: 1940/07/12 (75 y.o. Male) Treating RN: Primary Care Physician: POLITE, RONALD Other Clinician: Jacqulyn Bath Referring Physician: POLITE, RONALD Treating Physician/Extender: Frann Rider in Treatment: 3 Visit Information History Since Last Visit Added or deleted any medications: No Patient Arrived: Ambulatory Any new allergies or adverse reactions: No Arrival Time: 07:55 Had a fall or experienced change in No Accompanied By: family activities of daily living that may affect member risk of falls: Transfer Assistance: None Signs or symptoms of abuse/neglect since last No Patient Identification Verified: Yes visito Secondary Verification Process Yes Hospitalized since last visit: No Completed: Pain Present Now: No Patient Requires Transmission- No Based Precautions: Patient Has Alerts: No Electronic Signature(s) Signed: 02/20/2015 3:26:26 PM By: Lorine Bears RCP, RRT, CHT Entered By: Lorine Bears on 02/20/2015 08:35:17 [name] ([id number]) -------------------------------------------------------------------------------- Encounter Discharge Information Details Patient Name: [name] Date of Service: 02/20/2015 8:00 AM Medical Record Number: [id number] Patient Account Number: [id number] Date of Birth/Sex: [birth year] (75 y.o. Male) Treating RN: Primary Care Physician: POLITE, RONALD Other Clinician: Jacqulyn Bath Referring Physician: POLITE, RONALD Treating Physician/Extender: Frann Rider in Treatment: 3 Encounter Discharge Information Items Discharge Pain Level: 0 Discharge Condition: Stable Ambulatory Status: Ambulatory Discharge Destination:  Home Transportation: Private Auto family Accompanied By: member Schedule Follow-up Appointment: No Medication Reconciliation completed No and provided to Patient/Care Tarig Zimmers: Clinical Summary of Care: Notes Patient has an HBO treatment scheduled on 02/23/15 at 08:00 am. Electronic Signature(s) Signed: 02/20/2015 3:26:26 PM By: Lorine Bears RCP, RRT, CHT Entered By: Lorine Bears on 02/20/2015 10:52:31 [name] ([id number]) -------------------------------------------------------------------------------- Vitals Details Patient Name: [name] Date of Service: 02/20/2015 8:00 AM Medical Record Number: [id number] Patient Account Number: [id number] Date of Birth/Sex: 06-Dec-1940 (75 y.o. Male) Treating RN: Primary Care Physician: POLITE, RONALD Other Clinician: Jacqulyn Bath Referring Physician: POLITE, RONALD Treating Physician/Extender: Frann Rider in Treatment: 3 Vital Signs Time Taken: 08:05 Temperature (F): 97.8 Height (in): 70 Pulse (bpm): 78 Weight (lbs): 243 Respiratory Rate (breaths/min): 18 Body Mass Index (BMI): 34.9 Blood Pressure (mmHg): 122/58 Capillary Blood Glucose (mg/dl): 121 Reference Range: 80 - 120 mg / dl Electronic Signature(s) Signed: 02/20/2015 3:26:26 PM By: Lorine Bears RCP, RRT, CHT Entered By: Lorine Bears on 02/20/2015 08:35:54

## 2015-02-21 NOTE — Progress Notes (Signed)
KALEEL, SCHMIEDER ([id number]) Visit Report for 02/20/2015 HBO Details Patient Name: [name], PALUCH. Date of Service: 02/20/2015 8:00 AM Medical Record Number: [id number] Patient Account Number: [id number] Date of Birth/Sex: Jan 17, 1941 (75 y.o. 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 HBO Treatment Details Treatment Number: 13 Patient Type: Outpatient Chamber Type: Monoplace Chamber #: [id number] Treatment Protocol: 2.0 ATA with 90 minutes oxygen, and no air breaks Treatment Details Compression Rate Down: 1.5 psi / minute De-Compression Rate Up: 1.5 psi / minute Air breaks and breathing Compress Tx Pressure Decompress Decompress periods Begins Reached Begins Ends (leave unused spaces blank) Chamber Pressure 1 ATA 2.0 ATA - - - - - - 2.0 ATA 1 ATA Clock Time (24 hr) 08:44 08:56 - - - - - - 10:26 10:36 Treatment Length: 112 (minutes) Treatment Segments: 4 Capillary Blood Glucose Pre Capillary Blood Glucose (mg/dl): Post Capillary Blood Glucose (mg/dl): Vital Signs Capillary Blood Glucose Reference Range: 80 - 120 mg / dl HBO Diabetic Blood Glucose Intervention Range: <131 mg/dl or >249 mg/dl Time Vitals Blood Respiratory Capillary Blood Glucose Pulse Action Type: Pulse: Temperature: Taken: Pressure: Rate: Glucose (mg/dl): Meter #: Oximetry (%) Taken: Pre 08:05 122/58 78 18 97.8 121 1 gave Ensure Pre 08:42 134 1 proceed with treatment per protocol Post 10:40 130/68 84 18 97.8 116 1 none Treatment Response Treatment Completion Status: Treatment Completed without Adverse Event CABE, LASHLEY  ([id number]) Electronic Signature(s) Signed: 02/20/2015 3:26:26 PM By: Lorine Bears RCP, RRT, CHT Signed: 02/20/2015 4:37:54 PM By: Christin Fudge MD, FACS Previous Signature: 02/20/2015 10:40:14 AM Version By: Christin Fudge MD, FACS Entered By: Lorine Bears on 02/20/2015 10:51:41 PAYTEN, BEAUMIER ([id number]) -------------------------------------------------------------------------------- HBO Safety Checklist Details Patient Name: [name]. Date of Service: 02/20/2015 8:00 AM Medical Record Number: [id number] Patient Account Number: [id number] Date of Birth/Sex: 03-Apr-1940 (75 y.o. Male) Treating RN: Primary Care Physician: POLITE, RONALD Other Clinician: Jacqulyn Bath Referring Physician: POLITE, RONALD Treating Physician/Extender: Frann Rider in Treatment: 3 HBO Safety Checklist Items Safety Checklist Consent Form Signed Patient voided / foley secured and emptied When did you last eato 07:45 am Last dose of injectable or oral agent 06:30 am NA Ostomy pouch emptied and vented if applicable NA All implantable devices assessed, documented and approved NA Intravenous access site secured and place Valuables secured Linens and cotton and cotton/polyester blend (less than 51% polyester) Personal oil-based products / skin lotions / body lotions removed Wigs or hairpieces removed Smoking or tobacco materials removed Books / newspapers / magazines / loose paper removed Cologne, aftershave, perfume and deodorant removed Jewelry removed (may wrap wedding band) Make-up removed Hair care products removed Battery operated devices (external) removed Heating patches and chemical warmers removed NA Titanium eyewear removed NA Nail polish cured greater than 10 hours NA Casting material cured greater than 10 hours NA Hearing aids removed NA Loose dentures or partials removed NA Prosthetics have been removed Patient demonstrates correct use  of air break device (if applicable) Patient concerns have been addressed Patient grounding bracelet on and cord attached to chamber Specifics for Inpatients (complete in addition to above) Medication sheet sent with patient Intravenous medications needed or due during therapy sent with patient SHON, INDELICATO ([id number]) Drainage tubes (e.g.  nasogastric tube or chest tube secured and vented) Endotracheal or Tracheotomy tube secured Cuff deflated of air and inflated with saline Airway suctioned Electronic Signature(s) Signed: 02/20/2015 3:26:26 PM By: Lorine Bears RCP, RRT, CHT Entered By: Becky Sax, Amado Nash on 02/20/2015 08:36:42

## 2015-02-23 ENCOUNTER — Encounter: Payer: Medicare Other | Admitting: Surgery

## 2015-02-23 DIAGNOSIS — N3041 Irradiation cystitis with hematuria: Secondary | ICD-10-CM | POA: Diagnosis not present

## 2015-02-24 ENCOUNTER — Encounter: Payer: Medicare Other | Admitting: Surgery

## 2015-02-24 DIAGNOSIS — N3041 Irradiation cystitis with hematuria: Secondary | ICD-10-CM | POA: Diagnosis not present

## 2015-02-24 LAB — GLUCOSE, CAPILLARY: GLUCOSE-CAPILLARY: 142 mg/dL — AB (ref 65–99)

## 2015-02-24 NOTE — Progress Notes (Signed)
[name] ([id number]) Visit Report for 02/24/2015 Arrival Information Details Patient Name: [name], [name]. Date of Service: 02/24/2015 8:00 AM Medical Record Number: [id number] Patient Account Number: [id number] Date of Birth/Sex: [birth year] (75 y.o. Male) Treating RN: Primary Care Physician: POLITE, RONALD Other Clinician: Jacqulyn Bath Referring Physician: POLITE, RONALD Treating Physician/Extender: Frann Rider in Treatment: 4 Visit Information History Since Last Visit Added or deleted any medications: No Patient Arrived: Ambulatory Any new allergies or adverse reactions: No Arrival Time: 07:50 Had a fall or experienced change in No Accompanied By: family activities of daily living that may affect member risk of falls: Transfer Assistance: None Signs or symptoms of abuse/neglect since last No Patient Identification Verified: Yes visito Secondary Verification Process Yes Hospitalized since last visit: No Completed: Pain Present Now: No Patient Requires Transmission- No Based Precautions: Patient Has Alerts: No Electronic Signature(s) Signed: 02/24/2015 2:46:52 PM By: Lorine Bears RCP, RRT, CHT Entered By: Lorine Bears on 02/24/2015 08:07:09 [name] ([id number]) -------------------------------------------------------------------------------- Encounter Discharge Information Details Patient Name: [name] Date of Service: 02/24/2015 8:00 AM Medical Record Number: [id number] Patient Account Number: [id number] Date of Birth/Sex: 11/09/1940 (75 y.o. Male) Treating RN: Primary Care Physician: POLITE, RONALD Other Clinician: Jacqulyn Bath Referring Physician: POLITE, RONALD Treating Physician/Extender: Frann Rider in Treatment: 4 Encounter Discharge Information Items Discharge Pain Level: 0 Discharge Condition: Stable Ambulatory Status: Ambulatory Discharge Destination:  Home Transportation: Private Auto family Accompanied By: member Schedule Follow-up Appointment: No Medication Reconciliation completed No and provided to Patient/Care Charity Tessier: Clinical Summary of Care: Notes Patient has an HBO treatment scheduled on 02/15/15 at 08:00 am. Electronic Signature(s) Signed: 02/24/2015 2:46:52 PM By: Lorine Bears RCP, RRT, CHT Entered By: Lorine Bears on 02/24/2015 10:18:20 [name] ([id number]) -------------------------------------------------------------------------------- Vitals Details Patient Name: [name] Date of Service: 02/24/2015 8:00 AM Medical Record Number: [id number] Patient Account Number: [id number] Date of Birth/Sex: [birth year] (75 y.o. Male) Treating RN: Primary Care Physician: POLITE, RONALD Other Clinician: Jacqulyn Bath Referring Physician: POLITE, RONALD Treating Physician/Extender: Frann Rider in Treatment: 4 Vital Signs Time Taken: 07:50 Temperature (F): 98.6 Height (in): 70 Pulse (bpm): 84 Weight (lbs): 243 Respiratory Rate (breaths/min): 18 Body Mass Index (BMI): 34.9 Blood Pressure (mmHg): 146/76 Capillary Blood Glucose (mg/dl): 198 Reference Range: 80 - 120 mg / dl Electronic Signature(s) Signed: 02/24/2015 2:46:52 PM By: Lorine Bears RCP, RRT, CHT Entered By: Becky Sax, Amado Nash on 02/24/2015 08:07:49

## 2015-02-24 NOTE — Progress Notes (Signed)
SULLIVAN, JACUINDE ([id number]) Visit Report for 02/23/2015 HBO Details Patient Name: [name], [name]. Date of Service: 02/23/2015 8:00 AM Medical Record Number: [id number] Patient Account Number: [id number] Date of Birth/Sex: 1940-02-19 (75 y.o. 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 HBO Treatment Details Treatment Number: 14 Patient Type: Outpatient Chamber Type: Monoplace Chamber #: [id number] Treatment Protocol: 2.0 ATA with 90 minutes oxygen, and no air breaks Treatment Details Compression Rate Down: 1.5 psi / minute De-Compression Rate Up: 1.5 psi / minute Air breaks and breathing Compress Tx Pressure Decompress Decompress periods Begins Reached Begins Ends (leave unused spaces blank) Chamber Pressure 1 ATA 2.0 ATA - - - - - - 2.0 ATA 1 ATA Clock Time (24 hr) 08:08 08:20 - - - - - - 09:50 10:00 Treatment Length: 112 (minutes) Treatment Segments: 4 Capillary Blood Glucose Pre Capillary Blood Glucose (mg/dl): Post Capillary Blood Glucose (mg/dl): Vital Signs Capillary Blood Glucose Reference Range: 80 - 120 mg / dl HBO Diabetic Blood Glucose Intervention Range: <131 mg/dl or >249 mg/dl Time Vitals Blood Respiratory Capillary Blood Glucose Pulse Action Type: Pulse: Temperature: Taken: Pressure: Rate: Glucose (mg/dl): Meter #: Oximetry (%) Taken: Pre 07:53 144/80 84 18 98.2 164 1 none Post 10:04 140/66 84 18 98.7 91 1 none Treatment Response Treatment Completion Status: Treatment Completed without Adverse Event YAMIR, CARIGNAN ([id number]) Electronic Signature(s) Signed: 02/23/2015 4:13:39 PM By:  Christin Fudge MD, FACS Signed: 02/24/2015 2:46:52 PM By: Lorine Bears RCP, RRT, CHT Previous Signature: 02/23/2015 10:07:39 AM Version By: Christin Fudge MD, FACS Entered By: Lorine Bears on 02/23/2015 10:13:41 ELGIN, CARN ([id number]) -------------------------------------------------------------------------------- HBO Safety Checklist Details Patient Name: ERLE, GUSTER. Date of Service: 02/23/2015 8:00 AM Medical Record Number: [id number] Patient Account Number: [id number] Date of Birth/Sex: 09/18/1940 (75 y.o. Male) Treating RN: Primary Care Physician: POLITE, RONALD Other Clinician: Jacqulyn Bath Referring Physician: POLITE, RONALD Treating Physician/Extender: Frann Rider in Treatment: 3 HBO Safety Checklist Items Safety Checklist Consent Form Signed Patient voided / foley secured and emptied When did you last eato 06:30 am Last dose of injectable or oral agent 06:30 am NA Ostomy pouch emptied and vented if applicable NA All implantable devices assessed, documented and approved NA Intravenous access site secured and place Valuables secured Linens and cotton and cotton/polyester blend (less than 51% polyester) Personal oil-based products / skin lotions / body lotions removed Wigs or hairpieces removed Smoking or tobacco materials removed Books / newspapers / magazines / loose paper removed Cologne, aftershave, perfume and deodorant removed Jewelry removed (may wrap wedding band) Make-up removed Hair care products removed Battery operated devices (external) removed Heating patches and chemical warmers removed NA Titanium eyewear removed NA Nail polish cured greater than 10 hours NA Casting material cured greater than 10 hours NA Hearing aids removed NA Loose dentures or partials removed NA Prosthetics have been removed Patient demonstrates correct use of air break device (if applicable) Patient concerns have been  addressed Patient grounding bracelet on and cord attached to chamber Specifics for Inpatients (complete in addition to above) Medication sheet sent with patient Intravenous medications needed or due during therapy sent with patient LADARIOUS, KRESSE ([id number]) Drainage tubes (e.g. nasogastric tube or chest tube secured and vented) Endotracheal or  Tracheotomy tube secured Cuff deflated of air and inflated with saline Airway suctioned Electronic Signature(s) Signed: 02/24/2015 2:46:52 PM By: Lorine Bears RCP, RRT, CHT Entered By: Becky Sax, Amado Nash on 02/23/2015 08:11:11

## 2015-02-24 NOTE — Progress Notes (Signed)
VANDERBILT, RANIERI ([id number]) Visit Report for 02/23/2015 Arrival Information Details Patient Name: [name], [name]. Date of Service: 02/23/2015 8:00 AM Medical Record Number: [id number] Patient Account Number: [id number] Date of Birth/Sex: 1940-11-04 (75 y.o. Male) Treating RN: Primary Care Physician: POLITE, RONALD Other Clinician: Jacqulyn Bath Referring Physician: POLITE, RONALD Treating Physician/Extender: Frann Rider in Treatment: 3 Visit Information History Since Last Visit Added or deleted any medications: No Patient Arrived: Ambulatory Any new allergies or adverse reactions: No Arrival Time: 07:50 Had a fall or experienced change in No Accompanied By: family activities of daily living that may affect member risk of falls: Transfer Assistance: Stretcher Signs or symptoms of abuse/neglect since last No Patient Identification Verified: Yes visito Secondary Verification Process Yes Hospitalized since last visit: No Completed: Pain Present Now: No Patient Requires Transmission- No Based Precautions: Patient Has Alerts: No Electronic Signature(s) Signed: 02/24/2015 2:46:52 PM By: Lorine Bears RCP, RRT, CHT Entered By: Lorine Bears on 02/23/2015 08:09:44 [name] ([id number]) -------------------------------------------------------------------------------- Encounter Discharge Information Details Patient Name: [name] Date of Service: 02/23/2015 8:00 AM Medical Record Number: [id number] Patient Account Number: [id number] Date of Birth/Sex: 04/04/1940 (75 y.o. Male) Treating RN: Primary Care Physician: POLITE, RONALD Other Clinician: Jacqulyn Bath Referring Physician: POLITE, RONALD Treating Physician/Extender: Frann Rider in Treatment: 3 Encounter Discharge Information Items Discharge Pain Level: 0 Discharge Condition: Stable Ambulatory Status: Ambulatory Discharge Destination:  Home Transportation: Private Auto family Accompanied By: member Schedule Follow-up Appointment: No Medication Reconciliation completed No and provided to Patient/Care Kaleia Longhi: Clinical Summary of Care: Notes Patient has an HBO treatment scheduled on 02/24/15 at 10:00 am. Electronic Signature(s) Signed: 02/24/2015 2:46:52 PM By: Lorine Bears RCP, RRT, CHT Entered By: Lorine Bears on 02/23/2015 10:15:16 [name] ([id number]) -------------------------------------------------------------------------------- Vitals Details Patient Name: [name] Date of Service: 02/23/2015 8:00 AM Medical Record Number: [id number] Patient Account Number: [id number] Date of Birth/Sex: Sep 28, 1940 (75 y.o. Male) Treating RN: Primary Care Physician: POLITE, RONALD Other Clinician: Jacqulyn Bath Referring Physician: POLITE, RONALD Treating Physician/Extender: Frann Rider in Treatment: 3 Vital Signs Time Taken: 07:53 Temperature (F): 98.2 Height (in): 70 Pulse (bpm): 84 Weight (lbs): 243 Respiratory Rate (breaths/min): 18 Body Mass Index (BMI): 34.9 Blood Pressure (mmHg): 144/80 Capillary Blood Glucose (mg/dl): 164 Reference Range: 80 - 120 mg / dl Electronic Signature(s) Signed: 02/24/2015 2:46:52 PM By: Lorine Bears RCP, RRT, CHT Entered By: Lorine Bears on 02/23/2015 08:10:18

## 2015-02-24 NOTE — Progress Notes (Signed)
[name] ([id number]) Visit Report for 02/24/2015 HBO Details Patient Name: [name], [name]. Date of Service: 02/24/2015 8:00 AM Medical Record Number: [id number] Patient Account Number: [id number] Date of Birth/Sex: 1940-09-08 (75 y.o. 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 HBO Treatment Details Treatment Number: 15 Patient Type: Outpatient Chamber Type: Monoplace Chamber #: [id number] Treatment Protocol: 2.0 ATA with 90 minutes oxygen, and no air breaks Treatment Details Compression Rate Down: 1.5 psi / minute De-Compression Rate Up: 1.5 psi / minute Air breaks and breathing Compress Tx Pressure Decompress Decompress periods Begins Reached Begins Ends (leave unused spaces blank) Chamber Pressure 1 ATA 2.0 ATA - - - - - - 2.0 ATA 1 ATA Clock Time (24 hr) 08:05 08:17 - - - - - - 09:47 09:57 Treatment Length: 112 (minutes) Treatment Segments: 4 Capillary Blood Glucose Pre Capillary Blood Glucose (mg/dl): Post Capillary Blood Glucose (mg/dl): Vital Signs Capillary Blood Glucose Reference Range: 80 - 120 mg / dl HBO Diabetic Blood Glucose Intervention Range: <131 mg/dl or >249 mg/dl Time Vitals Blood Respiratory Capillary Blood Glucose Pulse Action Type: Pulse: Temperature: Taken: Pressure: Rate: Glucose (mg/dl): Meter #: Oximetry (%) Taken: Pre 07:50 146/76 84 18 98.6 198 1 none Post 10:12 144/80 72 18 98.6 142 1 none Treatment Response Treatment Completion Status: Treatment Completed without Adverse Event GRAHM, ETSITTY ([id number]) Electronic Signature(s) Signed: 02/24/2015 2:32:13 PM  By: Christin Fudge MD, FACS Signed: 02/24/2015 2:46:52 PM By: Lorine Bears RCP, RRT, CHT Previous Signature: 02/24/2015 10:05:03 AM Version By: Christin Fudge MD, FACS Entered By: Lorine Bears on 02/24/2015 10:17:18 JAHKAI, YANDELL ([id number]) -------------------------------------------------------------------------------- HBO Safety Checklist Details Patient Name: [name]. Date of Service: 02/24/2015 8:00 AM Medical Record Number: [id number] Patient Account Number: [id number] Date of Birth/Sex: 03/25/1940 (75 y.o. Male) Treating RN: Primary Care Physician: POLITE, RONALD Other Clinician: Jacqulyn Bath Referring Physician: POLITE, RONALD Treating Physician/Extender: Frann Rider in Treatment: 4 HBO Safety Checklist Items Safety Checklist Consent Form Signed Patient voided / foley secured and emptied When did you last eato 06:30 am Last dose of injectable or oral agent 06:30 am NA Ostomy pouch emptied and vented if applicable NA All implantable devices assessed, documented and approved NA Intravenous access site secured and place Valuables secured Linens and cotton and cotton/polyester blend (less than 51% polyester) Personal oil-based products / skin lotions / body lotions removed Wigs or hairpieces removed Smoking or tobacco materials removed Books / newspapers / magazines / loose paper removed Cologne, aftershave, perfume and deodorant removed Jewelry removed (may wrap wedding band) Make-up removed Hair care products removed Battery operated devices (external) removed Heating patches and chemical warmers removed NA Titanium eyewear removed NA Nail polish cured greater than 10 hours NA Casting material cured greater than 10 hours NA Hearing aids removed NA Loose dentures or partials removed NA Prosthetics have been removed Patient demonstrates correct use of air break device (if applicable) Patient concerns have been  addressed Patient grounding bracelet on and cord attached to chamber Specifics for Inpatients (complete in addition to above) Medication sheet sent with patient Intravenous medications needed or due during therapy sent with patient ESTIL, VALLEE ([id number]) Drainage tubes (e.g. nasogastric tube or chest tube secured and vented) Endotracheal or  Tracheotomy tube secured Cuff deflated of air and inflated with saline Airway suctioned Electronic Signature(s) Signed: 02/24/2015 2:46:52 PM By: Lorine Bears RCP, RRT, CHT Entered By: Becky Sax, Amado Nash on 02/24/2015 08:08:35

## 2015-02-25 ENCOUNTER — Encounter: Payer: Medicare Other | Admitting: Surgery

## 2015-02-25 DIAGNOSIS — N3041 Irradiation cystitis with hematuria: Secondary | ICD-10-CM | POA: Diagnosis not present

## 2015-02-25 LAB — GLUCOSE, CAPILLARY
Glucose-Capillary: 136 mg/dL — ABNORMAL HIGH (ref 65–99)
Glucose-Capillary: 130 mg/dL — ABNORMAL HIGH (ref 65–99)

## 2015-02-25 NOTE — Progress Notes (Signed)
KENDALE, REMBOLD ([id number]) Visit Report for 02/25/2015 HBO Details Patient Name: [name], [name]. Date of Service: 02/25/2015 8:00 AM Medical Record Number: [id number] Patient Account Number: [id number] Date of Birth/Sex: March 22, 1940 (75 y.o. 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 HBO Treatment Details Treatment Number: 16 Patient Type: Outpatient Chamber Type: Monoplace Chamber #: [id number] Treatment Protocol: 2.0 ATA with 90 minutes oxygen, and no air breaks Treatment Details Compression Rate Down: 1.5 psi / minute De-Compression Rate Up: 1.5 psi / minute Air breaks and breathing Compress Tx Pressure Decompress Decompress periods Begins Reached Begins Ends (leave unused spaces blank) Chamber Pressure 1 ATA 2.0 ATA - - - - - - 2.0 ATA 1 ATA Clock Time (24 hr) 08:06 08:18 - - - - - - 09:48 09:58 Treatment Length: 112 (minutes) Treatment Segments: 4 Capillary Blood Glucose Pre Capillary Blood Glucose (mg/dl): Post Capillary Blood Glucose (mg/dl): Vital Signs Capillary Blood Glucose Reference Range: 80 - 120 mg / dl HBO Diabetic Blood Glucose Intervention Range: <131 mg/dl or >249 mg/dl Time Vitals Blood Respiratory Capillary Blood Glucose Pulse Action Type: Pulse: Temperature: Taken: Pressure: Rate: Glucose (mg/dl): Meter #: Oximetry (%) Taken: Pre 07:51 164/68 78 18 97.9 136 1 none Post 10:16 148/70 90 18 98.3 130 1 none Treatment Response Treatment Completion Status: Treatment Completed without Adverse Event [name], [name] ([id number]) HBO Attestation I certify that I supervised this HBO  treatment in accordance with Medicare guidelines. A trained Yes emergency response team is readily available per hospital policies and procedures. Continue HBOT as ordered. Yes Electronic Signature(s) Signed: 02/25/2015 11:03:58 AM By: Christin Fudge MD, FACS Entered By: Christin Fudge on 02/25/2015 11:03:58 [name], [name] ([id number]) -------------------------------------------------------------------------------- HBO Safety Checklist Details Patient Name: [name] Date of Service: 02/25/2015 8:00 AM Medical Record Number: [id number] Patient Account Number: [id number] Date of Birth/Sex: 05-17-1940 (75 y.o. Male) Treating RN: Primary Care Physician: POLITE, RONALD Other Clinician: Jacqulyn Bath Referring Physician: POLITE, RONALD Treating Physician/Extender: Frann Rider in Treatment: 4 HBO Safety Checklist Items Safety Checklist Consent Form Signed Patient voided / foley secured and emptied When did you last eato 06:30 am Last dose of injectable or oral agent 06:30 am NA Ostomy pouch emptied and vented if applicable NA All implantable devices assessed, documented and approved NA Intravenous access site secured and place Valuables secured Linens and cotton and cotton/polyester blend (less than 51% polyester) Personal oil-based products / skin lotions / body lotions removed Wigs or hairpieces removed Smoking or tobacco materials removed Books / newspapers / magazines / loose paper removed Cologne, aftershave, perfume and deodorant removed Jewelry removed (may wrap wedding band) Make-up removed Hair care products removed Battery operated devices (external) removed Heating patches and chemical warmers removed NA Titanium eyewear removed NA Nail polish cured greater than 10 hours NA Casting material cured greater than 10 hours NA Hearing aids removed NA Loose dentures or partials removed NA Prosthetics have been removed Patient demonstrates correct use  of air break device (if applicable) Patient concerns have been addressed Patient grounding bracelet on and cord attached to chamber Specifics for Inpatients (complete in addition to above) Medication sheet sent with patient Intravenous medications needed or due during therapy sent with patient [name], [name] ([id number]) Drainage tubes (  e.g. nasogastric tube or chest tube secured and vented) Endotracheal or Tracheotomy tube secured Cuff deflated of air and inflated with saline Airway suctioned Electronic Signature(s) Signed: 02/25/2015 2:20:14 PM By: Lorine Bears RCP, RRT, CHT Entered By: Becky Sax, Amado Nash on 02/25/2015 08:08:55

## 2015-02-25 NOTE — Progress Notes (Signed)
MANUELITO, POAGE ([id number]) Visit Report for 02/25/2015 Arrival Information Details Patient Name: [name], [name]. Date of Service: 02/25/2015 8:00 AM Medical Record Number: [id number] Patient Account Number: [id number] Date of Birth/Sex: 12/30/1940 (75 y.o. Male) Treating RN: Primary Care Physician: POLITE, RONALD Other Clinician: Jacqulyn Bath Referring Physician: POLITE, RONALD Treating Physician/Extender: Frann Rider in Treatment: 4 Visit Information History Since Last Visit Added or deleted any medications: No Patient Arrived: Ambulatory Any new allergies or adverse reactions: No Arrival Time: 07:50 Had a fall or experienced change in No Accompanied By: family activities of daily living that may affect member risk of falls: Transfer Assistance: None Signs or symptoms of abuse/neglect since last No Patient Identification Verified: Yes visito Secondary Verification Process Yes Hospitalized since last visit: No Completed: Pain Present Now: No Patient Requires Transmission- No Based Precautions: Patient Has Alerts: No Electronic Signature(s) Signed: 02/25/2015 2:20:14 PM By: Lorine Bears RCP, RRT, CHT Entered By: Lorine Bears on 02/25/2015 08:07:47 [name] ([id number]) -------------------------------------------------------------------------------- Encounter Discharge Information Details Patient Name: [name] Date of Service: 02/25/2015 8:00 AM Medical Record Number: [id number] Patient Account Number: [id number] Date of Birth/Sex: 01-Nov-1940 (75 y.o. Male) Treating RN: Primary Care Physician: POLITE, RONALD Other Clinician: Jacqulyn Bath Referring Physician: POLITE, RONALD Treating Physician/Extender: Frann Rider in Treatment: 4 Encounter Discharge Information Items Discharge Pain Level: 0 Discharge Condition: Stable Ambulatory Status: Ambulatory Discharge Destination:  Home Transportation: Private Auto family Accompanied By: member Schedule Follow-up Appointment: No Medication Reconciliation completed No and provided to Patient/Care [name]: Clinical Summary of Care: Notes Patient has an HBO treatment scheduled on 02/26/15 at 08:00 AM. Electronic Signature(s) Signed: 02/25/2015 2:20:14 PM By: Lorine Bears RCP, RRT, CHT Entered By: Lorine Bears on 02/25/2015 10:30:25 [name] ([id number]) -------------------------------------------------------------------------------- Vitals Details Patient Name: [name] Date of Service: 02/25/2015 8:00 AM Medical Record Number: [id number] Patient Account Number: [id number] Date of Birth/Sex: [birth year] (75 y.o. Male) Treating RN: Primary Care Physician: POLITE, RONALD Other Clinician: Jacqulyn Bath Referring Physician: POLITE, RONALD Treating Physician/Extender: Frann Rider in Treatment: 4 Vital Signs Time Taken: 07:51 Temperature (F): 97.9 Height (in): 70 Pulse (bpm): 78 Weight (lbs): 243 Respiratory Rate (breaths/min): 18 Body Mass Index (BMI): 34.9 Blood Pressure (mmHg): 164/68 Capillary Blood Glucose (mg/dl): 136 Reference Range: 80 - 120 mg / dl Electronic Signature(s) Signed: 02/25/2015 2:20:14 PM By: Lorine Bears RCP, RRT, CHT Entered By: [name], [name] on 02/25/2015 08:08:20

## 2015-02-26 ENCOUNTER — Encounter: Payer: Medicare Other | Admitting: Surgery

## 2015-02-26 DIAGNOSIS — N3041 Irradiation cystitis with hematuria: Secondary | ICD-10-CM | POA: Diagnosis not present

## 2015-02-26 LAB — GLUCOSE, CAPILLARY
GLUCOSE-CAPILLARY: 105 mg/dL — AB (ref 65–99)
Glucose-Capillary: 201 mg/dL — ABNORMAL HIGH (ref 65–99)

## 2015-02-27 ENCOUNTER — Encounter: Payer: Medicare Other | Admitting: Surgery

## 2015-02-27 DIAGNOSIS — N3041 Irradiation cystitis with hematuria: Secondary | ICD-10-CM | POA: Diagnosis not present

## 2015-02-27 LAB — GLUCOSE, CAPILLARY
GLUCOSE-CAPILLARY: 198 mg/dL — AB (ref 65–99)
Glucose-Capillary: 164 mg/dL — ABNORMAL HIGH (ref 65–99)
Glucose-Capillary: 91 mg/dL (ref 65–99)
Glucose-Capillary: 110 mg/dL — ABNORMAL HIGH (ref 65–99)
Glucose-Capillary: 93 mg/dL (ref 65–99)

## 2015-02-27 NOTE — Progress Notes (Signed)
BRADELY, RUDIN ([id number]) Visit Report for 02/26/2015 HBO Details Patient Name: [name], [name]. Date of Service: 02/26/2015 8:00 AM Medical Record Number: [id number] Patient Account Number: [id number] Date of Birth/Sex: 11/17/1940 (75 y.o. 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 HBO Treatment Details Treatment Number: 17 Patient Type: Outpatient Chamber Type: Monoplace Chamber #: [id number] Treatment Protocol: 2.0 ATA with 90 minutes oxygen, and no air breaks Treatment Details Compression Rate Down: 1.5 psi / minute De-Compression Rate Up: 1.5 psi / minute Air breaks and breathing Compress Tx Pressure Decompress Decompress periods Begins Reached Begins Ends (leave unused spaces blank) Chamber Pressure 1 ATA 2.0 ATA - - - - - - 2.0 ATA 1 ATA Clock Time (24 hr) 08:05 08:17 - - - - - - 09:47 09:58 Treatment Length: 113 (minutes) Treatment Segments: 4 Capillary Blood Glucose Pre Capillary Blood Glucose (mg/dl): Post Capillary Blood Glucose (mg/dl): Vital Signs Capillary Blood Glucose Reference Range: 80 - 120 mg / dl HBO Diabetic Blood Glucose Intervention Range: <131 mg/dl or >249 mg/dl Time Vitals Blood Respiratory Capillary Blood Glucose Pulse Action Type: Pulse: Temperature: Taken: Pressure: Rate: Glucose (mg/dl): Meter #: Oximetry (%) Taken: Pre 07:50 140/64 84 18 98.6 201 1 none Post 10:13 150/70 84 18 98.6 105 1 none Treatment Response Treatment Completion Status: Treatment Completed without Adverse Event [name], [name] ([id number]) HBO Attestation I certify that I supervised this HBO  treatment in accordance with Medicare guidelines. A trained Yes emergency response team is readily available per hospital policies and procedures. Continue HBOT as ordered. Yes Electronic Signature(s) Signed: 02/26/2015 11:51:49 AM By: Christin Fudge MD, FACS Entered By: Christin Fudge on 02/26/2015 11:51:48 [name], [name] ([id number]) -------------------------------------------------------------------------------- HBO Safety Checklist Details Patient Name: [name] Date of Service: 02/26/2015 8:00 AM Medical Record Number: [id number] Patient Account Number: [id number] Date of Birth/Sex: [birth year] (75 y.o. Male) Treating RN: Primary Care Physician: POLITE, RONALD Other Clinician: Jacqulyn Bath Referring Physician: POLITE, RONALD Treating Physician/Extender: Frann Rider in Treatment: 4 HBO Safety Checklist Items Safety Checklist Consent Form Signed Patient voided / foley secured and emptied When did you last eato 06:30 am Last dose of injectable or oral agent 06:30 am NA Ostomy pouch emptied and vented if applicable NA All implantable devices assessed, documented and approved NA Intravenous access site secured and place Valuables secured Linens and cotton and cotton/polyester blend (less than 51% polyester) Personal oil-based products / skin lotions / body lotions removed Wigs or hairpieces removed Smoking or tobacco materials removed Books / newspapers / magazines / loose paper removed Cologne, aftershave, perfume and deodorant removed Jewelry removed (may wrap wedding band) Make-up removed Hair care products removed Battery operated devices (external) removed Heating patches and chemical warmers removed NA Titanium eyewear removed NA Nail polish cured greater than 10 hours NA Casting material cured greater than 10 hours NA Hearing aids removed NA Loose dentures or partials removed NA Prosthetics have been removed Patient demonstrates correct use  of air break device (if applicable) Patient concerns have been addressed Patient grounding bracelet on and cord attached to chamber Specifics for Inpatients (complete in addition to above) Medication sheet sent with patient Intravenous medications needed or due during therapy sent with patient [name], [name] ([id number]) Drainage tubes (  e.g. nasogastric tube or chest tube secured and vented) Endotracheal or Tracheotomy tube secured Cuff deflated of air and inflated with saline Airway suctioned Electronic Signature(s) Signed: 02/26/2015 3:58:54 PM By: Lorine Bears RCP, RRT, CHT Entered By: Lorine Bears on 02/26/2015 08:12:14

## 2015-02-27 NOTE — Progress Notes (Signed)
[name], [name] ([id number]) Visit Report for 02/26/2015 Arrival Information Details Patient Name: [name], [name]. Date of Service: 02/26/2015 8:00 AM Medical Record Number: [id number] Patient Account Number: [id number] Date of Birth/Sex: [birth year] (75 y.o. Male) Treating RN: Primary Care Physician: POLITE, RONALD Other Clinician: Jacqulyn Bath Referring Physician: POLITE, RONALD Treating Physician/Extender: Frann Rider in Treatment: 4 Visit Information History Since Last Visit Added or deleted any medications: No Patient Arrived: Ambulatory Any new allergies or adverse reactions: No Arrival Time: 08:50 Had a fall or experienced change in No Accompanied By: family activities of daily living that may affect member risk of falls: Transfer Assistance: None Signs or symptoms of abuse/neglect since last No Patient Identification Verified: Yes visito Secondary Verification Process Yes Hospitalized since last visit: No Completed: Pain Present Now: No Patient Requires Transmission- No Based Precautions: Patient Has Alerts: No Electronic Signature(s) Signed: 02/26/2015 3:58:54 PM By: Lorine Bears RCP, RRT, CHT Entered By: Lorine Bears on 02/26/2015 08:10:54 [name] ([id number]) -------------------------------------------------------------------------------- Encounter Discharge Information Details Patient Name: [name] Date of Service: 02/26/2015 8:00 AM Medical Record Number: [id number] Patient Account Number: [id number] Date of Birth/Sex: 05-Jul-1940 (75 y.o. Male) Treating RN: Primary Care Physician: POLITE, RONALD Other Clinician: Jacqulyn Bath Referring Physician: POLITE, RONALD Treating Physician/Extender: Frann Rider in Treatment: 4 Encounter Discharge Information Items Discharge Pain Level: 0 Discharge Condition: Stable Ambulatory Status: Ambulatory Discharge Destination:  Home Transportation: Private Auto family Accompanied By: member Schedule Follow-up Appointment: No Medication Reconciliation completed No and provided to Patient/Care Tyneka Scafidi: Clinical Summary of Care: Notes Patient has an HBO treatment scheduled on 02/27/15 at 08:00 am. Electronic Signature(s) Signed: 02/26/2015 3:58:54 PM By: Lorine Bears RCP, RRT, CHT Entered By: Lorine Bears on 02/26/2015 10:37:02 [name] ([id number]) -------------------------------------------------------------------------------- Vitals Details Patient Name: [name] Date of Service: 02/26/2015 8:00 AM Medical Record Number: [id number] Patient Account Number: [id number] Date of Birth/Sex: 1940/09/19 (75 y.o. Male) Treating RN: Primary Care Physician: POLITE, RONALD Other Clinician: Jacqulyn Bath Referring Physician: POLITE, RONALD Treating Physician/Extender: Frann Rider in Treatment: 4 Vital Signs Time Taken: 07:50 Temperature (F): 98.6 Height (in): 70 Pulse (bpm): 84 Weight (lbs): 243 Respiratory Rate (breaths/min): 18 Body Mass Index (BMI): 34.9 Blood Pressure (mmHg): 140/64 Capillary Blood Glucose (mg/dl): 201 Reference Range: 80 - 120 mg / dl Electronic Signature(s) Signed: 02/26/2015 3:58:54 PM By: Lorine Bears RCP, RRT, CHT Entered By: Becky Sax, Amado Nash on 02/26/2015 08:11:21

## 2015-02-28 NOTE — Progress Notes (Signed)
SUFYAAN, PALMA ([id number]) Visit Report for 02/27/2015 HBO Details Patient Name: [name], [name]. Date of Service: 02/27/2015 8:00 AM Medical Record Number: [id number] Patient Account Number: [id number] Date of Birth/Sex: 06-02-1940 (75 y.o. 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 HBO Treatment Details Treatment Number: 18 Patient Type: Outpatient Chamber Type: Monoplace Chamber #: [id number] Treatment Protocol: 2.0 ATA with 90 minutes oxygen, and no air breaks Treatment Details Compression Rate Down: 1.5 psi / minute De-Compression Rate Up: 1.5 psi / minute Air breaks and breathing Compress Tx Pressure Decompress Decompress periods Begins Reached Begins Ends (leave unused spaces blank) Chamber Pressure 1 ATA 2.0 ATA - - - - - - 2.0 ATA 1 ATA Clock Time (24 hr) 08:46 08:58 - - - - - - 10:28 10:38 Treatment Length: 112 (minutes) Treatment Segments: 4 Capillary Blood Glucose Pre Capillary Blood Glucose (mg/dl): Post Capillary Blood Glucose (mg/dl): Vital Signs Capillary Blood Glucose Reference Range: 80 - 120 mg / dl HBO Diabetic Blood Glucose Intervention Range: <131 mg/dl or >249 mg/dl Time Vitals Blood Respiratory Capillary Blood Glucose Pulse Action Type: Pulse: Temperature: Taken: Pressure: Rate: Glucose (mg/dl): Meter #: Oximetry (%) Taken: Pre 07:53 144/66 84 18 98.7 95 1 Gave Ensure Pre 08:30 110 1 proceed per protocol and Dr. Con Memos Post 10:43 148/80 90 18 98.4 93 1 none Treatment Response Treatment Completion Status: Treatment Completed without Adverse Event [name], [name]  ([id number]) Electronic Signature(s) Signed: 02/27/2015 4:34:29 PM By: Lorine Bears RCP, RRT, CHT Signed: 02/27/2015 4:48:22 PM By: Christin Fudge MD, FACS Previous Signature: 02/27/2015 10:41:46 AM Version By: Christin Fudge MD, FACS Entered By: Lorine Bears on 02/27/2015 10:58:12 [name], [name] ([id number]) -------------------------------------------------------------------------------- HBO Safety Checklist Details Patient Name: [name], [name]. Date of Service: 02/27/2015 8:00 AM Medical Record Number: [id number] Patient Account Number: [id number] Date of Birth/Sex: Mar 12, 1940 (75 y.o. Male) Treating RN: Primary Care Physician: POLITE, RONALD Other Clinician: Jacqulyn Bath Referring Physician: POLITE, RONALD Treating Physician/Extender: Frann Rider in Treatment: 4 HBO Safety Checklist Items Safety Checklist Consent Form Signed Patient voided / foley secured and emptied When did you last eato 06:30 am Last dose of injectable or oral agent 06:30 am NA Ostomy pouch emptied and vented if applicable NA All implantable devices assessed, documented and approved NA Intravenous access site secured and place Valuables secured Linens and cotton and cotton/polyester blend (less than 51% polyester) Personal oil-based products / skin lotions / body lotions removed Wigs or hairpieces removed Smoking or tobacco materials removed Books / newspapers / magazines / loose paper removed Cologne, aftershave, perfume and deodorant removed Jewelry removed (may wrap wedding band) Make-up removed Hair care products removed Battery operated devices (external) removed Heating patches and chemical warmers removed NA Titanium eyewear removed NA Nail polish cured greater than 10 hours NA Casting material cured greater than 10 hours NA Hearing aids removed NA Loose dentures or partials removed NA Prosthetics have been removed Patient demonstrates correct use  of air break device (if applicable) Patient concerns have been addressed Patient grounding bracelet on and cord attached to chamber Specifics for Inpatients (complete in addition to above) Medication sheet sent with patient Intravenous medications needed or due during therapy sent with patient [name], [name] ([id number]) Drainage tubes (  e.g. nasogastric tube or chest tube secured and vented) Endotracheal or Tracheotomy tube secured Cuff deflated of air and inflated with saline Airway suctioned Electronic Signature(s) Signed: 02/27/2015 4:34:29 PM By: Lorine Bears RCP, RRT, CHT Entered By: Lorine Bears on 02/27/2015 08:17:06

## 2015-02-28 NOTE — Progress Notes (Signed)
AVELARDO, REESMAN ([id number]) Visit Report for 02/27/2015 Arrival Information Details Patient Name: [name], [name]. Date of Service: 02/27/2015 8:00 AM Medical Record Number: [id number] Patient Account Number: [id number] Date of Birth/Sex: 05-13-1940 (75 y.o. Male) Treating RN: Primary Care Physician: POLITE, RONALD Other Clinician: Jacqulyn Bath Referring Physician: POLITE, RONALD Treating Physician/Extender: Frann Rider in Treatment: 4 Visit Information History Since Last Visit Added or deleted any medications: No Patient Arrived: Ambulatory Any new allergies or adverse reactions: No Arrival Time: 07:50 Had a fall or experienced change in No Accompanied By: family activities of daily living that may affect member risk of falls: Transfer Assistance: Transfer Signs or symptoms of abuse/neglect since last No Board visito Patient Identification Verified: Yes Hospitalized since last visit: No Secondary Verification Process Yes Pain Present Now: No Completed: Patient Requires Transmission- No Based Precautions: Patient Has Alerts: No Electronic Signature(s) Signed: 02/27/2015 4:34:29 PM By: Lorine Bears RCP, RRT, CHT Entered By: Lorine Bears on 02/27/2015 08:15:29 [name] ([id number]) -------------------------------------------------------------------------------- Encounter Discharge Information Details Patient Name: [name] Date of Service: 02/27/2015 8:00 AM Medical Record Number: [id number] Patient Account Number: [id number] Date of Birth/Sex: 01/11/1941 (75 y.o. Male) Treating RN: Primary Care Physician: POLITE, RONALD Other Clinician: Jacqulyn Bath Referring Physician: POLITE, RONALD Treating Physician/Extender: Frann Rider in Treatment: 4 Encounter Discharge Information Items Discharge Pain Level: 0 Discharge Condition: Stable Ambulatory Status: Ambulatory Discharge Destination:  Home Transportation: Private Auto family Accompanied By: member Schedule Follow-up Appointment: No Medication Reconciliation completed No and provided to Patient/Care Constantin Hillery: Clinical Summary of Care: Notes Patient has an HBO treatment scheduled on 03/02/15 at 08:00 am. Electronic Signature(s) Signed: 02/27/2015 4:34:29 PM By: Lorine Bears RCP, RRT, CHT Entered By: Lorine Bears on 02/27/2015 10:59:18 [name] ([id number]) -------------------------------------------------------------------------------- Vitals Details Patient Name: [name] Date of Service: 02/27/2015 8:00 AM Medical Record Number: [id number] Patient Account Number: [id number] Date of Birth/Sex: [birth year] (75 y.o. Male) Treating RN: Primary Care Physician: POLITE, RONALD Other Clinician: Jacqulyn Bath Referring Physician: POLITE, RONALD Treating Physician/Extender: Frann Rider in Treatment: 4 Vital Signs Time Taken: 07:53 Temperature (F): 98.7 Height (in): 70 Pulse (bpm): 84 Weight (lbs): 243 Respiratory Rate (breaths/min): 18 Body Mass Index (BMI): 34.9 Blood Pressure (mmHg): 144/66 Capillary Blood Glucose (mg/dl): 95 Reference Range: 80 - 120 mg / dl Electronic Signature(s) Signed: 02/27/2015 4:34:29 PM By: Lorine Bears RCP, RRT, CHT Entered By: Lorine Bears on 02/27/2015 08:16:06

## 2015-03-02 ENCOUNTER — Encounter: Payer: Medicare Other | Admitting: Surgery

## 2015-03-02 DIAGNOSIS — N3041 Irradiation cystitis with hematuria: Secondary | ICD-10-CM | POA: Diagnosis not present

## 2015-03-02 LAB — GLUCOSE, CAPILLARY
GLUCOSE-CAPILLARY: 95 mg/dL (ref 65–99)
GLUCOSE-CAPILLARY: 162 mg/dL — AB (ref 65–99)
GLUCOSE-CAPILLARY: 131 mg/dL — AB (ref 65–99)

## 2015-03-03 ENCOUNTER — Encounter: Payer: Medicare Other | Admitting: Surgery

## 2015-03-03 DIAGNOSIS — N3041 Irradiation cystitis with hematuria: Secondary | ICD-10-CM | POA: Diagnosis not present

## 2015-03-03 LAB — GLUCOSE, CAPILLARY
GLUCOSE-CAPILLARY: 168 mg/dL — AB (ref 65–99)
Glucose-Capillary: 183 mg/dL — ABNORMAL HIGH (ref 65–99)

## 2015-03-03 NOTE — Progress Notes (Signed)
KENDARRIUS, TANZI ([id number]) Visit Report for 03/02/2015 HBO Details Patient Name: [name], [name]. Date of Service: 03/02/2015 8:00 AM Medical Record Number: [id number] Patient Account Number: [id number] Date of Birth/Sex: November 28, 1940 (75 y.o. 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 HBO Treatment Details Treatment Number: 19 Patient Type: Outpatient Chamber Type: Monoplace Chamber Serial #: [id number] Treatment Protocol: 2.0 ATA with 90 minutes oxygen, and no air breaks Treatment Details Compression Rate Down: 1.5 psi / minute De-Compression Rate Up: 1.5 psi / minute Air breaks and breathing Compress Tx Pressure periods Decompress Decompress Begins Reached (leave unused spaces Begins Ends blank) Chamber Pressure (ATA) 1 2 - - - - - - 2 1 Clock Time (24 hr) 08:06 08:18 - - - - - - 09:49 09:59 Treatment Length: 113 (minutes) Treatment Segments: 4 Capillary Blood Glucose Pre Capillary Blood Glucose (mg/dl): Post Capillary Blood Glucose (mg/dl): Vital Signs Capillary Blood Glucose Reference Range: 80 - 120 mg / dl HBO Diabetic Blood Glucose Intervention Range: <131 mg/dl or >249 mg/dl Time Vitals Blood Respiratory Capillary Blood Glucose Pulse Action Type: Pulse: Temperature: Taken: Pressure: Rate: Glucose (mg/dl): Meter #: Oximetry (%) Taken: Pre 07:52 156/74 90 18 98.4 162 1 none Post 10:17 152/80 90 18 98.7 131 1 none Treatment Response Treatment Completion Status: Treatment Completed without Adverse Event CHRISTOPH, COPELAN ([id number]) HBO Attestation I certify that I supervised this HBO treatment  in accordance with Medicare guidelines. A trained Yes emergency response team is readily available per hospital policies and procedures. Continue HBOT as ordered. Yes Electronic Signature(s) Signed: 03/02/2015 10:59:24 AM By: Christin Fudge MD, FACS Entered By: Christin Fudge on 03/02/2015 10:59:24 [name] ([id number]) -------------------------------------------------------------------------------- HBO Safety Checklist Details Patient Name: [name] Date of Service: 03/02/2015 8:00 AM Medical Record Number: [id number] Patient Account Number: [id number] Date of Birth/Sex: [birth year] (75 y.o. Male) Treating RN: Primary Care Physician: POLITE, RONALD Other Clinician: Jacqulyn Bath Referring Physician: POLITE, RONALD Treating Physician/Extender: Frann Rider in Treatment: 4 HBO Safety Checklist Items Safety Checklist Consent Form Signed Patient voided / foley secured and emptied When did you last eato 06:30 am Last dose of injectable or oral agent 06:30 am NA Ostomy pouch emptied and vented if applicable NA All implantable devices assessed, documented and approved NA Intravenous access site secured and place Valuables secured Linens and cotton and cotton/polyester blend (less than 51% polyester) Personal oil-based products / skin lotions / body lotions removed Wigs or hairpieces removed Smoking or tobacco materials removed Books / newspapers / magazines / loose paper removed Cologne, aftershave, perfume and deodorant removed Jewelry removed (may wrap wedding band) Make-up removed Hair care products removed Battery operated devices (external) removed Heating patches and chemical warmers removed NA Titanium eyewear removed NA Nail polish cured greater than 10 hours NA Casting material cured greater than 10 hours NA Hearing aids removed NA Loose dentures or partials removed NA Prosthetics have been removed Patient demonstrates correct use of air  break device (if applicable) Patient concerns have been addressed Patient grounding bracelet on and cord attached to chamber Specifics for Inpatients (complete in addition to above) Medication sheet sent with patient Intravenous medications needed or due during therapy sent with patient KEANDRE, LINDEN ([id number]) Drainage tubes (e.g. nasogastric  tube or chest tube secured and vented) Endotracheal or Tracheotomy tube secured Cuff deflated of air and inflated with saline Airway suctioned Electronic Signature(s) Signed: 03/02/2015 5:05:02 PM By: Lorine Bears RCP, RRT, CHT Entered By: Lorine Bears on 03/02/2015 08:20:51

## 2015-03-03 NOTE — Progress Notes (Signed)
ZACARY, BAUER ([id number]) Visit Report for 03/02/2015 Arrival Information Details Patient Name: [name], [name]. Date of Service: 03/02/2015 8:00 AM Medical Record Number: [id number] Patient Account Number: [id number] Date of Birth/Sex: Jul 08, 1940 (75 y.o. Male) Treating RN: Primary Care Physician: POLITE, RONALD Other Clinician: Jacqulyn Bath Referring Physician: POLITE, RONALD Treating Physician/Extender: Frann Rider in Treatment: 4 Visit Information History Since Last Visit Added or deleted any medications: No Patient Arrived: Ambulatory Any new allergies or adverse reactions: No Arrival Time: 07:45 Had a fall or experienced change in No Accompanied By: family activities of daily living that may affect member risk of falls: Transfer Assistance: None Signs or symptoms of abuse/neglect since last No Patient Identification Verified: Yes visito Secondary Verification Process Yes Hospitalized since last visit: No Completed: Pain Present Now: No Patient Requires Transmission- No Based Precautions: Patient Has Alerts: No Electronic Signature(s) Signed: 03/02/2015 5:05:02 PM By: Lorine Bears RCP, RRT, CHT Entered By: Lorine Bears on 03/02/2015 08:19:23 [name] ([id number]) -------------------------------------------------------------------------------- Encounter Discharge Information Details Patient Name: [name] Date of Service: 03/02/2015 8:00 AM Medical Record Number: [id number] Patient Account Number: [id number] Date of Birth/Sex: [birth year] (75 y.o. Male) Treating RN: Primary Care Physician: POLITE, RONALD Other Clinician: Jacqulyn Bath Referring Physician: POLITE, RONALD Treating Physician/Extender: Frann Rider in Treatment: 4 Encounter Discharge Information Items Discharge Pain Level: 0 Discharge Condition: Stable Ambulatory Status: Ambulatory Discharge Destination:  Home Transportation: Private Auto family Accompanied By: member Schedule Follow-up Appointment: No Medication Reconciliation completed No and provided to Patient/Care Fionna Merriott: Clinical Summary of Care: Notes Patient has an HBO treatment scheduled on 03/03/15 at 08:00 am. Electronic Signature(s) Signed: 03/02/2015 5:05:02 PM By: Lorine Bears RCP, RRT, CHT Entered By: Lorine Bears on 03/02/2015 10:47:15 [name] ([id number]) -------------------------------------------------------------------------------- Vitals Details Patient Name: [name] Date of Service: 03/02/2015 8:00 AM Medical Record Number: [id number] Patient Account Number: [id number] Date of Birth/Sex: 1940/02/25 (75 y.o. Male) Treating RN: Primary Care Physician: POLITE, RONALD Other Clinician: Jacqulyn Bath Referring Physician: POLITE, RONALD Treating Physician/Extender: Frann Rider in Treatment: 4 Vital Signs Time Taken: 07:52 Temperature (F): 98.4 Height (in): 70 Pulse (bpm): 90 Weight (lbs): 243 Respiratory Rate (breaths/min): 18 Body Mass Index (BMI): 34.9 Blood Pressure (mmHg): 156/74 Capillary Blood Glucose (mg/dl): 162 Reference Range: 80 - 120 mg / dl Electronic Signature(s) Signed: 03/02/2015 5:05:02 PM By: Lorine Bears RCP, RRT, CHT Entered By: Lorine Bears on 03/02/2015 08:20:16

## 2015-03-04 ENCOUNTER — Encounter: Payer: Medicare Other | Attending: Internal Medicine | Admitting: Internal Medicine

## 2015-03-04 DIAGNOSIS — N3041 Irradiation cystitis with hematuria: Secondary | ICD-10-CM | POA: Diagnosis not present

## 2015-03-04 DIAGNOSIS — M109 Gout, unspecified: Secondary | ICD-10-CM | POA: Diagnosis not present

## 2015-03-04 DIAGNOSIS — Z8546 Personal history of malignant neoplasm of prostate: Secondary | ICD-10-CM | POA: Diagnosis not present

## 2015-03-04 DIAGNOSIS — E119 Type 2 diabetes mellitus without complications: Secondary | ICD-10-CM | POA: Insufficient documentation

## 2015-03-04 DIAGNOSIS — I10 Essential (primary) hypertension: Secondary | ICD-10-CM | POA: Insufficient documentation

## 2015-03-04 DIAGNOSIS — Z794 Long term (current) use of insulin: Secondary | ICD-10-CM | POA: Diagnosis not present

## 2015-03-04 LAB — GLUCOSE, CAPILLARY
GLUCOSE-CAPILLARY: 185 mg/dL — AB (ref 65–99)
GLUCOSE-CAPILLARY: 138 mg/dL — AB (ref 65–99)

## 2015-03-04 NOTE — Progress Notes (Signed)
ATHEL, MERRIWEATHER ([id number]) Visit Report for 03/03/2015 HBO Details Patient Name: [name], [name]. Date of Service: 03/03/2015 8:00 AM Medical Record Number: [id number] Patient Account Number: [id number] Date of Birth/Sex: May 16, 1940 (75 y.o. 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 HBO Treatment Details Treatment Number: 20 Patient Type: Outpatient Chamber Type: Monoplace Chamber Serial #: [id number] Treatment Protocol: 2.0 ATA with 90 minutes oxygen, and no air breaks Treatment Details Compression Rate Down: 1.5 psi / minute De-Compression Rate Up: 1.5 psi / minute Air breaks and breathing Compress Tx Pressure periods Decompress Decompress Begins Reached (leave unused spaces Begins Ends blank) Chamber Pressure (ATA) 1 2 - - - - - - 2 1 Clock Time (24 hr) 08:08 08:20 - - - - - - 09:50 10:00 Treatment Length: 112 (minutes) Treatment Segments: 4 Capillary Blood Glucose Pre Capillary Blood Glucose (mg/dl): Post Capillary Blood Glucose (mg/dl): Vital Signs Capillary Blood Glucose Reference Range: 80 - 120 mg / dl HBO Diabetic Blood Glucose Intervention Range: <131 mg/dl or >249 mg/dl Time Vitals Blood Respiratory Capillary Blood Glucose Pulse Action Type: Pulse: Temperature: Taken: Pressure: Rate: Glucose (mg/dl): Meter #: Oximetry (%) Taken: Pre 07:51 162/60 96 18 98.4 183 1 none Post 10:19 164/76 90 18 98.6 168 1 none Treatment Response Treatment Completion Status: Treatment Completed without Adverse Event [name], [name] ([id number]) HBO Attestation I certify that I supervised this HBO treatment  in accordance with Medicare guidelines. A trained Yes emergency response team is readily available per hospital policies and procedures. Continue HBOT as ordered. Yes Electronic Signature(s) Signed: 03/03/2015 12:03:14 PM By: Christin Fudge MD, FACS Entered By: Christin Fudge on 03/03/2015 12:03:14 [name], [name] ([id number]) -------------------------------------------------------------------------------- HBO Safety Checklist Details Patient Name: [name] Date of Service: 03/03/2015 8:00 AM Medical Record Number: [id number] Patient Account Number: [id number] Date of Birth/Sex: 02-11-1940 (75 y.o. Male) Treating RN: Primary Care Physician: POLITE, RONALD Other Clinician: Jacqulyn Bath Referring Physician: POLITE, RONALD Treating Physician/Extender: Frann Rider in Treatment: 5 HBO Safety Checklist Items Safety Checklist Consent Form Signed Patient voided / foley secured and emptied When did you last eato 06:30 am Last dose of injectable or oral agent 06:30 am NA Ostomy pouch emptied and vented if applicable NA All implantable devices assessed, documented and approved NA Intravenous access site secured and place Valuables secured Linens and cotton and cotton/polyester blend (less than 51% polyester) Personal oil-based products / skin lotions / body lotions removed Wigs or hairpieces removed Smoking or tobacco materials removed Books / newspapers / magazines / loose paper removed Cologne, aftershave, perfume and deodorant removed Jewelry removed (may wrap wedding band) Make-up removed Hair care products removed Battery operated devices (external) removed Heating patches and chemical warmers removed NA Titanium eyewear removed NA Nail polish cured greater than 10 hours NA Casting material cured greater than 10 hours NA Hearing aids removed NA Loose dentures or partials removed NA Prosthetics have been removed Patient demonstrates correct use of air  break device (if applicable) Patient concerns have been addressed Patient grounding bracelet on and cord attached to chamber Specifics for Inpatients (complete in addition to above) Medication sheet sent with patient Intravenous medications needed or due during therapy sent with patient [name], [name] ([id number]) Drainage tubes (e.g. nasogastric  tube or chest tube secured and vented) Endotracheal or Tracheotomy tube secured Cuff deflated of air and inflated with saline Airway suctioned Electronic Signature(s) Signed: 03/03/2015 4:12:39 PM By: Lorine Bears RCP, RRT, CHT Entered By: Lorine Bears on 03/03/2015 08:10:03

## 2015-03-04 NOTE — Progress Notes (Signed)
[name], [name] ([id number]) Visit Report for 03/03/2015 Arrival Information Details Patient Name: [name], [name]. Date of Service: 03/03/2015 8:00 AM Medical Record Number: [id number] Patient Account Number: [id number] Date of Birth/Sex: 1940/07/04 (75 y.o. Male) Treating RN: Primary Care Physician: POLITE, RONALD Other Clinician: Jacqulyn Bath Referring Physician: POLITE, RONALD Treating Physician/Extender: Frann Rider in Treatment: 5 Visit Information History Since Last Visit Added or deleted any medications: No Patient Arrived: Ambulatory Any new allergies or adverse reactions: No Arrival Time: 07:45 Had a fall or experienced change in No Accompanied By: family activities of daily living that may affect member risk of falls: Transfer Assistance: None Signs or symptoms of abuse/neglect since last No Patient Identification Verified: Yes visito Secondary Verification Process Yes Hospitalized since last visit: No Completed: Pain Present Now: No Patient Requires Transmission- No Based Precautions: Patient Has Alerts: No Electronic Signature(s) Signed: 03/03/2015 4:12:39 PM By: Lorine Bears RCP, RRT, CHT Entered By: Lorine Bears on 03/03/2015 08:02:26 [name] ([id number]) -------------------------------------------------------------------------------- Encounter Discharge Information Details Patient Name: [name] Date of Service: 03/03/2015 8:00 AM Medical Record Number: [id number] Patient Account Number: [id number] Date of Birth/Sex: [birth year] (75 y.o. Male) Treating RN: Primary Care Physician: POLITE, RONALD Other Clinician: Jacqulyn Bath Referring Physician: POLITE, RONALD Treating Physician/Extender: Frann Rider in Treatment: 5 Encounter Discharge Information Items Discharge Pain Level: 0 Discharge Condition: Stable Ambulatory Status: Ambulatory Discharge Destination:  Home Transportation: Private Auto family Accompanied By: member Schedule Follow-up Appointment: No Medication Reconciliation completed No and provided to Patient/Care Margareta Laureano: Clinical Summary of Care: Notes Patient has an HBO treatment scheduled on 03/04/15 at 08:00 am. Electronic Signature(s) Signed: 03/03/2015 4:12:39 PM By: Lorine Bears RCP, RRT, CHT Entered By: Lorine Bears on 03/03/2015 10:54:56 [name] ([id number]) -------------------------------------------------------------------------------- Vitals Details Patient Name: [name] Date of Service: 03/03/2015 8:00 AM Medical Record Number: [id number] Patient Account Number: [id number] Date of Birth/Sex: 1941-01-17 (75 y.o. Male) Treating RN: Primary Care Physician: POLITE, RONALD Other Clinician: Jacqulyn Bath Referring Physician: POLITE, RONALD Treating Physician/Extender: Frann Rider in Treatment: 5 Vital Signs Time Taken: 07:51 Temperature (F): 98.4 Height (in): 70 Pulse (bpm): 96 Weight (lbs): 243 Respiratory Rate (breaths/min): 18 Body Mass Index (BMI): 34.9 Blood Pressure (mmHg): 162/60 Capillary Blood Glucose (mg/dl): 183 Reference Range: 80 - 120 mg / dl Electronic Signature(s) Signed: 03/03/2015 4:12:39 PM By: Lorine Bears RCP, RRT, CHT Entered By: Becky Sax, Amado Nash on 03/03/2015 08:09:19

## 2015-03-05 ENCOUNTER — Encounter: Payer: Medicare Other | Admitting: Surgery

## 2015-03-05 DIAGNOSIS — N3041 Irradiation cystitis with hematuria: Secondary | ICD-10-CM | POA: Diagnosis not present

## 2015-03-05 LAB — GLUCOSE, CAPILLARY
GLUCOSE-CAPILLARY: 184 mg/dL — AB (ref 65–99)
Glucose-Capillary: 129 mg/dL — ABNORMAL HIGH (ref 65–99)

## 2015-03-05 NOTE — Progress Notes (Signed)
LEE, KUANG ([id number]) Visit Report for 03/04/2015 Arrival Information Details DANNI, LEABO Date of Service: 03/04/2015 8:00 AM Patient Name: H. Patient Account Number: [id number] Medical Record Treating RN: [id number] Number: Other Clinician: Jacqulyn Bath Date of Birth/Sex: 1940/11/04 (75 y.o. Male) Treating ROBSON, Gillespie Primary Care Physician/Extender: G POLITE, RONALD Physician: Referring Physician: POLITE, RONALD Weeks in Treatment: 5 Visit Information History Since Last Visit Added or deleted any medications: No Patient Arrived: Ambulatory Any new allergies or adverse reactions: No Arrival Time: 07:50 Had a fall or experienced change in No Accompanied By: family activities of daily living that may affect member risk of falls: Transfer Assistance: None Signs or symptoms of abuse/neglect since last No Patient Identification Verified: Yes visito Secondary Verification Process Yes Hospitalized since last visit: No Completed: Pain Present Now: No Patient Requires Transmission- No Based Precautions: Patient Has Alerts: No Electronic Signature(s) Signed: 03/04/2015 4:07:26 PM By: Lorine Bears RCP, RRT, CHT Entered By: Becky Sax, Amado Nash on 03/04/2015 08:10:40 [name] ([id number]) -------------------------------------------------------------------------------- Encounter Discharge Information Details [facility] Plan Date of Service: 03/04/2015 8:00 AM Patient Name: H. Patient Account Number: [id number] Medical Record Treating RN: [id number] Number: Other Clinician: Jacqulyn Bath Date of Birth/Sex: Jun 04, 1940 (74 y.o. Male) Treating ROBSON, MICHAEL Primary Care Physician/Extender: Rea [employer], RONALD Physician: Referring Physician: POLITE, RONALD Weeks in Treatment: 5 Encounter Discharge Information Items Discharge Pain Level: 0 Discharge Condition: Stable Ambulatory Status: Ambulatory Discharge Destination:  Home Transportation: Private Auto family Accompanied By: member Schedule Follow-up Appointment: No Medication Reconciliation completed No and provided to Patient/Care Auther Lyerly: Clinical Summary of Care: Notes Patient has an HBO treatment scheduled on 03/05/15 at 08:00 am. Electronic Signature(s) Signed: 03/04/2015 4:07:26 PM By: Lorine Bears RCP, RRT, CHT Entered By: Lorine Bears on 03/04/2015 10:13:21 CAMMERON, GREIS ([id number]) -------------------------------------------------------------------------------- Vitals Details [facility] Plan Date of Service: 03/04/2015 8:00 AM Patient Name: H. Patient Account Number: [id number] Medical Record Treating RN: [id number] Number: Other Clinician: Jacqulyn Bath Date of Birth/Sex: Dec 26, 1940 (75 y.o. Male) Treating ROBSON, MICHAEL Primary Care Physician/Extender: G POLITE, RONALD Physician: Referring Physician: POLITE, RONALD Weeks in Treatment: 5 Vital Signs Time Taken: 07:56 Temperature (F): 98.2 Height (in): 70 Pulse (bpm): 84 Weight (lbs): 243 Respiratory Rate (breaths/min): 18 Body Mass Index (BMI): 34.9 Blood Pressure (mmHg): 134/60 Capillary Blood Glucose (mg/dl): 185 Reference Range: 80 - 120 mg / dl Electronic Signature(s) Signed: 03/04/2015 4:07:26 PM By: Lorine Bears RCP, RRT, CHT Entered By: Becky Sax, Amado Nash on 03/04/2015 08:11:09

## 2015-03-05 NOTE — Progress Notes (Signed)
[name] [name] [name] [name] ([id number]) Visit Report for 03/04/2015 HBO Details [facility] Plan Date of Service: 03/04/2015 8:00 AM Patient Name: H. Patient Account Number: [id number] Medical Record Treating RN: [id number] Number: Other Clinician: Jacqulyn [name] Date of Birth/Sex: 25-Dec-1940 (74 y.o. 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 HBO Treatment Details Treatment Number: 21 Patient Type: Outpatient Chamber Type: Monoplace Chamber Serial #: [id number] Treatment Protocol: 2.0 ATA with 90 minutes oxygen, and no air breaks Treatment Details Compression Rate Down: 1.5 psi / minute De-Compression Rate Up: 1.5 psi / minute Air breaks and breathing Compress Tx Pressure periods Decompress Decompress Begins Reached (leave unused spaces Begins Ends blank) Chamber Pressure (ATA) 1 2 - - - - - - 2 1 Clock Time (24 hr) 08:09 08:21 - - - - - - 09:51 10:01 Treatment Length: 112 (minutes) Treatment Segments: 4 Capillary Blood Glucose Pre Capillary Blood Glucose (mg/dl): Post Capillary Blood Glucose (mg/dl): Vital Signs Capillary Blood Glucose Reference Range: 80 - 120 mg / dl HBO Diabetic Blood Glucose Intervention Range: <131 mg/dl or >249 mg/dl Time Vitals Blood Respiratory Capillary Blood Glucose Pulse Action Type: Pulse: Temperature: Taken: Pressure: Rate: Glucose (mg/dl): Meter #: Oximetry (%) Taken: Pre 07:56 134/60 84 18 98.2 185 1 none Post 10:05 128/566 78 18 98.5 138 1 none [name] [name] H. ([id number]) Treatment Response Treatment Completion Status: Treatment Completed without Adverse Event Physician Notes No concerns with treatment given HBO  Attestation I certify that I supervised this HBO treatment in accordance with Medicare guidelines. A trained Yes emergency response team is readily available per hospital policies and procedures. Continue HBOT as ordered. Yes Electronic Signature(s) Signed: 03/04/2015 4:36:57 PM By: Jeremiah Ham MD Entered By: [name] [name] on 03/04/2015 14:19:50 [name] [name] ([id number]) -------------------------------------------------------------------------------- HBO Safety Checklist Details [facility] Plan Date of Service: 03/04/2015 8:00 AM Patient Name: H. Patient Account Number: [id number] Medical Record Treating RN: [id number] Number: Other Clinician: Jacqulyn [name] Date of Birth/Sex: 11-07-1940 (74 y.o. Male) Treating [name] [name] Primary Care Physician/Extender: [name] [name] Physician: Referring Physician: POLITE, [name] Weeks in Treatment: 5 HBO Safety Checklist Items Safety Checklist Consent Form Signed Patient voided / foley secured and emptied When did you last eato 06:30 am Last dose of injectable or oral agent 06:30 am NA Ostomy pouch emptied and vented if applicable NA All implantable devices assessed, documented and approved NA Intravenous access site secured and place Valuables secured Linens and cotton and cotton/polyester blend (less than 51% polyester) Personal oil-based products / skin lotions / body lotions removed Wigs or hairpieces removed Smoking or tobacco materials removed Books / newspapers / magazines / loose paper removed Cologne, aftershave, perfume and deodorant removed Jewelry removed (may wrap wedding band) Make-up removed Hair care products removed Battery operated devices (external) removed Heating patches and chemical warmers removed NA Titanium eyewear removed NA Nail polish cured greater than 10 hours NA Casting material cured greater than 10 hours NA Hearing aids removed NA Loose dentures or partials removed NA Prosthetics  have been removed Patient demonstrates correct use of air break device (if applicable) Patient concerns have been addressed Patient grounding bracelet on and cord attached to chamber Specifics for Inpatients (complete in addition to above) Medication sheet sent with patient [name] [name] [name] [name] ([id number]) Intravenous medications needed or due during  therapy sent with patient Drainage tubes (e.Jeremiah. nasogastric tube or chest tube secured and vented) Endotracheal or Tracheotomy tube secured Cuff deflated of air and inflated with saline Airway suctioned Electronic Signature(s) Signed: 03/04/2015 4:07:26 PM By: [name] [name] RCP, RRT, CHT Entered By: [name] [name] on 03/04/2015 08:11:45

## 2015-03-06 ENCOUNTER — Encounter: Payer: Medicare Other | Admitting: Surgery

## 2015-03-06 DIAGNOSIS — N3041 Irradiation cystitis with hematuria: Secondary | ICD-10-CM | POA: Diagnosis not present

## 2015-03-06 LAB — GLUCOSE, CAPILLARY
GLUCOSE-CAPILLARY: 204 mg/dL — AB (ref 65–99)
GLUCOSE-CAPILLARY: 148 mg/dL — AB (ref 65–99)

## 2015-03-06 NOTE — Progress Notes (Signed)
[name], [name] ([id number]) Visit Report for 03/05/2015 Arrival Information Details Patient Name: [name], [name]. Date of Service: 03/05/2015 8:00 AM Medical Record Number: [id number] Patient Account Number: [id number] Date of Birth/Sex: May 09, 1940 (75 y.o. Male) Treating RN: Primary Care Physician: POLITE, RONALD Other Clinician: Jacqulyn Bath Referring Physician: POLITE, RONALD Treating Physician/Extender: Frann Rider in Treatment: 5 Visit Information History Since Last Visit Added or deleted any medications: No Patient Arrived: Ambulatory Any new allergies or adverse reactions: No Arrival Time: 07:50 Had a fall or experienced change in No Accompanied By: family activities of daily living that may affect member risk of falls: Transfer Assistance: None Signs or symptoms of abuse/neglect since last No Patient Identification Verified: Yes visito Secondary Verification Process Yes Hospitalized since last visit: No Completed: Pain Present Now: No Patient Requires Transmission- No Based Precautions: Patient Has Alerts: No Electronic Signature(s) Signed: 03/05/2015 4:13:01 PM By: Lorine Bears RCP, RRT, CHT Entered By: Lorine Bears on 03/05/2015 08:08:18 [name] ([id number]) -------------------------------------------------------------------------------- Encounter Discharge Information Details Patient Name: [name] Date of Service: 03/05/2015 8:00 AM Medical Record Number: [id number] Patient Account Number: [id number] Date of Birth/Sex: 05/18/1940 (75 y.o. Male) Treating RN: Primary Care Physician: POLITE, RONALD Other Clinician: Jacqulyn Bath Referring Physician: POLITE, RONALD Treating Physician/Extender: Frann Rider in Treatment: 5 Encounter Discharge Information Items Discharge Pain Level: 0 Discharge Condition: Stable Ambulatory Status: Ambulatory Discharge Destination: Home Transportation:  Private Auto family Accompanied By: member Schedule Follow-up Appointment: No Medication Reconciliation completed No and provided to Patient/Care Loukisha Gunnerson: Clinical Summary of Care: Notes Patient has an HBO treatment scheduled on 03/06/15 at 08:00 am. Electronic Signature(s) Signed: 03/05/2015 4:13:01 PM By: Lorine Bears RCP, RRT, CHT Entered By: Lorine Bears on 03/05/2015 10:26:18 [name] ([id number]) -------------------------------------------------------------------------------- Vitals Details Patient Name: [name] Date of Service: 03/05/2015 8:00 AM Medical Record Number: [id number] Patient Account Number: [id number] Date of Birth/Sex: 1941/01/03 (75 y.o. Male) Treating RN: Primary Care Physician: POLITE, RONALD Other Clinician: Jacqulyn Bath Referring Physician: POLITE, RONALD Treating Physician/Extender: Frann Rider in Treatment: 5 Vital Signs Time Taken: 07:52 Temperature (F): 98.4 Height (in): 70 Pulse (bpm): 102 Weight (lbs): 243 Respiratory Rate (breaths/min): 18 Body Mass Index (BMI): 34.9 Blood Pressure (mmHg): 150/70 Capillary Blood Glucose (mg/dl): 184 Reference Range: 80 - 120 mg / dl Electronic Signature(s) Signed: 03/05/2015 4:13:01 PM By: Lorine Bears RCP, RRT, CHT Entered By: Becky Sax, Amado Nash on 03/05/2015 08:08:44

## 2015-03-06 NOTE — Progress Notes (Signed)
MIRZA, FESSEL ([id number]) Visit Report for 03/05/2015 HBO Details Patient Name: [name], [name]. Date of Service: 03/05/2015 8:00 AM Medical Record Number: [id number] Patient Account Number: [id number] Date of Birth/Sex: Jun 04, 1940 (75 y.o. 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 HBO Treatment Details Treatment Number: 22 Patient Type: Outpatient Chamber Type: Monoplace Chamber Serial #: [id number] Treatment Protocol: 2.0 ATA with 90 minutes oxygen, and no air breaks Treatment Details Compression Rate Down: 1.5 psi / minute De-Compression Rate Up: 1.5 psi / minute Air breaks and breathing Compress Tx Pressure periods Decompress Decompress Begins Reached (leave unused spaces Begins Ends blank) Chamber Pressure (ATA) 1 2 - - - - - - 2 1 Clock Time (24 hr) 08:07 08:18 - - - - - - 09:48 09:59 Treatment Length: 112 (minutes) Treatment Segments: 4 Capillary Blood Glucose Pre Capillary Blood Glucose (mg/dl): Post Capillary Blood Glucose (mg/dl): Vital Signs Capillary Blood Glucose Reference Range: 80 - 120 mg / dl HBO Diabetic Blood Glucose Intervention Range: <131 mg/dl or >249 mg/dl Time Vitals Blood Respiratory Capillary Blood Glucose Pulse Action Type: Pulse: Temperature: Taken: Pressure: Rate: Glucose (mg/dl): Meter #: Oximetry (%) Taken: Pre 07:52 150/70 102 18 98.4 184 1 none Post 10:14 152/84 90 18 98.4 129 1 none Treatment Response Treatment Completion Status: Treatment Completed without Adverse Event [name], [name] ([id number]) HBO Attestation I certify that I supervised this HBO treatment  in accordance with Medicare guidelines. A trained Yes emergency response team is readily available per hospital policies and procedures. Continue HBOT as ordered. Yes Electronic Signature(s) Signed: 03/05/2015 10:49:07 AM By: Christin Fudge MD, FACS Entered By: Christin Fudge on 03/05/2015 10:49:06 [name], [name] ([id number]) -------------------------------------------------------------------------------- HBO Safety Checklist Details Patient Name: [name] Date of Service: 03/05/2015 8:00 AM Medical Record Number: [id number] Patient Account Number: [id number] Date of Birth/Sex: August 12, 1940 (75 y.o. Male) Treating RN: Primary Care Physician: POLITE, RONALD Other Clinician: Jacqulyn Bath Referring Physician: POLITE, RONALD Treating Physician/Extender: Frann Rider in Treatment: 5 HBO Safety Checklist Items Safety Checklist Consent Form Signed Patient voided / foley secured and emptied When did you last eato 06:30 am Last dose of injectable or oral agent 06:30 am NA Ostomy pouch emptied and vented if applicable NA All implantable devices assessed, documented and approved NA Intravenous access site secured and place Valuables secured Linens and cotton and cotton/polyester blend (less than 51% polyester) Personal oil-based products / skin lotions / body lotions removed Wigs or hairpieces removed Smoking or tobacco materials removed Books / newspapers / magazines / loose paper removed Cologne, aftershave, perfume and deodorant removed Jewelry removed (may wrap wedding band) Make-up removed Hair care products removed Battery operated devices (external) removed Heating patches and chemical warmers removed NA Titanium eyewear removed NA Nail polish cured greater than 10 hours NA Casting material cured greater than 10 hours NA Hearing aids removed NA Loose dentures or partials removed NA Prosthetics have been removed Patient demonstrates correct use of air  break device (if applicable) Patient concerns have been addressed Patient grounding bracelet on and cord attached to chamber Specifics for Inpatients (complete in addition to above) Medication sheet sent with patient Intravenous medications needed or due during therapy sent with patient [name], [name] ([id number]) Drainage tubes (e.g. nasogastric  tube or chest tube secured and vented) Endotracheal or Tracheotomy tube secured Cuff deflated of air and inflated with saline Airway suctioned Electronic Signature(s) Signed: 03/05/2015 4:13:01 PM By: Lorine Bears RCP, RRT, CHT Entered By: Lorine Bears on 03/05/2015 08:09:33

## 2015-03-08 NOTE — Progress Notes (Signed)
[name], [name] ([id number]) Visit Report for 03/06/2015 Arrival Information Details Patient Name: [name], [name]. Date of Service: 03/06/2015 8:00 AM Medical Record Number: [id number] Patient Account Number: [id number] Date of Birth/Sex: 1940/03/30 (75 y.o. Male) Treating RN: Primary Care Physician: POLITE, RONALD Other Clinician: Jacqulyn Bath Referring Physician: POLITE, RONALD Treating Physician/Extender: Frann Rider in Treatment: 5 Visit Information History Since Last Visit Added or deleted any medications: No Patient Arrived: Ambulatory Any new allergies or adverse reactions: No Arrival Time: 07:50 Had a fall or experienced change in No Accompanied By: family activities of daily living that may affect member risk of falls: Transfer Assistance: None Hospitalized since last visit: No Patient Identification Verified: Yes Pain Present Now: No Secondary Verification Process Yes Completed: Patient Requires Transmission- No Based Precautions: Patient Has Alerts: No Electronic Signature(s) Signed: 03/06/2015 4:04:19 PM By: Lorine Bears RCP, RRT, CHT Entered By: Lorine Bears on 03/06/2015 08:06:30 [name] ([id number]) -------------------------------------------------------------------------------- Encounter Discharge Information Details Patient Name: [name] Date of Service: 03/06/2015 8:00 AM Medical Record Number: [id number] Patient Account Number: [id number] Date of Birth/Sex: 03-Feb-1940 (75 y.o. Male) Treating RN: Primary Care Physician: POLITE, RONALD Other Clinician: Jacqulyn Bath Referring Physician: POLITE, RONALD Treating Physician/Extender: Frann Rider in Treatment: 5 Encounter Discharge Information Items Discharge Pain Level: 0 Discharge Condition: Stable Ambulatory Status: Ambulatory Discharge Destination: Home Transportation: Private Auto family Accompanied By: member Schedule  Follow-up Appointment: No Medication Reconciliation completed No and provided to Patient/Care Faun Mcqueen: Clinical Summary of Care: Notes Patient has an HBO treatment scheduled on 03/09/15 at 08:00 am. Electronic Signature(s) Signed: 03/06/2015 4:04:19 PM By: Lorine Bears RCP, RRT, CHT Entered By: Lorine Bears on 03/06/2015 10:15:14 [name] ([id number]) -------------------------------------------------------------------------------- Vitals Details Patient Name: [name] Date of Service: 03/06/2015 8:00 AM Medical Record Number: [id number] Patient Account Number: [id number] Date of Birth/Sex: 24-Nov-1940 (75 y.o. Male) Treating RN: Primary Care Physician: POLITE, RONALD Other Clinician: Jacqulyn Bath Referring Physician: POLITE, RONALD Treating Physician/Extender: Frann Rider in Treatment: 5 Vital Signs Time Taken: 07:52 Temperature (F): 98.4 Height (in): 70 Pulse (bpm): 84 Weight (lbs): 243 Respiratory Rate (breaths/min): 18 Body Mass Index (BMI): 34.9 Blood Pressure (mmHg): 142/62 Capillary Blood Glucose (mg/dl): 204 Reference Range: 80 - 120 mg / dl Electronic Signature(s) Signed: 03/06/2015 4:04:19 PM By: Lorine Bears RCP, RRT, CHT Entered By: Lorine Bears on 03/06/2015 08:07:37

## 2015-03-08 NOTE — Progress Notes (Signed)
BARY, LIMBACH ([id number]) Visit Report for 03/06/2015 HBO Details Patient Name: [name], COE. Date of Service: 03/06/2015 8:00 AM Medical Record Number: [id number] Patient Account Number: [id number] Date of Birth/Sex: 1940-09-11 (75 y.o. 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 HBO Treatment Details Treatment Number: 23 Patient Type: Outpatient Chamber Type: Monoplace Chamber Serial #: [id number] Treatment Protocol: 2.0 ATA with 90 minutes oxygen, and no air breaks Treatment Details Compression Rate Down: 1.5 psi / minute De-Compression Rate Up: 1.5 psi / minute Air breaks and breathing Compress Tx Pressure periods Decompress Decompress Begins Reached (leave unused spaces Begins Ends blank) Chamber Pressure (ATA) 1 2 - - - - - - 2 1 Clock Time (24 hr) 08:05 08:17 - - - - - - 09:47 09:58 Treatment Length: 113 (minutes) Treatment Segments: 4 Capillary Blood Glucose Pre Capillary Blood Glucose (mg/dl): Post Capillary Blood Glucose (mg/dl): Vital Signs Capillary Blood Glucose Reference Range: 80 - 120 mg / dl HBO Diabetic Blood Glucose Intervention Range: <131 mg/dl or >249 mg/dl Time Vitals Blood Respiratory Capillary Blood Glucose Pulse Action Type: Pulse: Temperature: Taken: Pressure: Rate: Glucose (mg/dl): Meter #: Oximetry (%) Taken: Pre 07:52 142/62 84 18 98.4 204 1 none Post 10:02 140/70 78 18 98.7 148 1 none Treatment Response Treatment Completion Status: Treatment Completed without Adverse Event [name], [name] ([id number]) HBO Attestation I certify that I supervised this HBO treatment  in accordance with Medicare guidelines. A trained Yes emergency response team is readily available per hospital policies and procedures. Continue HBOT as ordered. Yes Electronic Signature(s) Signed: 03/06/2015 11:17:35 AM By: Christin Fudge MD, FACS Entered By: Christin Fudge on 03/06/2015 11:17:35 [name], [name] ([id number]) -------------------------------------------------------------------------------- HBO Safety Checklist Details Patient Name: [name] Date of Service: 03/06/2015 8:00 AM Medical Record Number: [id number] Patient Account Number: [id number] Date of Birth/Sex: 11/07/1940 (75 y.o. Male) Treating RN: Primary Care Physician: POLITE, RONALD Other Clinician: Jacqulyn Bath Referring Physician: POLITE, RONALD Treating Physician/Extender: Frann Rider in Treatment: 5 HBO Safety Checklist Items Safety Checklist Consent Form Signed Patient voided / foley secured and emptied When did you last eato 06:30 am Last dose of injectable or oral agent 06:30 am NA Ostomy pouch emptied and vented if applicable NA All implantable devices assessed, documented and approved NA Intravenous access site secured and place Valuables secured Linens and cotton and cotton/polyester blend (less than 51% polyester) Personal oil-based products / skin lotions / body lotions removed Wigs or hairpieces removed Smoking or tobacco materials removed Books / newspapers / magazines / loose paper removed Cologne, aftershave, perfume and deodorant removed Jewelry removed (may wrap wedding band) Make-up removed Hair care products removed Battery operated devices (external) removed Heating patches and chemical warmers removed NA Titanium eyewear removed NA Nail polish cured greater than 10 hours NA Casting material cured greater than 10 hours NA Hearing aids removed NA Loose dentures or partials removed NA Prosthetics have been removed Patient demonstrates correct use of air  break device (if applicable) Patient concerns have been addressed Patient grounding bracelet on and cord attached to chamber Specifics for Inpatients (complete in addition to above) Medication sheet sent with patient Intravenous medications needed or due during therapy sent with patient [name], [name] ([id number]) Drainage tubes (e.g. nasogastric  tube or chest tube secured and vented) Endotracheal or Tracheotomy tube secured Cuff deflated of air and inflated with saline Airway suctioned Electronic Signature(s) Signed: 03/06/2015 4:04:19 PM By: Lorine Bears RCP, RRT, CHT Entered By: Lorine Bears on 03/06/2015 08:08:30

## 2015-03-09 ENCOUNTER — Encounter: Payer: Medicare Other | Admitting: Surgery

## 2015-03-09 DIAGNOSIS — N3041 Irradiation cystitis with hematuria: Secondary | ICD-10-CM | POA: Diagnosis not present

## 2015-03-09 LAB — GLUCOSE, CAPILLARY: Glucose-Capillary: 110 mg/dL — ABNORMAL HIGH (ref 65–99)

## 2015-03-10 ENCOUNTER — Encounter: Payer: Medicare Other | Admitting: Internal Medicine

## 2015-03-10 DIAGNOSIS — N3041 Irradiation cystitis with hematuria: Secondary | ICD-10-CM | POA: Diagnosis not present

## 2015-03-10 LAB — GLUCOSE, CAPILLARY
GLUCOSE-CAPILLARY: 136 mg/dL — AB (ref 65–99)
Glucose-Capillary: 207 mg/dL — ABNORMAL HIGH (ref 65–99)

## 2015-03-10 NOTE — Progress Notes (Signed)
TAVARIUS, GREWE ([id number]) Visit Report for 03/09/2015 Arrival Information Details Patient Name: [name], [name]. Date of Service: 03/09/2015 8:00 AM Medical Record Number: [id number] Patient Account Number: [id number] Date of Birth/Sex: [birth year] (75 y.o. Male) Treating RN: Primary Care Physician: POLITE, RONALD Other Clinician: Jacqulyn Bath Referring Physician: POLITE, RONALD Treating Physician/Extender: Frann Rider in Treatment: 5 Visit Information History Since Last Visit Added or deleted any medications: No Patient Arrived: Ambulatory Any new allergies or adverse reactions: No Arrival Time: 07:50 Had a fall or experienced change in No Accompanied By: family activities of daily living that may affect member risk of falls: Transfer Assistance: None Hospitalized since last visit: No Patient Identification Verified: Yes Pain Present Now: No Secondary Verification Process Yes Completed: Patient Requires Transmission- No Based Precautions: Patient Has Alerts: No Electronic Signature(s) Signed: 03/09/2015 3:38:53 PM By: Lorine Bears RCP, RRT, CHT Entered By: Lorine Bears on 03/09/2015 08:17:01 [name] ([id number]) -------------------------------------------------------------------------------- Encounter Discharge Information Details Patient Name: [name] Date of Service: 03/09/2015 8:00 AM Medical Record Number: [id number] Patient Account Number: [id number] Date of Birth/Sex: February 18, 1940 (75 y.o. Male) Treating RN: Primary Care Physician: POLITE, RONALD Other Clinician: Jacqulyn Bath Referring Physician: POLITE, RONALD Treating Physician/Extender: Frann Rider in Treatment: 5 Encounter Discharge Information Items Discharge Pain Level: 0 Discharge Condition: Stable Ambulatory Status: Ambulatory Discharge Destination: Home Transportation: Private Auto family Accompanied By: member Schedule  Follow-up Appointment: No Medication Reconciliation completed No and provided to Patient/Care Annalicia Renfrew: Clinical Summary of Care: Notes Patient has an HBO treatment scheduled on 03/10/15 at 08:00 am. Electronic Signature(s) Signed: 03/09/2015 3:38:53 PM By: Lorine Bears RCP, RRT, CHT Entered By: Lorine Bears on 03/09/2015 10:41:38 [name] ([id number]) -------------------------------------------------------------------------------- Vitals Details Patient Name: [name] Date of Service: 03/09/2015 8:00 AM Medical Record Number: [id number] Patient Account Number: [id number] Date of Birth/Sex: [birth year] (75 y.o. Male) Treating RN: Primary Care Physician: POLITE, RONALD Other Clinician: Jacqulyn Bath Referring Physician: POLITE, RONALD Treating Physician/Extender: Frann Rider in Treatment: 5 Vital Signs Time Taken: 07:56 Temperature (F): 98.3 Height (in): 70 Pulse (bpm): 84 Weight (lbs): 243 Respiratory Rate (breaths/min): 18 Body Mass Index (BMI): 34.9 Blood Pressure (mmHg): 154/70 Capillary Blood Glucose (mg/dl): 207 Reference Range: 80 - 120 mg / dl Electronic Signature(s) Signed: 03/09/2015 3:38:53 PM By: Lorine Bears RCP, RRT, CHT Entered By: Becky Sax, Amado Nash on 03/09/2015 08:18:17

## 2015-03-10 NOTE — Progress Notes (Signed)
[name], [name] ([id number]) Visit Report for 03/09/2015 HBO Details Patient Name: [name], [name]. Date of Service: 03/09/2015 8:00 AM Medical Record Number: [id number] Patient Account Number: [id number] Date of Birth/Sex: 08-23-1940 (75 y.o. 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 HBO Treatment Details Treatment Number: 24 Patient Type: Outpatient Chamber Type: Monoplace Chamber Serial #: [id number] Treatment Protocol: 2.0 ATA with 90 minutes oxygen, and no air breaks Treatment Details Compression Rate Down: 1.5 psi / minute De-Compression Rate Up: 1.5 psi / minute Air breaks and breathing Compress Tx Pressure periods Decompress Decompress Begins Reached (leave unused spaces Begins Ends blank) Chamber Pressure (ATA) 1 2 - - - - - - 2 1 Clock Time (24 hr) 08:14 08:26 - - - - - - 09:56 10:06 Treatment Length: 112 (minutes) Treatment Segments: 4 Capillary Blood Glucose Pre Capillary Blood Glucose (mg/dl): Post Capillary Blood Glucose (mg/dl): Vital Signs Capillary Blood Glucose Reference Range: 80 - 120 mg / dl HBO Diabetic Blood Glucose Intervention Range: <131 mg/dl or >249 mg/dl Time Vitals Blood Respiratory Capillary Blood Glucose Pulse Action Type: Pulse: Temperature: Taken: Pressure: Rate: Glucose (mg/dl): Meter #: Oximetry (%) Taken: Pre 07:56 154/70 84 18 98.3 207 1 none Post 10:25 158/66 90 18 98.5 110 1 none Treatment Response Treatment Completion Status: Treatment Completed without Adverse Event [name], [name] ([id number]) HBO Attestation [name] certify that [name] supervised this HBO treatment  in accordance with Medicare guidelines. A trained Yes emergency response team is readily available per hospital policies and procedures. Continue HBOT as ordered. Yes Electronic Signature(s) Signed: 03/09/2015 10:48:57 AM By: Christin Fudge MD, FACS Entered By: Christin Fudge on 03/09/2015 10:48:57 [name] ([id number]) -------------------------------------------------------------------------------- HBO Safety Checklist Details Patient Name: [name] Date of Service: 03/09/2015 8:00 AM Medical Record Number: [id number] Patient Account Number: [id number] Date of Birth/Sex: April 02, 1940 (75 y.o. Male) Treating RN: Primary Care Physician: POLITE, RONALD Other Clinician: Jacqulyn Bath Referring Physician: POLITE, RONALD Treating Physician/Extender: Frann Rider in Treatment: 5 HBO Safety Checklist Items Safety Checklist Consent Form Signed Patient voided / foley secured and emptied When did you last eato 06:30 am Last dose of injectable or oral agent 06:30 am NA Ostomy pouch emptied and vented if applicable NA All implantable devices assessed, documented and approved NA Intravenous access site secured and place Valuables secured Linens and cotton and cotton/polyester blend (less than 51% polyester) Personal oil-based products / skin lotions / body lotions removed Wigs or hairpieces removed Smoking or tobacco materials removed Books / newspapers / magazines / loose paper removed Cologne, aftershave, perfume and deodorant removed Jewelry removed (may wrap wedding band) Make-up removed Hair care products removed Battery operated devices (external) removed Heating patches and chemical warmers removed NA Titanium eyewear removed NA Nail polish cured greater than 10 hours NA Casting material cured greater than 10 hours NA Hearing aids removed NA Loose dentures or partials removed NA Prosthetics have been removed Patient demonstrates correct use of air  break device (if applicable) Patient concerns have been addressed Patient grounding bracelet on and cord attached to chamber Specifics for Inpatients (complete in addition to above) Medication sheet sent with patient Intravenous medications needed or due during therapy sent with patient [name], [name] ([id number]) Drainage tubes (e.g. nasogastric  tube or chest tube secured and vented) Endotracheal or Tracheotomy tube secured Cuff deflated of air and inflated with saline Airway suctioned Electronic Signature(s) Signed: 03/09/2015 3:38:53 PM By: Lorine Bears RCP, RRT, CHT Entered By: Lorine Bears on 03/09/2015 08:19:05

## 2015-03-10 NOTE — Progress Notes (Signed)
[name], [name] ([id number]) Visit Report for 03/10/2015 Arrival Information Details [name], [name] Date of Service: 03/10/2015 8:00 AM Patient Name: H. Patient Account Number: [id number] Medical Record Treating RN: [id number] Number: Other Clinician: Jacqulyn [name] Date of Birth/Sex: 09/13/1940 (75 y.o. Male) Treating [name] Primary Care Physician/Extender: [name] Physician: Referring Physician: POLITE, [name] Weeks in Treatment: 6 Visit Information History Since Last Visit Added or deleted any medications: No Patient Arrived: Ambulatory Any new allergies or adverse reactions: No Arrival Time: 07:50 Had a fall or experienced change in No Accompanied By: family activities of daily living that may affect member risk of falls: Transfer Assistance: None Signs or symptoms of abuse/neglect since last No Patient Identification Verified: Yes visito Secondary Verification Process Yes Hospitalized since last visit: No Completed: Pain Present Now: No Patient Requires Transmission- No Based Precautions: Patient Has Alerts: No Electronic Signature(s) Signed: 03/10/2015 1:51:29 PM By: [name] Entered By: [name] on 03/10/2015 08:05:40 [name] ([id number]) -------------------------------------------------------------------------------- Encounter Discharge Information Details [name] Date of Service: 03/10/2015 8:00 AM Patient Name: H. Patient Account Number: [id number] Medical Record Treating RN: [id number] Number: Other Clinician: Jacqulyn [name] Date of Birth/Sex: 1940/03/10 (74 y.o. Male) Treating [name] Primary Care Physician/Extender: [name] Physician: Referring Physician: POLITE, [name] Weeks in Treatment: 6 Encounter Discharge Information Items Discharge Pain Level: 0 Discharge Condition: Stable Ambulatory Status: Ambulatory Discharge Destination:  Home Transportation: Private Auto family Accompanied By: member Schedule Follow-up Appointment: No Medication Reconciliation completed No and provided to Patient/Care [name]: Clinical Summary of Care: Notes Patient has an HBO treatment scheduled on 03/11/15 at 08:00 am. Electronic Signature(s) Signed: 03/10/2015 1:51:29 PM By: [name] Entered By: [name] on 03/10/2015 10:39:49 [name], [name] ([id number]) -------------------------------------------------------------------------------- Vitals Details [name] Date of Service: 03/10/2015 8:00 AM Patient Name: H. Patient Account Number: [id number] Medical Record Treating RN: [id number] Number: Other Clinician: Jacqulyn [name] Date of Birth/Sex: [birth year] (75 y.o. Male) Treating [name] Primary Care Physician/Extender: [name] Physician: Referring Physician: POLITE, [name] Weeks in Treatment: 6 Vital Signs Time Taken: 07:50 Temperature (F): 98.0 Height (in): 70 Pulse (bpm): 96 Weight (lbs): 243 Respiratory Rate (breaths/min): 18 Body Mass Index (BMI): 34.9 Blood Pressure (mmHg): 158/90 Capillary Blood Glucose (mg/dl): 175 Reference Range: 80 - 120 mg / dl Electronic Signature(s) Signed: 03/10/2015 1:51:29 PM By: [name] Entered By: [name] on 03/10/2015 08:06:09

## 2015-03-11 ENCOUNTER — Encounter: Payer: Medicare Other | Admitting: Internal Medicine

## 2015-03-11 DIAGNOSIS — N3041 Irradiation cystitis with hematuria: Secondary | ICD-10-CM | POA: Diagnosis not present

## 2015-03-11 LAB — GLUCOSE, CAPILLARY
GLUCOSE-CAPILLARY: 180 mg/dL — AB (ref 65–99)
GLUCOSE-CAPILLARY: 137 mg/dL — AB (ref 65–99)

## 2015-03-11 NOTE — Progress Notes (Signed)
[name] ([id number]) Visit Report for 03/10/2015 HBO Details [facility] Date of Service: 03/10/2015 8:00 AM Patient Name: H. Patient Account Number: [id number] Medical Record Treating RN: [id number] Number: Other Clinician: Jacqulyn Bath Date of Birth/Sex: 26-Aug-1940 (74 y.o. 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 HBO Treatment Details Treatment Number: 25 Patient Type: Outpatient Chamber Type: Monoplace Chamber Serial #: [id number] Treatment Protocol: 2.0 ATA with 90 minutes oxygen, and no air breaks Treatment Details Compression Rate Down: 1.5 psi / minute De-Compression Rate Up: 1.5 psi / minute Air breaks and breathing Compress Tx Pressure periods Decompress Decompress Begins Reached (leave unused spaces Begins Ends blank) Chamber Pressure (ATA) 1 2 - - - - - - 2 1 Clock Time (24 hr) 08:04 08:16 - - - - - - 09:46 09:56 Treatment Length: 112 (minutes) Treatment Segments: 4 Capillary Blood Glucose Pre Capillary Blood Glucose (mg/dl): Post Capillary Blood Glucose (mg/dl): Vital Signs Capillary Blood Glucose Reference Range: 80 - 120 mg / dl HBO Diabetic Blood Glucose Intervention Range: <131 mg/dl or >249 mg/dl Time Vitals Blood Respiratory Capillary Blood Glucose Pulse Action Type: Pulse: Temperature: Taken: Pressure: Rate: Glucose (mg/dl): Meter #: Oximetry (%) Taken: Pre 07:50 158/90 96 18 98 175 1 none Post 10:10 166/82 90 18 98.4 136 1 none [name], [name] ([id number]) Treatment Response Treatment Completion Status: Treatment Completed without Adverse Event Physician Notes No concerns with treatment given HBO  Attestation I certify that I supervised this HBO treatment in accordance with Medicare guidelines. A trained Yes emergency response team is readily available per hospital policies and procedures. Continue HBOT as ordered. Yes Electronic Signature(s) Signed: 03/10/2015 3:38:56 PM By: Linton Ham MD Previous Signature: 03/10/2015 1:51:29 PM Version By: Becky Sax, Sallie RCP, RRT, CHT Entered By: Linton Ham on 03/10/2015 15:35:41 [name] ([id number]) -------------------------------------------------------------------------------- HBO Safety Checklist Details [employer] [facility] Date of Service: 03/10/2015 8:00 AM Patient Name: H. Patient Account Number: [id number] Medical Record Treating RN: [id number] Number: Other Clinician: Jacqulyn Bath Date of Birth/Sex: [birth year] (74 y.o. Male) Treating ROBSON, MICHAEL Primary Care Physician/Extender: G POLITE, RONALD Physician: Referring Physician: POLITE, RONALD Weeks in Treatment: 6 HBO Safety Checklist Items Safety Checklist Consent Form Signed Patient voided / foley secured and emptied When did you last eato 7:00 am Last dose of injectable or oral agent n/a NA Ostomy pouch emptied and vented if applicable NA All implantable devices assessed, documented and approved NA Intravenous access site secured and place Valuables secured Linens and cotton and cotton/polyester blend (less than 51% polyester) Personal oil-based products / skin lotions / body lotions removed Wigs or hairpieces removed Smoking or tobacco materials removed Books / newspapers / magazines / loose paper removed Cologne, aftershave, perfume and deodorant removed Jewelry removed (may wrap wedding band) Make-up removed Hair care products removed Battery operated devices (external) removed Heating patches and chemical warmers removed NA Titanium eyewear removed NA Nail polish cured greater than 10 hours NA Casting material cured greater than  10 hours NA Hearing aids removed NA Loose dentures or partials removed NA Prosthetics have been removed Patient demonstrates correct use of air break device (if applicable) Patient concerns have been addressed Patient grounding bracelet on and cord attached to chamber Specifics for Inpatients (complete in addition to above) Medication sheet sent  with patient [name] ([id number]) Intravenous medications needed or due during therapy sent with patient Drainage tubes (e.g. nasogastric tube or chest tube secured and vented) Endotracheal or Tracheotomy tube secured Cuff deflated of air and inflated with saline Airway suctioned Electronic Signature(s) Signed: 03/10/2015 1:51:29 PM By: Lorine Bears RCP, RRT, CHT Entered By: Becky Sax, Amado Nash on 03/10/2015 08:07:05

## 2015-03-12 ENCOUNTER — Encounter: Payer: Medicare Other | Admitting: Surgery

## 2015-03-12 DIAGNOSIS — N3041 Irradiation cystitis with hematuria: Secondary | ICD-10-CM | POA: Diagnosis not present

## 2015-03-12 LAB — GLUCOSE, CAPILLARY
Glucose-Capillary: 175 mg/dL — ABNORMAL HIGH (ref 65–99)
Glucose-Capillary: 161 mg/dL — ABNORMAL HIGH (ref 65–99)
Glucose-Capillary: 56 mg/dL — ABNORMAL LOW (ref 65–99)
Glucose-Capillary: 92 mg/dL (ref 65–99)

## 2015-03-12 NOTE — Progress Notes (Signed)
Jeremiah, [name] ([id number]) Visit Report for 03/11/2015 Arrival Information Details Jeremiah, [name] Date of Service: 03/11/2015 8:00 AM Patient Name: H. Patient Account Number: [id number] Medical Record Treating RN: [id number] Number: Other Clinician: Jacqulyn Bath Date of Birth/Sex: [birth year] (75 y.o. Male) Treating Jeremiah, [name] Primary Care Physician/Extender: G POLITE, RONALD Physician: Referring Physician: POLITE, RONALD Weeks in Treatment: 6 Visit Information History Since Last Visit Added or deleted any medications: No Patient Arrived: Ambulatory Any new allergies or adverse reactions: No Arrival Time: 07:47 Had a fall or experienced change in No Accompanied By: family activities of daily living that may affect member risk of falls: Transfer Assistance: None Signs or symptoms of abuse/neglect since last No Patient Identification Verified: Yes visito Secondary Verification Process Yes Hospitalized since last visit: No Completed: Pain Present Now: No Patient Requires Transmission- No Based Precautions: Patient Has Alerts: No Electronic Signature(s) Signed: 03/11/2015 3:48:19 PM By: Lorine Bears RCP, RRT, CHT Entered By: Becky Sax, Amado Nash on 03/11/2015 08:16:39 [name] ([id number]) -------------------------------------------------------------------------------- Encounter Discharge Information Details [name] Date of Service: 03/11/2015 8:00 AM Patient Name: H. Patient Account Number: [id number] Medical Record Treating RN: [id number] Number: Other Clinician: Jacqulyn Bath Date of Birth/Sex: Sep 27, 1940 (74 y.o. Male) Treating [name] Primary Care Physician/Extender: Rea College, RONALD Physician: Referring Physician: POLITE, RONALD Weeks in Treatment: 6 Encounter Discharge Information Items Discharge Pain Level: 0 Discharge Condition: Stable Ambulatory Status: Ambulatory Discharge Destination:  Home Transportation: Private Auto family Accompanied By: member Schedule Follow-up Appointment: No Medication Reconciliation completed No and provided to Patient/Care [name]: Clinical Summary of Care: Notes Patient has an HBO treatment scheduled on2/9/17 at 08:00 am. Electronic Signature(s) Signed: 03/11/2015 3:48:19 PM By: Lorine Bears RCP, RRT, CHT Entered By: Lorine Bears on 03/11/2015 10:26:38 [name], [name] ([id number]) -------------------------------------------------------------------------------- Vitals Details [name] Date of Service: 03/11/2015 8:00 AM Patient Name: H. Patient Account Number: [id number] Medical Record Treating RN: [id number] Number: Other Clinician: Jacqulyn Bath Date of Birth/Sex: [birth year] (74 y.o. Male) Treating [name] Primary Care Physician/Extender: G POLITE, RONALD Physician: Referring Physician: POLITE, RONALD Weeks in Treatment: 6 Vital Signs Time Taken: 07:49 Temperature (F): 98.5 Height (in): 70 Pulse (bpm): 90 Weight (lbs): 243 Respiratory Rate (breaths/min): 18 Body Mass Index (BMI): 34.9 Blood Pressure (mmHg): 152/78 Capillary Blood Glucose (mg/dl): 180 Reference Range: 80 - 120 mg / dl Electronic Signature(s) Signed: 03/11/2015 3:48:19 PM By: Lorine Bears RCP, RRT, CHT Entered By: Lorine Bears on 03/11/2015 08:17:10

## 2015-03-12 NOTE — Progress Notes (Signed)
[name], [name] ([id number]) Visit Report for 03/11/2015 HBO Details Marvel Plan Date of Service: 03/11/2015 8:00 AM Patient Name: H. Patient Account Number: [id number] Medical Record Treating RN: [id number] Number: Other Clinician: Jacqulyn Bath Date of Birth/Sex: 07-31-1940 (74 y.o. 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 HBO Treatment Details Treatment Number: 26 Patient Type: Outpatient Chamber Type: Monoplace Chamber Serial #: [id number] Treatment Protocol: 2.0 ATA with 90 minutes oxygen, and no air breaks Treatment Details Compression Rate Down: 1.5 psi / minute De-Compression Rate Up: 1.5 psi / minute Air breaks and breathing Compress Tx Pressure periods Decompress Decompress Begins Reached (leave unused spaces Begins Ends blank) Chamber Pressure (ATA) 1 2 - - - - - - 2 1 Clock Time (24 hr) 08:03 08:15 - - - - - - 09:45 09:55 Treatment Length: 112 (minutes) Treatment Segments: 4 Capillary Blood Glucose Pre Capillary Blood Glucose (mg/dl): Post Capillary Blood Glucose (mg/dl): Vital Signs Capillary Blood Glucose Reference Range: 80 - 120 mg / dl HBO Diabetic Blood Glucose Intervention Range: <131 mg/dl or >249 mg/dl Time Vitals Blood Respiratory Capillary Blood Glucose Pulse Action Type: Pulse: Temperature: Taken: Pressure: Rate: Glucose (mg/dl): Meter #: Oximetry (%) Taken: Pre 07:49 152/78 90 18 98.5 180 1 none Post 10:12 142/70 84 18 98.3 137 1 none [name], [name] ([id number]) Treatment Response Treatment Completion Status: Treatment Completed without Adverse Event Physician Notes No concerns with treatment given HBO  Attestation I certify that I supervised this HBO treatment in accordance with Medicare guidelines. A trained Yes emergency response team is readily available per hospital policies and procedures. Continue HBOT as ordered. Yes Electronic Signature(s) Signed: 03/11/2015 5:51:40 PM By: Linton Ham MD Previous Signature: 03/11/2015 3:48:19 PM Version By: Becky Sax, Sallie RCP, RRT, CHT Entered By: Linton Ham on 03/11/2015 17:47:20 [name] ([id number]) -------------------------------------------------------------------------------- HBO Safety Checklist Details [facility] Plan Date of Service: 03/11/2015 8:00 AM Patient Name: H. Patient Account Number: [id number] Medical Record Treating RN: [id number] Number: Other Clinician: Jacqulyn Bath Date of Birth/Sex: [birth year] (74 y.o. Male) Treating ROBSON, MICHAEL Primary Care Physician/Extender: G POLITE, RONALD Physician: Referring Physician: POLITE, RONALD Weeks in Treatment: 6 HBO Safety Checklist Items Safety Checklist Consent Form Signed Patient voided / foley secured and emptied When did you last eato 06:30 am Last dose of injectable or oral agent 06:30 am NA Ostomy pouch emptied and vented if applicable NA All implantable devices assessed, documented and approved NA Intravenous access site secured and place Valuables secured Linens and cotton and cotton/polyester blend (less than 51% polyester) Personal oil-based products / skin lotions / body lotions removed Wigs or hairpieces removed Smoking or tobacco materials removed Books / newspapers / magazines / loose paper removed Cologne, aftershave, perfume and deodorant removed Jewelry removed (may wrap wedding band) Make-up removed Hair care products removed Battery operated devices (external) removed Heating patches and chemical warmers removed NA Titanium eyewear removed NA Nail polish cured greater than 10 hours NA Casting material cured  greater than 10 hours NA Hearing aids removed NA Loose dentures or partials removed NA Prosthetics have been removed Patient demonstrates correct use of air break device (if applicable) Patient concerns have been addressed Patient grounding bracelet on and cord attached to chamber Specifics for Inpatients (complete in addition to above) Medication sheet  sent with patient Jeremiah, [name] ([id number]) Intravenous medications needed or due during therapy sent with patient Drainage tubes (e.g. nasogastric tube or chest tube secured and vented) Endotracheal or Tracheotomy tube secured Cuff deflated of air and inflated with saline Airway suctioned Electronic Signature(s) Signed: 03/11/2015 3:48:19 PM By: Lorine Bears RCP, RRT, CHT Entered By: Lorine Bears on 03/11/2015 08:17:55

## 2015-03-13 ENCOUNTER — Encounter: Payer: Medicare Other | Admitting: Surgery

## 2015-03-13 DIAGNOSIS — N3041 Irradiation cystitis with hematuria: Secondary | ICD-10-CM | POA: Diagnosis not present

## 2015-03-13 LAB — GLUCOSE, CAPILLARY
Glucose-Capillary: 158 mg/dL — ABNORMAL HIGH (ref 65–99)
Glucose-Capillary: 119 mg/dL — ABNORMAL HIGH (ref 65–99)

## 2015-03-13 NOTE — Progress Notes (Signed)
KELDAN, EPLIN ([id number]) Visit Report for 03/12/2015 Arrival Information Details Patient Name: [name], [name]. Date of Service: 03/12/2015 8:00 AM Medical Record Number: [id number] Patient Account Number: [id number] Date of Birth/Sex: [birth year] (75 y.o. Male) Treating RN: Primary Care Physician: POLITE, RONALD Other Clinician: Jacqulyn Bath Referring Physician: POLITE, RONALD Treating Physician/Extender: Frann Rider in Treatment: 6 Visit Information History Since Last Visit Added or deleted any medications: No Patient Arrived: Ambulatory Any new allergies or adverse reactions: No Arrival Time: 07:45 Had a fall or experienced change in No Accompanied By: family activities of daily living that may affect member risk of falls: Transfer Assistance: None Hospitalized since last visit: No Patient Identification Verified: Yes Pain Present Now: No Secondary Verification Process Yes Completed: Patient Requires Transmission- No Based Precautions: Patient Has Alerts: No Electronic Signature(s) Signed: 03/12/2015 4:54:13 PM By: Lorine Bears RCP, RRT, CHT Entered By: Lorine Bears on 03/12/2015 08:11:09 [name] ([id number]) -------------------------------------------------------------------------------- Encounter Discharge Information Details Patient Name: [name] Date of Service: 03/12/2015 8:00 AM Medical Record Number: [id number] Patient Account Number: [id number] Date of Birth/Sex: 07/07/1940 (75 y.o. Male) Treating RN: Primary Care Physician: POLITE, RONALD Other Clinician: Jacqulyn Bath Referring Physician: POLITE, RONALD Treating Physician/Extender: Frann Rider in Treatment: 6 Encounter Discharge Information Items Discharge Pain Level: 0 Discharge Condition: Stable Ambulatory Status: Ambulatory Discharge Destination: Home Transportation: Private Auto family Accompanied By: member Schedule  Follow-up Appointment: No Medication Reconciliation completed No and provided to Patient/Care [name]: Clinical Summary of Care: Notes Patient has an HBO treatment scheduled on 03/13/15 at 08:00 am. Electronic Signature(s) Signed: 03/12/2015 4:54:13 PM By: Lorine Bears RCP, RRT, CHT Entered By: Lorine Bears on 03/12/2015 10:36:58 [name] ([id number]) -------------------------------------------------------------------------------- Vitals Details Patient Name: [name] Date of Service: 03/12/2015 8:00 AM Medical Record Number: [id number] Patient Account Number: [id number] Date of Birth/Sex: [birth year] (75 y.o. Male) Treating RN: Primary Care Physician: POLITE, RONALD Other Clinician: Jacqulyn Bath Referring Physician: POLITE, RONALD Treating Physician/Extender: Frann Rider in Treatment: 6 Vital Signs Time Taken: 07:51 Temperature (F): 98.4 Height (in): 70 Pulse (bpm): 96 Weight (lbs): 243 Respiratory Rate (breaths/min): 18 Body Mass Index (BMI): 34.9 Blood Pressure (mmHg): 162/84 Capillary Blood Glucose (mg/dl): 161 Reference Range: 80 - 120 mg / dl Electronic Signature(s) Signed: 03/12/2015 4:54:13 PM By: Lorine Bears RCP, RRT, CHT Entered By: Lorine Bears on 03/12/2015 08:15:20

## 2015-03-13 NOTE — Progress Notes (Signed)
DRAYKE, GRABEL ([id number]) Visit Report for 03/12/2015 HBO Details Patient Name: [name], [name]. Date of Service: 03/12/2015 8:00 AM Medical Record Number: [id number] Patient Account Number: [id number] Date of Birth/Sex: September 06, 1940 (75 y.o. 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 HBO Treatment Details Treatment Number: 27 Patient Type: Outpatient Chamber Type: Monoplace Chamber Serial #: [id number] Treatment Protocol: 2.0 ATA with 90 minutes oxygen, and no air breaks Treatment Details Compression Rate Down: 1.5 psi / minute De-Compression Rate Up: 1.5 psi / minute Air breaks and breathing Compress Tx Pressure periods Decompress Decompress Begins Reached (leave unused spaces Begins Ends blank) Chamber Pressure (ATA) 1 2 - - - - - - 2 1 Clock Time (24 hr) 08:05 08:17 - - - - - - 09:47 09:57 Treatment Length: 112 (minutes) Treatment Segments: 4 Capillary Blood Glucose Pre Capillary Blood Glucose (mg/dl): Post Capillary Blood Glucose (mg/dl): Vital Signs Capillary Blood Glucose Reference Range: 80 - 120 mg / dl HBO Diabetic Blood Glucose Intervention Range: <131 mg/dl or >249 mg/dl Capillary Time Pulse Blood Respiratory Blood Glucose Action Type: Vitals Pulse: Temperature: Oximetry Pressure: Rate: Glucose Meter #: Taken: Taken: (%) (mg/dl): Pre 07:51 162/84 96 18 98.4 161 1 none Post 10:02 142/78 96 18 97.2 60 1 MD notified and patient given Ensure MD notified - patient ok to leave and Post 10:33 92 1 go eat KHARSON, RASMUSSON H. ([id number]) Treatment Response Treatment Completion Status: Treatment  Completed without Adverse Event HBO Attestation I certify that I supervised this HBO treatment in accordance with Medicare guidelines. A trained Yes emergency response team is readily available per hospital policies and procedures. Continue HBOT as ordered. Yes Electronic Signature(s) Signed: 03/12/2015 11:38:41 AM By: Christin Fudge MD, FACS Entered By: Christin Fudge on 03/12/2015 11:38:41 JAKOREY, MCCONATHY ([id number]) -------------------------------------------------------------------------------- HBO Safety Checklist Details Patient Name: [name] Date of Service: 03/12/2015 8:00 AM Medical Record Number: [id number] Patient Account Number: [id number] Date of Birth/Sex: 06-15-1940 (75 y.o. Male) Treating RN: Primary Care Physician: POLITE, RONALD Other Clinician: Jacqulyn Bath Referring Physician: POLITE, RONALD Treating Physician/Extender: Frann Rider in Treatment: 6 HBO Safety Checklist Items Safety Checklist Consent Form Signed Patient voided / foley secured and emptied When did you last eato 06:30 am Last dose of injectable or oral agent 06:30 am NA Ostomy pouch emptied and vented if applicable NA All implantable devices assessed, documented and approved NA Intravenous access site secured and place Valuables secured Linens and cotton and cotton/polyester blend (less than 51% polyester) Personal oil-based products / skin lotions / body lotions removed Wigs or hairpieces removed Smoking or tobacco materials removed Books / newspapers / magazines / loose paper removed Cologne, aftershave, perfume and deodorant removed Jewelry removed (may wrap wedding band) Make-up removed Hair care products removed Battery operated devices (external) removed Heating patches and chemical warmers removed NA Titanium eyewear removed NA Nail polish cured greater than 10 hours NA Casting material cured greater than 10 hours NA Hearing aids removed NA Loose dentures or  partials removed NA Prosthetics have been removed Patient demonstrates correct use of air break device (if applicable) Patient concerns have been addressed Patient grounding bracelet on and cord attached to chamber Specifics for Inpatients (complete in addition to above) Medication sheet sent with  patient Intravenous medications needed or due during therapy sent with patient DEWAUN, KINZLER ([id number]) Drainage tubes (e.g. nasogastric tube or chest tube secured and vented) Endotracheal or Tracheotomy tube secured Cuff deflated of air and inflated with saline Airway suctioned Electronic Signature(s) Signed: 03/12/2015 4:54:13 PM By: Lorine Bears RCP, RRT, CHT Entered By: Becky Sax, Amado Nash on 03/12/2015 08:16:09

## 2015-03-14 NOTE — Progress Notes (Signed)
[name], [name] ([id number]) Visit Report for 03/13/2015 HBO Details Patient Name: [name], [name]. Date of Service: 03/13/2015 8:00 AM Medical Record Number: [id number] Patient Account Number: [id number] Date of Birth/Sex: [birth year] (75 y.o. 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 HBO Treatment Details Treatment Number: 28 Patient Type: Outpatient Chamber Type: Monoplace Chamber Serial #: [id number] Treatment Protocol: 2.0 ATA with 90 minutes oxygen, and no air breaks Treatment Details Compression Rate Down: 1.5 psi / minute De-Compression Rate Up: 1.5 psi / minute Air breaks and breathing Compress Tx Pressure periods Decompress Decompress Begins Reached (leave unused spaces Begins Ends blank) Chamber Pressure (ATA) 1 2 - - - - - - 2 1 Clock Time (24 hr) 08:34 08:46 - - - - - - 10:17 10:27 Treatment Length: 113 (minutes) Treatment Segments: 4 Capillary Blood Glucose Pre Capillary Blood Glucose (mg/dl): Post Capillary Blood Glucose (mg/dl): Vital Signs Capillary Blood Glucose Reference Range: 80 - 120 mg / dl HBO Diabetic Blood Glucose Intervention Range: <131 mg/dl or >249 mg/dl Time Vitals Blood Respiratory Capillary Blood Glucose Pulse Action Type: Pulse: Temperature: Taken: Pressure: Rate: Glucose (mg/dl): Meter #: Oximetry (%) Taken: Pre 07:56 172/78 96 18 98.1 123 1 Gave Ensure per protocol Pre 08:32 158 proceed with treatment per protocol Post 10:31 142/72 84 18 97.8 119 1 none Treatment Response [name], [name] ([id number]) Treatment Completion Status: Treatment Completed without Adverse Event HBO  Attestation I certify that I supervised this HBO treatment in accordance with Medicare guidelines. A trained Yes emergency response team is readily available per hospital policies and procedures. Continue HBOT as ordered. Yes Electronic Signature(s) Signed: 03/13/2015 11:07:16 AM By: Christin Fudge MD, FACS Entered By: Christin Fudge on 03/13/2015 11:07:15 [name] ([id number]) -------------------------------------------------------------------------------- HBO Safety Checklist Details Patient Name: [name] Date of Service: 03/13/2015 8:00 AM Medical Record Number: [id number] Patient Account Number: [id number] Date of Birth/Sex: April 19, 1940 (75 y.o. Male) Treating RN: Primary Care Physician: POLITE, RONALD Other Clinician: Jacqulyn Bath Referring Physician: POLITE, RONALD Treating Physician/Extender: Frann Rider in Treatment: 6 HBO Safety Checklist Items Safety Checklist Consent Form Signed Patient voided / foley secured and emptied When did you last eato 06:30 am Last dose of injectable or oral agent 06:30 am NA Ostomy pouch emptied and vented if applicable NA All implantable devices assessed, documented and approved NA Intravenous access site secured and place Valuables secured Linens and cotton and cotton/polyester blend (less than 51% polyester) Personal oil-based products / skin lotions / body lotions removed Wigs or hairpieces removed Smoking or tobacco materials removed Books / newspapers / magazines / loose paper removed Cologne, aftershave, perfume and deodorant removed Jewelry removed (may wrap wedding band) Make-up removed Hair care products removed Battery operated devices (external) removed Heating patches and chemical warmers removed NA Titanium eyewear removed NA Nail polish cured greater than 10 hours NA Casting material cured greater than 10 hours NA Hearing aids removed NA Loose dentures or partials removed NA Prosthetics  have been removed Patient demonstrates correct use of air break device (if applicable) Patient concerns have been addressed Patient grounding bracelet on and cord attached to chamber Specifics for Inpatients (complete in addition to above) Medication sheet sent with patient Intravenous medications needed or due during therapy  sent with patient [name], [name] ([id number]) Drainage tubes (e.g. nasogastric tube or chest tube secured and vented) Endotracheal or Tracheotomy tube secured Cuff deflated of air and inflated with saline Airway suctioned Electronic Signature(s) Signed: 03/13/2015 3:20:39 PM By: Lorine Bears RCP, RRT, CHT Entered By: Becky Sax, Amado Nash on 03/13/2015 08:67:61

## 2015-03-14 NOTE — Progress Notes (Signed)
NADER, BOYS ([id number]) Visit Report for 03/13/2015 Arrival Information Details Patient Name: [name], [name]. Date of Service: 03/13/2015 8:00 AM Medical Record Number: [id number] Patient Account Number: [id number] Date of Birth/Sex: Feb 04, 1940 (75 y.o. Male) Treating RN: Primary Care Physician: POLITE, RONALD Other Clinician: Jacqulyn Bath Referring Physician: POLITE, RONALD Treating Physician/Extender: Frann Rider in Treatment: 6 Visit Information History Since Last Visit Added or deleted any medications: No Patient Arrived: Ambulatory Any new allergies or adverse reactions: No Arrival Time: 07:55 Had a fall or experienced change in No Accompanied By: family activities of daily living that may affect member risk of falls: Transfer Assistance: None Signs or symptoms of abuse/neglect since last No Patient Identification Verified: Yes visito Secondary Verification Process Yes Pain Present Now: No Completed: Patient Requires Transmission- No Based Precautions: Patient Has Alerts: No Electronic Signature(s) Signed: 03/13/2015 3:20:39 PM By: Lorine Bears RCP, RRT, CHT Entered By: Lorine Bears on 03/13/2015 08:20:06 [name] ([id number]) -------------------------------------------------------------------------------- Encounter Discharge Information Details Patient Name: [name] Date of Service: 03/13/2015 8:00 AM Medical Record Number: [id number] Patient Account Number: [id number] Date of Birth/Sex: [birth year] (74 y.o. Male) Treating RN: Primary Care Physician: POLITE, RONALD Other Clinician: Jacqulyn Bath Referring Physician: POLITE, RONALD Treating Physician/Extender: Frann Rider in Treatment: 6 Encounter Discharge Information Items Discharge Pain Level: 0 Discharge Condition: Stable Ambulatory Status: Ambulatory Discharge Destination: Home Transportation: Private  Auto family Accompanied By: member Schedule Follow-up Appointment: No Medication Reconciliation completed No and provided to Patient/Care Dreyden Rohrman: Clinical Summary of Care: Notes Patient has an HBO treatment scheduled on 03/16/15 at 08:00 am. Electronic Signature(s) Signed: 03/13/2015 3:20:39 PM By: Lorine Bears RCP, RRT, CHT Entered By: Lorine Bears on 03/13/2015 10:50:03 [name] ([id number]) -------------------------------------------------------------------------------- Vitals Details Patient Name: [name] Date of Service: 03/13/2015 8:00 AM Medical Record Number: [id number] Patient Account Number: [id number] Date of Birth/Sex: July 05, 1940 (75 y.o. Male) Treating RN: Primary Care Physician: POLITE, RONALD Other Clinician: Jacqulyn Bath Referring Physician: POLITE, RONALD Treating Physician/Extender: Frann Rider in Treatment: 6 Vital Signs Time Taken: 07:56 Temperature (F): 98.1 Height (in): 70 Pulse (bpm): 96 Weight (lbs): 243 Respiratory Rate (breaths/min): 18 Body Mass Index (BMI): 34.9 Blood Pressure (mmHg): 172/78 Capillary Blood Glucose (mg/dl): 123 Reference Range: 80 - 120 mg / dl Electronic Signature(s) Signed: 03/13/2015 3:20:39 PM By: Lorine Bears RCP, RRT, CHT Entered By: Lorine Bears on 03/13/2015 08:20:37

## 2015-03-16 ENCOUNTER — Encounter: Payer: Medicare Other | Admitting: Surgery

## 2015-03-16 DIAGNOSIS — N3041 Irradiation cystitis with hematuria: Secondary | ICD-10-CM | POA: Diagnosis not present

## 2015-03-16 LAB — GLUCOSE, CAPILLARY
GLUCOSE-CAPILLARY: 112 mg/dL — AB (ref 65–99)
Glucose-Capillary: 141 mg/dL — ABNORMAL HIGH (ref 65–99)
Glucose-Capillary: 96 mg/dL (ref 65–99)

## 2015-03-17 ENCOUNTER — Encounter: Payer: Medicare Other | Admitting: Internal Medicine

## 2015-03-17 ENCOUNTER — Encounter (HOSPITAL_COMMUNITY): Payer: Self-pay | Admitting: Emergency Medicine

## 2015-03-17 ENCOUNTER — Inpatient Hospital Stay (HOSPITAL_COMMUNITY)
Admission: EM | Admit: 2015-03-17 | Discharge: 2015-03-20 | DRG: 699 | Disposition: A | Discharge status: Home / Self Care | Payer: Medicare Other | Attending: Internal Medicine | Admitting: Internal Medicine

## 2015-03-17 DIAGNOSIS — Z803 Family history of malignant neoplasm of breast: Secondary | ICD-10-CM | POA: Diagnosis not present

## 2015-03-17 DIAGNOSIS — R5383 Other fatigue: Secondary | ICD-10-CM | POA: Diagnosis present

## 2015-03-17 DIAGNOSIS — D500 Iron deficiency anemia secondary to blood loss (chronic): Secondary | ICD-10-CM | POA: Diagnosis not present

## 2015-03-17 DIAGNOSIS — R600 Localized edema: Secondary | ICD-10-CM | POA: Diagnosis present

## 2015-03-17 DIAGNOSIS — Z801 Family history of malignant neoplasm of trachea, bronchus and lung: Secondary | ICD-10-CM

## 2015-03-17 DIAGNOSIS — Z7982 Long term (current) use of aspirin: Secondary | ICD-10-CM

## 2015-03-17 DIAGNOSIS — Z87891 Personal history of nicotine dependence: Secondary | ICD-10-CM | POA: Diagnosis not present

## 2015-03-17 DIAGNOSIS — R310 Gross hematuria: Secondary | ICD-10-CM | POA: Diagnosis present

## 2015-03-17 DIAGNOSIS — N3041 Irradiation cystitis with hematuria: Secondary | ICD-10-CM | POA: Diagnosis present

## 2015-03-17 DIAGNOSIS — Z79899 Other long term (current) drug therapy: Secondary | ICD-10-CM

## 2015-03-17 DIAGNOSIS — M79609 Pain in unspecified limb: Secondary | ICD-10-CM | POA: Diagnosis not present

## 2015-03-17 DIAGNOSIS — E119 Type 2 diabetes mellitus without complications: Secondary | ICD-10-CM | POA: Diagnosis present

## 2015-03-17 DIAGNOSIS — M79669 Pain in unspecified lower leg: Secondary | ICD-10-CM

## 2015-03-17 DIAGNOSIS — C61 Malignant neoplasm of prostate: Secondary | ICD-10-CM | POA: Diagnosis present

## 2015-03-17 DIAGNOSIS — Z8042 Family history of malignant neoplasm of prostate: Secondary | ICD-10-CM | POA: Diagnosis not present

## 2015-03-17 DIAGNOSIS — M1990 Unspecified osteoarthritis, unspecified site: Secondary | ICD-10-CM | POA: Diagnosis present

## 2015-03-17 DIAGNOSIS — Z8249 Family history of ischemic heart disease and other diseases of the circulatory system: Secondary | ICD-10-CM | POA: Diagnosis not present

## 2015-03-17 DIAGNOSIS — I10 Essential (primary) hypertension: Secondary | ICD-10-CM | POA: Diagnosis not present

## 2015-03-17 DIAGNOSIS — K219 Gastro-esophageal reflux disease without esophagitis: Secondary | ICD-10-CM | POA: Diagnosis present

## 2015-03-17 DIAGNOSIS — D62 Acute posthemorrhagic anemia: Secondary | ICD-10-CM | POA: Diagnosis present

## 2015-03-17 DIAGNOSIS — Z794 Long term (current) use of insulin: Secondary | ICD-10-CM | POA: Diagnosis not present

## 2015-03-17 DIAGNOSIS — Y842 Radiological procedure and radiotherapy as the cause of abnormal reaction of the patient, or of later complication, without mention of misadventure at the time of the procedure: Secondary | ICD-10-CM | POA: Diagnosis present

## 2015-03-17 DIAGNOSIS — E1100 Type 2 diabetes mellitus with hyperosmolarity without nonketotic hyperglycemic-hyperosmolar coma (NKHHC): Secondary | ICD-10-CM

## 2015-03-17 DIAGNOSIS — Z7984 Long term (current) use of oral hypoglycemic drugs: Secondary | ICD-10-CM | POA: Diagnosis not present

## 2015-03-17 DIAGNOSIS — E785 Hyperlipidemia, unspecified: Secondary | ICD-10-CM | POA: Diagnosis present

## 2015-03-17 DIAGNOSIS — M109 Gout, unspecified: Secondary | ICD-10-CM | POA: Diagnosis not present

## 2015-03-17 DIAGNOSIS — R319 Hematuria, unspecified: Secondary | ICD-10-CM

## 2015-03-17 DIAGNOSIS — Z8546 Personal history of malignant neoplasm of prostate: Secondary | ICD-10-CM

## 2015-03-17 DIAGNOSIS — N3289 Other specified disorders of bladder: Secondary | ICD-10-CM

## 2015-03-17 LAB — GLUCOSE, CAPILLARY
GLUCOSE-CAPILLARY: 179 mg/dL — AB (ref 65–99)
Glucose-Capillary: 97 mg/dL (ref 65–99)

## 2015-03-17 LAB — BASIC METABOLIC PANEL
ANION GAP: 11 (ref 5–15)
BUN: 26 mg/dL — ABNORMAL HIGH (ref 6–20)
CHLORIDE: 106 mmol/L (ref 101–111)
CO2: 22 mmol/L (ref 22–32)
Calcium: 9.2 mg/dL (ref 8.9–10.3)
Creatinine, Ser: 0.97 mg/dL (ref 0.61–1.24)
GFR calc Af Amer: >60 mL/min (ref >60)
GLUCOSE: 211 mg/dL — AB (ref 65–99)
POTASSIUM: 4.4 mmol/L (ref 3.5–5.1)
SODIUM: 139 mmol/L (ref 135–145)

## 2015-03-17 LAB — URINE MICROSCOPIC-ADD ON

## 2015-03-17 LAB — HEPATIC FUNCTION PANEL
ALBUMIN: 4 g/dL (ref 3.5–5.0)
ALK PHOS: 55 U/L (ref 38–126)
ALT: 19 U/L (ref 17–63)
AST: 22 U/L (ref 15–41)
Bilirubin, Direct: <0.1 mg/dL — ABNORMAL LOW (ref 0.1–0.5)
TOTAL PROTEIN: 6.7 g/dL (ref 6.5–8.1)
Total Bilirubin: 0.4 mg/dL (ref 0.3–1.2)

## 2015-03-17 LAB — APTT: aPTT: 33 seconds (ref 24–37)

## 2015-03-17 LAB — URINALYSIS, ROUTINE W REFLEX MICROSCOPIC
BILIRUBIN URINE: NEGATIVE
KETONES UR: NEGATIVE mg/dL
Nitrite: NEGATIVE
PH: 5.5 (ref 5.0–8.0)
Protein, ur: 100 mg/dL — AB
SPECIFIC GRAVITY, URINE: 1.018 (ref 1.005–1.030)

## 2015-03-17 LAB — PREPARE RBC (CROSSMATCH)

## 2015-03-17 LAB — PROTIME-INR
INR: 1.05 (ref 0.00–1.49)
Prothrombin Time: 13.9 seconds (ref 11.6–15.2)

## 2015-03-17 LAB — POC OCCULT BLOOD, ED: FECAL OCCULT BLD: NEGATIVE

## 2015-03-17 LAB — CBC
HCT: 14.4 % — ABNORMAL LOW (ref 39.0–52.0)
Hemoglobin: 4.1 g/dL — CL (ref 13.0–17.0)
MCH: 18.8 pg — AB (ref 26.0–34.0)
MCHC: 28.5 g/dL — AB (ref 30.0–36.0)
MCV: 66.1 fL — AB (ref 78.0–100.0)
PLATELETS: 212 10*3/uL (ref 150–400)
RBC: 2.18 MIL/uL — ABNORMAL LOW (ref 4.22–5.81)
RDW: 20.2 % — ABNORMAL HIGH (ref 11.5–15.5)
WBC: 4.1 10*3/uL (ref 4.0–10.5)

## 2015-03-17 MED ORDER — SODIUM CHLORIDE 0.9 % IV SOLN: 10.0000 mL/h | Freq: Once | INTRAVENOUS | Status: DC

## 2015-03-17 MED ORDER — DIPHENHYDRAMINE HCL 25 MG PO CAPS
25.0000 mg | ORAL_CAPSULE | Freq: Once | ORAL | Status: AC
  Administered 2015-03-17: 25 mg via ORAL
  Filled 2015-03-17: qty 1

## 2015-03-17 MED ORDER — LORATADINE 10 MG PO TABS
10.0000 mg | ORAL_TABLET | Freq: Every day | ORAL | Status: DC
  Administered 2015-03-17 – 2015-03-20 (×4): 10 mg via ORAL
  Filled 2015-03-17 (×4): qty 1

## 2015-03-17 MED ORDER — ONDANSETRON HCL 4 MG/2ML IJ SOLN: 4.0000 mg | Freq: Four times a day (QID) | INTRAMUSCULAR | Status: DC | PRN

## 2015-03-17 MED ORDER — VITAMIN B-12 100 MCG PO TABS
100.0000 ug | ORAL_TABLET | Freq: Every day | ORAL | Status: DC
  Administered 2015-03-18: 100 ug via ORAL
  Filled 2015-03-17: qty 1

## 2015-03-17 MED ORDER — PANTOPRAZOLE SODIUM 40 MG PO TBEC
40.0000 mg | DELAYED_RELEASE_TABLET | Freq: Every day | ORAL | Status: DC
  Administered 2015-03-17 – 2015-03-20 (×4): 40 mg via ORAL
  Filled 2015-03-17 (×4): qty 1

## 2015-03-17 MED ORDER — SODIUM CHLORIDE 0.9 % IV SOLN: 250.0000 mL | INTRAVENOUS | Status: DC | PRN

## 2015-03-17 MED ORDER — HYDROCODONE-ACETAMINOPHEN 5-325 MG PO TABS
1.0000 | ORAL_TABLET | ORAL | Status: DC | PRN
  Administered 2015-03-18: 1 via ORAL
  Filled 2015-03-17: qty 1

## 2015-03-17 MED ORDER — ATORVASTATIN CALCIUM 40 MG PO TABS
40.0000 mg | ORAL_TABLET | Freq: Every morning | ORAL | Status: DC
  Administered 2015-03-18 – 2015-03-20 (×3): 40 mg via ORAL
  Filled 2015-03-17 (×3): qty 1

## 2015-03-17 MED ORDER — FLUTICASONE PROPIONATE 50 MCG/ACT NA SUSP
1.0000 | Freq: Every day | NASAL | Status: DC
  Administered 2015-03-18 – 2015-03-20 (×3): 1 via NASAL
  Filled 2015-03-17: qty 16

## 2015-03-17 MED ORDER — SENNOSIDES-DOCUSATE SODIUM 8.6-50 MG PO TABS
1.0000 | ORAL_TABLET | Freq: Every evening | ORAL | Status: DC | PRN
Start: 2015-03-17 — End: 2015-03-20

## 2015-03-17 MED ORDER — SODIUM CHLORIDE 0.9 % IV SOLN
Freq: Once | INTRAVENOUS | Status: AC
  Administered 2015-03-17: 22:00:00 via INTRAVENOUS

## 2015-03-17 MED ORDER — ACETAMINOPHEN 325 MG PO TABS: 650.0000 mg | ORAL_TABLET | Freq: Four times a day (QID) | ORAL | Status: DC | PRN

## 2015-03-17 MED ORDER — ACETAMINOPHEN 650 MG RE SUPP: 650.0000 mg | Freq: Four times a day (QID) | RECTAL | Status: DC | PRN

## 2015-03-17 MED ORDER — ONDANSETRON HCL 4 MG PO TABS: 4.0000 mg | ORAL_TABLET | Freq: Four times a day (QID) | ORAL | Status: DC | PRN

## 2015-03-17 MED ORDER — SODIUM CHLORIDE 0.9% FLUSH
3.0000 mL | Freq: Two times a day (BID) | INTRAVENOUS | Status: DC
  Administered 2015-03-17 – 2015-03-19 (×4): 3 mL via INTRAVENOUS

## 2015-03-17 MED ORDER — FUROSEMIDE 10 MG/ML IJ SOLN
20.0000 mg | Freq: Once | INTRAMUSCULAR | Status: AC
  Administered 2015-03-17: 20 mg via INTRAVENOUS
  Filled 2015-03-17: qty 4

## 2015-03-17 MED ORDER — OMEGA 3 1000 MG PO CAPS
1.0000 | ORAL_CAPSULE | Freq: Every day | ORAL | Status: DC
  Administered 2015-03-18 – 2015-03-20 (×3): 1000 mg via ORAL
  Filled 2015-03-17 (×3): qty 1

## 2015-03-17 MED ORDER — ACETAMINOPHEN 325 MG PO TABS
650.0000 mg | ORAL_TABLET | Freq: Once | ORAL | Status: AC
  Administered 2015-03-17: 650 mg via ORAL
  Filled 2015-03-17: qty 2

## 2015-03-17 MED ORDER — BISACODYL 5 MG PO TBEC
5.0000 mg | DELAYED_RELEASE_TABLET | Freq: Every day | ORAL | Status: DC | PRN
  Filled 2015-03-17: qty 1

## 2015-03-17 MED ORDER — VITAMIN D 1000 UNITS PO TABS
1000.0000 [IU] | ORAL_TABLET | Freq: Every day | ORAL | Status: DC
  Administered 2015-03-18 – 2015-03-20 (×3): 1000 [IU] via ORAL
  Filled 2015-03-17 (×3): qty 1

## 2015-03-17 MED ORDER — SODIUM CHLORIDE 0.9% FLUSH
3.0000 mL | Freq: Two times a day (BID) | INTRAVENOUS | Status: DC
  Administered 2015-03-17 – 2015-03-19 (×3): 3 mL via INTRAVENOUS

## 2015-03-17 MED ORDER — SODIUM CHLORIDE 0.9% FLUSH: 3.0000 mL | INTRAVENOUS | Status: DC | PRN

## 2015-03-17 NOTE — ED Notes (Signed)
Pt urinating blood, hx of same. Seen at PCP today and found Hgb to be 4. Pt presents very pale and weak.

## 2015-03-17 NOTE — ED Notes (Signed)
Pt is aware a urine sample is needed urinal at bedside.

## 2015-03-17 NOTE — ED Provider Notes (Signed)
CSN: [id number]     Arrival date & time 03/17/15  1832 History   First MD Initiated Contact with Patient 03/17/15 1904     Chief Complaint  Patient presents with  . HgB 4.0   . Hematuria     (Consider location/radiation/quality/duration/timing/severity/associated sxs/prior Treatment) The history is provided by the patient, medical records and the spouse.    75 year old male with history of hypertension, diabetes, GERD, hyperlipidemia, prostate cancer status post radioactive seed implantation, presenting to the ED for low hemoglobin. Patient has been having intermittent issues since his radiation therapy for his prostate cancer.  He was admitted in November for low hemoglobin and hematuria as well. He underwent cystoscopy with clot evacuation and fulguration of bladder neck in December 2016 with his urologist, Dr. Karsten Ro.  Since this time, bleeding has slowed but he has continued having hematuria. The past few days he has been somewhat weak with low energy. He denies any syncopal events. Patient was seen by his PCP today for routine blood work and was notified shortly after leaving the appointment that his hemoglobin had come back at 4.0.  Patient did require blood transfusion with his prior admission in November, willing to do this again.  He denies any abdominal pain, nausea, vomiting, diarrhea.  No melena or hematochezia.  Vital signs stable.  Past Medical History  Diagnosis Date  . Prostate cancer ([place]) 03/2013    had seed implant and radiation for 5 weeks  . Hyperlipidemia   . Hypertension   . Type 2 diabetes mellitus ([place])   . GERD (gastroesophageal reflux disease)   . Wears glasses   . Arthritis    Past Surgical History  Procedure Laterality Date  . Prostate biopsy    . Inguinal hernia repair Left 2005  . Repair chronic incarcerated recurrent left inguinal hernia  10-23-2008  . Colonoscopy  2010  . Radioactive seed implant N/A 07/04/2013    Procedure: RADIOACTIVE SEED IMPLANT;   Surgeon: Claybon Jabs, MD;  Location: [facility];  Service: Urology;  Laterality: N/A;  . Cystoscopy N/A 07/04/2013    Procedure: CYSTOSCOPY FLEXIBLE;  Surgeon: Claybon Jabs, MD;  Location:  [facility];  Service: Urology;  Laterality: N/A;  . Transurethral resection of bladder tumor N/A 01/02/2015    Procedure: Cystoscopy clot evalucation and fulgration;  Surgeon: Kathie Rhodes, MD;  Location: [facility];  Service: Urology;  Laterality: N/A;   Family History  Problem Relation Age of Onset  . Cancer Mother     ovarian  . Heart disease Father   . Cancer Sister     breast  . Breast cancer Sister   . Cancer Brother     lung  . Cancer Brother     prostate  . Prostate cancer Brother   . Breast cancer Sister   . Lung cancer Brother   . Lung cancer Brother    Social History  Substance Use Topics  . Smoking status: Former Smoker -- 1.00 packs/day for 30 years    Types: Cigarettes    Quit date: 02/01/2008  . Smokeless tobacco: Never Used  . Alcohol Use: No    Review of Systems  Hematological: Bruises/bleeds easily.       Anemia  All other systems reviewed and are negative.     Allergies  Review of patient's allergies indicates no known allergies.  Home Medications   Prior to Admission medications   Medication Sig Start Date End Date Taking? Authorizing Provider  aspirin  81 MG tablet Take 81 mg by mouth daily.    Historical Provider, MD  atorvastatin (LIPITOR) 40 MG tablet Take 40 mg by mouth every morning.     Historical Provider, MD  Cholecalciferol (VITAMIN D3) 1000 UNITS CAPS Take 1 capsule by mouth daily.    Historical Provider, MD  ciprofloxacin (CIPRO) 500 MG tablet Take 1 tablet (500 mg total) by mouth 2 (two) times daily. 01/04/15   Florencia Reasons, MD  cyanocobalamin 100 MCG tablet Take 100 mcg by mouth daily.    Historical Provider, MD  fexofenadine (ALLEGRA) 180 MG tablet Take 180 mg by mouth as needed for allergies or rhinitis.    Historical  Provider, MD  fluconazole (DIFLUCAN) 100 MG tablet Take 1 tablet (100 mg total) by mouth daily. 01/04/15   Florencia Reasons, MD  Fluticasone Propionate (FLONASE NA) Place 1 spray into the nose as needed (allergies).     Historical Provider, MD  guaiFENesin (MUCINEX) 600 MG 12 hr tablet Take 1,200 mg by mouth 2 (two) times daily as needed for cough or to loosen phlegm.     Historical Provider, MD  insulin regular (NOVOLIN R,HUMULIN R) 100 units/mL injection Inject 0.2 mLs (20 Units total) into the skin 2 (two) times daily before a meal. 01/04/15   Florencia Reasons, MD  metFORMIN (GLUCOPHAGE) 1000 MG tablet Take 1,000 mg by mouth 2 (two) times daily with a meal.  02/28/13   Historical Provider, MD  Omega 3 1000 MG CAPS Take 1 capsule by mouth daily.    Historical Provider, MD  omeprazole (PRILOSEC) 20 MG capsule Take 20 mg by mouth daily as needed (heartburn).     Historical Provider, MD  ramipril (ALTACE) 10 MG capsule Take 10 mg by mouth every morning.     Historical Provider, MD   BP 125/53 mmHg  Pulse 92  Temp(Src) 98.6 F (37 C) (Oral)  Resp 16  SpO2 100%   Physical Exam  Constitutional: He is oriented to person, place, and time. He appears well-developed and well-nourished.  Appears pale, generally weak  HENT:  Head: Normocephalic and atraumatic.  Mouth/Throat: Oropharynx is clear and moist.  Eyes: Conjunctivae and EOM are normal. Pupils are equal, round, and reactive to light.  Conjunctiva pale  Neck: Normal range of motion.  Cardiovascular: Normal rate, regular rhythm and normal heart sounds.   Pulmonary/Chest: Effort normal and breath sounds normal. No respiratory distress.  Abdominal: Soft. Bowel sounds are normal.  Genitourinary: Rectum normal. Rectal exam shows no internal hemorrhoid and no tenderness. Guaiac negative stool.  Wearing adult brief that is saturated with blood tinged urine, small clot present in brief; small amount of blood surrounding urethral meatus, no signs of trauma Rectum  appears normal, no gross blood, guaiac negative  Musculoskeletal: Normal range of motion.  Neurological: He is alert and oriented to person, place, and time.  Skin: Skin is warm and dry.  Psychiatric: He has a normal mood and affect.  Nursing note and vitals reviewed.   ED Course  Procedures (including critical care time)  CRITICAL CARE Performed by: Larene Pickett   Total critical care time: 45 minutes  Critical care time was exclusive of separately billable procedures and treating other patients.  Critical care was necessary to treat or prevent imminent or life-threatening deterioration.  Critical care was time spent personally by me on the following activities: development of treatment plan with patient and/or surrogate as well as nursing, discussions with consultants, evaluation of patient's response to treatment,  examination of patient, obtaining history from patient or surrogate, ordering and performing treatments and interventions, ordering and review of laboratory studies, ordering and review of radiographic studies, pulse oximetry and re-evaluation of patient's condition.   Labs Review Labs Reviewed  URINALYSIS, ROUTINE W REFLEX MICROSCOPIC (NOT AT [facility]) - Abnormal; Notable for the following:    Color, Urine AMBER (*)    APPearance CLOUDY (*)    Glucose, UA >1000 (*)    Hgb urine dipstick LARGE (*)    Protein, ur 100 (*)    Leukocytes, UA SMALL (*)    All other components within normal limits  CBC - Abnormal; Notable for the following:    RBC 2.18 (*)    Hemoglobin 4.1 (*)    HCT 14.4 (*)    MCV 66.1 (*)    MCH 18.8 (*)    MCHC 28.5 (*)    RDW 20.2 (*)    All other components within normal limits  BASIC METABOLIC PANEL - Abnormal; Notable for the following:    Glucose, Bld 211 (*)    BUN 26 (*)    All other components within normal limits  HEPATIC FUNCTION PANEL - Abnormal; Notable for the following:    Bilirubin, Direct <0.1 (*)    All other components  within normal limits  URINE MICROSCOPIC-ADD ON - Abnormal; Notable for the following:    Squamous Epithelial / LPF 0-5 (*)    Bacteria, UA FEW (*)    All other components within normal limits  URINE CULTURE  PROTIME-INR  APTT  BASIC METABOLIC PANEL  POC OCCULT BLOOD, ED  TYPE AND SCREEN  PREPARE RBC (CROSSMATCH)  TYPE AND SCREEN  PREPARE RBC (CROSSMATCH)    Imaging Review No results found. I have personally reviewed and evaluated these images and lab results as part of my medical decision-making.   EKG Interpretation None      MDM   Final diagnoses:  Acute blood loss anemia  Hematuria   75 year old male here with low hemoglobin. He has had ongoing hematuria following radioactive seed placement for his prostate cancer. She underwent procedure in December with his urologist, Dr. Karsten Ro, which slowed bleeding.  Patient is awake, alert, vital signs are stable. His adult brief was soaked with blood tinged urine and there was small clot present in brief.  He has evidence of mild bleeding surrounding urethral meatus, no evidence of new trauma. No gross blood on rectal exam, guaiac negative. Hemoglobin here is confirmed at 4.1.  Patient and wife understand risk/benefit of transfusion and elected to proceed.  2 units PRBC's ordered.  Patient will need admission.  Remains hemodynamically stable here.  Case discussed with Dr. Myna Hidalgo, will admit to telemetry bed.  Temp admit orders placed.  Larene Pickett, PA-C 03/17/15 2313  Gareth Morgan, MD 03/18/15 [phone]

## 2015-03-17 NOTE — ED Notes (Signed)
Bed: LG49 Expected date:  Expected time:  Means of arrival:  Comments: TR 9

## 2015-03-17 NOTE — H&P (Signed)
[facility] History and Physical  [name] GGE:[id number] DOB: 1940/12/13 DOA: 03/17/2015  Referring physician: ED physician PCP: Kandice Hams, MD  Specialists: Dr. Karsten Ro (urology), Dr. Con Memos (wound care), Dr. Tammi Klippel (radiation oncology), Dr. Olevia Perches (gastroenterology)   Chief Complaint:  [name] hematuria, fatigue  HPI: [name] is a 75 y.o. 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 He was walking to the mailbox and taking out the garbage, but has been essentially chair-bound recently due to fatigue.  Patient was admitted in November 2016 under very similar circumstances and had cystoscopy with bladder neck fulguration performed by Dr. Karsten Ro. He required 8 units of pRBCs during that admission. His wife was concerned that his current illness is secondary to recurrent anemia and asked the patient's endocrinologist to order a CBC. Shortly after the lab draw, patient was notified by the endocrinologist that hemoglobin was 4 and that the patient should proceed directly to an emergency department. Patient notes that after the recent discharge on 01/04/2015, there was approximately 2 weeks without gross hematuria and only rare small clots. Since the new year however, patient reports persistence of gross hematuria, describing a light pink color to his urine and occasional passage of small clots. He denies any pain or difficulty voiding.  He denies fever, chills, or dysuria. There is been no diarrhea, constipation, melena, or hematochezia. The patient is not anticoagulated but takes a baby aspirin daily. He has not followed up with his urologist since his discharge in December 2016.   In ED, patient was found to be afebrile,  saturating well on room air, and with vital signs stable.  CMP featured a serum glucose of 211 but otherwise unremarkable. CBC features hemoglobin of 4.1 with MCV of 66.1.  DRE was performed and FOBT negative. 2 units of packed red blood cells were ordered for immediate transfusion from the emergency department, the patient remained hemodynamically stable, and will be admitted to telemetry for ongoing evaluation and management of blood loss anemia secondary to hematuria.  Where does patient live?   At home    Can patient participate in ADLs?  Yes    Review of Systems:   General: no fevers, chills, sweats, weight change, or poor appetite. Fatigue.  HEENT: no blurry vision, hearing changes or sore throat Pulm: no dyspnea, cough, or wheeze CV: no chest pain or palpitations Abd: no nausea, vomiting, abdominal pain, diarrhea, or constipation GU: no dysuria, increased urinary frequency, or urgency. Hematuria.  Ext: Improving leg edema b/l Neuro: no focal weakness, numbness, or tingling, no vision change or hearing loss Skin: no rash, no wounds MSK: No muscle spasm, no deformity, no red, hot, or swollen joint Heme: No easy bruising or bleeding aside from hematuria Travel history: No recent long distant travel    Allergy: No Known Allergies  Past Medical History  Diagnosis Date  . Prostate cancer ([place]) 03/2013    had seed implant and radiation for 5 weeks  . Hyperlipidemia   . Hypertension   . Type 2 diabetes mellitus ([place])   . GERD (gastroesophageal reflux disease)   . Wears glasses   . Arthritis     Past Surgical History  Procedure Laterality Date  . Prostate biopsy    . Inguinal hernia repair Left 2005  . Repair chronic  incarcerated recurrent left inguinal hernia  10-23-2008  . Colonoscopy  2010  . Radioactive seed implant N/A 07/04/2013    Procedure: RADIOACTIVE SEED IMPLANT;  Surgeon: Claybon Jabs, MD;  Location: [facility];  Service: Urology;  Laterality: N/A;   . Cystoscopy N/A 07/04/2013    Procedure: CYSTOSCOPY FLEXIBLE;  Surgeon: Claybon Jabs, MD;  Location: [facility];  Service: Urology;  Laterality: N/A;  . Transurethral resection of bladder tumor N/A 01/02/2015    Procedure: Cystoscopy clot evalucation and fulgration;  Surgeon: Kathie Rhodes, MD;  Location: [facility];  Service: Urology;  Laterality: N/A;    Social History:  reports that he quit smoking about 7 years ago. His smoking use included Cigarettes. He has a 30 pack-year smoking history. He has never used smokeless tobacco. He reports that he does not drink alcohol or use illicit drugs.  Family History:  Family History  Problem Relation Age of Onset  . Cancer Mother     ovarian  . Heart disease Father   . Cancer Sister     breast  . Breast cancer Sister   . Cancer Brother     lung  . Cancer Brother     prostate  . Prostate cancer Brother   . Breast cancer Sister   . Lung cancer Brother   . Lung cancer Brother      Prior to Admission medications   Medication Sig Start Date End Date Taking? Authorizing Provider  aspirin 81 MG tablet Take 81 mg by mouth daily.   Yes Historical Provider, MD  atorvastatin (LIPITOR) 40 MG tablet Take 40 mg by mouth every morning.    Yes Historical Provider, MD  cetirizine (ZYRTEC) 10 MG tablet Take 10 mg by mouth daily as needed for allergies.   Yes Historical Provider, MD  Cholecalciferol (VITAMIN D3) 1000 UNITS CAPS Take 1 capsule by mouth daily.   Yes Historical Provider, MD  cyanocobalamin 100 MCG tablet Take 100 mcg by mouth daily.   Yes Historical Provider, MD  Fluticasone Propionate (FLONASE NA) Place 1 spray into the nose daily.    Yes Historical Provider, MD  furosemide (LASIX) 20 MG tablet Take 20 mg by mouth daily.   Yes Historical Provider, MD  insulin regular (NOVOLIN R,HUMULIN R) 100 units/mL injection Inject 0.2 mLs (20 Units total) into the skin 2 (two) times daily before a meal. 01/04/15  Yes Florencia Reasons, MD  metFORMIN  (GLUCOPHAGE) 1000 MG tablet Take 1,000 mg by mouth 2 (two) times daily with a meal.  02/28/13  Yes Historical Provider, MD  Omega 3 1000 MG CAPS Take 1 capsule by mouth daily.   Yes Historical Provider, MD  potassium chloride SA (K-DUR,KLOR-CON) 20 MEQ tablet Take 20 mEq by mouth daily.   Yes Historical Provider, MD  ramipril (ALTACE) 10 MG capsule Take 10 mg by mouth every morning.    Yes Historical Provider, MD  ciprofloxacin (CIPRO) 500 MG tablet Take 1 tablet (500 mg total) by mouth 2 (two) times daily. Patient not taking: Reported on 03/17/2015 01/04/15   Florencia Reasons, MD  fluconazole (DIFLUCAN) 100 MG tablet Take 1 tablet (100 mg total) by mouth daily. Patient not taking: Reported on 03/17/2015 01/04/15   Florencia Reasons, MD  guaiFENesin (MUCINEX) 600 MG 12 hr tablet Take 1,200 mg by mouth 2 (two) times daily as needed for cough or to loosen phlegm.     Historical Provider, MD  losartan (COZAAR) 25 MG tablet Take 25 mg  by mouth daily.  03/17/15   Historical Provider, MD  omeprazole (PRILOSEC) 20 MG capsule Take 20 mg by mouth daily as needed (heartburn).     Historical Provider, MD    Physical Exam: Filed Vitals:   03/17/15 1942 03/17/15 2000 03/17/15 2036 03/17/15 2108  BP: 150/59 148/79 124/47 127/52  Pulse: 90 90 94 91  Temp:      TempSrc:      Resp: '18  17 18  '$ SpO2: 100% 100% 100% 100%   General: Not in acute distress. Pale HEENT:       Eyes: PERRL, EOMI, no scleral icterus. Conjunctiva are pale.        ENT: No discharge from the ears or nose, no pharyngeal ulcers, petechiae or exudate, no tonsillar enlargement.        Neck: No JVD, no bruit, no appreciable mass Heme: No cervical adenopathy, pallor Cardiac: S1/S2, RRR, No murmurs, No gallops or rubs. Pulm: Good air movement bilaterally. No rales, wheezing, rhonchi or rubs. Abd: Soft, nondistended, nontender, no rebound pain or gaurding, no mass or organomegaly, BS present. Ext:  2+DP/PT pulse bilaterally. Bilateral LE pitting edema to  knees Musculoskeletal: No gross deformity, no red, hot, swollen joints Skin: No rashes or wounds on exposed surfaces  Neuro: Alert, oriented X3, cranial nerves II-XII grossly intact. No focal findings Psych: Patient is not overtly psychotic, appropriate mood and affect.  Labs on Admission:  Basic Metabolic Panel:  Recent Labs Lab 03/17/15 1946  NA 139  K 4.4  CL 106  CO2 22  GLUCOSE 211*  BUN 26*  CREATININE 0.97  CALCIUM 9.2   Liver Function Tests:  Recent Labs Lab 03/17/15 1946  AST 22  ALT 19  ALKPHOS 55  BILITOT 0.4  PROT 6.7  ALBUMIN 4.0   No results for input(s): LIPASE, AMYLASE in the last 168 hours. No results for input(s): AMMONIA in the last 168 hours. CBC:  Recent Labs Lab 03/17/15 1946  WBC 4.1  HGB 4.1*  HCT 14.4*  MCV 66.1*  PLT 212   Cardiac Enzymes: No results for input(s): CKTOTAL, CKMB, CKMBINDEX, TROPONINI in the last 168 hours.  BNP (last 3 results) No results for input(s): BNP in the last 8760 hours.  ProBNP (last 3 results) No results for input(s): PROBNP in the last 8760 hours.  CBG:  Recent Labs Lab 03/16/15 0752 03/16/15 0827 03/16/15 1027 03/17/15 0824 03/17/15 1029  GLUCAP 112* 141* 96 179* 97    Radiological Exams on Admission: No results found.  EKG:  Not done in ED, will obtain as appropriate   Assessment/Plan  1. Blood loss anemia  - Hgb 4.1 on admission, MCV is 66  - Suspect this was a slow decline in Hgb over the last 2 months, now exceeding his compensatory capacity  - Urine is pale pink with only occasional small clot and no suggestion of obstruction  - 2 units pRBCs ordered from ED for immediate transfusion, will ask blood bank to put 2 more hold  - Check post-transfusion H/H - 20 mg IV Lasix between transfusions given clinical vol o/l  - As bleeding persists, but seems to be quite slow at this time, will plan to transfuse to a goal of 7.5 overnight  - If bleeding persists, may require inpatient  urology consultation  - Hold ASA 81, no pharmacologic VTE ppx   2. 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  Ottelin, per report, there was diffuse involvement of hemorrhagic cystitis - Urine is light pink currently with no urinary retention or dysuria  - Will initiate CBI if needed for increased bleeding with clots  - Transfusing as above   3. 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 Type II DM  - Hold home metformin while inpt  - CBG with meals and qHS  - Low-intensity SSI correctional prn  - Carb-modified diet when appropriate  - A1c pending, was 6.5% on 01/01/15, demonstrating excellent control   5. Hypertension - BP running low at this point - Hold home meds (losartan, ramipril)  - Cautious resumption of BP meds once hemodynamically stable      DVT ppx:  SCDs  Code Status: Full code Family Communication:  Yes, patient's wife at bed side Disposition Plan: Admit to inpatient   Date of Service 03/17/2015    Vianne Bulls, MD [facility] Pager [phone]  If 7PM-7AM, please contact night-coverage [email or web address] Password [facility] 03/17/2015, 9:34 PM

## 2015-03-17 NOTE — Progress Notes (Signed)
DARRIE, MACMILLAN ([id number]) Visit Report for 03/16/2015 HBO Details Patient Name: [name], [name]. Date of Service: 03/16/2015 8:00 AM Medical Record Number: [id number] Patient Account Number: [id number] Date of Birth/Sex: 1940/07/29 (75 y.o. 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 HBO Treatment Details Treatment Number: 29 Patient Type: Outpatient Chamber Type: Monoplace Chamber Serial #: [id number] Treatment Protocol: 2.0 ATA with 90 minutes oxygen, and no air breaks Treatment Details Compression Rate Down: 1.5 psi / minute De-Compression Rate Up: 1.5 psi / minute Air breaks and breathing Compress Tx Pressure periods Decompress Decompress Begins Reached (leave unused spaces Begins Ends blank) Chamber Pressure (ATA) 1 2 - - - - - - 2 1 Clock Time (24 hr) 08:29 08:41 - - - - - - 10:12 10:22 Treatment Length: 113 (minutes) Treatment Segments: 4 Capillary Blood Glucose Pre Capillary Blood Glucose (mg/dl): Post Capillary Blood Glucose (mg/dl): Vital Signs Capillary Blood Glucose Reference Range: 80 - 120 mg / dl HBO Diabetic Blood Glucose Intervention Range: <131 mg/dl or >249 mg/dl Capillary Time Pulse Blood Respiratory Blood Glucose Action Type: Vitals Pulse: Temperature: Oximetry Pressure: Rate: Glucose Meter #: Taken: Taken: (%) (mg/dl): Pre 07:52 142/58 90 18 98.3 112 1 Ensure given per protocol Pre 08:27 141 Proceeded with treatment per protocol Per MD make sure pt. will eat when he Post 10:27 130/70 96 18 98.4 96 1 gets home [name], [name] ([id number]) Treatment Response Treatment Completion  Status: Treatment Completed without Adverse Event HBO Attestation I certify that I supervised this HBO treatment in accordance with Medicare guidelines. A trained Yes emergency response team is readily available per hospital policies and procedures. Continue HBOT as ordered. Yes Electronic Signature(s) Signed: 03/16/2015 12:58:24 PM By: Christin Fudge MD, FACS Entered By: Christin Fudge on 03/16/2015 12:58:24 [name], [name] ([id number]) -------------------------------------------------------------------------------- HBO Safety Checklist Details Patient Name: [name] Date of Service: 03/16/2015 8:00 AM Medical Record Number: [id number] Patient Account Number: [id number] Date of Birth/Sex: 07/09/1940 (75 y.o. Male) Treating RN: Primary Care Physician: POLITE, RONALD Other Clinician: Jacqulyn Bath Referring Physician: POLITE, RONALD Treating Physician/Extender: Frann Rider in Treatment: 6 HBO Safety Checklist Items Safety Checklist Consent Form Signed Patient voided / foley secured and emptied When did you last eato 06:30 am Last dose of injectable or oral agent 06:30 am NA Ostomy pouch emptied and vented if applicable NA All implantable devices assessed, documented and approved NA Intravenous access site secured and place Valuables secured Linens and cotton and cotton/polyester blend (less than 51% polyester) Personal oil-based products / skin lotions / body lotions removed Wigs or hairpieces removed Smoking or tobacco materials removed Books / newspapers / magazines / loose paper removed Cologne, aftershave, perfume and deodorant removed Jewelry removed (may wrap wedding band) Make-up removed Hair care products removed Battery operated devices (external) removed Heating patches and chemical warmers removed NA Titanium eyewear removed NA Nail polish cured greater than 10 hours NA Casting material cured greater than 10 hours NA Hearing aids  removed NA Loose dentures or partials removed NA Prosthetics have been removed Patient demonstrates correct use of air break device (if applicable) Patient concerns have been addressed Patient grounding bracelet on and cord attached to chamber Specifics for Inpatients (complete in addition to above) Medication sheet  sent with patient Intravenous medications needed or due during therapy sent with patient [name], [name] ([id number]) Drainage tubes (e.g. nasogastric tube or chest tube secured and vented) Endotracheal or Tracheotomy tube secured Cuff deflated of air and inflated with saline Airway suctioned Electronic Signature(s) Signed: 03/16/2015 5:41:23 PM By: Lorine Bears RCP, RRT, CHT Entered By: Becky Sax, Amado Nash on 03/16/2015 08:34:08

## 2015-03-17 NOTE — Progress Notes (Signed)
EDRIC, FETTERMAN ([id number]) Visit Report for 03/16/2015 Arrival Information Details Patient Name: [name], [name]. Date of Service: 03/16/2015 8:00 AM Medical Record Number: [id number] Patient Account Number: [id number] Date of Birth/Sex: 1940/07/22 (75 y.o. Male) Treating RN: Primary Care Physician: POLITE, RONALD Other Clinician: Jacqulyn Bath Referring Physician: POLITE, RONALD Treating Physician/Extender: Frann Rider in Treatment: 6 Visit Information History Since Last Visit Added or deleted any medications: No Patient Arrived: Ambulatory Any new allergies or adverse reactions: No Arrival Time: 07:50 Had a fall or experienced change in No Accompanied By: family activities of daily living that may affect member risk of falls: Transfer Assistance: None Signs or symptoms of abuse/neglect since last No Patient Identification Verified: Yes visito Secondary Verification Process Yes Hospitalized since last visit: No Completed: Pain Present Now: No Patient Requires Transmission- No Based Precautions: Patient Has Alerts: No Electronic Signature(s) Signed: 03/16/2015 5:41:23 PM By: Lorine Bears RCP, RRT, CHT Entered By: Lorine Bears on 03/16/2015 08:33:02 [name] ([id number]) -------------------------------------------------------------------------------- Encounter Discharge Information Details Patient Name: [name] Date of Service: 03/16/2015 8:00 AM Medical Record Number: [id number] Patient Account Number: [id number] Date of Birth/Sex: 12/22/1940 (75 y.o. Male) Treating RN: Primary Care Physician: POLITE, RONALD Other Clinician: Jacqulyn Bath Referring Physician: POLITE, RONALD Treating Physician/Extender: Frann Rider in Treatment: 6 Encounter Discharge Information Items Discharge Pain Level: 0 Discharge Condition: Stable Ambulatory Status: Ambulatory Discharge Destination:  Home Transportation: Private Auto family Accompanied By: member Schedule Follow-up Appointment: No Medication Reconciliation completed No and provided to Patient/Care Havish Petties: Clinical Summary of Care: Notes Patient has an HBO treatment scheduled on 03/17/15 at 08:00 am. Electronic Signature(s) Signed: 03/16/2015 5:41:23 PM By: Lorine Bears RCP, RRT, CHT Entered By: Lorine Bears on 03/16/2015 10:42:51 [name] ([id number]) -------------------------------------------------------------------------------- Vitals Details Patient Name: [name] Date of Service: 03/16/2015 8:00 AM Medical Record Number: [id number] Patient Account Number: [id number] Date of Birth/Sex: Oct 21, 1940 (75 y.o. Male) Treating RN: Primary Care Physician: POLITE, RONALD Other Clinician: Jacqulyn Bath Referring Physician: POLITE, RONALD Treating Physician/Extender: Frann Rider in Treatment: 6 Vital Signs Time Taken: 07:52 Temperature (F): 98.3 Height (in): 70 Pulse (bpm): 90 Weight (lbs): 243 Respiratory Rate (breaths/min): 18 Body Mass Index (BMI): 34.9 Blood Pressure (mmHg): 142/58 Capillary Blood Glucose (mg/dl): 112 Reference Range: 80 - 120 mg / dl Electronic Signature(s) Signed: 03/16/2015 5:41:23 PM By: Lorine Bears RCP, RRT, CHT Entered By: Becky Sax, Amado Nash on 03/16/2015 08:33:29

## 2015-03-18 ENCOUNTER — Inpatient Hospital Stay (HOSPITAL_COMMUNITY): Payer: Medicare Other

## 2015-03-18 ENCOUNTER — Encounter: Payer: BLUE CROSS/BLUE SHIELD | Admitting: Internal Medicine

## 2015-03-18 DIAGNOSIS — C61 Malignant neoplasm of prostate: Secondary | ICD-10-CM

## 2015-03-18 DIAGNOSIS — D62 Acute posthemorrhagic anemia: Secondary | ICD-10-CM

## 2015-03-18 DIAGNOSIS — R310 Gross hematuria: Secondary | ICD-10-CM

## 2015-03-18 DIAGNOSIS — Z794 Long term (current) use of insulin: Secondary | ICD-10-CM

## 2015-03-18 DIAGNOSIS — I10 Essential (primary) hypertension: Secondary | ICD-10-CM

## 2015-03-18 DIAGNOSIS — E118 Type 2 diabetes mellitus with unspecified complications: Secondary | ICD-10-CM

## 2015-03-18 LAB — BASIC METABOLIC PANEL
ANION GAP: 9 (ref 5–15)
BUN: 27 mg/dL — ABNORMAL HIGH (ref 6–20)
CALCIUM: 8.8 mg/dL — AB (ref 8.9–10.3)
CO2: 25 mmol/L (ref 22–32)
Chloride: 107 mmol/L (ref 101–111)
Creatinine, Ser: 0.92 mg/dL (ref 0.61–1.24)
GLUCOSE: 149 mg/dL — AB (ref 65–99)
POTASSIUM: 4.1 mmol/L (ref 3.5–5.1)
Sodium: 141 mmol/L (ref 135–145)

## 2015-03-18 LAB — CBC WITH DIFFERENTIAL/PLATELET
Basophils Absolute: 0 10*3/uL (ref 0.0–0.1)
Basophils Relative: 1 %
EOS ABS: 0.1 10*3/uL (ref 0.0–0.7)
Eosinophils Relative: 2 %
HCT: 21.6 % — ABNORMAL LOW (ref 39.0–52.0)
HEMOGLOBIN: 6.4 g/dL — AB (ref 13.0–17.0)
Lymphocytes Relative: 43 %
Lymphs Abs: 1.8 10*3/uL (ref 0.7–4.0)
MCH: 20.3 pg — ABNORMAL LOW (ref 26.0–34.0)
MCHC: 29.6 g/dL — ABNORMAL LOW (ref 30.0–36.0)
MCV: 68.4 fL — ABNORMAL LOW (ref 78.0–100.0)
MONO ABS: 0.6 10*3/uL (ref 0.1–1.0)
Monocytes Relative: 14 %
NEUTROS PCT: 40 %
Neutro Abs: 1.6 10*3/uL — ABNORMAL LOW (ref 1.7–7.7)
PLATELETS: 214 10*3/uL (ref 150–400)
RBC: 3.16 MIL/uL — AB (ref 4.22–5.81)
RDW: 20.3 % — ABNORMAL HIGH (ref 11.5–15.5)
WBC: 4.1 10*3/uL (ref 4.0–10.5)

## 2015-03-18 LAB — HEMOGLOBIN AND HEMATOCRIT, BLOOD
HEMATOCRIT: 24.7 % — AB (ref 39.0–52.0)
HEMOGLOBIN: 7.5 g/dL — AB (ref 13.0–17.0)

## 2015-03-18 LAB — GLUCOSE, CAPILLARY
GLUCOSE-CAPILLARY: 123 mg/dL — AB (ref 65–99)
GLUCOSE-CAPILLARY: 129 mg/dL — AB (ref 65–99)
GLUCOSE-CAPILLARY: 181 mg/dL — AB (ref 65–99)
Glucose-Capillary: 126 mg/dL — ABNORMAL HIGH (ref 65–99)
Glucose-Capillary: 165 mg/dL — ABNORMAL HIGH (ref 65–99)

## 2015-03-18 LAB — PREPARE RBC (CROSSMATCH)

## 2015-03-18 MED ORDER — OXYCODONE HCL 5 MG PO TABS
5.0000 mg | ORAL_TABLET | Freq: Four times a day (QID) | ORAL | Status: DC | PRN
  Administered 2015-03-18 – 2015-03-19 (×2): 5 mg via ORAL
  Filled 2015-03-18 (×2): qty 1

## 2015-03-18 MED ORDER — TAMSULOSIN HCL 0.4 MG PO CAPS
0.4000 mg | ORAL_CAPSULE | Freq: Every day | ORAL | Status: DC
  Administered 2015-03-18 – 2015-03-20 (×3): 0.4 mg via ORAL
  Filled 2015-03-18 (×3): qty 1

## 2015-03-18 MED ORDER — SODIUM CHLORIDE 0.9 % IV SOLN
Freq: Once | INTRAVENOUS | Status: AC
  Administered 2015-03-18: 10:00:00 via INTRAVENOUS

## 2015-03-18 MED ORDER — INSULIN ASPART 100 UNIT/ML ~~LOC~~ SOLN
0.0000 [IU] | Freq: Three times a day (TID) | SUBCUTANEOUS | Status: DC
  Administered 2015-03-18 – 2015-03-19 (×2): 3 [IU] via SUBCUTANEOUS
  Administered 2015-03-19: 2 [IU] via SUBCUTANEOUS
  Administered 2015-03-19 – 2015-03-20 (×3): 3 [IU] via SUBCUTANEOUS

## 2015-03-18 MED ORDER — PHENAZOPYRIDINE HCL 100 MG PO TABS
100.0000 mg | ORAL_TABLET | Freq: Three times a day (TID) | ORAL | Status: DC
  Administered 2015-03-18 – 2015-03-20 (×7): 100 mg via ORAL
  Filled 2015-03-18 (×11): qty 1

## 2015-03-18 MED ORDER — INSULIN ASPART 100 UNIT/ML ~~LOC~~ SOLN: 0.0000 [IU] | Freq: Every day | SUBCUTANEOUS | Status: DC

## 2015-03-18 NOTE — Progress Notes (Signed)
[name], [name] ([id number]) Visit Report for 03/17/2015 Arrival Information Details [name], [name] Date of Service: 03/17/2015 8:00 AM Patient Name: H. Patient Account Number: [id number] Medical Record Treating RN: [id number] Number: Other Clinician: Jacqulyn Bath Date of Birth/Sex: [birth year] (74 y.o. Male) Treating ROBSON, [place] Primary Care Physician/Extender: G POLITE, RONALD Physician: Referring Physician: POLITE, RONALD Weeks in Treatment: 7 Visit Information History Since Last Visit Added or deleted any medications: No Patient Arrived: Ambulatory Any new allergies or adverse reactions: No Arrival Time: 07:47 Had a fall or experienced change in No Accompanied By: family activities of daily living that may affect member risk of falls: Transfer Assistance: None Signs or symptoms of abuse/neglect since last No Patient Identification Verified: Yes visito Secondary Verification Process Yes Hospitalized since last visit: No Completed: Pain Present Now: No Patient Requires Transmission- No Based Precautions: Patient Has Alerts: No Electronic Signature(s) Signed: 03/17/2015 3:42:54 PM By: Lorine Bears RCP, RRT, CHT Entered By: Becky Sax, Amado Nash on 03/17/2015 08:21:15 [name], [name] ([id number]) -------------------------------------------------------------------------------- Encounter Discharge Information Details Marvel Plan Date of Service: 03/17/2015 8:00 AM Patient Name: H. Patient Account Number: [id number] Medical Record Treating RN: [id number] Number: Other Clinician: Jacqulyn Bath Date of Birth/Sex: 08/01/1940 (74 y.o. Male) Treating ROBSON, MICHAEL Primary Care Physician/Extender: Rea College, RONALD Physician: Referring Physician: POLITE, RONALD Weeks in Treatment: 7 Encounter Discharge Information Items Discharge Pain Level: 0 Discharge Condition: Stable Ambulatory Status: Ambulatory Discharge Destination:  Home Transportation: Private Auto family Accompanied By: member Schedule Follow-up Appointment: No Medication Reconciliation completed No and provided to Patient/Care Bernadine Melecio: Clinical Summary of Care: Notes Patient has an HBO treatment scheduled on 03/18/15 at 08:00 am. Electronic Signature(s) Signed: 03/17/2015 3:42:54 PM By: Lorine Bears RCP, RRT, CHT Entered By: Becky Sax, Amado Nash on 03/17/2015 10:43:32 [name], [name] ([id number]) -------------------------------------------------------------------------------- Vitals Details Marvel Plan Date of Service: 03/17/2015 8:00 AM Patient Name: H. Patient Account Number: [id number] Medical Record Treating RN: [id number] Number: Other Clinician: Jacqulyn Bath Date of Birth/Sex: 27-Feb-1940 (74 y.o. Male) Treating ROBSON, MICHAEL Primary Care Physician/Extender: G POLITE, RONALD Physician: Referring Physician: POLITE, RONALD Weeks in Treatment: 7 Vital Signs Time Taken: 07:50 Temperature (F): 97.7 Height (in): 70 Pulse (bpm): 84 Weight (lbs): 243 Respiratory Rate (breaths/min): 18 Body Mass Index (BMI): 34.9 Blood Pressure (mmHg): 164/68 Capillary Blood Glucose (mg/dl): 129 Reference Range: 80 - 120 mg / dl Electronic Signature(s) Signed: 03/17/2015 3:42:54 PM By: Lorine Bears RCP, RRT, CHT Entered By: Becky Sax, Amado Nash on 03/17/2015 08:21:43

## 2015-03-18 NOTE — Consult Note (Addendum)
Urology Consult  Consulting MD: Berle Mull, M.D.  CC: Hematuria with anemia  HPI: This is a 75 year old male with a history of adenocarcinoma of the prostate. He was treated with combination radiotherapy, with his brachytherapy terminating his radiation in February, 2015. Patient also had androgen deprivation therapy which he has completed. 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 He was admitted to this institution in November, 2016 with gross hematuria. That necessitated cystoscopy and fulguration/cauterization of liters. The patient had approximately 8 units of blood transfused during that hospitalization.  Since that time, the patient has commenced with hyperbaric oxygen treatments for his hemorrhagic cystitis. He has completed approximately 30 of these, with gradual resolution of the majority of his gross hematuria. The patient still has pink urine at times, but most of the time, over the past 2 weeks, the patient has noted no gross hematuria. He occasionally passes some small clots in the initial part of his urinary stream, but has no problem voiding these out. The patient was admitted to the hospital yesterday with significant anemia with dyspnea on exertion. He was found to have a hemoglobin of 4. He has received 3 units of packed red blood cells so far drawn this hospitalization. His urine has been pink, but he denies significant discomfort with urination or feeling of incomplete emptying. He underwent ultrasound of the kidneys and bladder. The ultrasound of the kidneys revealed mild prominence of his calyces, but no significant hydronephrosis. Bladder was somewhat distended, but had no filling defects.  Urologic consultation is requested.  PMH: Past Medical History  Diagnosis Date  . Prostate cancer ([place]) 03/2013    had seed implant and radiation for 5 weeks  . Hyperlipidemia   . Hypertension   . Type 2 diabetes mellitus ([place])   . GERD (gastroesophageal reflux  disease)   . Wears glasses   . Arthritis     PSH: Past Surgical History  Procedure Laterality Date  . Prostate biopsy    . Inguinal hernia repair Left 2005  . Repair chronic incarcerated recurrent left inguinal hernia  10-23-2008  . Colonoscopy  2010  . Radioactive seed implant N/A 07/04/2013    Procedure: RADIOACTIVE SEED IMPLANT;  Surgeon: Claybon Jabs, MD;  Location: [facility];  Service: Urology;  Laterality: N/A;  . Cystoscopy N/A 07/04/2013    Procedure: CYSTOSCOPY FLEXIBLE;  Surgeon: Claybon Jabs, MD;  Location[facility];  Service: Urology;  Laterality: N/A;  . Transurethral resection of bladder tumor N/A 01/02/2015    Procedure: Cystoscopy clot evalucation and fulgration;  Surgeon: Kathie Rhodes, MD;  Location: [facility];  Service: Urology;  Laterality: N/A;    Allergies: No Known Allergies  Medications: Prescriptions prior to admission  Medication Sig Dispense Refill Last Dose  . aspirin 81 MG tablet Take 81 mg by mouth daily.   03/17/2015 at Unknown time  . atorvastatin (LIPITOR) 40 MG tablet Take 40 mg by mouth every morning.    03/17/2015 at Unknown time  . cetirizine (ZYRTEC) 10 MG tablet Take 10 mg by mouth daily as needed for allergies.   03/17/2015 at Unknown time  . Cholecalciferol (VITAMIN D3) 1000 UNITS CAPS Take 1 capsule by mouth daily.   03/17/2015 at Unknown time  . cyanocobalamin 100 MCG tablet Take 100 mcg by mouth daily.   03/17/2015 at Unknown time  . Fluticasone Propionate (FLONASE NA) Place 1 spray into the nose daily.    03/17/2015 at Unknown time  .  furosemide (LASIX) 20 MG tablet Take 20 mg by mouth daily.   03/16/2015 at Unknown time  . insulin regular (NOVOLIN R,HUMULIN R) 100 units/mL injection Inject 0.2 mLs (20 Units total) into the skin 2 (two) times daily before a meal. 10 mL 11 03/17/2015 at Unknown time  . metFORMIN (GLUCOPHAGE) 1000 MG tablet Take 1,000 mg by mouth 2 (two) times daily with a meal.    03/17/2015 at Unknown  time  . Omega 3 1000 MG CAPS Take 1 capsule by mouth daily.   03/17/2015 at Unknown time  . potassium chloride SA (K-DUR,KLOR-CON) 20 MEQ tablet Take 20 mEq by mouth daily.   03/16/2015 at Unknown time  . ramipril (ALTACE) 10 MG capsule Take 10 mg by mouth every morning.    03/17/2015 at 0630  . ciprofloxacin (CIPRO) 500 MG tablet Take 1 tablet (500 mg total) by mouth 2 (two) times daily. (Patient not taking: Reported on 03/17/2015) 4 tablet 0 Completed Course at Unknown time  . fluconazole (DIFLUCAN) 100 MG tablet Take 1 tablet (100 mg total) by mouth daily. (Patient not taking: Reported on 03/17/2015) 2 tablet 0 Completed Course at Unknown time  . guaiFENesin (MUCINEX) 600 MG 12 hr tablet Take 1,200 mg by mouth 2 (two) times daily as needed for cough or to loosen phlegm.    unknown  . losartan (COZAAR) 25 MG tablet Take 25 mg by mouth daily.      Marland Kitchen omeprazole (PRILOSEC) 20 MG capsule Take 20 mg by mouth daily as needed (heartburn).    unknown     Social History: Social History   Social History  . Marital Status: Married    Spouse Name: N/A  . Number of Children: N/A  . Years of Education: N/A   Occupational History  . Not on file.   Social History Main Topics  . Smoking status: Former Smoker -- 1.00 packs/day for 30 years    Types: Cigarettes    Quit date: 02/01/2008  . Smokeless tobacco: Never Used  . Alcohol Use: No  . Drug Use: No  . Sexual Activity: Not on file   Other Topics Concern  . Not on file   Social History Narrative    Family History: Family History  Problem Relation Age of Onset  . Cancer Mother     ovarian  . Heart disease Father   . Cancer Sister     breast  . Breast cancer Sister   . Cancer Brother     lung  . Cancer Brother     prostate  . Prostate cancer Brother   . Breast cancer Sister   . Lung cancer Brother   . Lung cancer Brother     Review of Systems: Positive: Intermittent gross hematuria with small clots. Dyspnea on exertion. Lower  extremity swelling. Fatigue. Negative: .  A further 10 point review of systems was negative except what is listed in the HPI.  Physical Exam: '@VITALS2'$ @ General: No acute distress.  Awake. Head:  Normocephalic.  Atraumatic. ENT:  EOMI.  Mucous membranes moist Neck:  Supple.  No lymphadenopathy. CV:  S1 present. S2 present. Regular rate. Pulmonary: Equal effort bilaterally.  Clear to auscultation bilaterally. Abdomen: Soft. Obese,  non- tender to palpation. Bladder nonpalpable Skin:  Normal turgor.  No visible rash. Extremity: No gross deformity of bilateral upper extremities.  No gross deformity of    bilateral lower extremities. Neurologic: Alert. Appropriate mood.    Studies:  Recent Labs  03/17/15  1946  03/18/15  0826  03/18/15  1508  HGB  4.1*  6.4*  7.5*  WBC  4.1  4.1   --   PLT  212  214   --     Recent Labs     03/17/15  1946  03/18/15  0532  NA  139  141  K  4.4  4.1  CL  106  107  CO2  22  25  BUN  26*  27*  CREATININE  0.97  0.92  CALCIUM  9.2  8.8*  GFRNONAA  >60  >60  GFRAA  >60  >60     Recent Labs     03/17/15  1946  INR  1.05  APTT  33     Invalid input(s): ABG  I reviewed the patient's renal ultrasound images-there dictated above. Current hemoglobin, after 3 units of packed red blood cells is 7.5.  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 The patient has actually had improvement of this over the past 2-3 months, since he had cauterization of bleeders back in late 2016 while admitted here. The patient is undergoing hyperbaric oxygen therapy of his radiation-induced process, and this has improved. I do not think that he has significant ongoing blood loss, although his hemoglobin of 4 is quite striking/unusual. I would expect, with limited bleeding, his hemoglobin to be significantly higher. I would consider other sources of blood loss i.e.:/GI. The patient has not had a colonoscopy in some time. He is not experiencing  hematochezia, however.  Plan: At this point, I do not think the patient needs catheterization/irrigation of his bladder. He seems to be emptying well, he has no significant bladder abnormalities on his recent renal/bladder ultrasound. I do not think he needs intervention i.e. cystoscopy and cauterization either. I would strongly suggest considering other sources of blood loss at this point, especially with his hemoglobin of 4 the time of admission.  I agree with appropriate transfusion. Once the patient discharged he can commence with the remainder of his hyperbaric oxygen treatments.  I will notify Dr. Karsten Ro of the patient's admission.   Cc: Dr. Posey Pronto    Pager:[phone]

## 2015-03-18 NOTE — Progress Notes (Signed)
[facility] Progress Note  Patient: [name] MVH:[id number]   PCP: Kandice Hams, MD DOB: 12-02-1940   DOA: 03/17/2015   DOS: 03/18/2015   Date of Service: the patient was seen and examined on 03/18/2015  Subjective: Patient complains of abdominal pain to the nurse although no complaint of any pain at the time of my evaluation. No nausea no vomiting. Occasional clots primarily light pink urine. No diarrhea. Nutrition: Tolerating oral diet Activity: Ambulating in the room Last BM: 03/17/2015  Assessment and Plan: 1. Acute blood loss anemia Patient presents with complaints of generalized fatigue. Hemoglobin 4.1 on admission. After 2 units improved to 6.4. Currently we will transfuse 1 more unit. 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 We will get ultrasound renal, urology consulted. Monitor H&H. Pyridium for symptomatically treatment. Also add Flomax. Patient may need CBI awaiting urology recommendation.  2. Type 2 diabetes mellitus. Placing the patient on sliding scale insulin. She does not have any prior history of TIA CVA or coronary artery disease or peripheral vascular disease. Therefore at present I would discontinue aspirin which has been probably started for primary prophylaxis.  3. essential hypertension. Continuing Cozaar.  4. GERD. Continue PPI.  DVT Prophylaxis: mechanical compression device Nutrition: Carb modified Advance goals of care discussion: DNR/DNI  Brief Summary of Hospitalization:  HPI: As per the H and P dictated on admission, "[name] is a 75 y.o. 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 He was walking to  the mailbox and taking out the garbage, but has been essentially chair-bound recently due to fatigue. Patient was admitted in November 2016 under very similar circumstances and had cystoscopy with bladder neck fulguration performed by Dr. Karsten Ro. He required 8 units of pRBCs during that admission. His wife was concerned that his current illness is secondary to recurrent anemia and asked the patient's endocrinologist to order a CBC. Shortly after the lab draw, patient was notified by the endocrinologist that hemoglobin was 4 and that the patient should proceed directly to an emergency department. Patient notes that after the recent discharge on 01/04/2015, there was approximately 2 weeks without gross hematuria and only rare small clots. Since the new year however, patient reports persistence of gross hematuria, describing a light pink color to his urine and occasional passage of small clots. He denies any pain or difficulty voiding. He denies fever, chills, or dysuria. There is been no diarrhea, constipation, melena, or hematochezia. The patient is not anticoagulated but takes a baby aspirin daily. He has not followed up with his urologist since his discharge in December 2016.   In ED, patient was found to be afebrile, saturating well on room air, and with vital signs stable. CMP featured a serum glucose of 211 but otherwise unremarkable. CBC features hemoglobin of 4.1 with MCV of 66.1. DRE was performed and FOBT negative. 2 units of packed red blood cells were ordered for immediate transfusion from the emergency department, the patient remained hemodynamically stable, and will be admitted to telemetry for ongoing evaluation and management of blood loss anemia secondary to hematuria." Daily update, Procedures: Ultrasound renal pending Consultants: Urology pending Antibiotics: Anti-infectives    None       Family Communication: family was present  at bedside, at the time of interview.  Opportunity was  given to ask question and all questions were answered satisfactorily.   Disposition:  Expected discharge date: 03/20/2015 Barriers to safe discharge: Improvement and hematuria   Intake/Output Summary (Last 24 hours) at 03/18/15 1430 Last data filed at 03/18/15 1234  Gross per 24 hour  Intake    992 ml  Output     50 ml  Net    942 ml   There were no vitals filed for this visit.  Objective: Physical Exam: Filed Vitals:   03/18/15 0445 03/18/15 1010 03/18/15 1033 03/18/15 1234  BP: 143/57 131/51 155/59 189/80  Pulse: 87 82 86 88  Temp: 98.9 F (37.2 C) 98.6 F (37 C) 97.6 F (36.4 C) 97.7 F (36.5 C)  TempSrc: Oral Oral Oral Oral  Resp: 16     SpO2: 98% 100% 100% 99%     General: Appear in mild distress, no Rash; Oral Mucosa moist. Cardiovascular: S1 and S2 Present, no Murmur, no JVD Respiratory: Bilateral Air entry present and Clear to Auscultation, o Crackles, no wheezes Abdomen: Bowel Sound present, Soft and no tenderness Extremities: no Pedal edema, no calf tenderness Neurology: Grossly no focal neuro deficit.  Data Reviewed: CBC:  Recent Labs Lab 03/17/15 1946 03/18/15 0826  WBC 4.1 4.1  NEUTROABS  --  1.6*  HGB 4.1* 6.4*  HCT 14.4* 21.6*  MCV 66.1* 68.4*  PLT 212 454   Basic Metabolic Panel:  Recent Labs Lab 03/17/15 1946 03/18/15 0532  NA 139 141  K 4.4 4.1  CL 106 107  CO2 22 25  GLUCOSE 211* 149*  BUN 26* 27*  CREATININE 0.97 0.92  CALCIUM 9.2 8.8*   Liver Function Tests:  Recent Labs Lab 03/17/15 1946  AST 22  ALT 19  ALKPHOS 55  BILITOT 0.4  PROT 6.7  ALBUMIN 4.0   No results for input(s): LIPASE, AMYLASE in the last 168 hours. No results for input(s): AMMONIA in the last 168 hours.  Cardiac Enzymes: No results for input(s): CKTOTAL, CKMB, CKMBINDEX, TROPONINI in the last 168 hours.  BNP (last 3 results) No results for input(s): BNP in the last 8760 hours.  CBG:  Recent Labs Lab 03/16/15 0827 03/16/15 1027  03/17/15 0824 03/17/15 1029 03/18/15 0631  GLUCAP 141* 96 179* 97 126*    No results found for this or any previous visit (from the past 240 hour(s)).   Studies: No results found.   Scheduled Meds: . atorvastatin  40 mg Oral q morning - 10a  . cholecalciferol  1,000 Units Oral Daily  . fluticasone  1 spray Each Nare Daily  . loratadine  10 mg Oral Daily  . Omega 3  1 capsule Oral Daily  . pantoprazole  40 mg Oral Daily  . phenazopyridine  100 mg Oral TID WC  . sodium chloride flush  3 mL Intravenous Q12H  . sodium chloride flush  3 mL Intravenous Q12H  . tamsulosin  0.4 mg Oral QPC supper   Continuous Infusions:  PRN Meds: sodium chloride, acetaminophen **OR** acetaminophen, bisacodyl, ondansetron **OR** ondansetron (ZOFRAN) IV, oxyCODONE, senna-docusate, sodium chloride flush  Time spent: 30 minutes  Author: Berle Mull, MD [facility] Pager: [phone] 03/18/2015 2:30 PM  If 7PM-7AM, please contact night-coverage at [email or web address], password [facility]

## 2015-03-18 NOTE — Progress Notes (Signed)
CRITICAL VALUE ALERT  Critical value received:  HGB 6.4  Date of notification:  03/18/15  Time of notification:  0853  Critical value read back:  Yes  Nurse who received alert:  [facility]  MD notified (1st page):  Posey Pronto  Time of first page:  0900  MD notified (2nd page):  Time of second page:  Responding MD:    Time MD responded:

## 2015-03-19 ENCOUNTER — Encounter: Payer: BLUE CROSS/BLUE SHIELD | Admitting: General Surgery

## 2015-03-19 ENCOUNTER — Inpatient Hospital Stay (HOSPITAL_COMMUNITY): Payer: Medicare Other

## 2015-03-19 DIAGNOSIS — R600 Localized edema: Secondary | ICD-10-CM

## 2015-03-19 DIAGNOSIS — M79609 Pain in unspecified limb: Secondary | ICD-10-CM

## 2015-03-19 LAB — BASIC METABOLIC PANEL
ANION GAP: 9 (ref 5–15)
BUN: 19 mg/dL (ref 6–20)
CHLORIDE: 104 mmol/L (ref 101–111)
CO2: 26 mmol/L (ref 22–32)
Calcium: 8.8 mg/dL — ABNORMAL LOW (ref 8.9–10.3)
Creatinine, Ser: 0.75 mg/dL (ref 0.61–1.24)
GFR calc Af Amer: >60 mL/min (ref >60)
GLUCOSE: 160 mg/dL — AB (ref 65–99)
POTASSIUM: 4 mmol/L (ref 3.5–5.1)
SODIUM: 139 mmol/L (ref 135–145)

## 2015-03-19 LAB — CBC
HCT: 23 % — ABNORMAL LOW (ref 39.0–52.0)
HEMOGLOBIN: 6.9 g/dL — AB (ref 13.0–17.0)
MCH: 21 pg — AB (ref 26.0–34.0)
MCHC: 30 g/dL (ref 30.0–36.0)
MCV: 70.1 fL — AB (ref 78.0–100.0)
PLATELETS: 197 10*3/uL (ref 150–400)
RBC: 3.28 MIL/uL — AB (ref 4.22–5.81)
RDW: 21.4 % — ABNORMAL HIGH (ref 11.5–15.5)
WBC: 5.6 10*3/uL (ref 4.0–10.5)

## 2015-03-19 LAB — URINE CULTURE

## 2015-03-19 LAB — HEMOGLOBIN AND HEMATOCRIT, BLOOD
HEMATOCRIT: 26.8 % — AB (ref 39.0–52.0)
HEMOGLOBIN: 8.2 g/dL — AB (ref 13.0–17.0)

## 2015-03-19 LAB — GLUCOSE, CAPILLARY
GLUCOSE-CAPILLARY: 177 mg/dL — AB (ref 65–99)
Glucose-Capillary: 150 mg/dL — ABNORMAL HIGH (ref 65–99)
Glucose-Capillary: 186 mg/dL — ABNORMAL HIGH (ref 65–99)
Glucose-Capillary: 173 mg/dL — ABNORMAL HIGH (ref 65–99)

## 2015-03-19 LAB — PREPARE RBC (CROSSMATCH)

## 2015-03-19 MED ORDER — HYDROCODONE-ACETAMINOPHEN 5-325 MG PO TABS: 2.0000 | ORAL_TABLET | Freq: Once | ORAL | Status: DC

## 2015-03-19 MED ORDER — SODIUM CHLORIDE 0.9 % IV SOLN: Freq: Once | INTRAVENOUS | Status: DC

## 2015-03-19 NOTE — Progress Notes (Signed)
[facility] Progress Note  Patient: [name] NLG:[id number]   PCP: Kandice Hams, MD DOB: Oct 04, 1940   DOA: 03/17/2015   DOS: 03/19/2015   Date of Service: the patient was seen and examined on 03/19/2015  Subjective: Patient complains of calf tenderness especially when he is lying down, long with some increased swelling over last few weeks. Continues to complain of clots passing through urethra. No evidence of bleeding with a bowel movement and no abdominal pain Nutrition: Tolerating oral diet Activity: Ambulating in the room Last BM: 03/18/2015  Assessment and Plan: 1. Acute blood loss anemia Patient presents with complaints of generalized fatigue. Hemoglobin 4.1 on admission. After 2 units improved to 6.4. Currently 8.2. 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 Appreciate urology consult. Recommended to continue close monitoring. While urology has recommended to consider other sources of bleeding at present his hemoglobin remaining stable after blood transfusion and patient denying any GI bleed or any abdominal pain or back pain tenderness and therefore no indication to obtain further workup regarding bleeding. Should the patient becomes hemodynamically stable or becomes symptomatic we will obtain CT abdomen and pelvis to rule out any intra-abdominal pathology. Monitor H&H. Pyridium for symptomatically treatment. Also add Flomax.  2. Type 2 diabetes mellitus. Placing the patient on sliding scale insulin. She does not have any prior history of TIA CVA or coronary artery disease or peripheral vascular disease. Therefore at present I would discontinue aspirin which has been probably started for primary prophylaxis.  3. essential hypertension. Continuing Cozaar.  4. GERD. Continue PPI.  5. Pedal edema with calf tenderness. Given his history of cancer I would get ultrasound lower extremity Doppler to  rule out DVT.  DVT Prophylaxis: mechanical compression device Nutrition: Carb modified Advance goals of care discussion: DNR/DNI  Brief Summary of Hospitalization:  HPI: As per the H and P dictated on admission, "[name] is a 75 y.o. 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 He was walking to the mailbox and taking out the garbage, but has been essentially chair-bound recently due to fatigue. Patient was admitted in November 2016 under very similar circumstances and had cystoscopy with bladder neck fulguration performed by Dr. Karsten Ro. He required 8 units of pRBCs during that admission. His wife was concerned that his current illness is secondary to recurrent anemia and asked the patient's endocrinologist to order a CBC. Shortly after the lab draw, patient was notified by the endocrinologist that hemoglobin was 4 and that the patient should proceed directly to an emergency department. Patient notes that after the recent discharge on 01/04/2015, there was approximately 2 weeks without gross hematuria and only rare small clots. Since the new year however, patient reports persistence of gross hematuria, describing a light pink color to his urine and occasional passage of small clots. He denies any pain or difficulty voiding. He denies fever, chills, or dysuria. There is been no diarrhea, constipation, melena, or hematochezia. The patient is not anticoagulated but takes a baby aspirin daily. He has not followed up with his urologist since his discharge in December 2016.   In ED, patient was found to be afebrile, saturating well on room air, and with vital signs stable. CMP featured a serum glucose  of 211 but otherwise unremarkable. CBC  features hemoglobin of 4.1 with MCV of 66.1. DRE was performed and FOBT negative. 2 units of packed red blood cells were ordered for immediate transfusion from the emergency department, the patient remained hemodynamically stable, and will be admitted to telemetry for ongoing evaluation and management of blood loss anemia secondary to hematuria." Daily update, Procedures: Ultrasound renal  Consultants: Urology  Antibiotics: Anti-infectives    None      Family Communication: family was present at bedside, at the time of interview.  Opportunity was given to ask question and all questions were answered satisfactorily.   Disposition:  Expected discharge date: 03/21/2015 Barriers to safe discharge: Improvement in hematuria   Intake/Output Summary (Last 24 hours) at 03/19/15 1903 Last data filed at 03/19/15 3154  Gross per 24 hour  Intake  307.5 ml  Output      0 ml  Net  307.5 ml   There were no vitals filed for this visit.  Objective: Physical Exam: Filed Vitals:   03/19/15 0741 03/19/15 0800 03/19/15 0952 03/19/15 1351  BP: 133/50 148/63 135/51 147/62  Pulse: 79 85 81 77  Temp: 98.5 F (36.9 C) 98.8 F (37.1 C) 98.7 F (37.1 C) 97.7 F (36.5 C)  TempSrc: Oral Oral Oral Oral  Resp: '16 16 16 18  '$ SpO2: 93% 94% 99% 97%    General: Appear in mild distress, no Rash; Oral Mucosa moist. Cardiovascular: S1 and S2 Present, no Murmur, no JVD Respiratory: Bilateral Air entry present and Clear to Auscultation, o Crackles, no wheezes Abdomen: Bowel Sound present, Soft and no tenderness Extremities: no Pedal edema, no calf tenderness Neurology: Grossly no focal neuro deficit.  Data Reviewed: CBC:  Recent Labs Lab 03/17/15 1946 03/18/15 0826 03/18/15 1508 03/19/15 0525 03/19/15 1319  WBC 4.1 4.1  --  5.6  --   NEUTROABS  --  1.6*  --   --   --   HGB 4.1* 6.4* 7.5* 6.9* 8.2*  HCT 14.4* 21.6* 24.7* 23.0* 26.8*  MCV 66.1* 68.4*  --  70.1*  --   PLT 212 214  --  197  --     Basic Metabolic Panel:  Recent Labs Lab 03/17/15 1946 03/18/15 0532 03/19/15 0525  NA 139 141 139  K 4.4 4.1 4.0  CL 106 107 104  CO2 '22 25 26  '$ GLUCOSE 211* 149* 160*  BUN 26* 27* 19  CREATININE 0.97 0.92 0.75  CALCIUM 9.2 8.8* 8.8*   Liver Function Tests:  Recent Labs Lab 03/17/15 1946  AST 22  ALT 19  ALKPHOS 55  BILITOT 0.4  PROT 6.7  ALBUMIN 4.0   No results for input(s): LIPASE, AMYLASE in the last 168 hours. No results for input(s): AMMONIA in the last 168 hours.  Cardiac Enzymes: No results for input(s): CKTOTAL, CKMB, CKMBINDEX, TROPONINI in the last 168 hours.  BNP (last 3 results) No results for input(s): BNP in the last 8760 hours.  CBG:  Recent Labs Lab 03/18/15 1706 03/18/15 2347 03/19/15 0741 03/19/15 1158 03/19/15 1747  GLUCAP 165* 181* 150* 177* 186*    Recent Results (from the past 240 hour(s))  Urine culture     Status: None   Collection Time: 03/17/15  8:29 PM  Result Value Ref Range Status   Specimen Description URINE, CLEAN CATCH  Final   Special Requests NONE  Final   Culture   Final    MULTIPLE SPECIES PRESENT, SUGGEST RECOLLECTION Performed at [facility]  Report Status 03/19/2015 FINAL  Final     Studies: No results found.   Scheduled Meds: . sodium chloride   Intravenous Once  . atorvastatin  40 mg Oral q morning - 10a  . cholecalciferol  1,000 Units Oral Daily  . fluticasone  1 spray Each Nare Daily  . HYDROcodone-acetaminophen  2 tablet Oral Once  . insulin aspart  0-15 Units Subcutaneous TID WC  . insulin aspart  0-5 Units Subcutaneous QHS  . loratadine  10 mg Oral Daily  . Omega 3  1 capsule Oral Daily  . pantoprazole  40 mg Oral Daily  . phenazopyridine  100 mg Oral TID WC  . sodium chloride flush  3 mL Intravenous Q12H  . sodium chloride flush  3 mL Intravenous Q12H  . tamsulosin  0.4 mg Oral QPC supper   Continuous Infusions:  PRN Meds: sodium chloride, acetaminophen **OR** acetaminophen,  bisacodyl, ondansetron **OR** ondansetron (ZOFRAN) IV, oxyCODONE, senna-docusate, sodium chloride flush  Time spent: 30 minutes  Author: Berle Mull, MD [facility] Pager: [phone] 03/19/2015 7:03 PM  If 7PM-7AM, please contact night-coverage at [email or web address], password [facility]

## 2015-03-19 NOTE — Progress Notes (Signed)
Difficultly voiding at times.  Hematuria persists with blood clots.  Post void residual 0.  Hgb 6.9 (On call notified)

## 2015-03-19 NOTE — Progress Notes (Signed)
VASCULAR LAB PRELIMINARY  PRELIMINARY  PRELIMINARY  PRELIMINARY  Bilateral lower extremity venous duplex completed.    Preliminary report:  Bilateral:  No evidence of DVT, superficial thrombosis, or Baker's Cyst.   Donielle Kaigler, RVS 03/19/2015, 3:56 PM

## 2015-03-19 NOTE — Progress Notes (Signed)
PRIEST, LOCKRIDGE ([id number]) Visit Report for 03/17/2015 HBO Details Marvel Plan Date of Service: 03/17/2015 8:00 AM Patient Name: H. Patient Account Number: [id number] Medical Record Treating RN: [id number] Number: Other Clinician: Jacqulyn Bath Date of Birth/Sex: Jun 22, 1940 (75 y.o. 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 HBO Treatment Details Treatment Number: 30 Patient Type: Outpatient Chamber Type: Monoplace Chamber Serial #: [id number] Treatment Protocol: 2.0 ATA with 90 minutes oxygen, and no air breaks Treatment Details Compression Rate Down: 1.5 psi / minute De-Compression Rate Up: 1.5 psi / minute Air breaks and breathing Compress Tx Pressure periods Decompress Decompress Begins Reached (leave unused spaces Begins Ends blank) Chamber Pressure (ATA) 1 2 - - - - - - 2 1 Clock Time (24 hr) 08:27 08:39 - - - - - - 10:10 10:20 Treatment Length: 113 (minutes) Treatment Segments: 4 Capillary Blood Glucose Pre Capillary Blood Glucose (mg/dl): Post Capillary Blood Glucose (mg/dl): Vital Signs Capillary Blood Glucose Reference Range: 80 - 120 mg / dl HBO Diabetic Blood Glucose Intervention Range: <131 mg/dl or >249 mg/dl Time Vitals Blood Respiratory Capillary Blood Glucose Pulse Action Type: Pulse: Temperature: Taken: Pressure: Rate: Glucose (mg/dl): Meter #: Oximetry (%) Taken: Pre 07:50 164/68 84 18 97.7 129 1 Ensure given per protocol Pre 08:24 179 1 proceed with treatment per protocol Post 10:29 124/80 84 18 98.4 97 1 Savastano, Constant H. ([id number]) Treatment Response Treatment Completion Status: Treatment Completed without Adverse  Event Physician Notes No concerns with treatment given HBO Attestation I certify that I supervised this HBO treatment in accordance with Medicare guidelines. A trained Yes emergency response team is readily available per hospital policies and procedures. Continue HBOT as ordered. Yes Electronic Signature(s) Signed: 03/18/2015 5:04:49 PM By: Linton Ham MD Previous Signature: 03/17/2015 3:42:54 PM Version By: Becky Sax, Sallie RCP, RRT, CHT Entered By: Linton Ham on 03/17/2015 15:42:48 [name] ([id number]) -------------------------------------------------------------------------------- HBO Safety Checklist Details Marvel Plan Date of Service: 03/17/2015 8:00 AM Patient Name: H. Patient Account Number: [id number] Medical Record Treating RN: [id number] Number: Other Clinician: Jacqulyn Bath Date of Birth/Sex: [birth year] (75 y.o. Male) Treating ROBSON, MICHAEL Primary Care Physician/Extender: G POLITE, RONALD Physician: Referring Physician: POLITE, RONALD Weeks in Treatment: 7 HBO Safety Checklist Items Safety Checklist Consent Form Signed Patient voided / foley secured and emptied When did you last eato 06:30 am Last dose of injectable or oral agent 06:30 am NA Ostomy pouch emptied and vented if applicable NA All implantable devices assessed, documented and approved NA Intravenous access site secured and place Valuables secured Linens and cotton and cotton/polyester blend (less than 51% polyester) Personal oil-based products / skin lotions / body lotions removed Wigs or hairpieces removed Smoking or tobacco materials removed Books / newspapers / magazines / loose paper removed Cologne, aftershave, perfume and deodorant removed Jewelry removed (may wrap wedding band) Make-up removed Hair care products removed Battery operated devices (external) removed Heating patches and chemical warmers removed NA Titanium eyewear removed NA Nail  polish cured greater than 10 hours NA Casting material cured greater than 10 hours NA Hearing aids removed NA Loose dentures or partials removed NA Prosthetics have been removed Patient demonstrates correct use of air break device (if applicable) Patient concerns have been addressed Patient grounding bracelet on and cord attached to  chamber Specifics for Inpatients (complete in addition to above) Medication sheet sent with patient [name], Jeremiah ([id number]) Intravenous medications needed or due during therapy sent with patient Drainage tubes (e.g. nasogastric tube or chest tube secured and vented) Endotracheal or Tracheotomy tube secured Cuff deflated of air and inflated with saline Airway suctioned Electronic Signature(s) Signed: 03/17/2015 3:42:54 PM By: Lorine Bears RCP, RRT, CHT Entered By: Becky Sax, Amado Nash on 03/17/2015 08:22:26

## 2015-03-20 ENCOUNTER — Encounter: Payer: BLUE CROSS/BLUE SHIELD | Admitting: General Surgery

## 2015-03-20 LAB — COMPREHENSIVE METABOLIC PANEL
ALK PHOS: 59 U/L (ref 38–126)
ALT: 19 U/L (ref 17–63)
ANION GAP: 9 (ref 5–15)
AST: 23 U/L (ref 15–41)
Albumin: 4.1 g/dL (ref 3.5–5.0)
BILIRUBIN TOTAL: 0.9 mg/dL (ref 0.3–1.2)
BUN: 18 mg/dL (ref 6–20)
CALCIUM: 9.1 mg/dL (ref 8.9–10.3)
CO2: 26 mmol/L (ref 22–32)
Chloride: 104 mmol/L (ref 101–111)
Creatinine, Ser: 0.84 mg/dL (ref 0.61–1.24)
GLUCOSE: 162 mg/dL — AB (ref 65–99)
Potassium: 4.1 mmol/L (ref 3.5–5.1)
Sodium: 139 mmol/L (ref 135–145)
TOTAL PROTEIN: 6.7 g/dL (ref 6.5–8.1)

## 2015-03-20 LAB — TYPE AND SCREEN
ABO/RH(D): B POS
Antibody Screen: NEGATIVE
UNIT DIVISION: 0
UNIT DIVISION: 0
UNIT DIVISION: 0
UNIT DIVISION: 0
Unit division: 0

## 2015-03-20 LAB — CBC WITH DIFFERENTIAL/PLATELET
BASOS ABS: 0.1 10*3/uL (ref 0.0–0.1)
Basophils Relative: 1 %
EOS ABS: 0.1 10*3/uL (ref 0.0–0.7)
Eosinophils Relative: 2 %
HCT: 27.2 % — ABNORMAL LOW (ref 39.0–52.0)
HEMOGLOBIN: 8.5 g/dL — AB (ref 13.0–17.0)
LYMPHS PCT: 31 %
Lymphs Abs: 1.8 10*3/uL (ref 0.7–4.0)
MCH: 22.7 pg — ABNORMAL LOW (ref 26.0–34.0)
MCHC: 31.3 g/dL (ref 30.0–36.0)
MCV: 72.5 fL — ABNORMAL LOW (ref 78.0–100.0)
Monocytes Absolute: 0.7 10*3/uL (ref 0.1–1.0)
Monocytes Relative: 12 %
NEUTROS ABS: 3.2 10*3/uL (ref 1.7–7.7)
NEUTROS PCT: 54 %
Platelets: 196 10*3/uL (ref 150–400)
RBC: 3.75 MIL/uL — ABNORMAL LOW (ref 4.22–5.81)
RDW: 22.2 % — AB (ref 11.5–15.5)
WBC: 5.9 10*3/uL (ref 4.0–10.5)

## 2015-03-20 LAB — GLUCOSE, CAPILLARY
GLUCOSE-CAPILLARY: 161 mg/dL — AB (ref 65–99)
GLUCOSE-CAPILLARY: 160 mg/dL — AB (ref 65–99)

## 2015-03-20 MED ORDER — TAMSULOSIN HCL 0.4 MG PO CAPS: 0.4000 mg | ORAL_CAPSULE | Freq: Every day | ORAL | Status: DC

## 2015-03-20 MED ORDER — POLYETHYLENE GLYCOL 3350 17 G PO PACK: 17.0000 g | PACK | Freq: Every day | ORAL | Status: DC | PRN

## 2015-03-20 MED ORDER — PHENAZOPYRIDINE HCL 100 MG PO TABS: 100.0000 mg | ORAL_TABLET | Freq: Three times a day (TID) | ORAL | Status: DC

## 2015-03-20 MED ORDER — POLYETHYLENE GLYCOL 3350 17 G PO PACK
17.0000 g | PACK | Freq: Every day | ORAL | Status: DC
  Administered 2015-03-20: 17 g via ORAL
  Filled 2015-03-20 (×2): qty 1

## 2015-03-20 MED ORDER — FERROUS SULFATE 325 (65 FE) MG PO TABS: 325.0000 mg | ORAL_TABLET | Freq: Two times a day (BID) | ORAL | Status: DC

## 2015-03-20 MED ORDER — FERROUS SULFATE 325 (65 FE) MG PO TABS
325.0000 mg | ORAL_TABLET | Freq: Two times a day (BID) | ORAL | Status: DC
  Administered 2015-03-20: 325 mg via ORAL
  Filled 2015-03-20 (×2): qty 1

## 2015-03-20 NOTE — Care Management Important Message (Signed)
Important Message  Patient Details  Name: [name] MRN: [id number] Date of Birth: [birth year]   Medicare Important Message Given:  Yes    Camillo Flaming 03/20/2015, 1:11 [place] Message  Patient Details  Name: [name] MRN: [id number] Date of Birth: 1940/09/30   Medicare Important Message Given:  Yes    Camillo Flaming 03/20/2015, 1:09 PM

## 2015-03-20 NOTE — Progress Notes (Signed)
Discharge instructions given to pt, verbalized understanding. Left the unit in stable condition. 

## 2015-03-20 NOTE — Progress Notes (Signed)
Patient ID: [name], male   DOB: February 22, 1940, 75 y.o.   MRN: [id number]  Subjective: [name] reports that he is voiding without difficulty and has no suprapubic discomfort or flank pain.  He is still passing some clots.  They seemed to be old clots based on his description and I suspect he may have quite a bit of clotted blood within his bladder which will undoubtedly result in continued passage of fragments of the clot for some time.  He has 10 more hyperbaric oxygen treatments left and reported to me that he was seeing improvement with these treatments.  Objective: Vital signs in last 24 hours: Temp:  [97.7 F (36.5 C)-98.7 F (37.1 C)] 98.2 F (36.8 C) (02/17 0500) Pulse Rate:  [77-82] 82 (02/17 0500) Resp:  [16-18] 18 (02/17 0500) BP: (135-168)/(51-68) 150/63 mmHg (02/17 0700) SpO2:  [96 %-99 %] 96 % (02/17 0500)A  Intake/Output from previous day: 02/16 0701 - 02/17 0700 In: 307.5 [Blood:307.5] Out: -  Intake/Output this shift: Total I/O In: 240 [P.O.:240] Out: -   Past Medical History  Diagnosis Date  . Prostate cancer ([place]) 03/2013    had seed implant and radiation for 5 weeks  . Hyperlipidemia   . Hypertension   . Type 2 diabetes mellitus ([place])   . GERD (gastroesophageal reflux disease)   . Wears glasses   . Arthritis     Physical Exam:  Lungs - Normal respiratory effort, chest expands symmetrically.  Abdomen - Soft, non-tender & non-distended.  Lab Results:  Recent Labs  03/18/15 0826  03/19/15 0525 03/19/15 1319 03/20/15 0551  WBC 4.1  --  5.6  --  5.9  HGB 6.4*  < > 6.9* 8.2* 8.5*  HCT 21.6*  < > 23.0* 26.8* 27.2*  < > = values in this interval not displayed. BMET  Recent Labs  03/19/15 0525 03/20/15 0551  NA 139 139  K 4.0 4.1  CL 104 104  CO2 26 26  GLUCOSE 160* 162*  BUN 19 18  CREATININE 0.75 0.84  CALCIUM 8.8* 9.1   No results for input(s): LABURIN in the last 72 hours. Results for orders placed or performed during the  hospital encounter of 03/17/15  Urine culture     Status: None   Collection Time: 03/17/15  8:29 PM  Result Value Ref Range Status   Specimen Description URINE, CLEAN CATCH  Final   Special Requests NONE  Final   Culture   Final    MULTIPLE SPECIES PRESENT, SUGGEST RECOLLECTION Performed at [facility]    Report Status 03/19/2015 FINAL  Final    Studies/Results: No results found.  Assessment: His hemoglobin appears to be remaining stable now.  He is undergoing a course of hyperbaric oxygen treatments for his radiation induced prostatic bleeding which appears to have ceased.  His hemoglobin remained stable but he is still passing clots which I think are old clots and I suspect he has quite a bit of clotted blood in the bladder that will result in further passage of clots as this begins to dissolve and break up.  He does not appear to be actively bleeding at this time and could potentially be discharged.  Plan: 1.  Continue hyperbaric oxygen treatments as an outpatient 2.  He will contact me if he has further active bleeding  Clotile Whittington C 03/20/2015, 9:33 AM

## 2015-03-20 NOTE — Care Management Note (Signed)
Case Management Note  Patient Details  Name: IBRAHIMA HOLBERG MRN: [id number] Date of Birth: 08/30/1940  Subjective/Objective:    Admitted with hematuria r/t radiation cystitis                Action/Plan: Discharge planning, no HH needs identified  Expected Discharge Date:                  Expected Discharge Plan:  Home/Self Care  In-House Referral:  NA  Discharge planning Services  CM Consult  Post Acute Care Choice:  NA Choice offered to:  NA  DME Arranged:  N/A DME Agency:  NA  HH Arranged:  NA HH Agency:  NA  Status of Service:  Completed, signed off  Medicare Important Message Given:    Date Medicare IM Given:    Medicare IM give by:    Date Additional Medicare IM Given:    Additional Medicare Important Message give by:     If discussed at [place] of Stay Meetings, dates discussed:    Additional Comments:  Guadalupe Maple, RN 03/20/2015, 12:48 PM [phone]

## 2015-03-23 ENCOUNTER — Encounter: Payer: BLUE CROSS/BLUE SHIELD | Admitting: Surgery

## 2015-03-23 NOTE — Discharge Summary (Signed)
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 Follow-up with PCP in one week with CBC   Follow-up Information    Follow up with POLITE,RONALD D, MD. Schedule an appointment as soon as possible for a visit in 1 week.   Specialty:  Internal Medicine   Contact information:   [address] [place] [address] [phone]      Diet recommendation: regular diet  Activity: The patient is advised to gradually reintroduce usual activities.  Discharge Condition: good  History of present illness: As per the H and P dictated on admission, "[name] is a 75 y.o. 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 He was walking to the mailbox and taking out the garbage, but has been essentially chair-bound recently due to fatigue. Patient was admitted in November 2016 under very similar circumstances and had cystoscopy with bladder neck fulguration performed by Dr. Karsten Ro. He required 8 units of pRBCs during that admission. His wife was concerned that his current illness is secondary to recurrent anemia and asked the patient's endocrinologist to order a CBC. Shortly after the lab draw, patient was notified by the endocrinologist that hemoglobin was 4 and that the patient should proceed directly to an  emergency department. Patient notes that after the recent discharge on 01/04/2015, there was approximately 2 weeks without gross hematuria and only rare small clots. Since the new year however, patient reports persistence of gross hematuria, describing a light pink color to his urine and occasional passage of small clots. He denies any pain or difficulty voiding. He denies fever, chills, or dysuria. There is been no diarrhea, constipation, melena, or hematochezia. The patient is not anticoagulated but takes a baby aspirin daily. He has not followed up with his urologist since his discharge in December 2016.   In ED, patient was found to be afebrile, saturating well on room air, and with vital signs stable. CMP featured a serum glucose of 211 but otherwise unremarkable. CBC features hemoglobin of 4.1 with MCV of 66.1. DRE was performed and FOBT negative. 2 units of packed red blood cells were ordered for immediate transfusion from the emergency department, the patient remained hemodynamically stable, and will be admitted to telemetry for ongoing evaluation and management of blood loss anemia secondary to hematuria."  Hospital Course:  Summary of his active problems in the hospital is as following. 1. Acute blood loss anemia Prostate cancer with radiation cystitis. Gross hematuria.  Patient presents with complaints of generalized fatigue. Hemoglobin 4.1 on admission. After 2 units improved to 6.4. Currently 8.2. 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 Appreciate urology consult. Recommended to continue close monitoring. While urology has recommended to consider other sources of bleeding at present his hemoglobin remaining stable after blood transfusion and  patient denying any GI bleed or any abdominal pain or back pain tenderness and therefore no indication to obtain further workup regarding bleeding.  Since hemoglobin remained  stable after blood transfusion. Pyridium for symptomatically treatment. Also add Flomax.  2. Type 2 diabetes mellitus. continue home medication. Hold off on aspirin on discharge.  3. essential hypertension. Continuing Cozaar.  4. GERD. Continue PPI.  5. Pedal edema with calf tenderness.Doppler is negative for DVT.  All other chronic medical condition were stable during the hospitalization.  Patient was ambulatory without any assistance. On the day of the discharge the patient's H and H remained stable, and no other acute medical condition were reported by patient. the patient was felt safe to be discharge at home with family.  Procedures and Results:  none   Consultations:  urology  DISCHARGE MEDICATION: Discharge Medication List as of 03/20/2015  1:44 PM    START taking these medications   Details  ferrous sulfate 325 (65 FE) MG tablet Take 1 tablet (325 mg total) by mouth 2 (two) times daily with a meal., Starting 03/20/2015, Until Discontinued, Normal    phenazopyridine (PYRIDIUM) 100 MG tablet Take 1 tablet (100 mg total) by mouth 3 (three) times daily with meals., Starting 03/20/2015, Until Discontinued, Normal    polyethylene glycol (MIRALAX / GLYCOLAX) packet Take 17 g by mouth daily as needed for moderate constipation., Starting 03/20/2015, Until Discontinued, Normal    tamsulosin (FLOMAX) 0.4 MG CAPS capsule Take 1 capsule (0.4 mg total) by mouth daily after supper., Starting 03/20/2015, Until Discontinued, Normal      CONTINUE these medications which have NOT CHANGED   Details  atorvastatin (LIPITOR) 40 MG tablet Take 40 mg by mouth every morning. , Until Discontinued, Historical Med    cetirizine (ZYRTEC) 10 MG tablet Take 10 mg by mouth daily as needed for allergies., Until Discontinued, Historical Med    Cholecalciferol (VITAMIN D3) 1000 UNITS CAPS Take 1 capsule by mouth daily., Until Discontinued, Historical Med    cyanocobalamin 100 MCG tablet Take 100 mcg  by mouth daily., Until Discontinued, Historical Med    Fluticasone Propionate (FLONASE NA) Place 1 spray into the nose daily. , Until Discontinued, Historical Med    furosemide (LASIX) 20 MG tablet Take 20 mg by mouth daily., Until Discontinued, Historical Med    insulin regular (NOVOLIN R,HUMULIN R) 100 units/mL injection Inject 0.2 mLs (20 Units total) into the skin 2 (two) times daily before a meal., Starting 01/04/2015, Until Discontinued, Normal    metFORMIN (GLUCOPHAGE) 1000 MG tablet Take 1,000 mg by mouth 2 (two) times daily with a meal. , Starting 02/28/2013, Until Discontinued, Historical Med    Omega 3 1000 MG CAPS Take 1 capsule by mouth daily., Until Discontinued, Historical Med    guaiFENesin (MUCINEX) 600 MG 12 hr tablet Take 1,200 mg by mouth 2 (two) times daily as needed for cough or to loosen phlegm. , Until Discontinued, Historical Med    losartan (COZAAR) 25 MG tablet Take 25 mg by mouth daily. , Starting 03/17/2015, Until Discontinued, Historical Med    omeprazole (PRILOSEC) 20 MG capsule Take 20 mg by mouth daily as needed (heartburn). , Until Discontinued, Historical Med      STOP taking these medications     aspirin 81 MG tablet      potassium chloride SA (K-DUR,KLOR-CON) 20 MEQ tablet      ramipril (ALTACE) 10 MG capsule        No Known Allergies Discharge Instructions  Diet - low sodium heart healthy    Complete by:  As directed      Discharge instructions    Complete by:  As directed   It is important that you read following instructions as well as go over your medication list with RN to help you understand your care after this hospitalization.  Discharge Instructions: Please follow-up with PCP in one week  Please request your primary care physician to go over all Hospital Tests and Procedure/Radiological results at the follow up,  Please get all Hospital records sent to your PCP by signing hospital release before you go home.   You were cared for  by a hospitalist during your hospital stay. If you have any questions about your discharge medications or the care you received while you were in the hospital after you are discharged, you can call the unit and ask to speak with the hospitalist on call if the hospitalist that took care of you is not available.  Once you are discharged, your primary care physician will handle any further medical issues. Please note that NO REFILLS for any discharge medications will be authorized once you are discharged, as it is imperative that you return to your primary care physician (or establish a relationship with a primary care physician if you do not have one) for your aftercare needs so that they can reassess your need for medications and monitor your lab values. You Must read complete instructions/literature along with all the possible adverse reactions/side effects for all the Medicines you take and that have been prescribed to you. Take any new Medicines after you have completely understood and accept all the possible adverse reactions/side effects. Wear Seat belts while driving.     Increase activity slowly    Complete by:  As directed           Discharge Exam: There were no vitals filed for this visit. Filed Vitals:   03/20/15 0500 03/20/15 0700  BP: 168/68 150/63  Pulse: 82   Temp: 98.2 F (36.8 C)   Resp: 18    General: Appear in no distress, no Rash; Oral Mucosa moist. Cardiovascular: S1 and S2 Present, no Murmur, no JVD Respiratory: Bilateral Air entry present and Clear to Auscultation, no Crackles, no wheezes Abdomen: Bowel Sound present, Soft and no tenderness Extremities: no Pedal edema, no calf tenderness Neurology: Grossly no focal neuro deficit.  The results of significant diagnostics from this hospitalization (including imaging, microbiology, ancillary and laboratory) are listed below for reference.    Significant Diagnostic Studies: US Renal  03/18/2015  CLINICAL DATA:  Bladder  distention. EXAM: RENAL / URINARY TRACT ULTRASOUND COMPLETE COMPARISON:  None. FINDINGS: Right Kidney: Length: 11.4 cm. Echogenicity within normal limits. Suggestion of mild prominence of an upper pole calyx. Left Kidney: Length: 10.7 cm. Echogenicity within normal limits. Suggestion of mild prominence of the intrarenal collecting system. Bladder: Mild bladder distension. IMPRESSION: Suggestion of minimal prominence of the intrarenal collecting systems which may be secondary to mild bladder distention. Cannot exclude a degree of bladder outlet obstruction. Electronically Signed   By: Marin Olp M.D.   On: 03/18/2015 14:47    Microbiology: Recent Results (from the past 240 hour(s))  Urine culture     Status: None   Collection Time: 03/17/15  8:29 PM  Result Value Ref Range Status   Specimen Description URINE, CLEAN CATCH  Final   Special Requests NONE  Final   Culture   Final    MULTIPLE SPECIES PRESENT,  SUGGEST RECOLLECTION Performed at [facility]    Report Status 03/19/2015 FINAL  Final     Labs: CBC:  Recent Labs Lab 03/17/15 1946 03/18/15 6286 03/18/15 1508 03/19/15 0525 03/19/15 1319 03/20/15 0551  WBC 4.1 4.1  --  5.6  --  5.9  NEUTROABS  --  1.6*  --   --   --  3.2  HGB 4.1* 6.4* 7.5* 6.9* 8.2* 8.5*  HCT 14.4* 21.6* 24.7* 23.0* 26.8* 27.2*  MCV 66.1* 68.4*  --  70.1*  --  72.5*  PLT 212 214  --  197  --  381   Basic Metabolic Panel:  Recent Labs Lab 03/17/15 1946 03/18/15 0532 03/19/15 0525 03/20/15 0551  NA 139 141 139 139  K 4.4 4.1 4.0 4.1  CL 106 107 104 104  CO2 '22 25 26 26  '$ GLUCOSE 211* 149* 160* 162*  BUN 26* 27* 19 18  CREATININE 0.97 0.92 0.75 0.84  CALCIUM 9.2 8.8* 8.8* 9.1   Liver Function Tests:  Recent Labs Lab 03/17/15 1946 03/20/15 0551  AST 22 23  ALT 19 19  ALKPHOS 55 59  BILITOT 0.4 0.9  PROT 6.7 6.7  ALBUMIN 4.0 4.1   No results for input(s): LIPASE, AMYLASE in the last 168 hours. No results for input(s): AMMONIA  in the last 168 hours. Cardiac Enzymes: No results for input(s): CKTOTAL, CKMB, CKMBINDEX, TROPONINI in the last 168 hours. BNP (last 3 results) No results for input(s): BNP in the last 8760 hours. CBG:  Recent Labs Lab 03/19/15 1158 03/19/15 1747 03/19/15 2117 03/20/15 0749 03/20/15 1132  GLUCAP 177* 186* 173* 161* 160*   Time spent: 30 minutes  Signed:  Deitrich Steve  [facility] 03/20/2015, 7:56 AM

## 2015-03-24 ENCOUNTER — Encounter: Payer: BLUE CROSS/BLUE SHIELD | Admitting: Internal Medicine

## 2015-03-25 ENCOUNTER — Encounter: Payer: BLUE CROSS/BLUE SHIELD | Admitting: Internal Medicine

## 2015-03-25 MED FILL — Medication: Qty: 1 | Status: AC

## 2015-03-26 ENCOUNTER — Encounter: Payer: BLUE CROSS/BLUE SHIELD | Admitting: Surgery

## 2015-03-27 ENCOUNTER — Encounter: Payer: BLUE CROSS/BLUE SHIELD | Admitting: Surgery

## 2015-03-30 ENCOUNTER — Encounter: Payer: Medicare Other | Admitting: Surgery

## 2015-03-30 DIAGNOSIS — E119 Type 2 diabetes mellitus without complications: Secondary | ICD-10-CM | POA: Diagnosis not present

## 2015-03-30 DIAGNOSIS — I10 Essential (primary) hypertension: Secondary | ICD-10-CM | POA: Diagnosis not present

## 2015-03-30 DIAGNOSIS — Z8546 Personal history of malignant neoplasm of prostate: Secondary | ICD-10-CM | POA: Diagnosis not present

## 2015-03-30 DIAGNOSIS — N3041 Irradiation cystitis with hematuria: Secondary | ICD-10-CM | POA: Diagnosis present

## 2015-03-30 DIAGNOSIS — M109 Gout, unspecified: Secondary | ICD-10-CM | POA: Diagnosis not present

## 2015-03-30 DIAGNOSIS — Z794 Long term (current) use of insulin: Secondary | ICD-10-CM | POA: Diagnosis not present

## 2015-03-30 LAB — GLUCOSE, CAPILLARY: Glucose-Capillary: 102 mg/dL — ABNORMAL HIGH (ref 65–99)

## 2015-03-31 ENCOUNTER — Encounter: Payer: Medicare Other | Admitting: Internal Medicine

## 2015-03-31 DIAGNOSIS — N3041 Irradiation cystitis with hematuria: Secondary | ICD-10-CM | POA: Diagnosis not present

## 2015-03-31 LAB — GLUCOSE, CAPILLARY
GLUCOSE-CAPILLARY: 133 mg/dL — AB (ref 65–99)
GLUCOSE-CAPILLARY: 135 mg/dL — AB (ref 65–99)
Glucose-Capillary: 136 mg/dL — ABNORMAL HIGH (ref 65–99)

## 2015-03-31 NOTE — Progress Notes (Signed)
DANYL, DEEMS ([id number]) Visit Report for 03/30/2015 HBO Details Patient Name: [name], [name]. Date of Service: 03/30/2015 8:00 AM Medical Record Number: [id number] Patient Account Number: [id number] Date of Birth/Sex: Feb 20, 1940 (75 y.o. 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 HBO Treatment Details Treatment Number: 31 Patient Type: Outpatient Chamber Type: Monoplace Chamber Serial #: [id number] Treatment Protocol: 2.0 ATA with 90 minutes oxygen, and no air breaks Treatment Details Compression Rate Down: 1.5 psi / minute De-Compression Rate Up: 1.5 psi / minute Air breaks and breathing Compress Tx Pressure periods Decompress Decompress Begins Reached (leave unused spaces Begins Ends blank) Chamber Pressure (ATA) 1 2 - - - - - - 2 1 Clock Time (24 hr) 08:11 08:23 - - - - - - 09:53 10:04 Treatment Length: 113 (minutes) Treatment Segments: 4 Capillary Blood Glucose Pre Capillary Blood Glucose (mg/dl): Post Capillary Blood Glucose (mg/dl): Vital Signs Capillary Blood Glucose Reference Range: 80 - 120 mg / dl HBO Diabetic Blood Glucose Intervention Range: <131 mg/dl or >249 mg/dl Time Vitals Blood Respiratory Capillary Blood Glucose Pulse Action Type: Pulse: Temperature: Taken: Pressure: Rate: Glucose (mg/dl): Meter #: Oximetry (%) Taken: Pre 07:55 146/66 90 18 98.6 133 1 none Post 10:22 132/76 90 18 97.8 102 1 none Treatment Response Treatment Completion Status: Treatment Completed without Adverse Event [name], [name] ([id number]) HBO Attestation I certify that I supervised this HBO treatment  in accordance with Medicare guidelines. A trained Yes emergency response team is readily available per hospital policies and procedures. Continue HBOT as ordered. Yes Electronic Signature(s) Signed: 03/30/2015 10:53:17 AM By: Christin Fudge MD, FACS Entered By: Christin Fudge on 03/30/2015 10:53:16 [name], [name] ([id number]) -------------------------------------------------------------------------------- HBO Safety Checklist Details Patient Name: [name] Date of Service: 03/30/2015 8:00 AM Medical Record Number: [id number] Patient Account Number: [id number] Date of Birth/Sex: April 22, 1940 (75 y.o. Male) Treating RN: Primary Care Physician: POLITE, RONALD Other Clinician: Jacqulyn Bath Referring Physician: POLITE, RONALD Treating Physician/Extender: Frann Rider in Treatment: 8 HBO Safety Checklist Items Safety Checklist Consent Form Signed Patient voided / foley secured and emptied When did you last eato 06:15 am Last dose of injectable or oral agent 06:15 am NA Ostomy pouch emptied and vented if applicable NA All implantable devices assessed, documented and approved NA Intravenous access site secured and place Valuables secured Linens and cotton and cotton/polyester blend (less than 51% polyester) Personal oil-based products / skin lotions / body lotions removed Wigs or hairpieces removed Smoking or tobacco materials removed Books / newspapers / magazines / loose paper removed Cologne, aftershave, perfume and deodorant removed Jewelry removed (may wrap wedding band) Make-up removed Hair care products removed Battery operated devices (external) removed Heating patches and chemical warmers removed NA Titanium eyewear removed NA Nail polish cured greater than 10 hours NA Casting material cured greater than 10 hours NA Hearing aids removed NA Loose dentures or partials removed NA Prosthetics have been removed Patient demonstrates correct use of air  break device (if applicable) Patient concerns have been addressed Patient grounding bracelet on and cord attached to chamber Specifics for Inpatients (complete in addition to above) Medication sheet sent with patient Intravenous medications needed or due during therapy sent with patient [name], [name] ([id number]) Drainage tubes (e.g. nasogastric  tube or chest tube secured and vented) Endotracheal or Tracheotomy tube secured Cuff deflated of air and inflated with saline Airway suctioned Electronic Signature(s) Signed: 03/30/2015 4:45:32 PM By: Lorine Bears RCP, RRT, CHT Entered By: Lorine Bears on 03/30/2015 08:13:19

## 2015-03-31 NOTE — Progress Notes (Signed)
HRISTOPHER, MISSILDINE ([id number]) Visit Report for 03/30/2015 Arrival Information Details Patient Name: [name], [name]. Date of Service: 03/30/2015 8:00 AM Medical Record Number: [id number] Patient Account Number: [id number] Date of Birth/Sex: 24-Jul-1940 (75 y.o. Male) Treating RN: Primary Care Physician: POLITE, RONALD Other Clinician: Jacqulyn Bath Referring Physician: POLITE, RONALD Treating Physician/Extender: Frann Rider in Treatment: 8 Visit Information History Since Last Visit Added or deleted any medications: No Patient Arrived: Ambulatory Any new allergies or adverse reactions: No Arrival Time: 08:53 Had a fall or experienced change in No Accompanied By: family activities of daily living that may affect member risk of falls: Transfer Assistance: None Signs or symptoms of abuse/neglect since last No Patient Identification Verified: Yes visito Secondary Verification Process Yes Hospitalized since last visit: No Completed: Pain Present Now: No Patient Requires Transmission- No Based Precautions: Patient Has Alerts: No Electronic Signature(s) Signed: 03/30/2015 4:45:32 PM By: Lorine Bears RCP, RRT, CHT Entered By: Lorine Bears on 03/30/2015 08:07:58 [name] ([id number]) -------------------------------------------------------------------------------- Encounter Discharge Information Details Patient Name: [name] Date of Service: 03/30/2015 8:00 AM Medical Record Number: [id number] Patient Account Number: [id number] Date of Birth/Sex: [birth year] (75 y.o. Male) Treating RN: Primary Care Physician: POLITE, RONALD Other Clinician: Jacqulyn Bath Referring Physician: POLITE, RONALD Treating Physician/Extender: Frann Rider in Treatment: 8 Encounter Discharge Information Items Discharge Pain Level: 0 Discharge Condition: Stable Ambulatory Status: Ambulatory Discharge Destination:  Home Private Transportation: Auto Accompanied By: self Schedule Follow-up Appointment: No Medication Reconciliation completed and No provided to Patient/Care Regine Christian: Clinical Summary of Care: Notes Patient has an HBO treatment scheduled on 03/31/15 at 08:00 am. Electronic Signature(s) Signed: 03/30/2015 4:45:32 PM By: Lorine Bears RCP, RRT, CHT Entered By: Lorine Bears on 03/30/2015 10:30:45 [name] ([id number]) -------------------------------------------------------------------------------- Vitals Details Patient Name: [name] Date of Service: 03/30/2015 8:00 AM Medical Record Number: [id number] Patient Account Number: [id number] Date of Birth/Sex: 07-22-1940 (75 y.o. Male) Treating RN: Primary Care Physician: POLITE, RONALD Other Clinician: Jacqulyn Bath Referring Physician: POLITE, RONALD Treating Physician/Extender: Frann Rider in Treatment: 8 Vital Signs Time Taken: 07:55 Temperature (F): 98.6 Height (in): 70 Pulse (bpm): 90 Weight (lbs): 243 Respiratory Rate (breaths/min): 18 Body Mass Index (BMI): 34.9 Blood Pressure (mmHg): 146/66 Capillary Blood Glucose (mg/dl): 133 Reference Range: 80 - 120 mg / dl Electronic Signature(s) Signed: 03/30/2015 4:45:32 PM By: Lorine Bears RCP, RRT, CHT Entered By: Lorine Bears on 03/30/2015 16:60:63

## 2015-04-01 ENCOUNTER — Encounter: Payer: BLUE CROSS/BLUE SHIELD | Admitting: Internal Medicine

## 2015-04-01 NOTE — Progress Notes (Signed)
BURWELL, BETHEL ([id number]) Visit Report for 03/31/2015 HBO Details Marvel Plan Date of Service: 03/31/2015 8:00 AM Patient Name: H. Patient Account Number: [id number] Medical Record Treating RN: [id number] Number: Other Clinician: Jacqulyn Bath Date of Birth/Sex: March 24, 1940 (74 y.o. 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 HBO Treatment Details Treatment Number: 32 Patient Type: Outpatient Chamber Type: Monoplace Chamber Serial #: [id number] Treatment Protocol: 2.0 ATA with 90 minutes oxygen, and no air breaks Treatment Details Compression Rate Down: 1.5 psi / minute De-Compression Rate Up: 1.5 psi / minute Air breaks and breathing Compress Tx Pressure periods Decompress Decompress Begins Reached (leave unused spaces Begins Ends blank) Chamber Pressure (ATA) 1 2 - - - - - - 2 1 Clock Time (24 hr) 08:09 08:21 - - - - - - 09:51 10:01 Treatment Length: 112 (minutes) Treatment Segments: 4 Capillary Blood Glucose Pre Capillary Blood Glucose (mg/dl): Post Capillary Blood Glucose (mg/dl): Vital Signs Capillary Blood Glucose Reference Range: 80 - 120 mg / dl HBO Diabetic Blood Glucose Intervention Range: <131 mg/dl or >249 mg/dl Time Vitals Blood Respiratory Capillary Blood Glucose Pulse Action Type: Pulse: Temperature: Taken: Pressure: Rate: Glucose (mg/dl): Meter #: Oximetry (%) Taken: Pre 07:53 146/70 96 18 98.4 136 1 none Post 10:24 134/64 84 18 98.7 135 1 none [name], [name] ([id number]) Treatment Response Treatment Completion Status: Treatment Completed without Adverse Event Physician Notes No concerns expressed with Rx given HBO  Attestation I certify that I supervised this HBO treatment in accordance with Medicare guidelines. A trained Yes emergency response team is readily available per hospital policies and procedures. Continue HBOT as ordered. Yes Electronic Signature(s) Signed: 04/01/2015 8:09:37 AM By: Linton Ham MD Previous Signature: 03/31/2015 3:27:13 PM Version By: Becky Sax, Sallie RCP, RRT, CHT Entered By: Linton Ham on 04/01/2015 07:47:46 [name] ([id number]) -------------------------------------------------------------------------------- HBO Safety Checklist Details Marvel Plan Date of Service: 03/31/2015 8:00 AM Patient Name: H. Patient Account Number: [id number] Medical Record Treating RN: [id number] Number: Other Clinician: Jacqulyn Bath Date of Birth/Sex: 09-14-1940 (74 y.o. Male) Treating ROBSON, MICHAEL Primary Care Physician/Extender: G POLITE, RONALD Physician: Referring Physician: POLITE, RONALD Weeks in Treatment: 9 HBO Safety Checklist Items Safety Checklist Consent Form Signed Patient voided / foley secured and emptied When did you last eato 06:30 am Last dose of injectable or oral agent 06:30 am NA Ostomy pouch emptied and vented if applicable NA All implantable devices assessed, documented and approved NA Intravenous access site secured and place Valuables secured Linens and cotton and cotton/polyester blend (less than 51% polyester) Personal oil-based products / skin lotions / body lotions removed Wigs or hairpieces removed Smoking or tobacco materials removed Books / newspapers / magazines / loose paper removed Cologne, aftershave, perfume and deodorant removed Jewelry removed (may wrap wedding band) Make-up removed Hair care products removed Battery operated devices (external) removed Heating patches and chemical warmers removed NA Titanium eyewear removed NA Nail polish cured greater than 10 hours NA Casting material cured  greater than 10 hours NA Hearing aids removed NA Loose dentures or partials removed NA Prosthetics have been removed Patient demonstrates correct use of air break device (if applicable) Patient concerns have been addressed Patient grounding bracelet on and cord attached to chamber Specifics for Inpatients (complete in addition to above) Medication  sheet sent with patient [name], [name] ([id number]) Intravenous medications needed or due during therapy sent with patient Drainage tubes (e.g. nasogastric tube or chest tube secured and vented) Endotracheal or Tracheotomy tube secured Cuff deflated of air and inflated with saline Airway suctioned Electronic Signature(s) Signed: 03/31/2015 3:27:13 PM By: Lorine Bears RCP, RRT, CHT Entered By: Lorine Bears on 03/31/2015 08:24:46

## 2015-04-01 NOTE — Progress Notes (Signed)
NORMAND, DAMRON ([id number]) Visit Report for 03/31/2015 Arrival Information Details HANI, CAMPUSANO Date of Service: 03/31/2015 8:00 AM Patient Name: H. Patient Account Number: [id number] Medical Record Treating RN: [id number] Number: Other Clinician: Jacqulyn Bath Date of Birth/Sex: 09-08-1940 (75 y.o. Male) Treating ROBSON, [place] Primary Care Physician/Extender: G POLITE, RONALD Physician: Referring Physician: POLITE, RONALD Weeks in Treatment: 9 Visit Information History Since Last Visit Added or deleted any medications: No Patient Arrived: Ambulatory Any new allergies or adverse reactions: No Arrival Time: 07:50 Had a fall or experienced change in No Accompanied By: self activities of daily living that may affect Transfer Assistance: None risk of falls: Patient Identification Verified: Yes Signs or symptoms of abuse/neglect since last No Secondary Verification Process Yes visito Completed: Hospitalized since last visit: No Patient Requires Transmission-Based No Pain Present Now: No Precautions: Patient Has Alerts: No Electronic Signature(s) Signed: 03/31/2015 3:27:13 PM By: Lorine Bears RCP, RRT, CHT Entered By: Lorine Bears on 03/31/2015 08:23:02 RESHAUN, BRISENO ([id number]) -------------------------------------------------------------------------------- Encounter Discharge Information Details Marvel Plan Date of Service: 03/31/2015 8:00 AM Patient Name: H. Patient Account Number: [id number] Medical Record Treating RN: [id number] Number: Other Clinician: Jacqulyn Bath Date of Birth/Sex: 1940/05/05 (74 y.o. Male) Treating ROBSON, MICHAEL Primary Care Physician/Extender: Rea College, RONALD Physician: Referring Physician: POLITE, RONALD Weeks in Treatment: 9 Encounter Discharge Information Items Discharge Pain Level: 0 Discharge Condition: Stable Ambulatory Status: Ambulatory Discharge Destination:  Home Private Transportation: Auto Accompanied By: self Schedule Follow-up Appointment: No Medication Reconciliation completed and No provided to Patient/Care Eliezer Khawaja: Clinical Summary of Care: Notes Patient has an HBO treatment scheduled on 04/01/15 at 0800 am. Electronic Signature(s) Signed: 03/31/2015 3:27:13 PM By: Lorine Bears RCP, RRT, CHT Entered By: Becky Sax, Amado Nash on 03/31/2015 10:36:33 GODRIC, LAVELL ([id number]) -------------------------------------------------------------------------------- Vitals Details Marvel Plan Date of Service: 03/31/2015 8:00 AM Patient Name: H. Patient Account Number: [id number] Medical Record Treating RN: [id number] Number: Other Clinician: Jacqulyn Bath Date of Birth/Sex: 19-Jan-1941 (75 y.o. Male) Treating ROBSON, MICHAEL Primary Care Physician/Extender: G POLITE, RONALD Physician: Referring Physician: POLITE, RONALD Weeks in Treatment: 9 Vital Signs Time Taken: 07:53 Temperature (F): 98.4 Height (in): 70 Pulse (bpm): 96 Weight (lbs): 243 Respiratory Rate (breaths/min): 18 Body Mass Index (BMI): 34.9 Blood Pressure (mmHg): 146/70 Capillary Blood Glucose (mg/dl): 136 Reference Range: 80 - 120 mg / dl Electronic Signature(s) Signed: 03/31/2015 3:27:13 PM By: Lorine Bears RCP, RRT, CHT Entered By: Becky Sax, Amado Nash on 03/31/2015 08:23:35

## 2015-04-02 ENCOUNTER — Encounter: Payer: Medicare Other | Attending: Surgery | Admitting: Surgery

## 2015-04-02 DIAGNOSIS — Z8546 Personal history of malignant neoplasm of prostate: Secondary | ICD-10-CM | POA: Insufficient documentation

## 2015-04-02 DIAGNOSIS — E119 Type 2 diabetes mellitus without complications: Secondary | ICD-10-CM | POA: Insufficient documentation

## 2015-04-02 DIAGNOSIS — Z794 Long term (current) use of insulin: Secondary | ICD-10-CM | POA: Insufficient documentation

## 2015-04-02 DIAGNOSIS — N3041 Irradiation cystitis with hematuria: Secondary | ICD-10-CM | POA: Diagnosis not present

## 2015-04-02 DIAGNOSIS — M109 Gout, unspecified: Secondary | ICD-10-CM | POA: Diagnosis not present

## 2015-04-02 DIAGNOSIS — I10 Essential (primary) hypertension: Secondary | ICD-10-CM | POA: Insufficient documentation

## 2015-04-02 LAB — GLUCOSE, CAPILLARY
Glucose-Capillary: 222 mg/dL — ABNORMAL HIGH (ref 65–99)
Glucose-Capillary: 114 mg/dL — ABNORMAL HIGH (ref 65–99)

## 2015-04-03 ENCOUNTER — Encounter: Payer: Medicare Other | Admitting: Surgery

## 2015-04-03 DIAGNOSIS — N3041 Irradiation cystitis with hematuria: Secondary | ICD-10-CM | POA: Diagnosis not present

## 2015-04-03 LAB — GLUCOSE, CAPILLARY: GLUCOSE-CAPILLARY: 122 mg/dL — AB (ref 65–99)

## 2015-04-03 NOTE — Progress Notes (Signed)
KAYHAN, BOARDLEY ([id number]) Visit Report for 04/02/2015 Arrival Information Details Patient Name: [name], [name]. Date of Service: 04/02/2015 8:00 AM Medical Record Number: [id number] Patient Account Number: [id number] Date of Birth/Sex: 07-08-1940 (75 y.o. Male) Treating RN: Primary Care Physician: POLITE, RONALD Other Clinician: Jacqulyn Bath Referring Physician: POLITE, RONALD Treating Physician/Extender: Frann Rider in Treatment: 9 Visit Information History Since Last Visit Added or deleted any medications: No Patient Arrived: Ambulatory Any new allergies or adverse reactions: No Arrival Time: 07:52 Had a fall or experienced change in No Accompanied By: self activities of daily living that may affect Transfer Assistance: None risk of falls: Patient Identification Verified: Yes Signs or symptoms of abuse/neglect since last No Secondary Verification Process Yes visito Completed: Pain Present Now: No Patient Requires Transmission-Based No Precautions: Patient Has Alerts: No Electronic Signature(s) Signed: 04/02/2015 4:38:25 PM By: Lorine Bears RCP, RRT, CHT Entered By: Lorine Bears on 04/02/2015 08:25:42 [name] ([id number]) -------------------------------------------------------------------------------- Encounter Discharge Information Details Patient Name: [name] Date of Service: 04/02/2015 8:00 AM Medical Record Number: [id number] Patient Account Number: [id number] Date of Birth/Sex: 06/26/1940 (75 y.o. Male) Treating RN: Primary Care Physician: POLITE, RONALD Other Clinician: Jacqulyn Bath Referring Physician: POLITE, RONALD Treating Physician/Extender: Frann Rider in Treatment: 9 Encounter Discharge Information Items Discharge Pain Level: 0 Discharge Condition: Stable Ambulatory Status: Ambulatory Discharge Destination: Home Private Transportation: Auto Accompanied By: self Schedule  Follow-up Appointment: No Medication Reconciliation completed and No provided to Patient/Care Lebron Nauert: Clinical Summary of Care: Notes Patient has an HBO treatment scheduled on 04/03/15 at 08:00 am. Electronic Signature(s) Signed: 04/02/2015 4:38:25 PM By: Lorine Bears RCP, RRT, CHT Entered By: Lorine Bears on 04/02/2015 10:47:15 [name] ([id number]) -------------------------------------------------------------------------------- Vitals Details Patient Name: [name] Date of Service: 04/02/2015 8:00 AM Medical Record Number: [id number] Patient Account Number: [id number] Date of Birth/Sex: Jan 23, 1941 (74 y.o. Male) Treating RN: Primary Care Physician: POLITE, RONALD Other Clinician: Jacqulyn Bath Referring Physician: POLITE, RONALD Treating Physician/Extender: Frann Rider in Treatment: 9 Vital Signs Time Taken: 07:54 Temperature (F): 98.2 Height (in): 70 Pulse (bpm): 84 Weight (lbs): 243 Respiratory Rate (breaths/min): 18 Body Mass Index (BMI): 34.9 Blood Pressure (mmHg): 132/68 Capillary Blood Glucose (mg/dl): 222 Reference Range: 80 - 120 mg / dl Electronic Signature(s) Signed: 04/02/2015 4:38:25 PM By: Lorine Bears RCP, RRT, CHT Entered By: Becky Sax, Amado Nash on 04/02/2015 08:26:07

## 2015-04-03 NOTE — Progress Notes (Signed)
Jeremiah, [name] ([id number]) Visit Report for 04/02/2015 HBO Details Patient Name: [name], [name]. Date of Service: 04/02/2015 8:00 AM Medical Record Number: [id number] Patient Account Number: [id number] Date of Birth/Sex: [birth year] (75 y.o. 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 HBO Treatment Details Treatment Number: 33 Patient Type: Outpatient Chamber Type: Monoplace Chamber Serial #: [id number] Treatment Protocol: 2.0 ATA with 90 minutes oxygen, and no air breaks Treatment Details Compression Rate Down: 1.5 psi / minute De-Compression Rate Up: 1.5 psi / minute Air breaks and breathing Compress Tx Pressure periods Decompress Decompress Begins Reached (leave unused spaces Begins Ends blank) Chamber Pressure (ATA) 1 2 - - - - - - 2 1 Clock Time (24 hr) 08:10 08:22 - - - - - - 09:52 10:02 Treatment Length: 112 (minutes) Treatment Segments: 4 Capillary Blood Glucose Pre Capillary Blood Glucose (mg/dl): Post Capillary Blood Glucose (mg/dl): Vital Signs Capillary Blood Glucose Reference Range: 80 - 120 mg / dl HBO Diabetic Blood Glucose Intervention Range: <131 mg/dl or >249 mg/dl Time Vitals Blood Respiratory Capillary Blood Glucose Pulse Action Type: Pulse: Temperature: Taken: Pressure: Rate: Glucose (mg/dl): Meter #: Oximetry (%) Taken: Pre 07:54 132/68 84 18 98.2 222 1 none Post 10:22 18 98.5 114 1 none Treatment Response Treatment Completion Status: Treatment Completed without Adverse Event DAJOHN, ELLENDER ([id number]) HBO Attestation I certify that I supervised this HBO treatment in accordance  with Medicare guidelines. A trained Yes emergency response team is readily available per hospital policies and procedures. Continue HBOT as ordered. Yes Electronic Signature(s) Signed: 04/02/2015 11:12:26 AM By: Christin Fudge MD, FACS Entered By: Christin Fudge on 04/02/2015 11:12:25 ORA, MCNATT ([id number]) -------------------------------------------------------------------------------- HBO Safety Checklist Details Patient Name: [name] Date of Service: 04/02/2015 8:00 AM Medical Record Number: [id number] Patient Account Number: [id number] Date of Birth/Sex: 07-06-1940 (75 y.o. Male) Treating RN: Primary Care Physician: POLITE, RONALD Other Clinician: Jacqulyn Bath Referring Physician: POLITE, RONALD Treating Physician/Extender: Frann Rider in Treatment: 9 HBO Safety Checklist Items Safety Checklist Consent Form Signed Patient voided / foley secured and emptied When did you last eato 06:30 am Last dose of injectable or oral agent 06:30 am NA Ostomy pouch emptied and vented if applicable NA All implantable devices assessed, documented and approved NA Intravenous access site secured and place Valuables secured Linens and cotton and cotton/polyester blend (less than 51% polyester) Personal oil-based products / skin lotions / body lotions removed Wigs or hairpieces removed Smoking or tobacco materials removed Books / newspapers / magazines / loose paper removed Cologne, aftershave, perfume and deodorant removed Jewelry removed (may wrap wedding band) Make-up removed Hair care products removed Battery operated devices (external) removed Heating patches and chemical warmers removed NA Titanium eyewear removed NA Nail polish cured greater than 10 hours NA Casting material cured greater than 10 hours NA Hearing aids removed NA Loose dentures or partials removed NA Prosthetics have been removed Patient demonstrates correct use of air break device (if  applicable) Patient concerns have been addressed Patient grounding bracelet on and cord attached to chamber Specifics for Inpatients (complete in addition to above) Medication sheet sent with patient Intravenous medications needed or due during therapy sent with patient CAN, LUCCI ([id number]) Drainage tubes (e.g. nasogastric tube or  chest tube secured and vented) Endotracheal or Tracheotomy tube secured Cuff deflated of air and inflated with saline Airway suctioned Electronic Signature(s) Signed: 04/02/2015 11:12:17 AM By: Christin Fudge MD, FACS Entered By: Christin Fudge on 04/02/2015 11:12:17

## 2015-04-04 NOTE — Progress Notes (Signed)
TOWNES, FUHS ([id number]) Visit Report for 04/03/2015 HBO Details Patient Name: [name], [name]. Date of Service: 04/03/2015 8:00 AM Medical Record Number: [id number] Patient Account Number: [id number] Date of Birth/Sex: 09-06-1940 (75 y.o. 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 HBO Treatment Details Treatment Number: 34 Patient Type: Outpatient Chamber Type: Monoplace Chamber Serial #: [id number] Treatment Protocol: 2.0 ATA with 90 minutes oxygen, and no air breaks Treatment Details Compression Rate Down: 1.5 psi / minute De-Compression Rate Up: 1.5 psi / minute Air breaks and breathing Compress Tx Pressure periods Decompress Decompress Begins Reached (leave unused spaces Begins Ends blank) Chamber Pressure (ATA) 1 2 - - - - - - 2 1 Clock Time (24 hr) 08:02 08:14 - - - - - - 09:44 09:54 Treatment Length: 112 (minutes) Treatment Segments: 4 Capillary Blood Glucose Pre Capillary Blood Glucose (mg/dl): Post Capillary Blood Glucose (mg/dl): Vital Signs Capillary Blood Glucose Reference Range: 80 - 120 mg / dl HBO Diabetic Blood Glucose Intervention Range: <131 mg/dl or >249 mg/dl Time Vitals Blood Respiratory Capillary Blood Glucose Pulse Action Type: Pulse: Temperature: Taken: Pressure: Rate: Glucose (mg/dl): Meter #: Oximetry (%) Taken: Pre 07:47 144/76 84 18 98.2 154 1 none Post 10:13 148/78 78 18 98.4 122 1 none Treatment Response Treatment Completion Status: Treatment Completed without Adverse Event [name], [name] ([id number]) HBO Attestation I certify that I supervised this HBO treatment  in accordance with Medicare guidelines. A trained Yes emergency response team is readily available per hospital policies and procedures. Continue HBOT as ordered. Yes Electronic Signature(s) Signed: 04/03/2015 10:53:12 AM By: Christin Fudge MD, FACS Entered By: Christin Fudge on 04/03/2015 10:53:11 [name], [name] ([id number]) -------------------------------------------------------------------------------- HBO Safety Checklist Details Patient Name: [name] Date of Service: 04/03/2015 8:00 AM Medical Record Number: [id number] Patient Account Number: [id number] Date of Birth/Sex: 04/21/1940 (75 y.o. Male) Treating RN: Primary Care Physician: POLITE, RONALD Other Clinician: Jacqulyn Bath Referring Physician: POLITE, RONALD Treating Physician/Extender: Frann Rider in Treatment: 9 HBO Safety Checklist Items Safety Checklist Consent Form Signed Patient voided / foley secured and emptied When did you last eato 06:30 am Last dose of injectable or oral agent 06:30 am NA Ostomy pouch emptied and vented if applicable NA All implantable devices assessed, documented and approved NA Intravenous access site secured and place Valuables secured Linens and cotton and cotton/polyester blend (less than 51% polyester) Personal oil-based products / skin lotions / body lotions removed Wigs or hairpieces removed Smoking or tobacco materials removed Books / newspapers / magazines / loose paper removed Cologne, aftershave, perfume and deodorant removed Jewelry removed (may wrap wedding band) Make-up removed Hair care products removed Battery operated devices (external) removed Heating patches and chemical warmers removed NA Titanium eyewear removed NA Nail polish cured greater than 10 hours NA Casting material cured greater than 10 hours NA Hearing aids removed NA Loose dentures or partials removed NA Prosthetics have been removed Patient demonstrates correct use of air  break device (if applicable) Patient concerns have been addressed Patient grounding bracelet on and cord attached to chamber Specifics for Inpatients (complete in addition to above) Medication sheet sent with patient Intravenous medications needed or due during therapy sent with patient [name], [name] ([id number]) Drainage tubes (e.g. nasogastric  tube or chest tube secured and vented) Endotracheal or Tracheotomy tube secured Cuff deflated of air and inflated with saline Airway suctioned Electronic Signature(s) Signed: 04/03/2015 4:13:30 PM By: Lorine Bears RCP, RRT, CHT Entered By: Lorine Bears on 04/03/2015 07:56:18

## 2015-04-04 NOTE — Progress Notes (Signed)
KHOLTON, COATE ([id number]) Visit Report for 04/03/2015 Arrival Information Details Patient Name: [name], [name]. Date of Service: 04/03/2015 8:00 AM Medical Record Number: [id number] Patient Account Number: [id number] Date of Birth/Sex: [birth year] (75 y.o. Male) Treating RN: Primary Care Physician: POLITE, RONALD Other Clinician: Jacqulyn Bath Referring Physician: POLITE, RONALD Treating Physician/Extender: Frann Rider in Treatment: 9 Visit Information History Since Last Visit Added or deleted any medications: No Patient Arrived: Ambulatory Any new allergies or adverse reactions: No Arrival Time: 07:45 Had a fall or experienced change in No Accompanied By: self activities of daily living that may affect Transfer Assistance: None risk of falls: Patient Identification Verified: Yes Signs or symptoms of abuse/neglect since last No Secondary Verification Process Yes visito Completed: Hospitalized since last visit: No Patient Requires Transmission-Based No Pain Present Now: No Precautions: Patient Has Alerts: No Electronic Signature(s) Signed: 04/03/2015 4:13:30 PM By: Lorine Bears RCP, RRT, CHT Entered By: Lorine Bears on 04/03/2015 07:55:06 [name] ([id number]) -------------------------------------------------------------------------------- Encounter Discharge Information Details Patient Name: [name] Date of Service: 04/03/2015 8:00 AM Medical Record Number: [id number] Patient Account Number: [id number] Date of Birth/Sex: September 11, 1940 (75 y.o. Male) Treating RN: Primary Care Physician: POLITE, RONALD Other Clinician: Jacqulyn Bath Referring Physician: POLITE, RONALD Treating Physician/Extender: Frann Rider in Treatment: 9 Encounter Discharge Information Items Discharge Pain Level: 0 Discharge Condition: Stable Ambulatory Status: Ambulatory Discharge Destination:  Home Private Transportation: Auto Accompanied By: self Schedule Follow-up Appointment: No Medication Reconciliation completed and No provided to Patient/Care Kimie Pidcock: Clinical Summary of Care: Notes Patient has an HBO treatment scheduled on 04/06/15 at 08:00 am. Electronic Signature(s) Signed: 04/03/2015 4:13:30 PM By: Lorine Bears RCP, RRT, CHT Entered By: Lorine Bears on 04/03/2015 10:25:55 [name] ([id number]) -------------------------------------------------------------------------------- Vitals Details Patient Name: [name] Date of Service: 04/03/2015 8:00 AM Medical Record Number: [id number] Patient Account Number: [id number] Date of Birth/Sex: 01-09-1941 (75 y.o. Male) Treating RN: Primary Care Physician: POLITE, RONALD Other Clinician: Jacqulyn Bath Referring Physician: POLITE, RONALD Treating Physician/Extender: Frann Rider in Treatment: 9 Vital Signs Time Taken: 07:47 Temperature (F): 98.2 Height (in): 70 Pulse (bpm): 84 Weight (lbs): 243 Respiratory Rate (breaths/min): 18 Body Mass Index (BMI): 34.9 Blood Pressure (mmHg): 144/76 Capillary Blood Glucose (mg/dl): 154 Reference Range: 80 - 120 mg / dl Electronic Signature(s) Signed: 04/03/2015 4:13:30 PM By: Lorine Bears RCP, RRT, CHT Entered By: Lorine Bears on 04/03/2015 07:55:31

## 2015-04-06 ENCOUNTER — Encounter: Payer: Medicare Other | Admitting: Surgery

## 2015-04-06 DIAGNOSIS — N3041 Irradiation cystitis with hematuria: Secondary | ICD-10-CM | POA: Diagnosis not present

## 2015-04-06 LAB — GLUCOSE, CAPILLARY: GLUCOSE-CAPILLARY: 113 mg/dL — AB (ref 65–99)

## 2015-04-06 NOTE — Progress Notes (Signed)
KAELOB, PERSKY ([id number]) Visit Report for 04/06/2015 Arrival Information Details Patient Name: [name], [name]. Date of Service: 04/06/2015 8:00 AM Medical Record Number: [id number] Patient Account Number: [id number] Date of Birth/Sex: 1940/05/13 (75 y.o. Male) Treating RN: Primary Care Physician: POLITE, RONALD Other Clinician: Jacqulyn Bath Referring Physician: POLITE, RONALD Treating Physician/Extender: Frann Rider in Treatment: 9 Visit Information History Since Last Visit Added or deleted any medications: No Patient Arrived: Ambulatory Any new allergies or adverse reactions: No Arrival Time: 07:47 Had a fall or experienced change in No Accompanied By: self activities of daily living that may affect Transfer Assistance: None risk of falls: Patient Identification Verified: Yes Signs or symptoms of abuse/neglect since last No Secondary Verification Process Yes visito Completed: Hospitalized since last visit: No Patient Requires Transmission-Based No Pain Present Now: No Precautions: Patient Has Alerts: No Electronic Signature(s) Signed: 04/06/2015 4:24:01 PM By: Lorine Bears RCP, RRT, CHT Entered By: Lorine Bears on 04/06/2015 08:15:31 [name] ([id number]) -------------------------------------------------------------------------------- Encounter Discharge Information Details Patient Name: [name] Date of Service: 04/06/2015 8:00 AM Medical Record Number: [id number] Patient Account Number: [id number] Date of Birth/Sex: 30-Jun-1940 (75 y.o. Male) Treating RN: Primary Care Physician: POLITE, RONALD Other Clinician: Jacqulyn Bath Referring Physician: POLITE, RONALD Treating Physician/Extender: Frann Rider in Treatment: 9 Encounter Discharge Information Items Discharge Pain Level: 0 Discharge Condition: Stable Ambulatory Status: Ambulatory Discharge Destination:  Home Private Transportation: Auto Accompanied By: self Schedule Follow-up Appointment: No Medication Reconciliation completed and No provided to Patient/Care Eldred Lievanos: Clinical Summary of Care: Notes Patient has an HBO treatment scheduled on 04/07/15 at 08:00 am. Electronic Signature(s) Signed: 04/06/2015 4:24:01 PM By: Lorine Bears RCP, RRT, CHT Entered By: Lorine Bears on 04/06/2015 10:29:37 [name] ([id number]) -------------------------------------------------------------------------------- Vitals Details Patient Name: [name] Date of Service: 04/06/2015 8:00 AM Medical Record Number: [id number] Patient Account Number: [id number] Date of Birth/Sex: [birth year] (75 y.o. Male) Treating RN: Primary Care Physician: POLITE, RONALD Other Clinician: Jacqulyn Bath Referring Physician: POLITE, RONALD Treating Physician/Extender: Frann Rider in Treatment: 9 Vital Signs Time Taken: 07:54 Temperature (F): 98.5 Height (in): 70 Pulse (bpm): 84 Weight (lbs): 243 Respiratory Rate (breaths/min): 18 Body Mass Index (BMI): 34.9 Blood Pressure (mmHg): 146/78 Capillary Blood Glucose (mg/dl): 173 Reference Range: 80 - 120 mg / dl Electronic Signature(s) Signed: 04/06/2015 4:24:01 PM By: Lorine Bears RCP, RRT, CHT Entered By: Becky Sax, Amado Nash on 04/06/2015 08:16:07

## 2015-04-06 NOTE — Progress Notes (Signed)
WINFREY, CHILLEMI ([id number]) Visit Report for 04/06/2015 HBO Details Patient Name: [name], [name]. Date of Service: 04/06/2015 8:00 AM Medical Record Number: [id number] Patient Account Number: [id number] Date of Birth/Sex: 1940/12/28 (75 y.o. 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 HBO Treatment Details Treatment Number: 35 Patient Type: Outpatient Chamber Type: Monoplace Chamber Serial #: [id number] Treatment Protocol: 2.0 ATA with 90 minutes oxygen, and no air breaks Treatment Details Compression Rate Down: 1.5 psi / minute De-Compression Rate Up: 1.5 psi / minute Air breaks and breathing Compress Tx Pressure periods Decompress Decompress Begins Reached (leave unused spaces Begins Ends blank) Chamber Pressure (ATA) 1 2 - - - - - - 2 1 Clock Time (24 hr) 08:15 08:27 - - - - - - 09:57 10:07 Treatment Length: 112 (minutes) Treatment Segments: 4 Capillary Blood Glucose Pre Capillary Blood Glucose (mg/dl): Post Capillary Blood Glucose (mg/dl): Vital Signs Capillary Blood Glucose Reference Range: 80 - 120 mg / dl HBO Diabetic Blood Glucose Intervention Range: <131 mg/dl or >249 mg/dl Time Vitals Blood Respiratory Capillary Blood Glucose Pulse Action Type: Pulse: Temperature: Taken: Pressure: Rate: Glucose (mg/dl): Meter #: Oximetry (%) Taken: Pre 07:54 146/78 84 18 98.5 173 1 none Post 10:12 142/64 84 18 98.1 113 1 none Treatment Response Treatment Completion Status: Treatment Completed without Adverse Event [name], [name] ([id number]) HBO Attestation I certify that I supervised this HBO treatment  in accordance with Medicare guidelines. A trained Yes emergency response team is readily available per hospital policies and procedures. Continue HBOT as ordered. Yes Electronic Signature(s) Signed: 04/06/2015 10:35:16 AM By: Christin Fudge MD, FACS Entered By: Christin Fudge on 04/06/2015 10:35:16 [name], [name] ([id number]) -------------------------------------------------------------------------------- HBO Safety Checklist Details Patient Name: [name] Date of Service: 04/06/2015 8:00 AM Medical Record Number: [id number] Patient Account Number: [id number] Date of Birth/Sex: [birth year] (75 y.o. Male) Treating RN: Primary Care Physician: POLITE, RONALD Other Clinician: Jacqulyn Bath Referring Physician: POLITE, RONALD Treating Physician/Extender: Frann Rider in Treatment: 9 HBO Safety Checklist Items Safety Checklist Consent Form Signed Patient voided / foley secured and emptied When did you last eato 06:30 am Last dose of injectable or oral agent 06:30 am NA Ostomy pouch emptied and vented if applicable NA All implantable devices assessed, documented and approved NA Intravenous access site secured and place Valuables secured Linens and cotton and cotton/polyester blend (less than 51% polyester) Personal oil-based products / skin lotions / body lotions removed Wigs or hairpieces removed Smoking or tobacco materials removed Books / newspapers / magazines / loose paper removed Cologne, aftershave, perfume and deodorant removed Jewelry removed (may wrap wedding band) Make-up removed Hair care products removed Battery operated devices (external) removed Heating patches and chemical warmers removed NA Titanium eyewear removed NA Nail polish cured greater than 10 hours NA Casting material cured greater than 10 hours NA Hearing aids removed NA Loose dentures or partials removed NA Prosthetics have been removed Patient demonstrates correct use of air  break device (if applicable) Patient concerns have been addressed Patient grounding bracelet on and cord attached to chamber Specifics for Inpatients (complete in addition to above) Medication sheet sent with patient Intravenous medications needed or due during therapy sent with patient [name], [name] ([id number]) Drainage tubes (e.g. nasogastric  tube or chest tube secured and vented) Endotracheal or Tracheotomy tube secured Cuff deflated of air and inflated with saline Airway suctioned Electronic Signature(s) Signed: 04/06/2015 4:24:01 PM By: Lorine Bears RCP, RRT, CHT Entered By: Lorine Bears on 04/06/2015 08:16:55

## 2015-04-07 ENCOUNTER — Encounter: Payer: Medicare Other | Admitting: Internal Medicine

## 2015-04-07 ENCOUNTER — Encounter: Payer: BLUE CROSS/BLUE SHIELD | Admitting: Internal Medicine

## 2015-04-07 DIAGNOSIS — N3041 Irradiation cystitis with hematuria: Secondary | ICD-10-CM | POA: Diagnosis not present

## 2015-04-07 LAB — GLUCOSE, CAPILLARY
Glucose-Capillary: 201 mg/dL — ABNORMAL HIGH (ref 65–99)
Glucose-Capillary: 151 mg/dL — ABNORMAL HIGH (ref 65–99)

## 2015-04-07 NOTE — Progress Notes (Signed)
[name], [name] ([id number]) Visit Report for 04/07/2015 Arrival Information Details [name], [name] Date of Service: 04/07/2015 8:00 AM Patient Name: H. Patient Account Number: [id number] Medical Record Treating RN: [id number] Number: Other Clinician: Jacqulyn Bath Date of Birth/Sex: 12/18/1940 (75 y.o. Male) Treating ROBSON, Caro Primary Care Physician/Extender: G POLITE, RONALD Physician: Referring Physician: POLITE, RONALD Weeks in Treatment: 10 Visit Information History Since Last Visit Added or deleted any medications: No Patient Arrived: Ambulatory Any new allergies or adverse reactions: No Arrival Time: 07:50 Had a fall or experienced change in No Accompanied By: self activities of daily living that may affect Transfer Assistance: None risk of falls: Patient Identification Verified: Yes Signs or symptoms of abuse/neglect since last No Secondary Verification Process Yes visito Completed: Hospitalized since last visit: No Patient Requires Transmission-Based No Pain Present Now: No Precautions: Patient Has Alerts: No Electronic Signature(s) Signed: 04/07/2015 12:23:21 PM By: Lorine Bears RCP, RRT, CHT Entered By: Becky Sax, Amado Nash on 04/07/2015 08:10:13 [name] ([id number]) -------------------------------------------------------------------------------- Encounter Discharge Information Details [name] Date of Service: 04/07/2015 8:00 AM Patient Name: H. Patient Account Number: [id number] Medical Record Treating RN: [id number] Number: Other Clinician: Jacqulyn Bath Date of Birth/Sex: 23-Mar-1940 (75 y.o. Male) Treating ROBSON, MICHAEL Primary Care Physician/Extender: Rea [employer], RONALD Physician: Referring Physician: POLITE, RONALD Weeks in Treatment: 10 Encounter Discharge Information Items Discharge Pain Level: 0 Discharge Condition: Stable Ambulatory Status: Ambulatory Discharge Destination:  Home Private Transportation: Auto Accompanied By: self Schedule Follow-up Appointment: No Medication Reconciliation completed and No provided to Patient/Care Kenyon Eichelberger: Clinical Summary of Care: Notes Patient has an HBO treatment scheduled on 04/08/15 at 08:00 am. Electronic Signature(s) Signed: 04/07/2015 12:23:21 PM By: Lorine Bears RCP, RRT, CHT Entered By: Becky Sax, Amado Nash on 04/07/2015 10:27:20 [name], [name] ([id number]) -------------------------------------------------------------------------------- Vitals Details [name] Date of Service: 04/07/2015 8:00 AM Patient Name: H. Patient Account Number: [id number] Medical Record Treating RN: [id number] Number: Other Clinician: Jacqulyn Bath Date of Birth/Sex: [birth year] (75 y.o. Male) Treating ROBSON, MICHAEL Primary Care Physician/Extender: G POLITE, RONALD Physician: Referring Physician: POLITE, RONALD Weeks in Treatment: 10 Vital Signs Time Taken: 07:51 Temperature (F): 98.3 Height (in): 70 Pulse (bpm): 78 Weight (lbs): 243 Respiratory Rate (breaths/min): 18 Body Mass Index (BMI): 34.9 Blood Pressure (mmHg): 142/62 Capillary Blood Glucose (mg/dl): 201 Reference Range: 80 - 120 mg / dl Electronic Signature(s) Signed: 04/07/2015 12:23:21 PM By: Lorine Bears RCP, RRT, CHT Entered By: Lorine Bears on 04/07/2015 08:10:46

## 2015-04-08 ENCOUNTER — Encounter: Payer: Medicare Other | Admitting: Internal Medicine

## 2015-04-08 DIAGNOSIS — N3041 Irradiation cystitis with hematuria: Secondary | ICD-10-CM | POA: Diagnosis not present

## 2015-04-08 LAB — GLUCOSE, CAPILLARY
GLUCOSE-CAPILLARY: 154 mg/dL — AB (ref 65–99)
GLUCOSE-CAPILLARY: 173 mg/dL — AB (ref 65–99)
GLUCOSE-CAPILLARY: 217 mg/dL — AB (ref 65–99)
GLUCOSE-CAPILLARY: 165 mg/dL — AB (ref 65–99)

## 2015-04-09 ENCOUNTER — Encounter: Payer: Medicare Other | Admitting: Surgery

## 2015-04-09 DIAGNOSIS — N3041 Irradiation cystitis with hematuria: Secondary | ICD-10-CM | POA: Diagnosis not present

## 2015-04-09 LAB — GLUCOSE, CAPILLARY
GLUCOSE-CAPILLARY: 161 mg/dL — AB (ref 65–99)
Glucose-Capillary: 176 mg/dL — ABNORMAL HIGH (ref 65–99)

## 2015-04-09 NOTE — Progress Notes (Signed)
Jeremiah, [name] ([id number]) Visit Report for 04/08/2015 HBO Details [facility] Date of Service: 04/08/2015 8:00 AM Patient Name: H. Patient Account Number: [id number] Medical Record Treating RN: [id number] Number: Other Clinician: Jacqulyn Bath Date of Birth/Sex: 11-Feb-1940 (75 y.o. 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 HBO Treatment Details Treatment Number: 37 Patient Type: Outpatient Chamber Type: Monoplace Chamber Serial #: [id number] Treatment Protocol: 2.0 ATA with 90 minutes oxygen, and no air breaks Treatment Details Compression Rate Down: 1.5 psi / minute De-Compression Rate Up: 1.5 psi / minute Air breaks and breathing Compress Tx Pressure periods Decompress Decompress Begins Reached (leave unused spaces Begins Ends blank) Chamber Pressure (ATA) 1 2 - - - - - - 2 1 Clock Time (24 hr) 08:04 08:16 - - - - - - 09:46 09:56 Treatment Length: 112 (minutes) Treatment Segments: 4 Capillary Blood Glucose Pre Capillary Blood Glucose (mg/dl): Post Capillary Blood Glucose (mg/dl): Vital Signs Capillary Blood Glucose Reference Range: 80 - 120 mg / dl HBO Diabetic Blood Glucose Intervention Range: <131 mg/dl or >249 mg/dl Time Vitals Blood Respiratory Capillary Blood Glucose Pulse Action Type: Pulse: Temperature: Taken: Pressure: Rate: Glucose (mg/dl): Meter #: Oximetry (%) Taken: Pre 07:48 150/70 84 18 98.5 217 1 none Post 10:16 150/72 78 18 98.7 165 1 none Roller, Adeoluwa H. ([id number]) Treatment Response Treatment Completion Status: Treatment Completed without Adverse Event Physician Notes No concerns with treatment given HBO  Attestation I certify that I supervised this HBO treatment in accordance with Medicare guidelines. A trained Yes emergency response team is readily available per hospital policies and procedures. Continue HBOT as ordered. Yes Electronic Signature(s) Signed: 04/08/2015 4:53:46 PM By: Linton Ham MD Previous Signature: 04/08/2015 3:53:02 PM Version By: Becky Sax, Sallie RCP, RRT, CHT Entered By: Linton Ham on 04/08/2015 16:50:37 [name] ([id number]) -------------------------------------------------------------------------------- HBO Safety Checklist Details [facility] Date of Service: 04/08/2015 8:00 AM Patient Name: H. Patient Account Number: [id number] Medical Record Treating RN: [id number] Number: Other Clinician: Jacqulyn Bath Date of Birth/Sex: 07-Feb-1940 (75 y.o. Male) Treating ROBSON, MICHAEL Primary Care Physician/Extender: G POLITE, RONALD Physician: Referring Physician: POLITE, RONALD Weeks in Treatment: 10 HBO Safety Checklist Items Safety Checklist Consent Form Signed Patient voided / foley secured and emptied When did you last eato 06:30 am Last dose of injectable or oral agent 06:30 am NA Ostomy pouch emptied and vented if applicable NA All implantable devices assessed, documented and approved NA Intravenous access site secured and place Valuables secured Linens and cotton and cotton/polyester blend (less than 51% polyester) Personal oil-based products / skin lotions / body lotions removed Wigs or hairpieces removed Smoking or tobacco materials removed Books / newspapers / magazines / loose paper removed Cologne, aftershave, perfume and deodorant removed Jewelry removed (may wrap wedding band) Make-up removed Hair care products removed Battery operated devices (external) removed Heating patches and chemical warmers removed NA Titanium eyewear removed NA Nail polish cured greater than 10 hours NA Casting material cured  greater than 10 hours NA Hearing aids removed NA Loose dentures or partials removed NA Prosthetics have been removed Patient demonstrates correct use of air break device (if applicable) Patient concerns have been addressed Patient grounding bracelet on and cord attached to chamber Specifics for Inpatients (complete in addition to above) Medication sheet  sent with patient JENCARLO, BONADONNA ([id number]) Intravenous medications needed or due during therapy sent with patient Drainage tubes (e.g. nasogastric tube or chest tube secured and vented) Endotracheal or Tracheotomy tube secured Cuff deflated of air and inflated with saline Airway suctioned Electronic Signature(s) Signed: 04/08/2015 3:53:02 PM By: Lorine Bears RCP, RRT, CHT Entered By: Lorine Bears on 04/08/2015 07:57:22

## 2015-04-09 NOTE — Progress Notes (Signed)
Jeremiah [name] ([id number]) Visit Report for 04/07/2015 HBO Details [facility] Plan Date of Service: 04/07/2015 8:00 AM [name] Name: Jeremiah [name] Account Number: [id number] Medical Record Treating RN: [id number] Number: Other Clinician: Jacqulyn Bath Date of Birth/Sex: [birth year] (74 y.o. 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 HBO Treatment Details Treatment Number: 36 [name] Treatment Protocol: 2.0 ATA with 90 minutes oxygen, and no air breaks Treatment Details Compression Rate Down: 1.5 psi / minute De-Compression Rate Up: 1.5 psi / minute Air breaks and breathing Compress Tx Pressure periods Decompress Decompress Begins Reached (leave unused spaces Begins Ends blank) Chamber Pressure (ATA) 1 2 - - - - - - 2 1 Clock Time (24 hr) 08:09 08:21 - - - - - - 09:51 10:01 Treatment Length: 112 (minutes) Treatment Segments: 4 Capillary Blood Glucose Pre Capillary Blood Glucose (mg/dl): Post Capillary Blood Glucose (mg/dl): Vital Signs Capillary Blood Glucose Reference Range: 80 - 120 mg / dl HBO Diabetic Blood Glucose Intervention Range: <131 mg/dl or >249 mg/dl Time Vitals Blood Respiratory Capillary Blood Glucose Pulse Action Type: Pulse: Temperature: Taken: Pressure: Rate: Glucose (mg/dl): Meter #: Oximetry (%) Taken: Pre 07:51 142/62 78 18 98.3 201 1 none Post 10:16 142/86 78 18 98.5 151 1 none Jeremiah [name], [name] ([id number]) Treatment Response Treatment Completion Status: Treatment Completed without Adverse Event Physician Notes No concerns with treatment given HBO  Attestation I certify that I supervised this HBO treatment in accordance with Medicare guidelines. A trained Yes emergency response team is readily available per hospital policies and procedures. Continue HBOT as ordered. Yes Electronic Signature(s) Signed: 04/08/2015 4:53:46 PM By: Linton Ham MD Previous Signature: 04/07/2015 12:23:21 PM Version By: Becky Sax, Sallie RCP, RRT, CHT Entered By: Linton Ham on 04/07/2015 15:17:12 Jeremiah [name] ([id number]) -------------------------------------------------------------------------------- HBO Safety Checklist Details [facility] Plan Date of Service: 04/07/2015 8:00 AM [name] Name: Jeremiah [name] Account Number: [id number] Medical Record Treating RN: [id number] Number: Other Clinician: Jacqulyn Bath Date of Birth/Sex: 1940/11/07 (74 y.o. Male) Treating ROBSON, MICHAEL Primary Care Physician/Extender: G POLITE, RONALD Physician: Referring Physician: POLITE, RONALD Weeks in Treatment: 10 HBO Safety Checklist Items Safety Checklist Consent Form Signed [name] When did you last eato 06:30 am Last dose of injectable or oral agent 06:30 am NA Ostomy pouch emptied and vented if applicable NA All implantable devices assessed, documented and approved NA Intravenous access site secured and place Valuables secured Linens and cotton and cotton/polyester blend (less than 51% polyester) Personal oil-based products / skin lotions / body lotions removed Wigs or hairpieces removed Smoking or tobacco materials removed Books / newspapers / magazines / loose paper removed Cologne, aftershave, perfume and deodorant removed Jewelry removed (may wrap wedding band) Make-up removed Hair care products removed Battery operated devices (external) removed Heating patches and chemical warmers removed NA Titanium eyewear removed NA Nail polish cured greater than 10 hours NA Casting material cured  greater than 10 hours NA Hearing aids removed NA Loose dentures or partials removed NA Prosthetics have been removed [name] [name] [name] Specifics for Inpatients (complete in addition to above) Medication sheet  sent with [name] Jeremiah [name], Jeremiah [name] ([id number]) Intravenous medications needed or due during therapy sent with [name] Drainage tubes (e.g. nasogastric tube or chest tube secured and vented) Endotracheal or Tracheotomy tube secured Cuff deflated of air and inflated with saline Airway suctioned Electronic Signature(s) Signed: 04/07/2015 12:23:21 PM By: Lorine Bears RCP, RRT, CHT Entered By: Lorine Bears on 04/07/2015 08:11:36

## 2015-04-10 ENCOUNTER — Encounter: Payer: Medicare Other | Admitting: Surgery

## 2015-04-10 DIAGNOSIS — N3041 Irradiation cystitis with hematuria: Secondary | ICD-10-CM | POA: Diagnosis not present

## 2015-04-10 NOTE — Progress Notes (Signed)
ORVEL, CUTSFORTH ([id number]) Visit Report for 04/09/2015 Arrival Information Details Patient Name: [name], [name]. Date of Service: 04/09/2015 8:00 AM Medical Record Number: [id number] Patient Account Number: [id number] Date of Birth/Sex: [birth year] (75 y.o. Male) Treating RN: Primary Care Physician: POLITE, RONALD Other Clinician: Jacqulyn Bath Referring Physician: POLITE, RONALD Treating Physician/Extender: Frann Rider in Treatment: 10 Visit Information History Since Last Visit Added or deleted any medications: No Patient Arrived: Ambulatory Any new allergies or adverse reactions: No Arrival Time: 07:50 Had a fall or experienced change in No Accompanied By: self activities of daily living that may affect Transfer Assistance: None risk of falls: Patient Identification Verified: Yes Signs or symptoms of abuse/neglect since last No Secondary Verification Process Yes visito Completed: Hospitalized since last visit: No Patient Requires Transmission-Based No Pain Present Now: No Precautions: Patient Has Alerts: No Electronic Signature(s) Signed: 04/09/2015 5:15:11 PM By: Lorine Bears RCP, RRT, CHT Entered By: Lorine Bears on 04/09/2015 07:57:52 [name] ([id number]) -------------------------------------------------------------------------------- Encounter Discharge Information Details Patient Name: [name] Date of Service: 04/09/2015 8:00 AM Medical Record Number: [id number] Patient Account Number: [id number] Date of Birth/Sex: Oct 14, 1940 (75 y.o. Male) Treating RN: Primary Care Physician: POLITE, RONALD Other Clinician: Jacqulyn Bath Referring Physician: POLITE, RONALD Treating Physician/Extender: Frann Rider in Treatment: 10 Encounter Discharge Information Items Discharge Pain Level: 0 Discharge Condition: Stable Ambulatory Status: Ambulatory Discharge Destination:  Home Private Transportation: Auto Accompanied By: self Schedule Follow-up Appointment: No Medication Reconciliation completed and No provided to Patient/Care Janaiya Beauchesne: Clinical Summary of Care: Notes Patient has an HBO treatment scheduled on 04/10/15 at 08:00 am. Electronic Signature(s) Signed: 04/09/2015 5:15:11 PM By: Lorine Bears RCP, RRT, CHT Entered By: Lorine Bears on 04/09/2015 10:29:48 [name] ([id number]) -------------------------------------------------------------------------------- Vitals Details Patient Name: [name] Date of Service: 04/09/2015 8:00 AM Medical Record Number: [id number] Patient Account Number: [id number] Date of Birth/Sex: 05-Nov-1940 (75 y.o. Male) Treating RN: Primary Care Physician: POLITE, RONALD Other Clinician: Jacqulyn Bath Referring Physician: POLITE, RONALD Treating Physician/Extender: Frann Rider in Treatment: 10 Vital Signs Time Taken: 07:51 Temperature (F): 98.3 Height (in): 70 Pulse (bpm): 90 Weight (lbs): 243 Respiratory Rate (breaths/min): 18 Body Mass Index (BMI): 34.9 Blood Pressure (mmHg): 146/66 Capillary Blood Glucose (mg/dl): 176 Reference Range: 80 - 120 mg / dl Electronic Signature(s) Signed: 04/09/2015 5:15:11 PM By: Lorine Bears RCP, RRT, CHT Entered By: Becky Sax, Amado Nash on 04/09/2015 08:09:09

## 2015-04-10 NOTE — Progress Notes (Signed)
Jeremiah, [name] ([id number]) Visit Report for 04/09/2015 HBO Details Patient Name: [name], [name]. Date of Service: 04/09/2015 8:00 AM Medical Record Number: [id number] Patient Account Number: [id number] Date of Birth/Sex: 04-Apr-1940 (75 y.o. 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 HBO Treatment Details Treatment Number: 38 Patient Type: Outpatient Chamber Type: Monoplace Chamber Serial #: [id number] Treatment Protocol: 2.0 ATA with 90 minutes oxygen, and no air breaks Treatment Details Compression Rate Down: 1.5 psi / minute De-Compression Rate Up: 1.5 psi / minute Air breaks and breathing Compress Tx Pressure periods Decompress Decompress Begins Reached (leave unused spaces Begins Ends blank) Chamber Pressure (ATA) 1 2 - - - - - - 2 1 Clock Time (24 hr) 08:08 08:20 - - - - - - 09:50 10:00 Treatment Length: 112 (minutes) Treatment Segments: 4 Capillary Blood Glucose Pre Capillary Blood Glucose (mg/dl): Post Capillary Blood Glucose (mg/dl): Vital Signs Capillary Blood Glucose Reference Range: 80 - 120 mg / dl HBO Diabetic Blood Glucose Intervention Range: <131 mg/dl or >249 mg/dl Time Vitals Blood Respiratory Capillary Blood Glucose Pulse Action Type: Pulse: Temperature: Taken: Pressure: Rate: Glucose (mg/dl): Meter #: Oximetry (%) Taken: Pre 07:51 146/66 90 18 98.3 176 1 none Post 10:16 142/84 84 18 98.4 161 1 none Treatment Response Treatment Completion Status: Treatment Completed without Adverse Event MATEJ, SAPPENFIELD ([id number]) HBO Attestation I certify that I supervised this HBO treatment  in accordance with Medicare guidelines. A trained Yes emergency response team is readily available per hospital policies and procedures. Continue HBOT as ordered. Yes Electronic Signature(s) Signed: 04/09/2015 11:56:18 AM By: Christin Fudge MD, FACS Entered By: Christin Fudge on 04/09/2015 11:56:18 MAICO, MULVEHILL ([id number]) -------------------------------------------------------------------------------- HBO Safety Checklist Details Patient Name: [name] Date of Service: 04/09/2015 8:00 AM Medical Record Number: [id number] Patient Account Number: [id number] Date of Birth/Sex: Jul 05, 1940 (75 y.o. Male) Treating RN: Primary Care Physician: POLITE, RONALD Other Clinician: Jacqulyn Bath Referring Physician: POLITE, RONALD Treating Physician/Extender: Frann Rider in Treatment: 10 HBO Safety Checklist Items Safety Checklist Consent Form Signed Patient voided / foley secured and emptied When did you last eato 06:30 am Last dose of injectable or oral agent 06:30 am NA Ostomy pouch emptied and vented if applicable NA All implantable devices assessed, documented and approved NA Intravenous access site secured and place Valuables secured Linens and cotton and cotton/polyester blend (less than 51% polyester) Personal oil-based products / skin lotions / body lotions removed Wigs or hairpieces removed Smoking or tobacco materials removed Books / newspapers / magazines / loose paper removed Cologne, aftershave, perfume and deodorant removed Jewelry removed (may wrap wedding band) Make-up removed Hair care products removed Battery operated devices (external) removed Heating patches and chemical warmers removed NA Titanium eyewear removed NA Nail polish cured greater than 10 hours NA Casting material cured greater than 10 hours NA Hearing aids removed NA Loose dentures or partials removed NA Prosthetics have been removed Patient demonstrates correct use of air  break device (if applicable) Patient concerns have been addressed Patient grounding bracelet on and cord attached to chamber Specifics for Inpatients (complete in addition to above) Medication sheet sent with patient Intravenous medications needed or due during therapy sent with patient ZYLEN, WENIG ([id number]) Drainage tubes (e.g. nasogastric  tube or chest tube secured and vented) Endotracheal or Tracheotomy tube secured Cuff deflated of air and inflated with saline Airway suctioned Electronic Signature(s) Signed: 04/09/2015 5:15:11 PM By: Lorine Bears RCP, RRT, CHT Entered By: Becky Sax, Amado Nash on 04/09/2015 08:09:59

## 2015-04-11 NOTE — Progress Notes (Signed)
[name], [name] ([id number]) Visit Report for 04/10/2015 Arrival Information Details Patient Name: [name]. Date of Service: 04/10/2015 8:00 AM Medical Record Number: [id number] Patient Account Number: [id number] Date of Birth/Sex: [birth year] (75 y.o. Male) Treating RN: Primary Care Physician: POLITE, RONALD Other Clinician: Jacqulyn Bath Referring Physician: POLITE, RONALD Treating Physician/Extender: Frann Rider in Treatment: 10 Visit Information History Since Last Visit Added or deleted any medications: No Patient Arrived: Ambulatory Any new allergies or adverse reactions: No Arrival Time: 07:47 Had a fall or experienced change in No Accompanied By: self activities of daily living that may affect Transfer Assistance: None risk of falls: Patient Identification Verified: Yes Signs or symptoms of abuse/neglect since last No Secondary Verification Process Yes visito Completed: Hospitalized since last visit: No Patient Requires Transmission-Based No Pain Present Now: No Precautions: Patient Has Alerts: No Electronic Signature(s) Signed: 04/10/2015 4:05:00 PM By: Lorine Bears RCP, RRT, CHT Entered By: Lorine Bears on 04/10/2015 08:04:09 [name] ([id number]) -------------------------------------------------------------------------------- Encounter Discharge Information Details Patient Name: [name] Date of Service: 04/10/2015 8:00 AM Medical Record Number: [id number] Patient Account Number: [id number] Date of Birth/Sex: Feb 10, 1940 (75 y.o. Male) Treating RN: Primary Care Physician: POLITE, RONALD Other Clinician: Jacqulyn Bath Referring Physician: POLITE, RONALD Treating Physician/Extender: Frann Rider in Treatment: 10 Encounter Discharge Information Items Discharge Pain Level: 0 Discharge Condition: Stable Ambulatory Status: Ambulatory Discharge Destination:  Home Private Transportation: Auto Accompanied By: self Schedule Follow-up Appointment: No Medication Reconciliation completed and No provided to Patient/Care Deyana Wnuk: Clinical Summary of Care: Notes Patient has his final HBO treatment scheduled on 04/13/15 at 08:00 am. Electronic Signature(s) Signed: 04/10/2015 4:05:00 PM By: Lorine Bears RCP, RRT, CHT Entered By: Lorine Bears on 04/10/2015 10:26:45 [name] ([id number]) -------------------------------------------------------------------------------- Vitals Details Patient Name: [name] Date of Service: 04/10/2015 8:00 AM Medical Record Number: [id number] Patient Account Number: [id number] Date of Birth/Sex: 1940/10/01 (75 y.o. Male) Treating RN: Primary Care Physician: POLITE, RONALD Other Clinician: Jacqulyn Bath Referring Physician: POLITE, RONALD Treating Physician/Extender: Frann Rider in Treatment: 10 Vital Signs Time Taken: 07:49 Temperature (F): 97.8 Height (in): 70 Pulse (bpm): 84 Weight (lbs): 243 Respiratory Rate (breaths/min): 18 Body Mass Index (BMI): 34.9 Blood Pressure (mmHg): 148/68 Capillary Blood Glucose (mg/dl): 169 Reference Range: 80 - 120 mg / dl Electronic Signature(s) Signed: 04/10/2015 4:05:00 PM By: Lorine Bears RCP, RRT, CHT Entered By: Becky Sax, Amado Nash on 04/10/2015 08:04:38

## 2015-04-11 NOTE — Progress Notes (Signed)
ZYLON, CREAMER ([id number]) Visit Report for 04/10/2015 HBO Details Patient Name: [name], [name]. Date of Service: 04/10/2015 8:00 AM Medical Record Number: [id number] Patient Account Number: [id number] Date of Birth/Sex: Jul 10, 1940 (75 y.o. 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 HBO Treatment Details Treatment Number: 39 Patient Type: Outpatient Chamber Type: Monoplace Chamber Serial #: [id number] Treatment Protocol: 2.0 ATA with 90 minutes oxygen, and no air breaks Treatment Details Compression Rate Down: 1.5 psi / minute De-Compression Rate Up: 1.5 psi / minute Air breaks and breathing Compress Tx Pressure periods Decompress Decompress Begins Reached (leave unused spaces Begins Ends blank) Chamber Pressure (ATA) 1 2 - - - - - - 2 1 Clock Time (24 hr) 08:02 08:14 - - - - - - 09:46 09:56 Treatment Length: 114 (minutes) Treatment Segments: 4 Capillary Blood Glucose Pre Capillary Blood Glucose (mg/dl): Post Capillary Blood Glucose (mg/dl): Vital Signs Capillary Blood Glucose Reference Range: 80 - 120 mg / dl HBO Diabetic Blood Glucose Intervention Range: <131 mg/dl or >249 mg/dl Time Vitals Blood Respiratory Capillary Blood Glucose Pulse Action Type: Pulse: Temperature: Taken: Pressure: Rate: Glucose (mg/dl): Meter #: Oximetry (%) Taken: Pre 07:49 148/68 84 18 97.8 169 1 none Post 10:00 162/86 90 18 98.5 152 1 none Treatment Response Treatment Completion Status: Treatment Completed without Adverse Event EUCLID, CASSETTA ([id number]) HBO Attestation I certify that I supervised this HBO treatment  in accordance with Medicare guidelines. A trained Yes emergency response team is readily available per hospital policies and procedures. Continue HBOT as ordered. Yes Electronic Signature(s) Signed: 04/10/2015 2:03:40 PM By: Christin Fudge MD, FACS Entered By: Christin Fudge on 04/10/2015 14:03:39 OMARIAN, JAQUITH ([id number]) -------------------------------------------------------------------------------- HBO Safety Checklist Details Patient Name: [name] Date of Service: 04/10/2015 8:00 AM Medical Record Number: [id number] Patient Account Number: [id number] Date of Birth/Sex: [birth year] (75 y.o. Male) Treating RN: Primary Care Physician: POLITE, RONALD Other Clinician: Jacqulyn Bath Referring Physician: POLITE, RONALD Treating Physician/Extender: Frann Rider in Treatment: 10 HBO Safety Checklist Items Safety Checklist Consent Form Signed Patient voided / foley secured and emptied When did you last eato 06:30 am Last dose of injectable or oral agent 06:30 am NA Ostomy pouch emptied and vented if applicable NA All implantable devices assessed, documented and approved NA Intravenous access site secured and place Valuables secured Linens and cotton and cotton/polyester blend (less than 51% polyester) Personal oil-based products / skin lotions / body lotions removed Wigs or hairpieces removed Smoking or tobacco materials removed Books / newspapers / magazines / loose paper removed Cologne, aftershave, perfume and deodorant removed Jewelry removed (may wrap wedding band) Make-up removed Hair care products removed Battery operated devices (external) removed Heating patches and chemical warmers removed NA Titanium eyewear removed NA Nail polish cured greater than 10 hours NA Casting material cured greater than 10 hours NA Hearing aids removed NA Loose dentures or partials removed NA Prosthetics have been removed Patient demonstrates correct use of air  break device (if applicable) Patient concerns have been addressed Patient grounding bracelet on and cord attached to chamber Specifics for Inpatients (complete in addition to above) Medication sheet sent with patient Intravenous medications needed or due during therapy sent with patient TAJAE, RYBICKI ([id number]) Drainage tubes (e.g. nasogastric  tube or chest tube secured and vented) Endotracheal or Tracheotomy tube secured Cuff deflated of air and inflated with saline Airway suctioned Electronic Signature(s) Signed: 04/10/2015 4:05:00 PM By: Lorine Bears RCP, RRT, CHT Entered By: Lorine Bears on 04/10/2015 08:05:33

## 2015-04-13 ENCOUNTER — Encounter: Payer: Medicare Other | Admitting: Surgery

## 2015-04-13 DIAGNOSIS — N3041 Irradiation cystitis with hematuria: Secondary | ICD-10-CM | POA: Diagnosis not present

## 2015-04-13 LAB — GLUCOSE, CAPILLARY
Glucose-Capillary: 169 mg/dL — ABNORMAL HIGH (ref 65–99)
Glucose-Capillary: 152 mg/dL — ABNORMAL HIGH (ref 65–99)
Glucose-Capillary: 259 mg/dL — ABNORMAL HIGH (ref 65–99)
Glucose-Capillary: 231 mg/dL — ABNORMAL HIGH (ref 65–99)

## 2015-04-13 NOTE — Progress Notes (Signed)
GIULIANO, PREECE ([id number]) Visit Report for 04/13/2015 HBO Details Patient Name: [name], [name]. Date of Service: 04/13/2015 8:00 AM Medical Record Number: [id number] Patient Account Number: [id number] Date of Birth/Sex: 1940-10-06 (75 y.o. 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 End Date: Treatment Series Complete; Non- HBO Discharge Wound Protocol Completed with Outcome: Symptom Relief HBO Treatment Details Treatment Number: 40 Patient Type: Outpatient Chamber Type: Monoplace Chamber Serial #: [id number] Treatment Protocol: 2.0 ATA with 90 minutes oxygen, and no air breaks Treatment Details Compression Rate Down: 1.5 psi / minute De-Compression Rate Up: 1.5 psi / minute Air breaks and breathing Compress Tx Pressure periods Decompress Decompress Begins Reached (leave unused spaces Begins Ends blank) Chamber Pressure (ATA) 1 2 - - - - - - 2 1 Clock Time (24 hr) 08:13 08:25 - - - - - - 09:55 10:05 Treatment Length: 112 (minutes) Treatment Segments: 4 Capillary Blood Glucose Pre Capillary Blood Glucose (mg/dl): Post Capillary Blood Glucose (mg/dl): Vital Signs Capillary Blood Glucose Reference Range: 80 - 120 mg / dl HBO Diabetic Blood Glucose Intervention Range: <131 mg/dl or >249 mg/dl Time Vitals Blood Respiratory Capillary Blood Glucose Pulse Action Type: Pulse: Temperature: Taken: Pressure: Rate: Glucose (mg/dl): Meter #: Oximetry (%) Taken: Pre 08:08 158/68 78 18 98.5 259 1 none Post 10:09 142/84 84 18 98.5 231 1 none Doner, Kaylob H. ([id number]) Treatment  Response Treatment Well Toleration: Treatment Treatment Completed without Adverse Event Completion Status: Electronic Signature(s) Signed: 04/13/2015 3:30:49 PM By: Paulla Fore, RRT, CHT Signed: 04/13/2015 4:11:43 PM By: Christin Fudge MD, FACS Previous Signature: 04/13/2015 10:06:42 AM Version By: Christin Fudge MD, FACS Entered By: Lorine Bears on 04/13/2015 10:20:27 EBRIMA, RANTA ([id number]) -------------------------------------------------------------------------------- HBO Safety Checklist Details Patient Name: ASANI, MCBURNEY. Date of Service: 04/13/2015 8:00 AM Medical Record Number: [id number] Patient Account Number: [id number] Date of Birth/Sex: [birth year] (75 y.o. Male) Treating RN: Primary Care Physician: POLITE, RONALD Other Clinician: Jacqulyn Bath Referring Physician: POLITE, RONALD Treating Physician/Extender: Frann Rider in Treatment: 10 HBO Safety Checklist Items Safety Checklist Consent Form Signed Patient voided / foley secured and emptied When did you last eato 06:30 am Last dose of injectable or oral agent 06:30 am NA Ostomy pouch emptied and vented if applicable NA All implantable devices assessed, documented and approved NA Intravenous access site secured and place Valuables secured Linens and cotton and cotton/polyester blend (less than 51% polyester) Personal oil-based products / skin lotions / body lotions removed Wigs or hairpieces removed Smoking or tobacco materials removed Books / newspapers / magazines / loose paper removed Cologne, aftershave, perfume and deodorant removed Jewelry removed (may wrap wedding band) Make-up removed Hair care products removed Battery operated devices (external) removed Heating patches and chemical warmers removed NA Titanium eyewear removed NA Nail polish cured greater than 10 hours NA Casting material cured greater than 10 hours NA Hearing aids removed NA  Loose dentures or partials removed NA Prosthetics have been removed Patient demonstrates correct use of air break device (if applicable) Patient concerns have been addressed Patient grounding bracelet on and cord attached to chamber Specifics for Inpatients (complete in addition to above) Medication sheet sent with patient Intravenous medications needed or due during therapy sent  with patient LYNDALL, WINDT ([id number]) Drainage tubes (e.g. nasogastric tube or chest tube secured and vented) Endotracheal or Tracheotomy tube secured Cuff deflated of air and inflated with saline Airway suctioned Electronic Signature(s) Signed: 04/13/2015 3:30:49 PM By: Lorine Bears RCP, RRT, CHT Entered By: Becky Sax, Amado Nash on 04/13/2015 08:15:42

## 2015-04-13 NOTE — Progress Notes (Signed)
XION, DEBRUYNE ([id number]) Visit Report for 04/13/2015 Arrival Information Details Patient Name: [name], [name]. Date of Service: 04/13/2015 8:00 AM Medical Record Number: [id number] Patient Account Number: [id number] Date of Birth/Sex: February 21, 1940 (75 y.o. Male) Treating RN: Primary Care Physician: POLITE, RONALD Other Clinician: Jacqulyn Bath Referring Physician: POLITE, RONALD Treating Physician/Extender: Frann Rider in Treatment: 10 Visit Information History Since Last Visit Added or deleted any medications: No Patient Arrived: Ambulatory Any new allergies or adverse reactions: No Arrival Time: 08:00 Signs or symptoms of abuse/neglect since last No Accompanied By: self visito Transfer Assistance: None Hospitalized since last visit: No Patient Identification Verified: Yes Pain Present Now: No Secondary Verification Process Yes Completed: Patient Requires Transmission-Based No Precautions: Patient Has Alerts: No Electronic Signature(s) Signed: 04/13/2015 3:30:49 PM By: Lorine Bears RCP, RRT, CHT Entered By: Lorine Bears on 04/13/2015 08:14:05 [name] ([id number]) -------------------------------------------------------------------------------- Encounter Discharge Information Details Patient Name: [name] Date of Service: 04/13/2015 8:00 AM Medical Record Number: [id number] Patient Account Number: [id number] Date of Birth/Sex: [birth year] (75 y.o. Male) Treating RN: Primary Care Physician: POLITE, RONALD Other Clinician: Jacqulyn Bath Referring Physician: POLITE, RONALD Treating Physician/Extender: Frann Rider in Treatment: 10 Encounter Discharge Information Items Discharge Pain Level: 0 Discharge Condition: Stable Ambulatory Status: Ambulatory Discharge Destination: Home Private Transportation: Auto Accompanied By: self Schedule Follow-up Appointment: No Medication Reconciliation  completed and No provided to Patient/Care Oree Mirelez: Clinical Summary of Care: Notes Patient has completed his course of 40 HBO treatments. Electronic Signature(s) Signed: 04/13/2015 3:30:49 PM By: Lorine Bears RCP, RRT, CHT Entered By: Lorine Bears on 04/13/2015 10:31:27 ISACK, LAVALLEY ([id number]) -------------------------------------------------------------------------------- Vitals Details Patient Name: [name] Date of Service: 04/13/2015 8:00 AM Medical Record Number: [id number] Patient Account Number: [id number] Date of Birth/Sex: 1941/01/26 (75 y.o. Male) Treating RN: Primary Care Physician: POLITE, RONALD Other Clinician: Jacqulyn Bath Referring Physician: POLITE, RONALD Treating Physician/Extender: Frann Rider in Treatment: 10 Vital Signs Time Taken: 08:08 Temperature (F): 98.5 Height (in): 70 Pulse (bpm): 78 Weight (lbs): 243 Respiratory Rate (breaths/min): 18 Body Mass Index (BMI): 34.9 Blood Pressure (mmHg): 158/68 Capillary Blood Glucose (mg/dl): 259 Reference Range: 80 - 120 mg / dl Electronic Signature(s) Signed: 04/13/2015 3:30:49 PM By: Lorine Bears RCP, RRT, CHT Entered By: Lorine Bears on 04/13/2015 08:14:53

## 2015-04-14 ENCOUNTER — Encounter: Payer: BLUE CROSS/BLUE SHIELD | Admitting: Internal Medicine

## 2015-04-14 DIAGNOSIS — N3041 Irradiation cystitis with hematuria: Secondary | ICD-10-CM | POA: Diagnosis not present

## 2015-04-15 ENCOUNTER — Encounter: Payer: BLUE CROSS/BLUE SHIELD | Admitting: Internal Medicine

## 2015-04-15 LAB — GLUCOSE, CAPILLARY: GLUCOSE-CAPILLARY: 165 mg/dL — AB (ref 65–99)

## 2015-04-16 ENCOUNTER — Encounter: Payer: BLUE CROSS/BLUE SHIELD | Admitting: Surgery

## 2015-04-17 ENCOUNTER — Encounter: Payer: BLUE CROSS/BLUE SHIELD | Admitting: Surgery

## 2015-06-16 ENCOUNTER — Ambulatory Visit
Admission: RE | Admit: 2015-06-16 | Discharge: 2015-06-16 | Disposition: A | Discharge status: Home / Self Care | Payer: Medicare Other | Source: Ambulatory Visit | Attending: Orthopedic Surgery | Admitting: Orthopedic Surgery

## 2015-06-16 ENCOUNTER — Other Ambulatory Visit: Payer: Self-pay | Admitting: Orthopedic Surgery

## 2015-06-16 DIAGNOSIS — M170 Bilateral primary osteoarthritis of knee: Secondary | ICD-10-CM | POA: Insufficient documentation

## 2015-06-16 DIAGNOSIS — R52 Pain, unspecified: Secondary | ICD-10-CM

## 2015-06-16 DIAGNOSIS — M25562 Pain in left knee: Secondary | ICD-10-CM | POA: Insufficient documentation

## 2015-06-16 DIAGNOSIS — M25561 Pain in right knee: Secondary | ICD-10-CM | POA: Insufficient documentation

## 2015-10-01 ENCOUNTER — Other Ambulatory Visit: Payer: Self-pay | Admitting: Internal Medicine

## 2015-10-01 DIAGNOSIS — R918 Other nonspecific abnormal finding of lung field: Secondary | ICD-10-CM

## 2015-10-12 ENCOUNTER — Ambulatory Visit
Admission: RE | Admit: 2015-10-12 | Discharge: 2015-10-12 | Disposition: A | Discharge status: Home / Self Care | Payer: Medicare Other | Source: Ambulatory Visit | Attending: Internal Medicine | Admitting: Internal Medicine

## 2015-10-12 DIAGNOSIS — C787 Secondary malignant neoplasm of liver and intrahepatic bile duct: Secondary | ICD-10-CM | POA: Diagnosis not present

## 2015-10-12 DIAGNOSIS — C781 Secondary malignant neoplasm of mediastinum: Secondary | ICD-10-CM | POA: Diagnosis not present

## 2015-10-12 DIAGNOSIS — C771 Secondary and unspecified malignant neoplasm of intrathoracic lymph nodes: Secondary | ICD-10-CM | POA: Insufficient documentation

## 2015-10-12 DIAGNOSIS — C7800 Secondary malignant neoplasm of unspecified lung: Secondary | ICD-10-CM | POA: Insufficient documentation

## 2015-10-12 DIAGNOSIS — I2510 Atherosclerotic heart disease of native coronary artery without angina pectoris: Secondary | ICD-10-CM | POA: Insufficient documentation

## 2015-10-12 DIAGNOSIS — R918 Other nonspecific abnormal finding of lung field: Secondary | ICD-10-CM | POA: Insufficient documentation

## 2015-10-12 DIAGNOSIS — I700 Atherosclerosis of aorta: Secondary | ICD-10-CM | POA: Diagnosis not present

## 2015-10-15 ENCOUNTER — Other Ambulatory Visit: Payer: Self-pay | Admitting: Internal Medicine

## 2015-10-15 DIAGNOSIS — R160 Hepatomegaly, not elsewhere classified: Secondary | ICD-10-CM

## 2015-10-20 ENCOUNTER — Other Ambulatory Visit: Payer: Self-pay | Admitting: Internal Medicine

## 2015-10-20 ENCOUNTER — Encounter: Payer: Self-pay | Admitting: Internal Medicine

## 2015-10-20 ENCOUNTER — Inpatient Hospital Stay: Payer: Medicare Other | Attending: Internal Medicine | Admitting: Internal Medicine

## 2015-10-20 ENCOUNTER — Telehealth: Payer: Self-pay | Admitting: Internal Medicine

## 2015-10-20 ENCOUNTER — Ambulatory Visit
Admission: RE | Admit: 2015-10-20 | Discharge: 2015-10-20 | Disposition: A | Discharge status: Home / Self Care | Payer: Medicare Other | Source: Ambulatory Visit | Attending: Internal Medicine | Admitting: Internal Medicine

## 2015-10-20 ENCOUNTER — Other Ambulatory Visit: Payer: Self-pay | Admitting: *Deleted

## 2015-10-20 ENCOUNTER — Inpatient Hospital Stay: Payer: Medicare Other

## 2015-10-20 VITALS — BP 146/86 | HR 78 | Temp 97.8°F | Resp 20 | Ht 72.0 in | Wt 228.0 lb

## 2015-10-20 DIAGNOSIS — Z8546 Personal history of malignant neoplasm of prostate: Secondary | ICD-10-CM | POA: Insufficient documentation

## 2015-10-20 DIAGNOSIS — Z803 Family history of malignant neoplasm of breast: Secondary | ICD-10-CM | POA: Insufficient documentation

## 2015-10-20 DIAGNOSIS — E119 Type 2 diabetes mellitus without complications: Secondary | ICD-10-CM | POA: Insufficient documentation

## 2015-10-20 DIAGNOSIS — C7951 Secondary malignant neoplasm of bone: Secondary | ICD-10-CM | POA: Insufficient documentation

## 2015-10-20 DIAGNOSIS — R591 Generalized enlarged lymph nodes: Secondary | ICD-10-CM | POA: Diagnosis not present

## 2015-10-20 DIAGNOSIS — C787 Secondary malignant neoplasm of liver and intrahepatic bile duct: Secondary | ICD-10-CM | POA: Insufficient documentation

## 2015-10-20 DIAGNOSIS — I2510 Atherosclerotic heart disease of native coronary artery without angina pectoris: Secondary | ICD-10-CM | POA: Insufficient documentation

## 2015-10-20 DIAGNOSIS — I700 Atherosclerosis of aorta: Secondary | ICD-10-CM | POA: Diagnosis not present

## 2015-10-20 DIAGNOSIS — Z8041 Family history of malignant neoplasm of ovary: Secondary | ICD-10-CM | POA: Diagnosis not present

## 2015-10-20 DIAGNOSIS — E785 Hyperlipidemia, unspecified: Secondary | ICD-10-CM | POA: Insufficient documentation

## 2015-10-20 DIAGNOSIS — Z8042 Family history of malignant neoplasm of prostate: Secondary | ICD-10-CM | POA: Diagnosis not present

## 2015-10-20 DIAGNOSIS — Z794 Long term (current) use of insulin: Secondary | ICD-10-CM | POA: Diagnosis not present

## 2015-10-20 DIAGNOSIS — R918 Other nonspecific abnormal finding of lung field: Secondary | ICD-10-CM | POA: Insufficient documentation

## 2015-10-20 DIAGNOSIS — Z79899 Other long term (current) drug therapy: Secondary | ICD-10-CM | POA: Diagnosis not present

## 2015-10-20 DIAGNOSIS — M1990 Unspecified osteoarthritis, unspecified site: Secondary | ICD-10-CM | POA: Insufficient documentation

## 2015-10-20 DIAGNOSIS — D509 Iron deficiency anemia, unspecified: Secondary | ICD-10-CM | POA: Insufficient documentation

## 2015-10-20 DIAGNOSIS — Z801 Family history of malignant neoplasm of trachea, bronchus and lung: Secondary | ICD-10-CM | POA: Insufficient documentation

## 2015-10-20 DIAGNOSIS — I313 Pericardial effusion (noninflammatory): Secondary | ICD-10-CM | POA: Insufficient documentation

## 2015-10-20 DIAGNOSIS — N400 Enlarged prostate without lower urinary tract symptoms: Secondary | ICD-10-CM | POA: Insufficient documentation

## 2015-10-20 DIAGNOSIS — C801 Malignant (primary) neoplasm, unspecified: Secondary | ICD-10-CM | POA: Insufficient documentation

## 2015-10-20 DIAGNOSIS — K219 Gastro-esophageal reflux disease without esophagitis: Secondary | ICD-10-CM | POA: Insufficient documentation

## 2015-10-20 DIAGNOSIS — C7800 Secondary malignant neoplasm of unspecified lung: Secondary | ICD-10-CM | POA: Insufficient documentation

## 2015-10-20 DIAGNOSIS — D500 Iron deficiency anemia secondary to blood loss (chronic): Secondary | ICD-10-CM

## 2015-10-20 DIAGNOSIS — K5730 Diverticulosis of large intestine without perforation or abscess without bleeding: Secondary | ICD-10-CM | POA: Insufficient documentation

## 2015-10-20 DIAGNOSIS — C3431 Malignant neoplasm of lower lobe, right bronchus or lung: Secondary | ICD-10-CM | POA: Insufficient documentation

## 2015-10-20 DIAGNOSIS — Z87891 Personal history of nicotine dependence: Secondary | ICD-10-CM | POA: Insufficient documentation

## 2015-10-20 DIAGNOSIS — I10 Essential (primary) hypertension: Secondary | ICD-10-CM | POA: Diagnosis not present

## 2015-10-20 DIAGNOSIS — C61 Malignant neoplasm of prostate: Secondary | ICD-10-CM | POA: Diagnosis not present

## 2015-10-20 DIAGNOSIS — R160 Hepatomegaly, not elsewhere classified: Secondary | ICD-10-CM | POA: Diagnosis present

## 2015-10-20 DIAGNOSIS — Z7951 Long term (current) use of inhaled steroids: Secondary | ICD-10-CM | POA: Diagnosis not present

## 2015-10-20 DIAGNOSIS — Z923 Personal history of irradiation: Secondary | ICD-10-CM | POA: Insufficient documentation

## 2015-10-20 DIAGNOSIS — Z7984 Long term (current) use of oral hypoglycemic drugs: Secondary | ICD-10-CM | POA: Diagnosis not present

## 2015-10-20 DIAGNOSIS — R600 Localized edema: Secondary | ICD-10-CM | POA: Insufficient documentation

## 2015-10-20 DIAGNOSIS — K769 Liver disease, unspecified: Secondary | ICD-10-CM | POA: Diagnosis not present

## 2015-10-20 DIAGNOSIS — N62 Hypertrophy of breast: Secondary | ICD-10-CM | POA: Diagnosis not present

## 2015-10-20 LAB — CBC WITH DIFFERENTIAL/PLATELET
BASOS PCT: 0 %
Basophils Absolute: 0 10*3/uL (ref 0–0.1)
EOS ABS: 0.1 10*3/uL (ref 0–0.7)
EOS PCT: 2 %
HCT: 36.7 % — ABNORMAL LOW (ref 40.0–52.0)
HEMOGLOBIN: 11.8 g/dL — AB (ref 13.0–18.0)
LYMPHS ABS: 1.1 10*3/uL (ref 1.0–3.6)
Lymphocytes Relative: 22 %
MCH: 24.5 pg — ABNORMAL LOW (ref 26.0–34.0)
MCHC: 32.1 g/dL (ref 32.0–36.0)
MCV: 76.1 fL — ABNORMAL LOW (ref 80.0–100.0)
MONOS PCT: 12 %
Monocytes Absolute: 0.6 10*3/uL (ref 0.2–1.0)
Neutro Abs: 3.1 10*3/uL (ref 1.4–6.5)
Neutrophils Relative %: 65 %
PLATELETS: 143 10*3/uL — AB (ref 150–440)
RBC: 4.82 MIL/uL (ref 4.40–5.90)
RDW: 18.2 % — ABNORMAL HIGH (ref 11.5–14.5)
WBC: 4.9 10*3/uL (ref 3.8–10.6)

## 2015-10-20 LAB — COMPREHENSIVE METABOLIC PANEL
ALT: 99 U/L — AB (ref 17–63)
AST: 103 U/L — AB (ref 15–41)
Albumin: 3.8 g/dL (ref 3.5–5.0)
Alkaline Phosphatase: 197 U/L — ABNORMAL HIGH (ref 38–126)
Anion gap: 10 (ref 5–15)
BILIRUBIN TOTAL: 0.6 mg/dL (ref 0.3–1.2)
BUN: 16 mg/dL (ref 6–20)
CO2: 25 mmol/L (ref 22–32)
CREATININE: 0.89 mg/dL (ref 0.61–1.24)
Calcium: 9.7 mg/dL (ref 8.9–10.3)
Chloride: 103 mmol/L (ref 101–111)
Glucose, Bld: 154 mg/dL — ABNORMAL HIGH (ref 65–99)
POTASSIUM: 4.3 mmol/L (ref 3.5–5.1)
Sodium: 138 mmol/L (ref 135–145)
TOTAL PROTEIN: 7 g/dL (ref 6.5–8.1)

## 2015-10-20 LAB — IRON AND TIBC
IRON: 48 ug/dL (ref 45–182)
Saturation Ratios: 15 % — ABNORMAL LOW (ref 17.9–39.5)
TIBC: 331 ug/dL (ref 250–450)
UIBC: 283 ug/dL

## 2015-10-20 LAB — FERRITIN: FERRITIN: 369 ng/mL — AB (ref 24–336)

## 2015-10-20 LAB — LACTATE DEHYDROGENASE: LDH: 620 U/L — ABNORMAL HIGH (ref 98–192)

## 2015-10-20 MED ORDER — IOPAMIDOL (ISOVUE-370) INJECTION 76%
125.0000 mL | Freq: Once | INTRAVENOUS | Status: AC | PRN
  Administered 2015-10-20: 125 mL via INTRAVENOUS

## 2015-10-20 NOTE — Assessment & Plan Note (Addendum)
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 Reviewed the imaging myself and with interventional radiology- liver liver lesions most accessible to biopsy. Biopsies has been ordered.  # Recommend checking CBC CMP; tumor markers including CEA CA-19-9 LDH  # History of iron deficiency anemia- unclear source; check CBC and iron studies ferritin; possible need for IV iron is significantly low. Was also discussed.  # Bilateral lower extremity swelling- Bil LE venous Dopplers negative for DVT.  # Patient follow-up in approximately few days after the biopsy; patient's contact/preference [phone]/mobile [wife/ Jane].  Thank you Dr. Doy Hutching for allowing me to participate in the care of your pleasant patient. Please do not hesitate to contact me with questions or concerns in the interim.  # 60 minutes face-to-face with the patient discussing the above plan of care; more than 50% of time spent on prognosis/ natural history; counseling and coordination.

## 2015-10-20 NOTE — Progress Notes (Signed)
Presented with persistent cough x 5-6 months.  Pt finally was evaluated by ENT Dr. Charolett Bumpers. No etiology at that time was found to determine cause of cough.  Pt went through a series of allergy testing to determine if cough was r/t to allergies.  Evaluated by PCP-Dr. Doy Hutching. MD ordered a cxr and then ct scan to determine cause of abnormality per wife.  C/o of constipation. Patient uses prune juice and eats prunes to aide constipation.  Last bm yesterday.  Denies any nausea and vomiting .  Patient excellent appetite denies any wt loss.  Wt stable-per pt's wife.  4 weeks ago experienced 1 episode of nausea without vomiting but the urge went away. Nausea at that time relieved with laxative interventions.  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  tx with TURP/ cysto.  H/o rad tx for prostate canncer. Report incontinence.  Weakness.  Gerd, lower ext bil edema. 1+ pitting edema

## 2015-10-20 NOTE — Progress Notes (Signed)
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  male with prior history of prostate cancer status post radiation in 2015; has been referred to for further evaluation multiple lung nodules; and also multiple liver lesions concerning for malignancy.  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 He had previously needed blood transfusion.  However more recently patient noted to have worsening cough which was thought to be from allergies; however since cough did not improve this was further worked up with a chest x-ray that was abnormal; and that led to CT scan as described above.  Interestingly patient denies any significant pain. Denies any loss of appetite. Denies any weight loss. He does complain of cough no hemoptysis. Complains of shortness of breath with exertion. He also noted to have swelling in his legs for the last few months especially dependent.  Patient denies any blood in stools or black stools. He had colonoscopy in 2010. He does complain of fatigue. General his weakness...    ROS: A complete 10 point review of  system is done which is negative except mentioned above in history of present illness  MEDICAL HISTORY:  Past Medical History:  Diagnosis Date  . Arthritis   . GERD (gastroesophageal reflux disease)   . Hyperlipidemia   . Hypertension   . Prostate cancer ([place]) 03/2013   had seed implant and radiation for 5 weeks  . Type 2 diabetes mellitus ([place])   . Wears glasses     SURGICAL HISTORY: Past Surgical History:  Procedure Laterality Date  . COLONOSCOPY  2010  . CYSTOSCOPY N/A 07/04/2013   Procedure: CYSTOSCOPY FLEXIBLE;  Surgeon: Claybon Jabs, MD;  Location: [facility];  Service: Urology;  Laterality: N/A;  . INGUINAL HERNIA REPAIR Left 2005  . PROSTATE BIOPSY    . RADIOACTIVE SEED IMPLANT N/A 07/04/2013   Procedure: RADIOACTIVE SEED IMPLANT;  Surgeon: Claybon Jabs, MD;  Location: [facility];  Service: Urology;  Laterality: N/A;  . REPAIR CHRONIC INCARCERATED RECURRENT LEFT INGUINAL HERNIA  10-23-2008  . TRANSURETHRAL RESECTION OF BLADDER TUMOR N/A 01/02/2015   Procedure: Cystoscopy clot evalucation and fulgration;  Surgeon: Kathie Rhodes, MD;  Location: [facility];  Service: Urology;  Laterality: N/A;    SOCIAL HISTORY: former smoker-- quit 6-7 years; no alcohol; used to [place]/ [place]/ [place];. Lives with wife..  Social History   Social History  . Marital status: Married    Spouse name: N/A  . Number of children: N/A  . Years of education: N/A   Occupational History  . Not on file.   Social History Main Topics  .  Smoking status: Former Smoker    Packs/day: 1.00    Years: 30.00    Types: Cigarettes    Quit date: 02/01/2008  . Smokeless tobacco: Never Used  . Alcohol use No  . Drug use: No  . Sexual activity: Not on file   Other Topics Concern  . Not on file   Social History Narrative  . No narrative on file    FAMILY HISTORY: sister breast ca- 50-60s. Sister - breast ca mid- 33s.. Mom- Family History  Problem Relation Age of  Onset  . Cancer Mother     ovarian  . Heart disease Father   . Cancer Sister     breast  . Breast cancer Sister   . Cancer Brother     lung  . Cancer Brother     prostate  . Prostate cancer Brother   . Breast cancer Sister   . Lung cancer Brother   . Lung cancer Brother     ALLERGIES:  has No Known Allergies.  MEDICATIONS:  Current Outpatient Prescriptions  Medication Sig Dispense Refill  . ferrous sulfate 325 (65 FE) MG tablet Take 1 tablet (325 mg total) by mouth 2 (two) times daily with a meal. 60 tablet 0  . Fluticasone Propionate (FLONASE NA) Place 1 spray into the nose daily.     . furosemide (LASIX) 20 MG tablet Take 20 mg by mouth daily.    Marland Kitchen guaiFENesin (MUCINEX) 600 MG 12 hr tablet Take 1,200 mg by mouth 2 (two) times daily as needed for cough or to loosen phlegm.     . insulin NPH Human (HUMULIN N,NOVOLIN N) 100 UNIT/ML injection Inject 30 Units into the skin. Based on sliding scale    . insulin regular (NOVOLIN R,HUMULIN R) 100 units/mL injection Inject 0.2 mLs (20 Units total) into the skin 2 (two) times daily before a meal. 10 mL 11  . losartan (COZAAR) 25 MG tablet Take 25 mg by mouth daily.     . metFORMIN (GLUCOPHAGE) 1000 MG tablet Take 1,000 mg by mouth 2 (two) times daily with a meal.     . montelukast (SINGULAIR) 10 MG tablet Take 1 tablet by mouth daily.    Marland Kitchen omeprazole (PRILOSEC) 20 MG capsule Take 20 mg by mouth daily as needed (heartburn).     . potassium chloride (K-DUR) 10 MEQ tablet Take 1 tablet by mouth 2 (two) times daily.    . tamsulosin (FLOMAX) 0.4 MG CAPS capsule Take 1 capsule (0.4 mg total) by mouth daily after supper. 30 capsule 0  . VENTOLIN HFA 108 (90 Base) MCG/ACT inhaler Inhale 1 puff into the lungs every 6 (six) hours as needed for shortness of breath.     No current facility-administered medications for this visit.       Marland Kitchen  PHYSICAL EXAMINATION: ECOG PERFORMANCE STATUS: 1 - Symptomatic but completely ambulatory  Vitals:    10/20/15 1419  BP: (!) 146/86  Pulse: 78  Resp: 20  Temp: 97.8 F (36.6 C)   Filed Weights   10/20/15 1415  Weight: 228 lb (103.4 kg)    GENERAL: Well-nourished well-developed; Alert, no distress and comfortable.   With his wife. He is in a wheelchair because of generalized weakness. EYES: no pallor or icterus OROPHARYNX: no thrush or ulceration; good dentition  NECK: supple, no masses felt LYMPH:  no palpable lymphadenopathy in the cervical, axillary or inguinal regions LUNGS: Bilateral decreased breath sounds right more than left auscultation and  No wheeze or  crackles HEART/CVS: regular rate & rhythm and no murmurs;Bil 2+ lower extremity edema ABDOMEN: abdomen soft, non-tender and normal bowel sounds Musculoskeletal:no cyanosis of digits and no clubbing  PSYCH: alert & oriented x 3 with fluent speech NEURO: no focal motor/sensory deficits SKIN:  no rashes or significant lesions  LABORATORY DATA:  I have reviewed the data as listed Lab Results  Component Value Date   WBC 4.9 10/20/2015   HGB 11.8 (L) 10/20/2015   HCT 36.7 (L) 10/20/2015   MCV 76.1 (L) 10/20/2015   PLT 143 (L) 10/20/2015    Recent Labs  03/17/15 1946  03/19/15 0525 03/20/15 0551 10/20/15 1610  NA 139  < > 139 139 138  K 4.4  < > 4.0 4.1 4.3  CL 106  < > 104 104 103  CO2 22  < > '26 26 25  '$ GLUCOSE 211*  < > 160* 162* 154*  BUN 26*  < > '19 18 16  '$ CREATININE 0.97  < > 0.75 0.84 0.89  CALCIUM 9.2  < > 8.8* 9.1 9.7  GFRNONAA >60  < > >60 >60 >60  GFRAA >60  < > >60 >60 >60  PROT 6.7  --   --  6.7 7.0  ALBUMIN 4.0  --   --  4.1 3.8  AST 22  --   --  23 103*  ALT 19  --   --  19 99*  ALKPHOS 55  --   --  59 197*  BILITOT 0.4  --   --  0.9 0.6  BILIDIR <0.1*  --   --   --   --   IBILI NOT CALCULATED  --   --   --   --   < > = values in this interval not displayed.  RADIOGRAPHIC STUDIES: I have personally reviewed the radiological images as listed and agreed with the findings in the report. Ct  Abdomen Pelvis W Wo Contrast  Result Date: 10/20/2015 CLINICAL DATA:  75 year old male former smoker with a history of prostate cancer found have widespread metastatic disease in the chest and liver on recent unenhanced chest CT study performed for productive cough. EXAM: CT ABDOMEN AND PELVIS WITHOUT AND WITH CONTRAST TECHNIQUE: Multidetector CT imaging of the abdomen and pelvis was performed following the standard protocol before and following the bolus administration of intravenous contrast. CONTRAST:  125 cc Isovue 370 IV. COMPARISON:  10/12/2015 unenhanced chest CT. FINDINGS: Partially motion degraded study. Lower chest: Re- demonstrated is abrupt cut off of the right lower lobe bronchus with suspected right lower lobe lung mass measuring approximately 7.2 x 4.0 cm (series 2/image 13), poorly delineated given complete right lower lobe atelectasis. Trace right pleural effusion. Numerous (greater than 15) pulmonary nodules of various sizes are scattered throughout both lung bases, measuring up to 1.3 cm in the basilar right upper lobe (series 8/ image 1) and 1.5 cm in the lingula (series 8/ image 18), not appreciably changed since 10/12/2015. Three-vessel coronary atherosclerosis. Trace pericardial effusion/ thickening. Re- demonstration of enlarged 2.5 cm subcarinal lymph node with amorphous calcifications (series 2/image 4), not appreciably changed. Hepatobiliary: The liver is enlarged by numerous (greater than 20) similar-appearing heterogeneous hypo enhancing masses scattered throughout the liver, with representative liver masses as follows: - segment 6 right liver lobe 5.7 x 4.2 cm mass (series 5/image 75) - segments 8/4a liver 8.0 x 7.8 cm mass (series 5/image 39) - segment 2 left liver lobe 3.3 x 3.0 cm mass (series 5/image 41)  Normal gallbladder with no radiopaque cholelithiasis. No biliary ductal dilatation. Pancreas: Diffuse fatty infiltration of the otherwise normal pancreas, with no pancreatic mass  or duct dilation. Spleen: Normal size. No mass. Adrenals/Urinary Tract: No discrete adrenal nodule. No hydronephrosis. No renal stones. Hypodense 1.2 cm renal cortical lesion in the anterior interpolar right kidney (series 7/ image 47) without convincing enhancement. No additional renal lesions. Diffuse bladder wall thickening. No bladder stones. No focal bladder mass. Stomach/Bowel: Grossly normal stomach. Normal caliber small bowel with no small bowel wall thickening. Normal appendix. Moderate sigmoid diverticulosis. Moderate colorectal stool volume. No large bowel wall thickening or pericolonic fat stranding. Vascular/Lymphatic: Atherosclerotic nonaneurysmal abdominal aorta. Patent portal, splenic, hepatic and renal veins. Mildly enlarged left para-aortic 1.0 cm node (series 5/ image 113). No additional pathologically enlarged abdominopelvic lymph nodes. Reproductive: Mildly enlarged prostate with numerous brachytherapy seeds. Other: No pneumoperitoneum, ascites or focal fluid collection. There is fat stranding throughout the left inguinal canal from prior hernia repair, with no definite recurrent hernia. Symmetric moderate gynecomastia. Musculoskeletal: There is a 3.1 cm lytic bone lesion in the right iliac wing posteriorly (series 5/ image 210). No additional aggressive appearing focal osseous lesion. Marked degenerative changes in the thoracolumbar spine. IMPRESSION: 1. Extensive liver metastases as described. 2. Lytic osseous metastasis in the right posterior iliac bone. 3. Mild left para-aortic lymphadenopathy, possibly metastatic . 4. Re- demonstration of right lower lobe lung mass with complete right lower lobe atelectasis, suspicious for primary bronchogenic carcinoma. 5. Re- demonstration of bibasilar pulmonary metastases and subcarinal adenopathy suspicious for nodal metastasis. 6. Additional findings include aortic atherosclerosis, three-vessel coronary atherosclerosis, trace pericardial effusion,  moderate sigmoid diverticulosis, mildly enlarged prostate and moderate symmetric gynecomastia. Electronically Signed   By: Ilona Sorrel M.D.   On: 10/20/2015 13:34   Ct Chest Wo Contrast  Result Date: 10/13/2015 CLINICAL DATA:  75 year old male with history productive cough for the past 4-5 weeks. Former smoker (quit 6-7 years ago). EXAM: CT CHEST WITHOUT CONTRAST TECHNIQUE: Multidetector CT imaging of the chest was performed following the standard protocol without IV contrast. COMPARISON:  No priors. FINDINGS: Cardiovascular: Heart size is mildly enlarged. There is no significant pericardial fluid, thickening or pericardial calcification. There is aortic atherosclerosis, as well as atherosclerosis of the great vessels of the mediastinum and the coronary arteries, including calcified atherosclerotic plaque in the left main, left anterior descending, left circumflex and right coronary arteries. Mediastinum/Nodes: Bulky right hilar and mediastinal lymphadenopathy measuring up to 2.3 cm in the right paratracheal nodal station and 2.2 cm in the subcarinal nodal station. Many of these lymph nodes are slightly high density, suggesting some internal calcifications. Right hilar lymphadenopathy is associated with some mild mass effect upon the adjacent bronchi, particularly with respect to the right lower lobe bronchi. Esophagus is unremarkable in appearance. No axillary lymphadenopathy. Lungs/Pleura: There is abrupt cut off of the right lower lobe bronchus (image 82 of series 3) which appears to terminate in a right lower lobe mass which is poorly defined on today's noncontrast CT examination, but estimated to measure approximately 6.5 x 4.5 cm. This is associated with near complete atelectasis of the right lower lobe. Throughout the remaining aerated portions of the lungs there are numerous small pulmonary nodules which are presumably metastatic, largest of which measures up to 1.6 cm in the inferior segment of the  lingula. Trace right pleural effusion. Upper Abdomen: Innumerable ill-defined low-attenuation nodular and mass-like areas in the liver, largest of which include a lesion centered in segment 6  measuring 5.7 x 4.4 cm (image 148 of series 2), and a lesion centered in segments 4A and 8 (image 107 of series 2) measuring 5.9 x 4.9 cm. Aortic atherosclerosis. Musculoskeletal: There are no aggressive appearing lytic or blastic lesions noted in the visualized portions of the skeleton. IMPRESSION: 1. Widespread metastatic disease in the lungs, mediastinal and right hilar lymph nodes, and liver, as detailed above. There appears to be a mass in the right lower lobe. Whether or not this represents a metastatic lesion or is the primary neoplasm is uncertain. Correlation with bronchoscopy for tissue sampling of the enlarged lymph nodes or the right lower lobe mass should be considered. 2. Aortic atherosclerosis, in addition to left main and 3 vessel coronary artery disease. Assessment for potential risk factor modification, dietary therapy or pharmacologic therapy may be warranted, if clinically indicated. These results will be called to the ordering clinician or representative by the Radiologist Assistant, and communication documented in the PACS or zVision Dashboard. Electronically Signed   By: Vinnie Langton M.D.   On: 10/13/2015 08:37   US Venous Img Lower Bilateral  Result Date: 10/20/2015 CLINICAL DATA:  Bilateral lower extremity edema. History of prostate cancer. EXAM: BILATERAL LOWER EXTREMITY VENOUS DOPPLER ULTRASOUND TECHNIQUE: Gray-scale sonography with graded compression, as well as color Doppler and duplex ultrasound were performed to evaluate the lower extremity deep venous systems from the level of the common femoral vein and including the common femoral, femoral, profunda femoral, popliteal and calf veins including the posterior tibial, peroneal and gastrocnemius veins when visible. The superficial great  saphenous vein was also interrogated. Spectral Doppler was utilized to evaluate flow at rest and with distal augmentation maneuvers in the common femoral, femoral and popliteal veins. COMPARISON:  None. FINDINGS: RIGHT LOWER EXTREMITY Common Femoral Vein: No evidence of thrombus. Normal compressibility, respiratory phasicity and response to augmentation. Saphenofemoral Junction: No evidence of thrombus. Normal compressibility and flow on color Doppler imaging. Profunda Femoral Vein: No evidence of thrombus. Normal compressibility and flow on color Doppler imaging. Femoral Vein: No evidence of thrombus. Normal compressibility, respiratory phasicity and response to augmentation. Popliteal Vein: No evidence of thrombus. Normal compressibility, respiratory phasicity and response to augmentation. Calf Veins: Limited assessment of the peripheral calf veins secondary to edema. Superficial Great Saphenous Vein: No evidence of thrombus. Normal compressibility and flow on color Doppler imaging. Venous Reflux:  None. Other Findings:  None. LEFT LOWER EXTREMITY Common Femoral Vein: No evidence of thrombus. Normal compressibility, respiratory phasicity and response to augmentation. Saphenofemoral Junction: No evidence of thrombus. Normal compressibility and flow on color Doppler imaging. Profunda Femoral Vein: No evidence of thrombus. Normal compressibility and flow on color Doppler imaging. Femoral Vein: No evidence of thrombus. Normal compressibility, respiratory phasicity and response to augmentation. Popliteal Vein: No evidence of thrombus. Normal compressibility, respiratory phasicity and response to augmentation. Calf Veins: Limited assessment of the peripheral calf veins secondary to edema. Superficial Great Saphenous Vein: No evidence of thrombus. Normal compressibility and flow on color Doppler imaging. Venous Reflux:  None. Other Findings:  None. IMPRESSION: No significant femoral popliteal DVT in either extremity.  Limited assessment of the calf veins. Electronically Signed   By: Jerilynn Mages.  Shick M.D.   On: 10/20/2015 18:09    ASSESSMENT & PLAN:   Metastases to the liver [facility]) # Multiple liver metastases/lung nodules- highly suspicious for malignancy. Reviewed the imaging myself and with interventional radiology- liver liver lesions most accessible to biopsy. Biopsies has been ordered.  # Recommend checking CBC CMP;  tumor markers including CEA CA-19-9 LDH  # History of iron deficiency anemia- unclear source; check CBC and iron studies ferritin; possible need for IV iron is significantly low. Was also discussed.  # Bilateral lower extremity swelling- Bil LE venous Dopplers negative for DVT.  # Patient follow-up in approximately few days after the biopsy; patient's contact/preference [phone]/mobile [wife/ Jane].  Thank you Dr. Doy Hutching for allowing me to participate in the care of your pleasant patient. Please do not hesitate to contact me with questions or concerns in the interim.  # 60 minutes face-to-face with the patient discussing the above plan of care; more than 50% of time spent on prognosis/ natural history; counseling and coordination.    All questions were answered. The patient knows to call the clinic with any problems, questions or concerns.       Cammie Sickle, MD 10/20/2015 7:40 PM

## 2015-10-21 NOTE — Telephone Encounter (Signed)
Contacted. Discussed with patient's wife that u/s did not demonstrate any signs of blood clot. I explained that due to the edema in the legs-  There was limited ability to adequately assess the calf veins.  Wife inquiring if IV iron and possible transfusion is needed and what lab results.    I spoke with Dr. Rogue Bussing. Labs hgb stable at 11.8 and iron panels stable. No IV iron needed/no blood transfusion needed.  Tumor markers are still pending.   She inquired about the biopsy date. I explained that this is still pending.   Wife would like to consider having her husband admitted to [facility] to be placed in nursing home. I explained that unless there is a medical emergency, the insurance probably would not cover the admission just to get pt to facility placement.  She would need to involve the pcp for orders to manage routine meds orders and orders for facility placement.  I did explain that she may want to explore her options and call different facilities options as this is her right to do so.  I explain that certain facilities may or may not have any openings at this time for placement. The patient's insurance may also only approve for certain facilities.  She states that she wanted to go by a few facilities today anyway to see and view her options. She will personally contact Dr. Doy Hutching to see how pcp could help with placement at facility.

## 2015-10-22 LAB — CANCER ANTIGEN 19-9: CA 19 9: 941 U/mL — AB (ref 0–35)

## 2015-10-22 LAB — CEA: CEA: 1175 ng/mL — AB (ref 0.0–4.7)

## 2015-10-23 ENCOUNTER — Other Ambulatory Visit: Payer: Self-pay | Admitting: Radiology

## 2015-10-23 ENCOUNTER — Telehealth: Payer: Self-pay | Admitting: *Deleted

## 2015-10-23 NOTE — Telephone Encounter (Signed)
Contacted patient's wife. Liver bx is now scheduled on Monday 9/25. He needs to arrive at 1030 for a 1130 apt.

## 2015-10-26 ENCOUNTER — Ambulatory Visit
Admission: RE | Admit: 2015-10-26 | Discharge: 2015-10-26 | Disposition: A | Discharge status: Home / Self Care | Payer: Medicare Other | Source: Ambulatory Visit | Attending: Internal Medicine | Admitting: Internal Medicine

## 2015-10-26 DIAGNOSIS — E119 Type 2 diabetes mellitus without complications: Secondary | ICD-10-CM | POA: Insufficient documentation

## 2015-10-26 DIAGNOSIS — K219 Gastro-esophageal reflux disease without esophagitis: Secondary | ICD-10-CM | POA: Diagnosis not present

## 2015-10-26 DIAGNOSIS — Z87891 Personal history of nicotine dependence: Secondary | ICD-10-CM | POA: Insufficient documentation

## 2015-10-26 DIAGNOSIS — I10 Essential (primary) hypertension: Secondary | ICD-10-CM | POA: Diagnosis not present

## 2015-10-26 DIAGNOSIS — C787 Secondary malignant neoplasm of liver and intrahepatic bile duct: Secondary | ICD-10-CM | POA: Diagnosis not present

## 2015-10-26 DIAGNOSIS — C61 Malignant neoplasm of prostate: Secondary | ICD-10-CM | POA: Insufficient documentation

## 2015-10-26 DIAGNOSIS — Z79899 Other long term (current) drug therapy: Secondary | ICD-10-CM | POA: Insufficient documentation

## 2015-10-26 DIAGNOSIS — R918 Other nonspecific abnormal finding of lung field: Secondary | ICD-10-CM

## 2015-10-26 DIAGNOSIS — Z7984 Long term (current) use of oral hypoglycemic drugs: Secondary | ICD-10-CM | POA: Insufficient documentation

## 2015-10-26 DIAGNOSIS — E785 Hyperlipidemia, unspecified: Secondary | ICD-10-CM | POA: Diagnosis not present

## 2015-10-26 LAB — CBC
HEMATOCRIT: 37.3 % — AB (ref 40.0–52.0)
Hemoglobin: 12.4 g/dL — ABNORMAL LOW (ref 13.0–18.0)
MCH: 25.3 pg — ABNORMAL LOW (ref 26.0–34.0)
MCHC: 33.3 g/dL (ref 32.0–36.0)
MCV: 75.9 fL — ABNORMAL LOW (ref 80.0–100.0)
PLATELETS: 166 10*3/uL (ref 150–440)
RBC: 4.92 MIL/uL (ref 4.40–5.90)
RDW: 17.5 % — AB (ref 11.5–14.5)
WBC: 5.3 10*3/uL (ref 3.8–10.6)

## 2015-10-26 LAB — PROTIME-INR
INR: 1.02
Prothrombin Time: 13.4 seconds (ref 11.4–15.2)

## 2015-10-26 LAB — GLUCOSE, CAPILLARY: GLUCOSE-CAPILLARY: 97 mg/dL (ref 65–99)

## 2015-10-26 LAB — APTT: APTT: 38 s — AB (ref 24–36)

## 2015-10-26 MED ORDER — MIDAZOLAM HCL 5 MG/5ML IJ SOLN
INTRAMUSCULAR | Status: AC
  Filled 2015-10-26: qty 5

## 2015-10-26 MED ORDER — FENTANYL CITRATE (PF) 100 MCG/2ML IJ SOLN
INTRAMUSCULAR | Status: AC | PRN
  Administered 2015-10-26: 50 ug via INTRAVENOUS

## 2015-10-26 MED ORDER — MIDAZOLAM HCL 5 MG/5ML IJ SOLN
INTRAMUSCULAR | Status: AC | PRN
  Administered 2015-10-26: 1 mg via INTRAVENOUS

## 2015-10-26 MED ORDER — FENTANYL CITRATE (PF) 100 MCG/2ML IJ SOLN
INTRAMUSCULAR | Status: AC
  Filled 2015-10-26: qty 4

## 2015-10-26 MED ORDER — SODIUM CHLORIDE 0.9 % IV SOLN
INTRAVENOUS | Status: DC
  Administered 2015-10-26: 12:00:00 via INTRAVENOUS

## 2015-10-26 NOTE — Progress Notes (Signed)
FSBS 97

## 2015-10-26 NOTE — Procedures (Signed)
Interventional Radiology Procedure Note  Procedure: US guided liver biopsy.  Complications: None Recommendations:  - 1 hour observation - Ok to shower tomorrow - Do not submerge for 7 days - Routine care  Signed,  Dulcy Fanny. Earleen Newport, DO

## 2015-10-26 NOTE — Consult Note (Signed)
Chief Complaint: Multiple liver lesions  Referring Physician(s): Brahmanday,Govinda R  History of Present Illness: [name] is a 75 y.o. male presenting for targeted liver biopsy.    He has multiple liver lesions, compatible with metastases.    Past Medical History:  Diagnosis Date  . Arthritis   . GERD (gastroesophageal reflux disease)   . Hyperlipidemia   . Hypertension   . Prostate cancer ([place]) 03/2013   had seed implant and radiation for 5 weeks  . Type 2 diabetes mellitus ([place])   . Wears glasses     Past Surgical History:  Procedure Laterality Date  . COLONOSCOPY  2010  . CYSTOSCOPY N/A 07/04/2013   Procedure: CYSTOSCOPY FLEXIBLE;  Surgeon: Claybon Jabs, MD;  Location: [facility];  Service: Urology;  Laterality: N/A;  . INGUINAL HERNIA REPAIR Left 2005  . PROSTATE BIOPSY    . RADIOACTIVE SEED IMPLANT N/A 07/04/2013   Procedure: RADIOACTIVE SEED IMPLANT;  Surgeon: Claybon Jabs, MD;  Location: [facility];  Service: Urology;  Laterality: N/A;  . REPAIR CHRONIC INCARCERATED RECURRENT LEFT INGUINAL HERNIA  10-23-2008  . 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  Medications: Prior to Admission medications   Medication Sig Start Date End Date Taking? Authorizing Provider  ferrous sulfate 325 (65 FE) MG tablet Take 1 tablet (325 mg total) by mouth 2 (two) times daily with a meal. 03/20/15  Yes Lavina Hamman, MD  Fluticasone Propionate (FLONASE NA) Place 1 spray into the nose daily.    Yes Historical Provider, MD  furosemide (LASIX) 20 MG tablet Take 20 mg by mouth daily.   Yes Historical Provider, MD  guaiFENesin (MUCINEX) 600 MG 12 hr tablet Take 1,200 mg by mouth 2 (two) times daily as needed for  cough or to loosen phlegm.    Yes Historical Provider, MD  insulin NPH Human (HUMULIN N,NOVOLIN N) 100 UNIT/ML injection Inject 30 Units into the skin. Based on sliding scale   Yes Historical Provider, MD  insulin regular (NOVOLIN R,HUMULIN R) 100 units/mL injection Inject 0.2 mLs (20 Units total) into the skin 2 (two) times daily before a meal. 01/04/15  Yes Florencia Reasons, MD  losartan (COZAAR) 25 MG tablet Take 25 mg by mouth daily.  03/17/15  Yes Historical Provider, MD  metFORMIN (GLUCOPHAGE) 1000 MG tablet Take 1,000 mg by mouth 2 (two) times daily with a meal.  02/28/13  Yes Historical Provider, MD  montelukast (SINGULAIR) 10 MG tablet Take 1 tablet by mouth daily. 10/19/15  Yes Historical Provider, MD  omeprazole (PRILOSEC) 20 MG capsule Take 20 mg by mouth daily as needed (heartburn).    Yes Historical Provider, MD  potassium chloride (K-DUR) 10 MEQ tablet Take 1 tablet by mouth 2 (two) times daily. 10/18/15  Yes Historical Provider, MD  tamsulosin (FLOMAX) 0.4 MG CAPS capsule Take 1 capsule (0.4 mg total) by mouth daily after supper. 03/20/15  Yes Lavina Hamman, MD  VENTOLIN HFA 108 (90 Base) MCG/ACT inhaler Inhale 1 puff into the lungs every 6 (six) hours as needed for shortness of breath. 09/11/15  Yes Historical Provider, MD     Family History  Problem Relation Age of Onset  . Cancer Mother     ovarian  . Heart disease  Father   . Cancer Sister     breast  . Breast cancer Sister   . Cancer Brother     lung  . Cancer Brother     prostate  . Prostate cancer Brother   . Breast cancer Sister   . Lung cancer Brother   . Lung cancer Brother     Social History   Social History  . Marital status: Married    Spouse name: N/A  . Number of children: N/A  . Years of education: N/A   Social History Main Topics  . Smoking status: Former Smoker    Packs/day: 1.00    Years: 30.00    Types: Cigarettes    Quit date: 02/01/2008  . Smokeless tobacco: Never Used  . Alcohol use No  . Drug use:  No  . Sexual activity: Not Asked   Other Topics Concern  . None   Social History Narrative  . None       Review of Systems: A 12 point ROS discussed and pertinent positives are indicated in the HPI above.  All other systems are negative.  Review of Systems  Vital Signs: BP (!) 151/71   Pulse 87   Temp 98.6 F (37 C) (Oral)   Resp 19   SpO2 94%   Physical Exam   Atraumatic, normocephalic.  CTA bilateral.  No wheezes, rhonchi, rales. RRR Abd soft NT/ND.  GU deferred.  No distress.   Mallampati Score:  2  Imaging: Ct Abdomen Pelvis W Wo Contrast  Result Date: 10/20/2015 CLINICAL DATA:  75 year old male former smoker with a history of prostate cancer found have widespread metastatic disease in the chest and liver on recent unenhanced chest CT study performed for productive cough. EXAM: CT ABDOMEN AND PELVIS WITHOUT AND WITH CONTRAST TECHNIQUE: Multidetector CT imaging of the abdomen and pelvis was performed following the standard protocol before and following the bolus administration of intravenous contrast. CONTRAST:  125 cc Isovue 370 IV. COMPARISON:  10/12/2015 unenhanced chest CT. FINDINGS: Partially motion degraded study. Lower chest: Re- demonstrated is abrupt cut off of the right lower lobe bronchus with suspected right lower lobe lung mass measuring approximately 7.2 x 4.0 cm (series 2/image 13), poorly delineated given complete right lower lobe atelectasis. Trace right pleural effusion. Numerous (greater than 15) pulmonary nodules of various sizes are scattered throughout both lung bases, measuring up to 1.3 cm in the basilar right upper lobe (series 8/ image 1) and 1.5 cm in the lingula (series 8/ image 18), not appreciably changed since 10/12/2015. Three-vessel coronary atherosclerosis. Trace pericardial effusion/ thickening. Re- demonstration of enlarged 2.5 cm subcarinal lymph node with amorphous calcifications (series 2/image 4), not appreciably changed.  Hepatobiliary: The liver is enlarged by numerous (greater than 20) similar-appearing heterogeneous hypo enhancing masses scattered throughout the liver, with representative liver masses as follows: - segment 6 right liver lobe 5.7 x 4.2 cm mass (series 5/image 75) - segments 8/4a liver 8.0 x 7.8 cm mass (series 5/image 39) - segment 2 left liver lobe 3.3 x 3.0 cm mass (series 5/image 41) Normal gallbladder with no radiopaque cholelithiasis. No biliary ductal dilatation. Pancreas: Diffuse fatty infiltration of the otherwise normal pancreas, with no pancreatic mass or duct dilation. Spleen: Normal size. No mass. Adrenals/Urinary Tract: No discrete adrenal nodule. No hydronephrosis. No renal stones. Hypodense 1.2 cm renal cortical lesion in the anterior interpolar right kidney (series 7/ image 47) without convincing enhancement. No additional renal lesions. Diffuse bladder wall thickening. No bladder stones.  No focal bladder mass. Stomach/Bowel: Grossly normal stomach. Normal caliber small bowel with no small bowel wall thickening. Normal appendix. Moderate sigmoid diverticulosis. Moderate colorectal stool volume. No large bowel wall thickening or pericolonic fat stranding. Vascular/Lymphatic: Atherosclerotic nonaneurysmal abdominal aorta. Patent portal, splenic, hepatic and renal veins. Mildly enlarged left para-aortic 1.0 cm node (series 5/ image 113). No additional pathologically enlarged abdominopelvic lymph nodes. Reproductive: Mildly enlarged prostate with numerous brachytherapy seeds. Other: No pneumoperitoneum, ascites or focal fluid collection. There is fat stranding throughout the left inguinal canal from prior hernia repair, with no definite recurrent hernia. Symmetric moderate gynecomastia. Musculoskeletal: There is a 3.1 cm lytic bone lesion in the right iliac wing posteriorly (series 5/ image 210). No additional aggressive appearing focal osseous lesion. Marked degenerative changes in the thoracolumbar  spine. IMPRESSION: 1. Extensive liver metastases as described. 2. Lytic osseous metastasis in the right posterior iliac bone. 3. Mild left para-aortic lymphadenopathy, possibly metastatic . 4. Re- demonstration of right lower lobe lung mass with complete right lower lobe atelectasis, suspicious for primary bronchogenic carcinoma. 5. Re- demonstration of bibasilar pulmonary metastases and subcarinal adenopathy suspicious for nodal metastasis. 6. Additional findings include aortic atherosclerosis, three-vessel coronary atherosclerosis, trace pericardial effusion, moderate sigmoid diverticulosis, mildly enlarged prostate and moderate symmetric gynecomastia. Electronically Signed   By: Ilona Sorrel M.D.   On: 10/20/2015 13:34   Ct Chest Wo Contrast  Result Date: 10/13/2015 CLINICAL DATA:  75 year old male with history productive cough for the past 4-5 weeks. Former smoker (quit 6-7 years ago). EXAM: CT CHEST WITHOUT CONTRAST TECHNIQUE: Multidetector CT imaging of the chest was performed following the standard protocol without IV contrast. COMPARISON:  No priors. FINDINGS: Cardiovascular: Heart size is mildly enlarged. There is no significant pericardial fluid, thickening or pericardial calcification. There is aortic atherosclerosis, as well as atherosclerosis of the great vessels of the mediastinum and the coronary arteries, including calcified atherosclerotic plaque in the left main, left anterior descending, left circumflex and right coronary arteries. Mediastinum/Nodes: Bulky right hilar and mediastinal lymphadenopathy measuring up to 2.3 cm in the right paratracheal nodal station and 2.2 cm in the subcarinal nodal station. Many of these lymph nodes are slightly high density, suggesting some internal calcifications. Right hilar lymphadenopathy is associated with some mild mass effect upon the adjacent bronchi, particularly with respect to the right lower lobe bronchi. Esophagus is unremarkable in appearance. No  axillary lymphadenopathy. Lungs/Pleura: There is abrupt cut off of the right lower lobe bronchus (image 82 of series 3) which appears to terminate in a right lower lobe mass which is poorly defined on today's noncontrast CT examination, but estimated to measure approximately 6.5 x 4.5 cm. This is associated with near complete atelectasis of the right lower lobe. Throughout the remaining aerated portions of the lungs there are numerous small pulmonary nodules which are presumably metastatic, largest of which measures up to 1.6 cm in the inferior segment of the lingula. Trace right pleural effusion. Upper Abdomen: Innumerable ill-defined low-attenuation nodular and mass-like areas in the liver, largest of which include a lesion centered in segment 6 measuring 5.7 x 4.4 cm (image 148 of series 2), and a lesion centered in segments 4A and 8 (image 107 of series 2) measuring 5.9 x 4.9 cm. Aortic atherosclerosis. Musculoskeletal: There are no aggressive appearing lytic or blastic lesions noted in the visualized portions of the skeleton. IMPRESSION: 1. Widespread metastatic disease in the lungs, mediastinal and right hilar lymph nodes, and liver, as detailed above. There appears to be a  mass in the right lower lobe. Whether or not this represents a metastatic lesion or is the primary neoplasm is uncertain. Correlation with bronchoscopy for tissue sampling of the enlarged lymph nodes or the right lower lobe mass should be considered. 2. Aortic atherosclerosis, in addition to left main and 3 vessel coronary artery disease. Assessment for potential risk factor modification, dietary therapy or pharmacologic therapy may be warranted, if clinically indicated. These results will be called to the ordering clinician or representative by the Radiologist Assistant, and communication documented in the PACS or zVision Dashboard. Electronically Signed   By: Vinnie Langton M.D.   On: 10/13/2015 08:37   US Venous Img Lower  Bilateral  Result Date: 10/20/2015 CLINICAL DATA:  Bilateral lower extremity edema. History of prostate cancer. EXAM: BILATERAL LOWER EXTREMITY VENOUS DOPPLER ULTRASOUND TECHNIQUE: Gray-scale sonography with graded compression, as well as color Doppler and duplex ultrasound were performed to evaluate the lower extremity deep venous systems from the level of the common femoral vein and including the common femoral, femoral, profunda femoral, popliteal and calf veins including the posterior tibial, peroneal and gastrocnemius veins when visible. The superficial great saphenous vein was also interrogated. Spectral Doppler was utilized to evaluate flow at rest and with distal augmentation maneuvers in the common femoral, femoral and popliteal veins. COMPARISON:  None. FINDINGS: RIGHT LOWER EXTREMITY Common Femoral Vein: No evidence of thrombus. Normal compressibility, respiratory phasicity and response to augmentation. Saphenofemoral Junction: No evidence of thrombus. Normal compressibility and flow on color Doppler imaging. Profunda Femoral Vein: No evidence of thrombus. Normal compressibility and flow on color Doppler imaging. Femoral Vein: No evidence of thrombus. Normal compressibility, respiratory phasicity and response to augmentation. Popliteal Vein: No evidence of thrombus. Normal compressibility, respiratory phasicity and response to augmentation. Calf Veins: Limited assessment of the peripheral calf veins secondary to edema. Superficial Great Saphenous Vein: No evidence of thrombus. Normal compressibility and flow on color Doppler imaging. Venous Reflux:  None. Other Findings:  None. LEFT LOWER EXTREMITY Common Femoral Vein: No evidence of thrombus. Normal compressibility, respiratory phasicity and response to augmentation. Saphenofemoral Junction: No evidence of thrombus. Normal compressibility and flow on color Doppler imaging. Profunda Femoral Vein: No evidence of thrombus. Normal compressibility and flow  on color Doppler imaging. Femoral Vein: No evidence of thrombus. Normal compressibility, respiratory phasicity and response to augmentation. Popliteal Vein: No evidence of thrombus. Normal compressibility, respiratory phasicity and response to augmentation. Calf Veins: Limited assessment of the peripheral calf veins secondary to edema. Superficial Great Saphenous Vein: No evidence of thrombus. Normal compressibility and flow on color Doppler imaging. Venous Reflux:  None. Other Findings:  None. IMPRESSION: No significant femoral popliteal DVT in either extremity. Limited assessment of the calf veins. Electronically Signed   By: Jerilynn Mages.  Shick M.D.   On: 10/20/2015 18:09    Labs:  CBC:  Recent Labs  03/19/15 0525 03/19/15 1319 03/20/15 0551 10/20/15 1610 10/26/15 1103  WBC 5.6  --  5.9 4.9 5.3  HGB 6.9* 8.2* 8.5* 11.8* 12.4*  HCT 23.0* 26.8* 27.2* 36.7* 37.3*  PLT 197  --  196 143* 166    COAGS:  Recent Labs  12/28/14 2024 03/17/15 1946 10/26/15 1103  INR 1.08 1.05 1.02  APTT 28 33 38*    BMP:  Recent Labs  03/18/15 0532 03/19/15 0525 03/20/15 0551 10/20/15 1610  NA 141 139 139 138  K 4.1 4.0 4.1 4.3  CL 107 104 104 103  CO2 '25 26 26 25  '$ GLUCOSE 149* 160*  162* 154*  BUN 27* '19 18 16  '$ CALCIUM 8.8* 8.8* 9.1 9.7  CREATININE 0.92 0.75 0.84 0.89  GFRNONAA >60 >60 >60 >60  GFRAA >60 >60 >60 >60    LIVER FUNCTION TESTS:  Recent Labs  12/29/14 0446 03/17/15 1946 03/20/15 0551 10/20/15 1610  BILITOT 0.6 0.4 0.9 0.6  AST '31 22 23 '$ 103*  ALT 33 19 19 99*  ALKPHOS 47 55 59 197*  PROT 5.9* 6.7 6.7 7.0  ALBUMIN 3.7 4.0 4.1 3.8    TUMOR MARKERS:  Recent Labs  10/20/15 1610  CEA 1,175.0*  CA199 941*    Assessment and Plan:  75 yo male with multiple liver lesions.  We will plan on proceeding with the targeted biopsy.    Risks and Benefits discussed with the patient including, but not limited to bleeding, infection, damage to adjacent structures or low yield  requiring additional tests. All of the patient's questions were answered, patient is agreeable to proceed. Consent signed and in chart.   Thank you for this interesting consult.  I greatly enjoyed meeting EMORI KAMAU and look forward to participating in their care.  A copy of this report was sent to the requesting provider on this date.  Electronically Signed: Corrie Mckusick 10/26/2015, 11:25 AM   I spent a total of  15 Minutes   in face to face in clinical consultation, greater than 50% of which was counseling/coordinating care for Liver disease, image-guide biopsy of liver mass.

## 2015-10-28 ENCOUNTER — Ambulatory Visit: Payer: BLUE CROSS/BLUE SHIELD

## 2015-10-29 ENCOUNTER — Telehealth: Payer: Self-pay | Admitting: *Deleted

## 2015-10-29 ENCOUNTER — Encounter: Payer: Self-pay | Admitting: *Deleted

## 2015-10-29 LAB — SURGICAL PATHOLOGY

## 2015-10-29 NOTE — Telephone Encounter (Signed)
Spoke with wife. [name]. She inquired if pathology results are back prior to husband's apt. She will need a copy of the patient's pathology reports for insurance purposes.

## 2015-10-30 ENCOUNTER — Inpatient Hospital Stay (HOSPITAL_BASED_OUTPATIENT_CLINIC_OR_DEPARTMENT_OTHER): Payer: Medicare Other | Admitting: Internal Medicine

## 2015-10-30 ENCOUNTER — Encounter: Payer: Self-pay | Admitting: Internal Medicine

## 2015-10-30 DIAGNOSIS — C787 Secondary malignant neoplasm of liver and intrahepatic bile duct: Secondary | ICD-10-CM

## 2015-10-30 DIAGNOSIS — Z8546 Personal history of malignant neoplasm of prostate: Secondary | ICD-10-CM

## 2015-10-30 DIAGNOSIS — D509 Iron deficiency anemia, unspecified: Secondary | ICD-10-CM | POA: Diagnosis not present

## 2015-10-30 DIAGNOSIS — R531 Weakness: Secondary | ICD-10-CM

## 2015-10-30 DIAGNOSIS — C3431 Malignant neoplasm of lower lobe, right bronchus or lung: Secondary | ICD-10-CM | POA: Diagnosis not present

## 2015-10-30 DIAGNOSIS — Z79899 Other long term (current) drug therapy: Secondary | ICD-10-CM

## 2015-10-30 NOTE — Progress Notes (Signed)
START ON PATHWAY REGIMEN - Small Cell Lung  [id number]: Etoposide 100 mg/m2 Days 1, 2, 3 + Carboplatin AUC=5 Day 1 q21 Days x 4 Cycles   A cycle is every 21 days:     Etoposide (Toposar(R)) 100 mg/m2 in 500 mL NS IV over 2 hours days 1-3 Dose Mod: None     Carboplatin (Paraplatin(R)) AUC=5 in 500 mL NS IV over 1 hour day 1 only Dose Mod: None Additional Orders: * All AUC calculations intended to be used in [employer] formula  **Always confirm dose/schedule in your pharmacy ordering system**    Patient Characteristics: Extensive Stage, First Line Stage Grouping: Extensive AJCC M Stage: X AJCC N Stage: X AJCC T Stage: X Line of therapy: First Line Would you be surprised if this patient died  in the next year? I would be surprised if this patient died in the next year  Intent of Therapy: Non-Curative / Palliative Intent, Discussed with Patient

## 2015-10-30 NOTE — Progress Notes (Signed)
US Guided Biopsy 25th.  No concerns per pt .

## 2015-10-30 NOTE — Assessment & Plan Note (Signed)
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 Reviewed the pathology and staging with the patient's family in detail.  # Discussed palliative chemotherapy with carboplatin-etoposide every 3 weeks. Discussed the potential side effects including but not limited to-increasing fatigue, nausea vomiting, diarrhea, hair loss, sores in the mouth, increase risk of infection and also neuropathy.   # Given his borderline performance status/ discuss patient is at risk for side effects. After a lengthy discussion of interest in treatment; however if he has significant side effects- they would want to discontinue treatment.  # Discussed regarding chemotherapy education port placement.   # Plan start chemotherapy next week. Growth factor-Neulasta/On pro would be given as prophylaxis for chemotherapy-induced neutropenia to prevent febrile neutropenias.   # Follow with me in approximately 10 days post chemotherapy with CBC BMP  # Patient's wife significant concern regarding- social support/placement for the patient/financial issues.- Patient is awaiting evaluation with PCP regarding placement.  I will also try to reach Dr. Doy Hutching.   # 40 minutes face-to-face with the patient discussing the above plan of care; more than 50% of time spent on prognosis/ natural history; counseling and coordination.

## 2015-10-30 NOTE — Progress Notes (Signed)
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  male multiple liver lesions right lower lobe lung mass-is here accompanied by his wife to discuss the results of the liver biopsy.  Patient continues to feel poorly. Continues to shortness of breath with exertion. No nausea. No vomiting. Intermittent swelling in the legs. Positive for cough. No hemoptysis. Generalized weakness. Patient is unable to get up and walk by himself. Wife thinks patient- is too weak to stay at home by himself; needs to go to a nursing facility  ROS: A complete 10 point review of system is done which is negative except mentioned above in history of present illness  MEDICAL HISTORY:  Past Medical History:  Diagnosis Date  . Arthritis   . GERD (gastroesophageal reflux disease)   . Hyperlipidemia   . Hypertension   . 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 Prostate cancer ([place]) 03/2013   had seed implant and radiation for 5 weeks  . Type 2 diabetes mellitus ([place])   . Wears glasses     SURGICAL  HISTORY: Past Surgical History:  Procedure Laterality Date  . COLONOSCOPY  2010  . CYSTOSCOPY N/A 07/04/2013   Procedure: CYSTOSCOPY FLEXIBLE;  Surgeon: Claybon Jabs, MD;  Location: [facility];  Service: Urology;  Laterality: N/A;  . INGUINAL HERNIA REPAIR Left 2005  . PROSTATE BIOPSY    . RADIOACTIVE SEED IMPLANT N/A 07/04/2013   Procedure: RADIOACTIVE SEED IMPLANT;  Surgeon: Claybon Jabs, MD;  Location: [facility];  Service: Urology;  Laterality: N/A;  . REPAIR CHRONIC INCARCERATED RECURRENT LEFT INGUINAL HERNIA  10-23-2008  . TRANSURETHRAL RESECTION OF BLADDER TUMOR N/A 01/02/2015   Procedure: Cystoscopy clot evalucation and fulgration;  Surgeon: Kathie Rhodes, MD;  Location: [facility];  Service: Urology;  Laterality: N/A;    SOCIAL HISTORY: former smoker-- quit 6-7 years; no alcohol; used to [place]/ [place]/ [place];. Lives with wife..  Social History   Social History  . Marital status: Married    Spouse name: N/A  . Number of children: N/A  . Years of education: N/A   Occupational History  . Not on file.   Social History Main Topics  . Smoking status: Former Smoker    Packs/day: 1.00    Years: 30.00    Types: Cigarettes    Quit date: 02/01/2008  . Smokeless tobacco: Never Used  . Alcohol use No  . Drug use: No  . Sexual activity: Not on file   Other Topics Concern  .  Not on file   Social History Narrative  . No narrative on file    FAMILY HISTORY: sister breast ca- 50-60s. Sister - breast ca mid- 40s.. Mom- Family History  Problem Relation Age of Onset  . Cancer Mother     ovarian  . Heart disease Father   . Cancer Sister     breast  . Breast cancer Sister   . Cancer Brother     lung  . Cancer Brother     prostate  . Prostate cancer Brother   . Breast cancer Sister   . Lung cancer Brother   . Lung cancer Brother     ALLERGIES:  has No Known Allergies.  MEDICATIONS:  Current Outpatient Prescriptions  Medication  Sig Dispense Refill  . ferrous sulfate 325 (65 FE) MG tablet Take 1 tablet (325 mg total) by mouth 2 (two) times daily with a meal. 60 tablet 0  . Fluticasone Propionate (FLONASE NA) Place 1 spray into the nose daily.     . furosemide (LASIX) 20 MG tablet Take 20 mg by mouth daily.    Marland Kitchen guaiFENesin (MUCINEX) 600 MG 12 hr tablet Take 1,200 mg by mouth 2 (two) times daily as needed for cough or to loosen phlegm.     . insulin NPH Human (HUMULIN N,NOVOLIN N) 100 UNIT/ML injection Inject 30 Units into the skin. Based on sliding scale    . insulin regular (NOVOLIN R,HUMULIN R) 100 units/mL injection Inject 0.2 mLs (20 Units total) into the skin 2 (two) times daily before a meal. 10 mL 11  . losartan (COZAAR) 25 MG tablet Take 25 mg by mouth daily.     . metFORMIN (GLUCOPHAGE) 1000 MG tablet Take 1,000 mg by mouth 2 (two) times daily with a meal.     . montelukast (SINGULAIR) 10 MG tablet Take 1 tablet by mouth daily.    Marland Kitchen omeprazole (PRILOSEC) 20 MG capsule Take 20 mg by mouth daily as needed (heartburn).     . potassium chloride (K-DUR) 10 MEQ tablet Take 1 tablet by mouth 2 (two) times daily.    . tamsulosin (FLOMAX) 0.4 MG CAPS capsule Take 1 capsule (0.4 mg total) by mouth daily after supper. 30 capsule 0  . VENTOLIN HFA 108 (90 Base) MCG/ACT inhaler Inhale 1 puff into the lungs every 6 (six) hours as needed for shortness of breath.     No current facility-administered medications for this visit.       Marland Kitchen  PHYSICAL EXAMINATION: ECOG PERFORMANCE STATUS: 1 - Symptomatic but completely ambulatory  Vitals:   10/30/15 1616  BP: (!) 145/80  Pulse: 99  Resp: 16  Temp: 97.3 F (36.3 C)   Filed Weights   10/30/15 1616  Weight: 223 lb 12.8 oz (101.5 kg)    GENERAL: Well-nourished well-developed; Alert, no distress and comfortable.   With his wife. He is in a wheelchair because of generalized weakness. EYES: no pallor or icterus OROPHARYNX: no thrush or ulceration; good dentition  NECK:  supple, no masses felt LYMPH:  no palpable lymphadenopathy in the cervical, axillary or inguinal regions LUNGS: Bilateral decreased breath sounds right more than left auscultation and  No wheeze or crackles HEART/CVS: regular rate & rhythm and no murmurs;Bil 2+ lower extremity edema ABDOMEN: abdomen soft, non-tender and normal bowel sounds Musculoskeletal:no cyanosis of digits and no clubbing  PSYCH: alert & oriented x 3 with fluent speech NEURO: no focal motor/sensory deficits SKIN:  no rashes or significant lesions  LABORATORY DATA:  I have reviewed the data as listed Lab Results  Component Value Date   WBC 5.3 10/26/2015   HGB 12.4 (L) 10/26/2015   HCT 37.3 (L) 10/26/2015   MCV 75.9 (L) 10/26/2015   PLT 166 10/26/2015    Recent Labs  03/17/15 1946  03/19/15 0525 03/20/15 0551 10/20/15 1610  NA 139  < > 139 139 138  K 4.4  < > 4.0 4.1 4.3  CL 106  < > 104 104 103  CO2 22  < > '26 26 25  '$ GLUCOSE 211*  < > 160* 162* 154*  BUN 26*  < > '19 18 16  '$ CREATININE 0.97  < > 0.75 0.84 0.89  CALCIUM 9.2  < > 8.8* 9.1 9.7  GFRNONAA >60  < > >60 >60 >60  GFRAA >60  < > >60 >60 >60  PROT 6.7  --   --  6.7 7.0  ALBUMIN 4.0  --   --  4.1 3.8  AST 22  --   --  23 103*  ALT 19  --   --  19 99*  ALKPHOS 55  --   --  59 197*  BILITOT 0.4  --   --  0.9 0.6  BILIDIR <0.1*  --   --   --   --   IBILI NOT CALCULATED  --   --   --   --   < > = values in this interval not displayed.  RADIOGRAPHIC STUDIES: I have personally reviewed the radiological images as listed and agreed with the findings in the report. Ct Abdomen Pelvis W Wo Contrast  Result Date: 10/20/2015 CLINICAL DATA:  75 year old male former smoker with a history of prostate cancer found have widespread metastatic disease in the chest and liver on recent unenhanced chest CT study performed for productive cough. EXAM: CT ABDOMEN AND PELVIS WITHOUT AND WITH CONTRAST TECHNIQUE: Multidetector CT imaging of the abdomen and pelvis  was performed following the standard protocol before and following the bolus administration of intravenous contrast. CONTRAST:  125 cc Isovue 370 IV. COMPARISON:  10/12/2015 unenhanced chest CT. FINDINGS: Partially motion degraded study. Lower chest: Re- demonstrated is abrupt cut off of the right lower lobe bronchus with suspected right lower lobe lung mass measuring approximately 7.2 x 4.0 cm (series 2/image 13), poorly delineated given complete right lower lobe atelectasis. Trace right pleural effusion. Numerous (greater than 15) pulmonary nodules of various sizes are scattered throughout both lung bases, measuring up to 1.3 cm in the basilar right upper lobe (series 8/ image 1) and 1.5 cm in the lingula (series 8/ image 18), not appreciably changed since 10/12/2015. Three-vessel coronary atherosclerosis. Trace pericardial effusion/ thickening. Re- demonstration of enlarged 2.5 cm subcarinal lymph node with amorphous calcifications (series 2/image 4), not appreciably changed. Hepatobiliary: The liver is enlarged by numerous (greater than 20) similar-appearing heterogeneous hypo enhancing masses scattered throughout the liver, with representative liver masses as follows: - segment 6 right liver lobe 5.7 x 4.2 cm mass (series 5/image 75) - segments 8/4a liver 8.0 x 7.8 cm mass (series 5/image 39) - segment 2 left liver lobe 3.3 x 3.0 cm mass (series 5/image 41) Normal gallbladder with no radiopaque cholelithiasis. No biliary ductal dilatation. Pancreas: Diffuse fatty infiltration of the otherwise normal pancreas, with no pancreatic mass or duct dilation. Spleen: Normal size. No mass. Adrenals/Urinary Tract: No discrete adrenal nodule. No hydronephrosis. No renal stones. Hypodense 1.2 cm renal cortical lesion in the anterior interpolar  right kidney (series 7/ image 47) without convincing enhancement. No additional renal lesions. Diffuse bladder wall thickening. No bladder stones. No focal bladder mass.  Stomach/Bowel: Grossly normal stomach. Normal caliber small bowel with no small bowel wall thickening. Normal appendix. Moderate sigmoid diverticulosis. Moderate colorectal stool volume. No large bowel wall thickening or pericolonic fat stranding. Vascular/Lymphatic: Atherosclerotic nonaneurysmal abdominal aorta. Patent portal, splenic, hepatic and renal veins. Mildly enlarged left para-aortic 1.0 cm node (series 5/ image 113). No additional pathologically enlarged abdominopelvic lymph nodes. Reproductive: Mildly enlarged prostate with numerous brachytherapy seeds. Other: No pneumoperitoneum, ascites or focal fluid collection. There is fat stranding throughout the left inguinal canal from prior hernia repair, with no definite recurrent hernia. Symmetric moderate gynecomastia. Musculoskeletal: There is a 3.1 cm lytic bone lesion in the right iliac wing posteriorly (series 5/ image 210). No additional aggressive appearing focal osseous lesion. Marked degenerative changes in the thoracolumbar spine. IMPRESSION: 1. Extensive liver metastases as described. 2. Lytic osseous metastasis in the right posterior iliac bone. 3. Mild left para-aortic lymphadenopathy, possibly metastatic . 4. Re- demonstration of right lower lobe lung mass with complete right lower lobe atelectasis, suspicious for primary bronchogenic carcinoma. 5. Re- demonstration of bibasilar pulmonary metastases and subcarinal adenopathy suspicious for nodal metastasis. 6. Additional findings include aortic atherosclerosis, three-vessel coronary atherosclerosis, trace pericardial effusion, moderate sigmoid diverticulosis, mildly enlarged prostate and moderate symmetric gynecomastia. Electronically Signed   By: Ilona Sorrel M.D.   On: 10/20/2015 13:34   Ct Chest Wo Contrast  Result Date: 10/13/2015 CLINICAL DATA:  75 year old male with history productive cough for the past 4-5 weeks. Former smoker (quit 6-7 years ago). EXAM: CT CHEST WITHOUT CONTRAST  TECHNIQUE: Multidetector CT imaging of the chest was performed following the standard protocol without IV contrast. COMPARISON:  No priors. FINDINGS: Cardiovascular: Heart size is mildly enlarged. There is no significant pericardial fluid, thickening or pericardial calcification. There is aortic atherosclerosis, as well as atherosclerosis of the great vessels of the mediastinum and the coronary arteries, including calcified atherosclerotic plaque in the left main, left anterior descending, left circumflex and right coronary arteries. Mediastinum/Nodes: Bulky right hilar and mediastinal lymphadenopathy measuring up to 2.3 cm in the right paratracheal nodal station and 2.2 cm in the subcarinal nodal station. Many of these lymph nodes are slightly high density, suggesting some internal calcifications. Right hilar lymphadenopathy is associated with some mild mass effect upon the adjacent bronchi, particularly with respect to the right lower lobe bronchi. Esophagus is unremarkable in appearance. No axillary lymphadenopathy. Lungs/Pleura: There is abrupt cut off of the right lower lobe bronchus (image 82 of series 3) which appears to terminate in a right lower lobe mass which is poorly defined on today's noncontrast CT examination, but estimated to measure approximately 6.5 x 4.5 cm. This is associated with near complete atelectasis of the right lower lobe. Throughout the remaining aerated portions of the lungs there are numerous small pulmonary nodules which are presumably metastatic, largest of which measures up to 1.6 cm in the inferior segment of the lingula. Trace right pleural effusion. Upper Abdomen: Innumerable ill-defined low-attenuation nodular and mass-like areas in the liver, largest of which include a lesion centered in segment 6 measuring 5.7 x 4.4 cm (image 148 of series 2), and a lesion centered in segments 4A and 8 (image 107 of series 2) measuring 5.9 x 4.9 cm. Aortic atherosclerosis. Musculoskeletal:  There are no aggressive appearing lytic or blastic lesions noted in the visualized portions of the skeleton. IMPRESSION: 1. Widespread  metastatic disease in the lungs, mediastinal and right hilar lymph nodes, and liver, as detailed above. There appears to be a mass in the right lower lobe. Whether or not this represents a metastatic lesion or is the primary neoplasm is uncertain. Correlation with bronchoscopy for tissue sampling of the enlarged lymph nodes or the right lower lobe mass should be considered. 2. Aortic atherosclerosis, in addition to left main and 3 vessel coronary artery disease. Assessment for potential risk factor modification, dietary therapy or pharmacologic therapy may be warranted, if clinically indicated. These results will be called to the ordering clinician or representative by the Radiologist Assistant, and communication documented in the PACS or zVision Dashboard. Electronically Signed   By: Vinnie Langton M.D.   On: 10/13/2015 08:37   US Biopsy  Result Date: 10/26/2015 INDICATION: 75 year old male with multiple liver lesions. He has been referred for biopsy. EXAM: ULTRASOUND BIOPSY CORE LIVER MEDICATIONS: None. ANESTHESIA/SEDATION: Moderate (conscious) sedation was employed during this procedure. A total of Versed 1.0 mg and Fentanyl 50 mcg was administered intravenously. Moderate Sedation Time: 15 minutes. The patient's level of consciousness and vital signs were monitored continuously by radiology nursing throughout the procedure under my direct supervision. FLUOROSCOPY TIME:  None COMPLICATIONS: None PROCEDURE: Informed written consent was obtained from the patient after a thorough discussion of the procedural risks, benefits and alternatives. All questions were addressed. Maximal Sterile Barrier Technique was utilized including caps, mask, sterile gowns, sterile gloves, sterile drape, hand hygiene and skin antiseptic. A timeout was performed prior to the initiation of the  procedure. Patient positioned supine position on the ultrasound table. Ultrasound survey of the upper abdomen was performed with images stored and sent to PACs. The patient was then prepped and draped in the usual sterile fashion. The skin and subcutaneous tissues were generously infiltrated 1% lidocaine for local anesthesia. Using ultrasound guidance, 17 gauge guide needle was passed into targeted liver lesion of the right liver lobe. Multiple 18 gauge core biopsy were achieved. Gel-Foam pledget was infused with a small amount of saline. Needle was removed and a final image was stored. Patient tolerated the procedure well and remained hemodynamically stable throughout. No complications were encountered and no significant blood loss was encountered. IMPRESSION: Status post ultrasound-guided core biopsy of right liver lobe lesion. Tissue specimen sent to pathology for complete histopathologic analysis. Signed, Dulcy Fanny. Earleen Newport, DO [facility] Electronically Signed   By: Corrie Mckusick D.O.   On: 10/26/2015 12:56   US Venous Img Lower Bilateral  Result Date: 10/20/2015 CLINICAL DATA:  Bilateral lower extremity edema. History of prostate cancer. EXAM: BILATERAL LOWER EXTREMITY VENOUS DOPPLER ULTRASOUND TECHNIQUE: Gray-scale sonography with graded compression, as well as color Doppler and duplex ultrasound were performed to evaluate the lower extremity deep venous systems from the level of the common femoral vein and including the common femoral, femoral, profunda femoral, popliteal and calf veins including the posterior tibial, peroneal and gastrocnemius veins when visible. The superficial great saphenous vein was also interrogated. Spectral Doppler was utilized to evaluate flow at rest and with distal augmentation maneuvers in the common femoral, femoral and popliteal veins. COMPARISON:  None. FINDINGS: RIGHT LOWER EXTREMITY Common Femoral Vein: No evidence  of thrombus. Normal compressibility, respiratory phasicity and response to augmentation. Saphenofemoral Junction: No evidence of thrombus. Normal compressibility and flow on color Doppler imaging. Profunda Femoral Vein: No evidence of thrombus. Normal compressibility and flow on color Doppler imaging. Femoral Vein: No evidence of thrombus. Normal  compressibility, respiratory phasicity and response to augmentation. Popliteal Vein: No evidence of thrombus. Normal compressibility, respiratory phasicity and response to augmentation. Calf Veins: Limited assessment of the peripheral calf veins secondary to edema. Superficial Great Saphenous Vein: No evidence of thrombus. Normal compressibility and flow on color Doppler imaging. Venous Reflux:  None. Other Findings:  None. LEFT LOWER EXTREMITY Common Femoral Vein: No evidence of thrombus. Normal compressibility, respiratory phasicity and response to augmentation. Saphenofemoral Junction: No evidence of thrombus. Normal compressibility and flow on color Doppler imaging. Profunda Femoral Vein: No evidence of thrombus. Normal compressibility and flow on color Doppler imaging. Femoral Vein: No evidence of thrombus. Normal compressibility, respiratory phasicity and response to augmentation. Popliteal Vein: No evidence of thrombus. Normal compressibility, respiratory phasicity and response to augmentation. Calf Veins: Limited assessment of the peripheral calf veins secondary to edema. Superficial Great Saphenous Vein: No evidence of thrombus. Normal compressibility and flow on color Doppler imaging. Venous Reflux:  None. Other Findings:  None. IMPRESSION: No significant femoral popliteal DVT in either extremity. Limited assessment of the calf veins. Electronically Signed   By: Jerilynn Mages.  Shick M.D.   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 Reviewed the  pathology and staging with the patient's family in detail.  # Discussed palliative chemotherapy with carboplatin-etoposide every 3 weeks. Discussed the potential side effects including but not limited to-increasing fatigue, nausea vomiting, diarrhea, hair loss, sores in the mouth, increase risk of infection and also neuropathy.   # Given his borderline performance status/ discuss patient is at risk for side effects. After a lengthy discussion of interest in treatment; however if he has significant side effects- they would want to discontinue treatment.  # Discussed regarding chemotherapy education port placement.   # Plan start chemotherapy next week. Growth factor-Neulasta/On pro would be given as prophylaxis for chemotherapy-induced neutropenia to prevent febrile neutropenias.   # Follow with me in approximately 10 days post chemotherapy with CBC BMP  # Patient's wife significant concern regarding- social support/placement for the patient/financial issues.- Patient is awaiting evaluation with PCP regarding placement.  I will also try to reach Dr. Doy Hutching.   # 40 minutes face-to-face with the patient discussing the above plan of care; more than 50% of time spent on prognosis/ natural history; counseling and coordination.   All questions were answered. The patient knows to call the clinic with any problems, questions or concerns.    Cammie Sickle, MD 10/30/2015 5:27 PM

## 2015-11-02 HISTORY — PX: LIVER BIOPSY: SHX301

## 2015-11-02 NOTE — Patient Instructions (Signed)
Etoposide, VP-16 injection  What is this medicine?  ETOPOSIDE, VP-16 (e toe POE side) is a chemotherapy drug. It is used to treat testicular cancer, lung cancer, and other cancers.  This medicine may be used for other purposes; ask your health care provider or pharmacist if you have questions.  What should I tell my health care provider before I take this medicine?  They need to know if you have any of these conditions:  -infection  -kidney disease  -low blood counts, like low white cell, platelet, or red cell counts  -an unusual or allergic reaction to etoposide, other chemotherapeutic agents, other medicines, foods, dyes, or preservatives  -pregnant or trying to get pregnant  -breast-feeding  How should I use this medicine?  This medicine is for infusion into a vein. It is administered in a hospital or clinic by a specially trained health care professional.  Talk to your pediatrician regarding the use of this medicine in children. Special care may be needed.  Overdosage: If you think you have taken too much of this medicine contact a poison control center or emergency room at once.  NOTE: This medicine is only for you. Do not share this medicine with others.  What if I miss a dose?  It is important not to miss your dose. Call your doctor or health care professional if you are unable to keep an appointment.  What may interact with this medicine?  -aspirin  -certain medications for seizures like carbamazepine, phenobarbital, phenytoin, valproic acid  -cyclosporine  -levamisole  -warfarin  This list may not describe all possible interactions. Give your health care provider a list of all the medicines, herbs, non-prescription drugs, or dietary supplements you use. Also tell them if you smoke, drink alcohol, or use illegal drugs. Some items may interact with your medicine.  What should I watch for while using this medicine?  Visit your doctor for checks on your progress. This drug may make you feel generally unwell.  This is not uncommon, as chemotherapy can affect healthy cells as well as cancer cells. Report any side effects. Continue your course of treatment even though you feel ill unless your doctor tells you to stop.  In some cases, you may be given additional medicines to help with side effects. Follow all directions for their use.  Call your doctor or health care professional for advice if you get a fever, chills or sore throat, or other symptoms of a cold or flu. Do not treat yourself. This drug decreases your body's ability to fight infections. Try to avoid being around people who are sick.  This medicine may increase your risk to bruise or bleed. Call your doctor or health care professional if you notice any unusual bleeding.  Be careful brushing and flossing your teeth or using a toothpick because you may get an infection or bleed more easily. If you have any dental work done, tell your dentist you are receiving this medicine.  Avoid taking products that contain aspirin, acetaminophen, ibuprofen, naproxen, or ketoprofen unless instructed by your doctor. These medicines may hide a fever.  Do not become pregnant while taking this medicine or for at least 6 months after stopping it. Women should inform their doctor if they wish to become pregnant or think they might be pregnant. Women of child-bearing potential will need to have a negative pregnancy test before starting this medicine. There is a potential for serious side effects to an unborn child. Talk to your health care professional or   pharmacist for more information. Do not breast-feed an infant while taking this medicine.  Men must use a latex condom during sexual contact with a woman while taking this medicine and for at least 4 months after stopping it. A latex condom is needed even if you have had a vasectomy. Contact your doctor right away if your partner becomes pregnant. Do not donate sperm while taking this medicine and for at least 4 months after you stop  taking this medicine. Men should inform their doctors if they wish to father a child. This medicine may lower sperm counts.  What side effects may I notice from receiving this medicine?  Side effects that you should report to your doctor or health care professional as soon as possible:  -allergic reactions like skin rash, itching or hives, swelling of the face, lips, or tongue  -low blood counts - this medicine may decrease the number of white blood cells, red blood cells and platelets. You may be at increased risk for infections and bleeding.  -signs of infection - fever or chills, cough, sore throat, pain or difficulty passing urine  -signs of decreased platelets or bleeding - bruising, pinpoint red spots on the skin, black, tarry stools, blood in the urine  -signs of decreased red blood cells - unusually weak or tired, fainting spells, lightheadedness  -breathing problems  -changes in vision  -mouth or throat sores or ulcers  -pain, redness, swelling or irritation at the injection site  -pain, tingling, numbness in the hands or feet  -redness, blistering, peeling or loosening of the skin, including inside the mouth  -seizures  -vomiting  Side effects that usually do not require medical attention (report to your doctor or health care professional if they continue or are bothersome):  -diarrhea  -hair loss  -loss of appetite  -nausea  -stomach pain  This list may not describe all possible side effects. Call your doctor for medical advice about side effects. You may report side effects to FDA at 1-800-FDA-1088.  Where should I keep my medicine?  This drug is given in a hospital or clinic and will not be stored at home.  NOTE: This sheet is a summary. It may not cover all possible information. If you have questions about this medicine, talk to your doctor, pharmacist, or health care provider.      2016, [employer] Standard. (2013-09-12 12:32:50)  Carboplatin injection  What is this medicine?  CARBOPLATIN (KAR boe  pla tin) is a chemotherapy drug. It targets fast dividing cells, like cancer cells, and causes these cells to die. This medicine is used to treat ovarian cancer and many other cancers.  This medicine may be used for other purposes; ask your health care provider or pharmacist if you have questions.  What should I tell my health care provider before I take this medicine?  They need to know if you have any of these conditions:  -blood disorders  -hearing problems  -kidney disease  -recent or ongoing radiation therapy  -an unusual or allergic reaction to carboplatin, cisplatin, other chemotherapy, other medicines, foods, dyes, or preservatives  -pregnant or trying to get pregnant  -breast-feeding  How should I use this medicine?  This drug is usually given as an infusion into a vein. It is administered in a hospital or clinic by a specially trained health care professional.  Talk to your pediatrician regarding the use of this medicine in children. Special care may be needed.  Overdosage: If you think you have taken   too much of this medicine contact a poison control center or emergency room at once.  NOTE: This medicine is only for you. Do not share this medicine with others.  What if I miss a dose?  It is important not to miss a dose. Call your doctor or health care professional if you are unable to keep an appointment.  What may interact with this medicine?  -medicines for seizures  -medicines to increase blood counts like filgrastim, pegfilgrastim, sargramostim  -some antibiotics like amikacin, gentamicin, neomycin, streptomycin, tobramycin  -vaccines  Talk to your doctor or health care professional before taking any of these medicines:  -acetaminophen  -aspirin  -ibuprofen  -ketoprofen  -naproxen  This list may not describe all possible interactions. Give your health care provider a list of all the medicines, herbs, non-prescription drugs, or dietary supplements you use. Also tell them if you smoke, drink alcohol, or  use illegal drugs. Some items may interact with your medicine.  What should I watch for while using this medicine?  Your condition will be monitored carefully while you are receiving this medicine. You will need important blood work done while you are taking this medicine.  This drug may make you feel generally unwell. This is not uncommon, as chemotherapy can affect healthy cells as well as cancer cells. Report any side effects. Continue your course of treatment even though you feel ill unless your doctor tells you to stop.  In some cases, you may be given additional medicines to help with side effects. Follow all directions for their use.  Call your doctor or health care professional for advice if you get a fever, chills or sore throat, or other symptoms of a cold or flu. Do not treat yourself. This drug decreases your body's ability to fight infections. Try to avoid being around people who are sick.  This medicine may increase your risk to bruise or bleed. Call your doctor or health care professional if you notice any unusual bleeding.  Be careful brushing and flossing your teeth or using a toothpick because you may get an infection or bleed more easily. If you have any dental work done, tell your dentist you are receiving this medicine.  Avoid taking products that contain aspirin, acetaminophen, ibuprofen, naproxen, or ketoprofen unless instructed by your doctor. These medicines may hide a fever.  Do not become pregnant while taking this medicine. Women should inform their doctor if they wish to become pregnant or think they might be pregnant. There is a potential for serious side effects to an unborn child. Talk to your health care professional or pharmacist for more information. Do not breast-feed an infant while taking this medicine.  What side effects may I notice from receiving this medicine?  Side effects that you should report to your doctor or health care professional as soon as possible:  -allergic  reactions like skin rash, itching or hives, swelling of the face, lips, or tongue  -signs of infection - fever or chills, cough, sore throat, pain or difficulty passing urine  -signs of decreased platelets or bleeding - bruising, pinpoint red spots on the skin, black, tarry stools, nosebleeds  -signs of decreased red blood cells - unusually weak or tired, fainting spells, lightheadedness  -breathing problems  -changes in hearing  -changes in vision  -chest pain  -high blood pressure  -low blood counts - This drug may decrease the number of white blood cells, red blood cells and platelets. You may be at increased risk for   hospital or clinic and will not be stored at home. NOTE: This sheet is a summary. It may not cover all possible information. If you have questions about this medicine, talk to your doctor, pharmacist, or health care provider.    2016, [employer] Standard. (2007-04-24 14:38:05) Pegfilgrastim injection What is this medicine? PEGFILGRASTIM (PEG fil gra stim) is a long-acting granulocyte colony-stimulating factor that stimulates the growth of neutrophils, a type of white blood cell important in the body's fight against infection. It is used to reduce the incidence of fever and infection in patients  with certain types of cancer who are receiving chemotherapy that affects the bone marrow, and to increase survival after being exposed to high doses of radiation. This medicine may be used for other purposes; ask your health care provider or pharmacist if you have questions. What should I tell my health care provider before I take this medicine? They need to know if you have any of these conditions: -kidney disease -latex allergy -ongoing radiation therapy -sickle cell disease -skin reactions to acrylic adhesives (On-Body Injector only) -an unusual or allergic reaction to pegfilgrastim, filgrastim, other medicines, foods, dyes, or preservatives -pregnant or trying to get pregnant -breast-feeding How should I use this medicine? This medicine is for injection under the skin. If you get this medicine at home, you will be taught how to prepare and give the pre-filled syringe or how to use the On-body Injector. Refer to the patient Instructions for Use for detailed instructions. Use exactly as directed. Take your medicine at regular intervals. Do not take your medicine more often than directed. It is important that you put your used needles and syringes in a special sharps container. Do not put them in a trash can. If you do not have a sharps container, call your pharmacist or healthcare provider to get one. Talk to your pediatrician regarding the use of this medicine in children. While this drug may be prescribed for selected conditions, precautions do apply. Overdosage: If you think you have taken too much of this medicine contact a poison control center or emergency room at once. NOTE: This medicine is only for you. Do not share this medicine with others. What if I miss a dose? It is important not to miss your dose. Call your doctor or health care professional if you miss your dose. If you miss a dose due to an On-body Injector failure or leakage, a new dose should be administered as soon as  possible using a single prefilled syringe for manual use. What may interact with this medicine? Interactions have not been studied. Give your health care provider a list of all the medicines, herbs, non-prescription drugs, or dietary supplements you use. Also tell them if you smoke, drink alcohol, or use illegal drugs. Some items may interact with your medicine. This list may not describe all possible interactions. Give your health care provider a list of all the medicines, herbs, non-prescription drugs, or dietary supplements you use. Also tell them if you smoke, drink alcohol, or use illegal drugs. Some items may interact with your medicine. What should I watch for while using this medicine? You may need blood work done while you are taking this medicine. If you are going to need a MRI, CT scan, or other procedure, tell your doctor that you are using this medicine (On-Body Injector only). What side effects may I notice from receiving this medicine? Side effects that you should report to your doctor or health care professional as soon as  possible: -allergic reactions like skin rash, itching or hives, swelling of the face, lips, or tongue -dizziness -fever -pain, redness, or irritation at site where injected -pinpoint red spots on the skin -red or dark-brown urine -shortness of breath or breathing problems -stomach or side pain, or pain at the shoulder -swelling -tiredness -trouble passing urine or change in the amount of urine Side effects that usually do not require medical attention (report to your doctor or health care professional if they continue or are bothersome): -bone pain -muscle pain This list may not describe all possible side effects. Call your doctor for medical advice about side effects. You may report side effects to FDA at 1-800-FDA-1088. Where should I keep my medicine? Keep out of the reach of children. Store pre-filled syringes in a refrigerator between 2 and 8 degrees  C (36 and 46 degrees F). Do not freeze. Keep in carton to protect from light. Throw away this medicine if it is left out of the refrigerator for more than 48 hours. Throw away any unused medicine after the expiration date. NOTE: This sheet is a summary. It may not cover all possible information. If you have questions about this medicine, talk to your doctor, pharmacist, or health care provider.    2016, [employer] Standard. (2014-02-06 14:30:14)

## 2015-11-03 ENCOUNTER — Inpatient Hospital Stay: Payer: Medicare Other | Attending: Internal Medicine

## 2015-11-03 DIAGNOSIS — C3431 Malignant neoplasm of lower lobe, right bronchus or lung: Secondary | ICD-10-CM | POA: Insufficient documentation

## 2015-11-03 DIAGNOSIS — Z79899 Other long term (current) drug therapy: Secondary | ICD-10-CM | POA: Insufficient documentation

## 2015-11-03 DIAGNOSIS — Z5111 Encounter for antineoplastic chemotherapy: Secondary | ICD-10-CM | POA: Insufficient documentation

## 2015-11-03 DIAGNOSIS — D509 Iron deficiency anemia, unspecified: Secondary | ICD-10-CM | POA: Insufficient documentation

## 2015-11-03 DIAGNOSIS — K769 Liver disease, unspecified: Secondary | ICD-10-CM | POA: Insufficient documentation

## 2015-11-03 DIAGNOSIS — I10 Essential (primary) hypertension: Secondary | ICD-10-CM | POA: Insufficient documentation

## 2015-11-03 DIAGNOSIS — E785 Hyperlipidemia, unspecified: Secondary | ICD-10-CM | POA: Insufficient documentation

## 2015-11-03 DIAGNOSIS — C787 Secondary malignant neoplasm of liver and intrahepatic bile duct: Secondary | ICD-10-CM | POA: Insufficient documentation

## 2015-11-03 DIAGNOSIS — E119 Type 2 diabetes mellitus without complications: Secondary | ICD-10-CM | POA: Insufficient documentation

## 2015-11-03 DIAGNOSIS — I700 Atherosclerosis of aorta: Secondary | ICD-10-CM | POA: Insufficient documentation

## 2015-11-03 DIAGNOSIS — Z87891 Personal history of nicotine dependence: Secondary | ICD-10-CM | POA: Insufficient documentation

## 2015-11-03 DIAGNOSIS — R918 Other nonspecific abnormal finding of lung field: Secondary | ICD-10-CM | POA: Insufficient documentation

## 2015-11-03 DIAGNOSIS — Z8546 Personal history of malignant neoplasm of prostate: Secondary | ICD-10-CM | POA: Insufficient documentation

## 2015-11-03 DIAGNOSIS — N400 Enlarged prostate without lower urinary tract symptoms: Secondary | ICD-10-CM | POA: Insufficient documentation

## 2015-11-03 DIAGNOSIS — M1990 Unspecified osteoarthritis, unspecified site: Secondary | ICD-10-CM | POA: Insufficient documentation

## 2015-11-03 DIAGNOSIS — Z7984 Long term (current) use of oral hypoglycemic drugs: Secondary | ICD-10-CM | POA: Insufficient documentation

## 2015-11-03 DIAGNOSIS — I2510 Atherosclerotic heart disease of native coronary artery without angina pectoris: Secondary | ICD-10-CM | POA: Insufficient documentation

## 2015-11-03 DIAGNOSIS — K219 Gastro-esophageal reflux disease without esophagitis: Secondary | ICD-10-CM | POA: Insufficient documentation

## 2015-11-04 ENCOUNTER — Inpatient Hospital Stay: Payer: Medicare Other

## 2015-11-04 ENCOUNTER — Telehealth: Payer: Self-pay | Admitting: *Deleted

## 2015-11-04 ENCOUNTER — Telehealth (INDEPENDENT_AMBULATORY_CARE_PROVIDER_SITE_OTHER): Payer: Self-pay

## 2015-11-04 ENCOUNTER — Other Ambulatory Visit: Payer: Self-pay | Admitting: Internal Medicine

## 2015-11-04 VITALS — BP 127/80 | HR 103 | Temp 97.5°F | Resp 18

## 2015-11-04 DIAGNOSIS — Z87891 Personal history of nicotine dependence: Secondary | ICD-10-CM | POA: Diagnosis not present

## 2015-11-04 DIAGNOSIS — Z8546 Personal history of malignant neoplasm of prostate: Secondary | ICD-10-CM | POA: Diagnosis not present

## 2015-11-04 DIAGNOSIS — E785 Hyperlipidemia, unspecified: Secondary | ICD-10-CM | POA: Diagnosis not present

## 2015-11-04 DIAGNOSIS — R918 Other nonspecific abnormal finding of lung field: Secondary | ICD-10-CM | POA: Diagnosis not present

## 2015-11-04 DIAGNOSIS — Z5111 Encounter for antineoplastic chemotherapy: Secondary | ICD-10-CM | POA: Diagnosis present

## 2015-11-04 DIAGNOSIS — C3431 Malignant neoplasm of lower lobe, right bronchus or lung: Secondary | ICD-10-CM

## 2015-11-04 DIAGNOSIS — I2510 Atherosclerotic heart disease of native coronary artery without angina pectoris: Secondary | ICD-10-CM | POA: Diagnosis not present

## 2015-11-04 DIAGNOSIS — M1990 Unspecified osteoarthritis, unspecified site: Secondary | ICD-10-CM | POA: Diagnosis not present

## 2015-11-04 DIAGNOSIS — K769 Liver disease, unspecified: Secondary | ICD-10-CM | POA: Diagnosis not present

## 2015-11-04 DIAGNOSIS — Z79899 Other long term (current) drug therapy: Secondary | ICD-10-CM | POA: Diagnosis not present

## 2015-11-04 DIAGNOSIS — K219 Gastro-esophageal reflux disease without esophagitis: Secondary | ICD-10-CM | POA: Diagnosis not present

## 2015-11-04 DIAGNOSIS — I700 Atherosclerosis of aorta: Secondary | ICD-10-CM | POA: Diagnosis not present

## 2015-11-04 DIAGNOSIS — C787 Secondary malignant neoplasm of liver and intrahepatic bile duct: Secondary | ICD-10-CM | POA: Diagnosis not present

## 2015-11-04 DIAGNOSIS — E119 Type 2 diabetes mellitus without complications: Secondary | ICD-10-CM | POA: Diagnosis not present

## 2015-11-04 DIAGNOSIS — D509 Iron deficiency anemia, unspecified: Secondary | ICD-10-CM | POA: Diagnosis not present

## 2015-11-04 DIAGNOSIS — N400 Enlarged prostate without lower urinary tract symptoms: Secondary | ICD-10-CM | POA: Diagnosis not present

## 2015-11-04 DIAGNOSIS — I10 Essential (primary) hypertension: Secondary | ICD-10-CM | POA: Diagnosis not present

## 2015-11-04 DIAGNOSIS — Z7984 Long term (current) use of oral hypoglycemic drugs: Secondary | ICD-10-CM | POA: Diagnosis not present

## 2015-11-04 LAB — COMPREHENSIVE METABOLIC PANEL
ALBUMIN: 3.7 g/dL (ref 3.5–5.0)
ALK PHOS: 279 U/L — AB (ref 38–126)
ALT: 105 U/L — AB (ref 17–63)
ANION GAP: 12 (ref 5–15)
AST: 141 U/L — ABNORMAL HIGH (ref 15–41)
BILIRUBIN TOTAL: 1.2 mg/dL (ref 0.3–1.2)
BUN: 18 mg/dL (ref 6–20)
CALCIUM: 9.7 mg/dL (ref 8.9–10.3)
CO2: 21 mmol/L — AB (ref 22–32)
CREATININE: 0.83 mg/dL (ref 0.61–1.24)
Chloride: 105 mmol/L (ref 101–111)
GFR calc Af Amer: >60 mL/min (ref >60)
GFR calc non Af Amer: >60 mL/min (ref >60)
GLUCOSE: 182 mg/dL — AB (ref 65–99)
Potassium: 4.2 mmol/L (ref 3.5–5.1)
Sodium: 138 mmol/L (ref 135–145)
TOTAL PROTEIN: 7.1 g/dL (ref 6.5–8.1)

## 2015-11-04 LAB — CBC WITH DIFFERENTIAL/PLATELET
BASOS PCT: 0 %
Basophils Absolute: 0 10*3/uL (ref 0–0.1)
Eosinophils Absolute: 0.1 10*3/uL (ref 0–0.7)
Eosinophils Relative: 2 %
HEMATOCRIT: 39.4 % — AB (ref 40.0–52.0)
HEMOGLOBIN: 13 g/dL (ref 13.0–18.0)
LYMPHS ABS: 1 10*3/uL (ref 1.0–3.6)
Lymphocytes Relative: 17 %
MCH: 25.1 pg — ABNORMAL LOW (ref 26.0–34.0)
MCHC: 33.1 g/dL (ref 32.0–36.0)
MCV: 75.7 fL — ABNORMAL LOW (ref 80.0–100.0)
MONOS PCT: 11 %
Monocytes Absolute: 0.7 10*3/uL (ref 0.2–1.0)
NEUTROS ABS: 4.2 10*3/uL (ref 1.4–6.5)
NEUTROS PCT: 70 %
Platelets: 158 10*3/uL (ref 150–440)
RBC: 5.2 MIL/uL (ref 4.40–5.90)
RDW: 17.2 % — ABNORMAL HIGH (ref 11.5–14.5)
WBC: 6.1 10*3/uL (ref 3.8–10.6)

## 2015-11-04 MED ORDER — SODIUM CHLORIDE 0.9 % IV SOLN
Freq: Once | INTRAVENOUS | Status: AC
  Administered 2015-11-04: 12:00:00 via INTRAVENOUS
  Filled 2015-11-04: qty 1000

## 2015-11-04 MED ORDER — PALONOSETRON HCL INJECTION 0.25 MG/5ML
0.2500 mg | Freq: Once | INTRAVENOUS | Status: AC
  Administered 2015-11-04: 0.25 mg via INTRAVENOUS
  Filled 2015-11-04: qty 5

## 2015-11-04 MED ORDER — SODIUM CHLORIDE 0.9 % IV SOLN
576.0000 mg | Freq: Once | INTRAVENOUS | Status: AC
  Administered 2015-11-04: 580 mg via INTRAVENOUS
  Filled 2015-11-04: qty 58

## 2015-11-04 MED ORDER — ETOPOSIDE CHEMO INJECTION 1 GM/50ML
100.0000 mg/m2 | Freq: Once | INTRAVENOUS | Status: AC
Start: 2015-11-04 — End: 2015-11-04
  Administered 2015-11-04: 230 mg via INTRAVENOUS
  Filled 2015-11-04: qty 11.5

## 2015-11-04 MED ORDER — SODIUM CHLORIDE 0.9 % IV SOLN
10.0000 mg | Freq: Once | INTRAVENOUS | Status: AC
  Administered 2015-11-04: 10 mg via INTRAVENOUS
  Filled 2015-11-04: qty 1

## 2015-11-04 NOTE — Telephone Encounter (Signed)
rn left msg with Mickel Baas at Dr. Bunnie Domino office @ [place] [facility].  Waiting on reply. Fax for port placement sent yesterday. 11/03/15

## 2015-11-04 NOTE — Progress Notes (Signed)
Pt's LFTs elevated. Dr Rogue Bussing aware and okay to proceed with treatment

## 2015-11-04 NOTE — Telephone Encounter (Signed)
Left message for patient

## 2015-11-04 NOTE — Telephone Encounter (Signed)
-----   Message from Johnsie Cancel, RN sent at 11/04/2015 12:05 PM EDT ----- Regarding: port placement Pt is here for 1st tx and will be here for the next couple of hours. Pt's wife asking if there has been any appts made for his port placement. She is under the impression he is to get one on Wednesday, but doesn't have a date or who is going to do it.

## 2015-11-05 ENCOUNTER — Inpatient Hospital Stay: Payer: Medicare Other

## 2015-11-05 VITALS — BP 130/78 | HR 86 | Temp 97.4°F | Resp 18

## 2015-11-05 DIAGNOSIS — Z5111 Encounter for antineoplastic chemotherapy: Secondary | ICD-10-CM | POA: Diagnosis not present

## 2015-11-05 MED ORDER — DEXAMETHASONE SODIUM PHOSPHATE 100 MG/10ML IJ SOLN
10.0000 mg | Freq: Once | INTRAMUSCULAR | Status: AC
  Administered 2015-11-05: 10 mg via INTRAVENOUS
  Filled 2015-11-05: qty 1

## 2015-11-05 MED ORDER — ACETAMINOPHEN 325 MG PO TABS
650.0000 mg | ORAL_TABLET | Freq: Once | ORAL | Status: AC
  Administered 2015-11-05: 650 mg via ORAL

## 2015-11-05 MED ORDER — SODIUM CHLORIDE 0.9 % IV SOLN
100.0000 mg/m2 | Freq: Once | INTRAVENOUS | Status: AC
  Administered 2015-11-05: 230 mg via INTRAVENOUS
  Filled 2015-11-05: qty 11.5

## 2015-11-05 MED ORDER — SODIUM CHLORIDE 0.9 % IV SOLN
Freq: Once | INTRAVENOUS | Status: AC
  Administered 2015-11-05: 15:00:00 via INTRAVENOUS
  Filled 2015-11-05: qty 1000

## 2015-11-05 NOTE — Telephone Encounter (Signed)
Port placement on 10/10. Mickel Baas at Dr. Delana Meyer- contacted pt's wife.

## 2015-11-06 ENCOUNTER — Inpatient Hospital Stay: Payer: Medicare Other

## 2015-11-06 VITALS — BP 147/84 | HR 87 | Temp 98.2°F | Resp 18

## 2015-11-06 DIAGNOSIS — Z5111 Encounter for antineoplastic chemotherapy: Secondary | ICD-10-CM | POA: Diagnosis not present

## 2015-11-06 DIAGNOSIS — C3431 Malignant neoplasm of lower lobe, right bronchus or lung: Secondary | ICD-10-CM

## 2015-11-06 MED ORDER — SODIUM CHLORIDE 0.9 % IV SOLN
Freq: Once | INTRAVENOUS | Status: AC
  Administered 2015-11-06: 15:00:00 via INTRAVENOUS
  Filled 2015-11-06: qty 1000

## 2015-11-06 MED ORDER — SODIUM CHLORIDE 0.9 % IV SOLN
100.0000 mg/m2 | Freq: Once | INTRAVENOUS | Status: AC
  Administered 2015-11-06: 230 mg via INTRAVENOUS
  Filled 2015-11-06: qty 11.5

## 2015-11-06 MED ORDER — PEGFILGRASTIM 6 MG/0.6ML ~~LOC~~ PSKT
6.0000 mg | PREFILLED_SYRINGE | Freq: Once | SUBCUTANEOUS | Status: AC
  Administered 2015-11-06: 6 mg via SUBCUTANEOUS
  Filled 2015-11-06: qty 0.6

## 2015-11-06 MED ORDER — SODIUM CHLORIDE 0.9 % IV SOLN
10.0000 mg | Freq: Once | INTRAVENOUS | Status: AC
  Administered 2015-11-06: 10 mg via INTRAVENOUS
  Filled 2015-11-06: qty 1

## 2015-11-09 ENCOUNTER — Emergency Department
Admission: EM | Admit: 2015-11-09 | Discharge: 2015-11-10 | Disposition: A | Discharge status: Other Acute Inpatient Hospital | Payer: Medicare Other | Attending: Emergency Medicine | Admitting: Emergency Medicine

## 2015-11-09 ENCOUNTER — Emergency Department: Payer: Medicare Other

## 2015-11-09 ENCOUNTER — Other Ambulatory Visit (INDEPENDENT_AMBULATORY_CARE_PROVIDER_SITE_OTHER): Payer: Self-pay | Admitting: Vascular Surgery

## 2015-11-09 ENCOUNTER — Encounter: Payer: Self-pay | Admitting: Emergency Medicine

## 2015-11-09 DIAGNOSIS — Z87891 Personal history of nicotine dependence: Secondary | ICD-10-CM | POA: Insufficient documentation

## 2015-11-09 DIAGNOSIS — K625 Hemorrhage of anus and rectum: Secondary | ICD-10-CM

## 2015-11-09 DIAGNOSIS — I10 Essential (primary) hypertension: Secondary | ICD-10-CM | POA: Insufficient documentation

## 2015-11-09 DIAGNOSIS — Z8546 Personal history of malignant neoplasm of prostate: Secondary | ICD-10-CM | POA: Diagnosis not present

## 2015-11-09 DIAGNOSIS — Z794 Long term (current) use of insulin: Secondary | ICD-10-CM | POA: Diagnosis not present

## 2015-11-09 DIAGNOSIS — Z79899 Other long term (current) drug therapy: Secondary | ICD-10-CM | POA: Diagnosis not present

## 2015-11-09 DIAGNOSIS — E119 Type 2 diabetes mellitus without complications: Secondary | ICD-10-CM | POA: Insufficient documentation

## 2015-11-09 DIAGNOSIS — K921 Melena: Secondary | ICD-10-CM | POA: Diagnosis not present

## 2015-11-09 DIAGNOSIS — Z85118 Personal history of other malignant neoplasm of bronchus and lung: Secondary | ICD-10-CM | POA: Insufficient documentation

## 2015-11-09 MED ORDER — SODIUM CHLORIDE 0.9 % IV BOLUS (SEPSIS)
1000.0000 mL | Freq: Once | INTRAVENOUS | Status: AC
  Administered 2015-11-10: 1000 mL via INTRAVENOUS

## 2015-11-09 NOTE — ED Provider Notes (Signed)
[facility] Emergency Department Provider Note   ____________________________________________   First MD Initiated Contact with Patient 11/09/15 2324     (approximate)  I have reviewed the triage vital signs and the nursing notes.   HISTORY  Chief Complaint Rectal Bleeding    HPI [name] is a 75 y.o. male who presents to the ED from home with a chief complaint of rectal bleeding. Patient has a history of lung cancer, on chemotherapy who had an episode of rectal bleeding prior to arrival. Felt like he was having gas but instead produced blood without stool from the rectum.Wife states blood was bright red, followed by dark clots. Patient denies fever, chills, chest pain, shortness of breath, abdominal pain, nausea, vomiting, hematuria, diarrhea. He is bedbound and requires full care by his spouse. Takes 81 mg aspirin daily. Spouse states last week he sat on his testicle while on the toilet and it has been swollen improve since. Denies recent travel or trauma. Has not had prior gastrointestinal bleeding previously. Spouse states he has had issues with anemia requiring transfusion previously secondary to hematuria. Wife tells me he is scheduled for port placement in the morning.   Past Medical History:  Diagnosis Date  . Arthritis   . GERD (gastroesophageal reflux disease)   . Hyperlipidemia   . Hypertension   . 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 Prostate cancer ([place]) 03/2013   had seed implant and radiation for 5 weeks  . Type 2 diabetes mellitus ([place])   . Wears glasses     Patient Active Problem List   Diagnosis Date Noted  . Primary cancer of right lower lobe of lung ([place]) 10/30/2015  . Generalized weakness 10/30/2015  . Metastases to the liver ([place]) 10/20/2015  . Multiple lung nodules 10/20/2015  . Iron deficiency anemia due to chronic blood loss 10/20/2015  . Prostate cancer ([place]) 10/20/2015  . Blood loss anemia  03/17/2015  . Acute blood loss anemia 12/28/2014  . Hematuria, gross 12/28/2014  . Pneumonia 12/28/2014  . Type 2 diabetes mellitus ([place]) 03/10/2013  . hypercholesterolemia 03/10/2013  . hypertension 03/10/2013  . 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 INGUINAL HERNIA 08/15/2008    Past Surgical History:  Procedure Laterality Date  . COLONOSCOPY  2010  . CYSTOSCOPY N/A 07/04/2013   Procedure: CYSTOSCOPY FLEXIBLE;  Surgeon: Claybon Jabs, MD;  Location: [facility];  Service: Urology;  Laterality: N/A;  . INGUINAL HERNIA REPAIR Left 2005  . PROSTATE BIOPSY    . RADIOACTIVE SEED IMPLANT N/A 07/04/2013   Procedure: RADIOACTIVE SEED IMPLANT;  Surgeon: Claybon Jabs, MD;  Location: [facility];  Service: Urology;  Laterality: N/A;  . REPAIR CHRONIC INCARCERATED RECURRENT LEFT INGUINAL HERNIA  10-23-2008  . TRANSURETHRAL RESECTION OF BLADDER TUMOR N/A 01/02/2015   Procedure: Cystoscopy clot evalucation and fulgration;  Surgeon: Kathie Rhodes, MD;  Location: [facility];  Service: Urology;  Laterality: N/A;    Prior to Admission medications   Medication Sig Start Date End Date Taking? Authorizing Provider  ferrous sulfate 325 (65 FE) MG tablet Take 1 tablet (325 mg total) by mouth 2 (two) times daily with a meal. 03/20/15   Lavina Hamman, MD  Fluticasone Propionate (FLONASE NA) Place 1 spray into the nose daily.     Historical Provider, MD  furosemide (LASIX) 20 MG tablet Take 20 mg by mouth  daily.    Historical Provider, MD  guaiFENesin (MUCINEX) 600 MG 12 hr tablet Take 1,200 mg by mouth 2 (two) times daily as needed for cough or to loosen phlegm.     Historical Provider, MD  insulin NPH Human (HUMULIN N,NOVOLIN N) 100 UNIT/ML injection Inject 30 Units into the skin. Based on sliding scale    Historical Provider, MD  insulin regular (NOVOLIN R,HUMULIN R) 100 units/mL injection Inject 0.2 mLs (20 Units total)  into the skin 2 (two) times daily before a meal. 01/04/15   Florencia Reasons, MD  losartan (COZAAR) 25 MG tablet Take 25 mg by mouth daily.  03/17/15   Historical Provider, MD  metFORMIN (GLUCOPHAGE) 1000 MG tablet Take 1,000 mg by mouth 2 (two) times daily with a meal.  02/28/13   Historical Provider, MD  montelukast (SINGULAIR) 10 MG tablet Take 1 tablet by mouth daily. 10/19/15   Historical Provider, MD  omeprazole (PRILOSEC) 20 MG capsule Take 20 mg by mouth daily as needed (heartburn).     Historical Provider, MD  potassium chloride (K-DUR) 10 MEQ tablet Take 1 tablet by mouth 2 (two) times daily. 10/18/15   Historical Provider, MD  tamsulosin (FLOMAX) 0.4 MG CAPS capsule Take 1 capsule (0.4 mg total) by mouth daily after supper. 03/20/15   Lavina Hamman, MD  VENTOLIN HFA 108 (90 Base) MCG/ACT inhaler Inhale 1 puff into the lungs every 6 (six) hours as needed for shortness of breath. 09/11/15   Historical Provider, MD    Allergies Review of patient's allergies indicates no known allergies.  Family History  Problem Relation Age of Onset  . Cancer Mother     ovarian  . Heart disease Father   . Cancer Sister     breast  . Breast cancer Sister   . Cancer Brother     lung  . Cancer Brother     prostate  . Prostate cancer Brother   . Breast cancer Sister   . Lung cancer Brother   . Lung cancer Brother     Social History Social History  Substance Use Topics  . Smoking status: Former Smoker    Packs/day: 1.00    Years: 30.00    Types: Cigarettes    Quit date: 02/01/2008  . Smokeless tobacco: Never Used  . Alcohol use No    Review of Systems  Constitutional: No fever/chills. Eyes: No visual changes. ENT: No sore throat. Cardiovascular: Denies chest pain. Respiratory: Denies shortness of breath. Gastrointestinal: No abdominal pain.  No nausea, no vomiting.  No diarrhea.  No constipation. Positive for rectal bleeding. Genitourinary: Negative for dysuria. Musculoskeletal: Negative for  back pain. Skin: Negative for rash. Neurological: Negative for headaches, focal weakness or numbness.  10-point ROS otherwise negative.  ____________________________________________   PHYSICAL EXAM:  VITAL SIGNS: ED Triage Vitals  Enc Vitals Group     BP 11/09/15 2248 (!) 125/58     Pulse Rate 11/09/15 2248 98     Resp 11/09/15 2248 18     Temp 11/09/15 2248 98.2 F (36.8 C)     Temp Source 11/09/15 2248 Oral     SpO2 11/09/15 2248 95 %     Weight 11/09/15 2248 223 lb (101.2 kg)     Height 11/09/15 2248 6' (1.829 m)     Head Circumference --      Peak Flow --      Pain Score 11/09/15 2307 0     Pain Loc --  Pain Edu? --      Excl. in [place]? --     Constitutional: Alert and oriented. Well appearing and in no acute distress. Eyes: Conjunctivae are normal. PERRL. EOMI. Head: Atraumatic. Nose: No congestion/rhinnorhea. Mouth/Throat: Mucous membranes are moist.  Oropharynx non-erythematous. Neck: No stridor.   Cardiovascular: Normal rate, regular rhythm. Grossly normal heart sounds.  Good peripheral circulation. Respiratory: Normal respiratory effort.  No retractions. Lungs CTAB. Gastrointestinal: Soft and nontender. No distention. No abdominal bruits. No CVA tenderness. Genitourinary: Left scrotal sac with mild swelling. Skin appears irritated with mild breakdown. No active bleeding. No crepitus. No discoloration. Musculoskeletal: RUE with baseline contractures.  Neurologic:  Normal speech and language. No gross focal neurologic deficits are appreciated other than RUE contractures.  Skin:  Pale skin. Skin is warm, dry and intact. No rash noted. Psychiatric: Mood and affect are normal. Speech and behavior are normal.  ____________________________________________   LABS (all labs ordered are listed, but only abnormal results are displayed)  Labs Reviewed  CBC WITH DIFFERENTIAL/PLATELET - Abnormal; Notable for the following:       Result Value   Hemoglobin 11.9 (*)     HCT 36.6 (*)    MCV 76.3 (*)    MCH 24.9 (*)    RDW 16.7 (*)    Platelets 149 (*)    Neutro Abs 7.7 (*)    Monocytes Absolute 0.1 (*)    All other components within normal limits  COMPREHENSIVE METABOLIC PANEL - Abnormal; Notable for the following:    Glucose, Bld 195 (*)    BUN 26 (*)    Albumin 3.2 (*)    AST 77 (*)    ALT 64 (*)    Alkaline Phosphatase 244 (*)    All other components within normal limits  TROPONIN I - Abnormal; Notable for the following:    Troponin I 0.03 (*)    All other components within normal limits  PROTIME-INR  TROPONIN I  TYPE AND SCREEN   ____________________________________________  EKG  ED ECG REPORT I, Yader Criger J, the attending physician, personally viewed and interpreted this ECG.   Date: 11/10/2015  EKG Time: 0025  Rate: 87  Rhythm: normal EKG, normal sinus rhythm  Axis: Normal  Intervals:none  ST&T Change: Nonspecific  ____________________________________________  RADIOLOGY  Portable chest x-ray (viewed by me, interpreted per Dr. Radene Knee): Known bilateral lung cancer is better characterized on recent CT.  Previously noted pulmonary nodules are less apparent on the current  study, likely reflecting mild interval improvement. Large right  basilar lung mass again noted. Elevation of the right hemidiaphragm     ____________________________________________   PROCEDURES  Procedure(s) performed:   Rectal exam: External exam reveals small, nonthrombosed, nonbleeding hemorrhoid. DRE nontender. Hematochezia noted. Guiac +.  Procedures  Critical Care performed: Yes, see critical care note(s)   CRITICAL CARE Performed by: Paulette Blanch   Total critical care time: 30 minutes  Critical care time was exclusive of separately billable procedures and treating other patients.  Critical care was necessary to treat or prevent imminent or life-threatening deterioration.  Critical care was time spent personally by me on the following  activities: development of treatment plan with patient and/or surrogate as well as nursing, discussions with consultants, evaluation of patient's response to treatment, examination of patient, obtaining history from patient or surrogate, ordering and performing treatments and interventions, ordering and review of laboratory studies, ordering and review of radiographic studies, pulse oximetry and re-evaluation of patient's condition.  ____________________________________________   INITIAL  IMPRESSION / ASSESSMENT AND PLAN / ED COURSE  Pertinent labs & imaging results that were available during my care of the patient were reviewed by me and considered in my medical decision making (see chart for details).  75 year old male with a remote history of prostate cancer status post radiation, current lung cancer on chemotherapy who presents with hematochezia. Hemodynamically stable currently. Pale-appearing, will obtain screening lab work including type and screen. There is no GI coverage from 7 AM until 11/13/2015. I have discussed this with the patient and his spouse that he will require transfer to a facility with GI coverage. They have requested transfer to [facility] Awaiting lab work and will call transfer center.  Clinical Course  Comment By Time  [facility] has no bed availability. Will contact [facility]. Paulette Blanch, MD 10/10 [phone]  Accepted by Dr. Tamala Julian from [facility]. Spouse now tells me she desires [facility] as they have family members who work there. Janeece Riggers will call [facility] to inquire about bed availability. Paulette Blanch, MD 10/10 [phone]  Accepted by Dr. Maricela Bo [facility] ED). Patient and spouse updated. Paulette Blanch, MD 10/10 [phone]     ____________________________________________   FINAL CLINICAL IMPRESSION(S) / ED DIAGNOSES  Final diagnoses:  Rectal bleeding  Hematochezia      NEW MEDICATIONS STARTED DURING THIS VISIT:  New Prescriptions   No medications on file     Note:  This document  was prepared using Dragon voice recognition software and may include unintentional dictation errors.    Paulette Blanch, MD 11/10/15 [phone]

## 2015-11-09 NOTE — ED Notes (Signed)
Pt. Wife reports numerous clots from the pt. Rectum that started this evening about 10 oclock when the pt just believed he was passing a lot of gas.

## 2015-11-09 NOTE — ED Triage Notes (Signed)
Pt presents to ED with c/o rectal bleeding, pt is chemo patient. Pt reports had one episode of rectal bleeding. Pt denies abdominal pain or chest pain. Pt alert and oriented x 4, no increased work  In breathing noted.

## 2015-11-10 ENCOUNTER — Encounter
Admission: EM | Disposition: A | Discharge status: Other Acute Inpatient Hospital | Payer: Self-pay | Source: Home / Self Care | Attending: Emergency Medicine

## 2015-11-10 ENCOUNTER — Observation Stay
Admission: AD | Admit: 2015-11-10 | Payer: Self-pay | Source: Other Acute Inpatient Hospital | Admitting: Internal Medicine

## 2015-11-10 ENCOUNTER — Other Ambulatory Visit: Payer: Self-pay

## 2015-11-10 ENCOUNTER — Ambulatory Visit: Admission: RE | Admit: 2015-11-10 | Payer: Medicare Other | Source: Ambulatory Visit | Admitting: Vascular Surgery

## 2015-11-10 DIAGNOSIS — K625 Hemorrhage of anus and rectum: Secondary | ICD-10-CM

## 2015-11-10 LAB — COMPREHENSIVE METABOLIC PANEL
ALBUMIN: 3.2 g/dL — AB (ref 3.5–5.0)
ALT: 64 U/L — AB (ref 17–63)
AST: 77 U/L — AB (ref 15–41)
Alkaline Phosphatase: 244 U/L — ABNORMAL HIGH (ref 38–126)
Anion gap: 8 (ref 5–15)
BUN: 26 mg/dL — AB (ref 6–20)
CHLORIDE: 106 mmol/L (ref 101–111)
CO2: 25 mmol/L (ref 22–32)
CREATININE: 0.94 mg/dL (ref 0.61–1.24)
Calcium: 8.9 mg/dL (ref 8.9–10.3)
GFR calc Af Amer: >60 mL/min (ref >60)
GFR calc non Af Amer: >60 mL/min (ref >60)
Glucose, Bld: 195 mg/dL — ABNORMAL HIGH (ref 65–99)
Potassium: 4.6 mmol/L (ref 3.5–5.1)
SODIUM: 139 mmol/L (ref 135–145)
Total Bilirubin: 1.2 mg/dL (ref 0.3–1.2)
Total Protein: 6.7 g/dL (ref 6.5–8.1)

## 2015-11-10 LAB — CBC WITH DIFFERENTIAL/PLATELET
BASOS ABS: 0 10*3/uL (ref 0–0.1)
BASOS PCT: 0 %
EOS ABS: 0.1 10*3/uL (ref 0–0.7)
EOS PCT: 1 %
HCT: 36.6 % — ABNORMAL LOW (ref 40.0–52.0)
Hemoglobin: 11.9 g/dL — ABNORMAL LOW (ref 13.0–18.0)
LYMPHS PCT: 12 %
Lymphs Abs: 1 10*3/uL (ref 1.0–3.6)
MCH: 24.9 pg — ABNORMAL LOW (ref 26.0–34.0)
MCHC: 32.6 g/dL (ref 32.0–36.0)
MCV: 76.3 fL — ABNORMAL LOW (ref 80.0–100.0)
Monocytes Absolute: 0.1 10*3/uL — ABNORMAL LOW (ref 0.2–1.0)
Monocytes Relative: 1 %
Neutro Abs: 7.7 10*3/uL — ABNORMAL HIGH (ref 1.4–6.5)
Neutrophils Relative %: 86 %
PLATELETS: 149 10*3/uL — AB (ref 150–440)
RBC: 4.79 MIL/uL (ref 4.40–5.90)
RDW: 16.7 % — AB (ref 11.5–14.5)
WBC: 9 10*3/uL (ref 3.8–10.6)

## 2015-11-10 LAB — PROTIME-INR
INR: 0.95
Prothrombin Time: 12.7 seconds (ref 11.4–15.2)

## 2015-11-10 LAB — TROPONIN I
Troponin I: 0.03 ng/mL (ref <0.03)
Troponin I: <0.03 ng/mL (ref <0.03)

## 2015-11-10 SURGERY — PORTA CATH INSERTION
Anesthesia: Moderate Sedation

## 2015-11-10 MED ORDER — CEFUROXIME SODIUM 1.5 G IJ SOLR
1.5000 g | INTRAMUSCULAR | Status: DC
  Filled 2015-11-10: qty 1.5

## 2015-11-10 MED ORDER — SODIUM CHLORIDE 0.9 % IR SOLN
Freq: Once | Status: DC
  Filled 2015-11-10: qty 2

## 2015-11-10 NOTE — Progress Notes (Signed)
Transfer from [facility]. [name] is 75 year old male with a past medical history significant lung cancer on chemotherapy; who presented for new onset hematochezia started on 10/9. Patient is totally dependent on wife for care. At outside facility patient found to be guaiac positive with rectal bleeding with clots. Patient noted to be hemodynamically stable. Initial H&H 11.9/36(previously 13/39.4 on 10/4). BUN 26, creatinine 0.97, troponin 0.03. Admitted to a telemetry bed for need of GI consultation as no GI available at [place] from 10/10 at 7 AM till Friday.

## 2015-11-11 ENCOUNTER — Ambulatory Visit: Admission: RE | Admit: 2015-11-11 | Payer: Medicare Other | Source: Ambulatory Visit

## 2015-11-16 ENCOUNTER — Inpatient Hospital Stay: Payer: Medicare Other

## 2015-11-16 ENCOUNTER — Inpatient Hospital Stay: Payer: Medicare Other | Admitting: Internal Medicine

## 2015-11-17 ENCOUNTER — Inpatient Hospital Stay
Admission: EM | Admit: 2015-11-17 | Discharge: 2015-11-23 | DRG: 377 | Disposition: A | Discharge status: Skilled Nursing Facility | Payer: Medicare Other | Attending: Internal Medicine | Admitting: Internal Medicine

## 2015-11-17 ENCOUNTER — Encounter: Payer: Self-pay | Admitting: Emergency Medicine

## 2015-11-17 DIAGNOSIS — Z66 Do not resuscitate: Secondary | ICD-10-CM | POA: Diagnosis present

## 2015-11-17 DIAGNOSIS — C3490 Malignant neoplasm of unspecified part of unspecified bronchus or lung: Secondary | ICD-10-CM | POA: Diagnosis present

## 2015-11-17 DIAGNOSIS — Z7982 Long term (current) use of aspirin: Secondary | ICD-10-CM

## 2015-11-17 DIAGNOSIS — C7931 Secondary malignant neoplasm of brain: Secondary | ICD-10-CM | POA: Diagnosis present

## 2015-11-17 DIAGNOSIS — Z515 Encounter for palliative care: Secondary | ICD-10-CM | POA: Diagnosis present

## 2015-11-17 DIAGNOSIS — M109 Gout, unspecified: Secondary | ICD-10-CM | POA: Diagnosis present

## 2015-11-17 DIAGNOSIS — K5791 Diverticulosis of intestine, part unspecified, without perforation or abscess with bleeding: Secondary | ICD-10-CM | POA: Diagnosis not present

## 2015-11-17 DIAGNOSIS — Z794 Long term (current) use of insulin: Secondary | ICD-10-CM

## 2015-11-17 DIAGNOSIS — G936 Cerebral edema: Secondary | ICD-10-CM | POA: Diagnosis present

## 2015-11-17 DIAGNOSIS — Z8546 Personal history of malignant neoplasm of prostate: Secondary | ICD-10-CM

## 2015-11-17 DIAGNOSIS — I10 Essential (primary) hypertension: Secondary | ICD-10-CM | POA: Diagnosis present

## 2015-11-17 DIAGNOSIS — E785 Hyperlipidemia, unspecified: Secondary | ICD-10-CM | POA: Diagnosis present

## 2015-11-17 DIAGNOSIS — D3A8 Other benign neuroendocrine tumors: Secondary | ICD-10-CM | POA: Diagnosis present

## 2015-11-17 DIAGNOSIS — Z9221 Personal history of antineoplastic chemotherapy: Secondary | ICD-10-CM

## 2015-11-17 DIAGNOSIS — K922 Gastrointestinal hemorrhage, unspecified: Secondary | ICD-10-CM | POA: Diagnosis not present

## 2015-11-17 DIAGNOSIS — M6281 Muscle weakness (generalized): Secondary | ICD-10-CM

## 2015-11-17 DIAGNOSIS — Z79899 Other long term (current) drug therapy: Secondary | ICD-10-CM

## 2015-11-17 DIAGNOSIS — Z85118 Personal history of other malignant neoplasm of bronchus and lung: Secondary | ICD-10-CM

## 2015-11-17 DIAGNOSIS — N400 Enlarged prostate without lower urinary tract symptoms: Secondary | ICD-10-CM | POA: Diagnosis present

## 2015-11-17 DIAGNOSIS — R262 Difficulty in walking, not elsewhere classified: Secondary | ICD-10-CM

## 2015-11-17 DIAGNOSIS — R2689 Other abnormalities of gait and mobility: Secondary | ICD-10-CM

## 2015-11-17 DIAGNOSIS — Z8249 Family history of ischemic heart disease and other diseases of the circulatory system: Secondary | ICD-10-CM

## 2015-11-17 DIAGNOSIS — R634 Abnormal weight loss: Secondary | ICD-10-CM | POA: Diagnosis present

## 2015-11-17 DIAGNOSIS — Z8042 Family history of malignant neoplasm of prostate: Secondary | ICD-10-CM

## 2015-11-17 DIAGNOSIS — G9340 Encephalopathy, unspecified: Secondary | ICD-10-CM

## 2015-11-17 DIAGNOSIS — C61 Malignant neoplasm of prostate: Secondary | ICD-10-CM | POA: Diagnosis present

## 2015-11-17 DIAGNOSIS — D696 Thrombocytopenia, unspecified: Secondary | ICD-10-CM | POA: Diagnosis present

## 2015-11-17 DIAGNOSIS — L02415 Cutaneous abscess of right lower limb: Secondary | ICD-10-CM

## 2015-11-17 DIAGNOSIS — E119 Type 2 diabetes mellitus without complications: Secondary | ICD-10-CM | POA: Diagnosis present

## 2015-11-17 DIAGNOSIS — K219 Gastro-esophageal reflux disease without esophagitis: Secondary | ICD-10-CM | POA: Diagnosis present

## 2015-11-17 DIAGNOSIS — Z87891 Personal history of nicotine dependence: Secondary | ICD-10-CM

## 2015-11-17 HISTORY — DX: Malignant neoplasm of unspecified part of unspecified bronchus or lung: C34.90

## 2015-11-17 HISTORY — DX: Personal history of other medical treatment: Z92.89

## 2015-11-17 HISTORY — DX: Malignant (primary) neoplasm, unspecified: C80.1

## 2015-11-17 HISTORY — DX: Gout, unspecified: M10.9

## 2015-11-17 HISTORY — DX: Encounter for other specified aftercare: Z51.89

## 2015-11-17 LAB — CBC
HCT: 36.8 % — ABNORMAL LOW (ref 40.0–52.0)
Hemoglobin: 12 g/dL — ABNORMAL LOW (ref 13.0–18.0)
MCH: 24.7 pg — ABNORMAL LOW (ref 26.0–34.0)
MCHC: 32.7 g/dL (ref 32.0–36.0)
MCV: 75.5 fL — AB (ref 80.0–100.0)
PLATELETS: 51 10*3/uL — AB (ref 150–440)
RBC: 4.88 MIL/uL (ref 4.40–5.90)
RDW: 15.8 % — AB (ref 11.5–14.5)
WBC: 10.5 10*3/uL (ref 3.8–10.6)

## 2015-11-17 LAB — COMPREHENSIVE METABOLIC PANEL
ALK PHOS: 203 U/L — AB (ref 38–126)
ALT: 38 U/L (ref 17–63)
AST: 44 U/L — AB (ref 15–41)
Albumin: 3.2 g/dL — ABNORMAL LOW (ref 3.5–5.0)
Anion gap: 10 (ref 5–15)
BILIRUBIN TOTAL: 0.7 mg/dL (ref 0.3–1.2)
BUN: 15 mg/dL (ref 6–20)
CHLORIDE: 105 mmol/L (ref 101–111)
CO2: 22 mmol/L (ref 22–32)
CREATININE: 0.74 mg/dL (ref 0.61–1.24)
Calcium: 9.1 mg/dL (ref 8.9–10.3)
GFR calc Af Amer: >60 mL/min (ref >60)
Glucose, Bld: 128 mg/dL — ABNORMAL HIGH (ref 65–99)
Potassium: 4.3 mmol/L (ref 3.5–5.1)
Sodium: 137 mmol/L (ref 135–145)
Total Protein: 7.1 g/dL (ref 6.5–8.1)

## 2015-11-17 LAB — TYPE AND SCREEN
ABO/RH(D): B POS
Antibody Screen: NEGATIVE

## 2015-11-17 MED ORDER — SULFAMETHOXAZOLE-TRIMETHOPRIM 800-160 MG PO TABS
1.0000 | ORAL_TABLET | Freq: Once | ORAL | Status: AC
  Administered 2015-11-17: 1 via ORAL
  Filled 2015-11-17: qty 1

## 2015-11-17 NOTE — ED Provider Notes (Signed)
[facility] Emergency Department Provider Note  ____________________________________________  Time seen: Approximately 9:27 PM  I have reviewed the triage vital signs and the nursing notes.   HISTORY  Chief Complaint Rectal Bleeding   HPI [name] is a 75 y.o. male with a history of prostate cancer, lung cancer currently on chemotherapy metastatic to the liver, hypertension, hyperlipidemia, and diabetes who presents for evaluation of rectal bleeding. Patient reports that he had one episode of bright red blood per rectum yesterday at 9 PM. Patient was seen here last week with similar presentation. Was transferred to [facility] and discharged on the same day with no colonoscopy performed. Patient does have a history of diverticulosis. Patient denies having a bowel movement today. He denies abdominal pain, nausea, vomiting, chills. Patient is also complaining of small boils located in the inner thigh on the right side that have been present for the last few days.Patient is not on any blood thinners.  Past Medical History:  Diagnosis Date  . Arthritis   . GERD (gastroesophageal reflux disease)   . Hyperlipidemia   . Hypertension   . 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 Type 2 diabetes mellitus ([place])   . Wears glasses     Patient Active Problem List   Diagnosis Date Noted  . Primary cancer of right lower lobe of lung ([place]) 10/30/2015  . Generalized weakness 10/30/2015  . Metastases to the liver ([place]) 10/20/2015  . Multiple lung nodules 10/20/2015  . Iron deficiency anemia due to chronic blood loss 10/20/2015  . Prostate cancer ([place]) 10/20/2015  . Blood loss anemia 03/17/2015  . Acute blood loss anemia 12/28/2014  . Hematuria, gross 12/28/2014  . Pneumonia 12/28/2014  . Type 2 diabetes mellitus ([place]) 03/10/2013  . hypercholesterolemia 03/10/2013  .  hypertension 03/10/2013  . 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 INGUINAL HERNIA 08/15/2008    Past Surgical History:  Procedure Laterality Date  . COLONOSCOPY  2010  . CYSTOSCOPY N/A 07/04/2013   Procedure: CYSTOSCOPY FLEXIBLE;  Surgeon: Claybon Jabs, MD;  Location: [facility];  Service: Urology;  Laterality: N/A;  . INGUINAL HERNIA REPAIR Left 2005  . PROSTATE BIOPSY    . RADIOACTIVE SEED IMPLANT N/A 07/04/2013   Procedure: RADIOACTIVE SEED IMPLANT;  Surgeon: Claybon Jabs, MD;  Location: [facility];  Service: Urology;  Laterality: N/A;  . REPAIR CHRONIC INCARCERATED RECURRENT LEFT INGUINAL HERNIA  10-23-2008  . TRANSURETHRAL RESECTION OF BLADDER TUMOR N/A 01/02/2015   Procedure: Cystoscopy clot evalucation and fulgration;  Surgeon: Kathie Rhodes, MD;  Location: [facility];  Service: Urology;  Laterality: N/A;    Prior to Admission medications   Medication Sig Start Date End Date Taking? Authorizing Provider  aspirin EC 81 MG tablet Take 81 mg by mouth daily.   Yes Historical Provider, MD  ferrous sulfate 325 (65 FE) MG tablet Take 1 tablet (325 mg total) by mouth 2 (two) times daily with a meal. 03/20/15  Yes Lavina Hamman, MD  fexofenadine (ALLEGRA) 180 MG tablet Take 180 mg by mouth daily.   Yes Historical Provider, MD  Fluticasone Propionate (FLONASE NA) Place 1 spray into the nose daily.    Yes Historical Provider, MD  furosemide (LASIX) 20 MG tablet Take 20 mg by mouth daily.  Yes Historical Provider, MD  guaiFENesin (MUCINEX) 600 MG 12 hr tablet Take 1,200 mg by mouth daily.    Yes Historical Provider, MD  insulin NPH Human (HUMULIN N,NOVOLIN N) 100 UNIT/ML injection Inject 30 Units into the skin 2 (two) times daily before a meal. Based on sliding scale    Yes Historical Provider, MD  losartan (COZAAR) 25 MG tablet Take 25 mg by mouth daily.  03/17/15  Yes Historical Provider, MD  metFORMIN  (GLUCOPHAGE) 1000 MG tablet Take 1,000 mg by mouth 2 (two) times daily with a meal.  02/28/13  Yes Historical Provider, MD  montelukast (SINGULAIR) 10 MG tablet Take 1 tablet by mouth daily. 10/19/15  Yes Historical Provider, MD  omeprazole (PRILOSEC) 20 MG capsule Take 20 mg by mouth daily as needed (heartburn).    Yes Historical Provider, MD  potassium chloride (K-DUR) 10 MEQ tablet Take 1 tablet by mouth 2 (two) times daily. 10/18/15  Yes Historical Provider, MD  tamsulosin (FLOMAX) 0.4 MG CAPS capsule Take 1 capsule (0.4 mg total) by mouth daily after supper. 03/20/15  Yes Lavina Hamman, MD  VENTOLIN HFA 108 (90 Base) MCG/ACT inhaler Inhale 1 puff into the lungs every 6 (six) hours as needed for shortness of breath. 09/11/15  Yes Historical Provider, MD    Allergies Review of patient's allergies indicates no known allergies.  Family History  Problem Relation Age of Onset  . Cancer Mother     ovarian  . Heart disease Father   . Cancer Sister     breast  . Breast cancer Sister   . Cancer Brother     lung  . Cancer Brother     prostate  . Prostate cancer Brother   . Breast cancer Sister   . Lung cancer Brother   . Lung cancer Brother     Social History Social History  Substance Use Topics  . Smoking status: Former Smoker    Packs/day: 1.00    Years: 30.00    Types: Cigarettes    Quit date: 02/01/2008  . Smokeless tobacco: Never Used  . Alcohol use No    Review of Systems  Constitutional: Negative for fever. Eyes: Negative for visual changes. ENT: Negative for sore throat. Cardiovascular: Negative for chest pain. Respiratory: Negative for shortness of breath. Gastrointestinal: Negative for abdominal pain, vomiting or diarrhea. + rectal bleeding Genitourinary: Negative for dysuria.  Musculoskeletal: Negative for back pain. Skin: Negative for rash. Neurological: Negative for headaches, weakness or numbness.  ____________________________________________   PHYSICAL  EXAM:  VITAL SIGNS: ED Triage Vitals [11/17/15 1841]  Enc Vitals Group     BP 120/66     Pulse Rate 98     Resp 20     Temp 98.1 F (36.7 C)     Temp Source Oral     SpO2 98 %     Weight 223 lb (101.2 kg)     Height 6' (1.829 m)     Head Circumference      Peak Flow      Pain Score 0     Pain Loc      Pain Edu?      Excl. in [place]?     Constitutional: Alert and oriented. Well appearing and in no apparent distress. HEENT:      Head: Normocephalic and atraumatic.         Eyes: Conjunctivae are normal. Sclera is non-icteric. EOMI. PERRL      Mouth/Throat: Mucous membranes are moist.  Neck: Supple with no signs of meningismus. Cardiovascular: Regular rate and rhythm. No murmurs, gallops, or rubs. 2+ symmetrical distal pulses are present in all extremities. No JVD. Respiratory: Normal respiratory effort. Lungs are clear to auscultation bilaterally. No wheezes, crackles, or rhonchi.  Gastrointestinal: Soft, non tender, and non distended with positive bowel sounds. No rebound or guarding. Genitourinary: No CVA tenderness.Rectal exam showing light brown stool grossly Hemoccult positive Musculoskeletal: Nontender with normal range of motion in all extremities. No edema, cyanosis, or erythema of extremities. Neurologic: Normal speech and language. Face is symmetric. Moving all extremities. No gross focal neurologic deficits are appreciated. Skin: Small less than 0.5 cm abcesses located in the R inner thigh with mild overlying erythema, no crepitus, no bullae Psychiatric: Mood and affect are normal. Speech and behavior are normal.  ____________________________________________   LABS (all labs ordered are listed, but only abnormal results are displayed)  Labs Reviewed  COMPREHENSIVE METABOLIC PANEL - Abnormal; Notable for the following:       Result Value   Glucose, Bld 128 (*)    Albumin 3.2 (*)    AST 44 (*)    Alkaline Phosphatase 203 (*)    All other components within  normal limits  CBC - Abnormal; Notable for the following:    Hemoglobin 12.0 (*)    HCT 36.8 (*)    MCV 75.5 (*)    MCH 24.7 (*)    RDW 15.8 (*)    Platelets 51 (*)    All other components within normal limits  POC OCCULT BLOOD, ED  TYPE AND SCREEN   ____________________________________________  EKG  none  ____________________________________________  RADIOLOGY  none  ____________________________________________   PROCEDURES  Procedure(s) performed:  Marland KitchenMarland KitchenIncision and Drainage Date/Time: 11/17/2015 11:10 PM Performed by: Rudene Re Authorized by: Rudene Re   Consent:    Consent obtained:  Verbal   Consent given by:  Patient   Risks discussed:  Bleeding, incomplete drainage and infection Location:    Type:  Abscess   Size:  0.5   Location:  Lower extremity   Lower extremity location:  Leg   Leg location:  R upper leg Pre-procedure details:    Skin preparation:  Chloraprep Anesthesia (see MAR for exact dosages):    Anesthesia method:  None Procedure type:    Complexity:  Simple Procedure details:    Incision types:  Stab incision   Scalpel blade:  11   Wound management:  Probed and deloculated   Drainage:  Purulent   Drainage amount:  Scant   Wound treatment:  Wound left open   Packing materials:  None Post-procedure details:    Patient tolerance of procedure:  Tolerated well, no immediate complications   Critical Care performed:  None ____________________________________________   INITIAL IMPRESSION / ASSESSMENT AND PLAN / ED COURSE  75 y.o. male with a history of prostate cancer, lung cancer currently on chemotherapy metastatic to the liver, hypertension, hyperlipidemia, and diabetes who presents for evaluation of one episode of hematochezia < 24 hours ago. Rectal exam showing light brown stool grossly Hemoccult positive. Patient hemodynamically stable, stable hemoglobin at 12. Presentation concerning for diverticular bleed. I spoke  with the hospitalist who will admit patient. Also started patient on Bactrim for small abscesses that were drained at the bedside.  Clinical Course    Pertinent labs & imaging results that were available during my care of the patient were reviewed by me and considered in my medical decision making (see chart for details).  ____________________________________________   FINAL CLINICAL IMPRESSION(S) / ED DIAGNOSES  Final diagnoses:  Lower GI bleed  Abscess of right thigh      NEW MEDICATIONS STARTED DURING THIS VISIT:  New Prescriptions   No medications on file     Note:  This document was prepared using Dragon voice recognition software and may include unintentional dictation errors.    Rudene Re, MD 11/17/15 2312

## 2015-11-17 NOTE — ED Triage Notes (Signed)
Pt wife also notes that pt has "blisters in his thighs" and currently has redness and swelling and pain in toes in feet.

## 2015-11-17 NOTE — ED Triage Notes (Signed)
Pt to ed with c/o rectal bleeding.  Pt sent from dr sparks office.  Pt family states recent hospitalization for same.

## 2015-11-18 ENCOUNTER — Encounter: Payer: Self-pay | Admitting: Internal Medicine

## 2015-11-18 DIAGNOSIS — G9340 Encephalopathy, unspecified: Secondary | ICD-10-CM | POA: Diagnosis present

## 2015-11-18 DIAGNOSIS — D3A8 Other benign neuroendocrine tumors: Secondary | ICD-10-CM | POA: Diagnosis present

## 2015-11-18 DIAGNOSIS — C61 Malignant neoplasm of prostate: Secondary | ICD-10-CM | POA: Diagnosis present

## 2015-11-18 DIAGNOSIS — G936 Cerebral edema: Secondary | ICD-10-CM | POA: Diagnosis present

## 2015-11-18 DIAGNOSIS — Z8249 Family history of ischemic heart disease and other diseases of the circulatory system: Secondary | ICD-10-CM | POA: Diagnosis not present

## 2015-11-18 DIAGNOSIS — N400 Enlarged prostate without lower urinary tract symptoms: Secondary | ICD-10-CM | POA: Diagnosis present

## 2015-11-18 DIAGNOSIS — Z8042 Family history of malignant neoplasm of prostate: Secondary | ICD-10-CM | POA: Diagnosis not present

## 2015-11-18 DIAGNOSIS — K5791 Diverticulosis of intestine, part unspecified, without perforation or abscess with bleeding: Secondary | ICD-10-CM | POA: Diagnosis present

## 2015-11-18 DIAGNOSIS — E785 Hyperlipidemia, unspecified: Secondary | ICD-10-CM | POA: Diagnosis present

## 2015-11-18 DIAGNOSIS — D696 Thrombocytopenia, unspecified: Secondary | ICD-10-CM | POA: Diagnosis present

## 2015-11-18 DIAGNOSIS — R634 Abnormal weight loss: Secondary | ICD-10-CM | POA: Diagnosis present

## 2015-11-18 DIAGNOSIS — Z79899 Other long term (current) drug therapy: Secondary | ICD-10-CM | POA: Diagnosis not present

## 2015-11-18 DIAGNOSIS — Z515 Encounter for palliative care: Secondary | ICD-10-CM | POA: Diagnosis not present

## 2015-11-18 DIAGNOSIS — D509 Iron deficiency anemia, unspecified: Secondary | ICD-10-CM | POA: Diagnosis not present

## 2015-11-18 DIAGNOSIS — L02415 Cutaneous abscess of right lower limb: Secondary | ICD-10-CM | POA: Diagnosis present

## 2015-11-18 DIAGNOSIS — Z7982 Long term (current) use of aspirin: Secondary | ICD-10-CM | POA: Diagnosis not present

## 2015-11-18 DIAGNOSIS — E119 Type 2 diabetes mellitus without complications: Secondary | ICD-10-CM | POA: Diagnosis present

## 2015-11-18 DIAGNOSIS — C7931 Secondary malignant neoplasm of brain: Secondary | ICD-10-CM | POA: Diagnosis not present

## 2015-11-18 DIAGNOSIS — Z87891 Personal history of nicotine dependence: Secondary | ICD-10-CM | POA: Diagnosis not present

## 2015-11-18 DIAGNOSIS — K219 Gastro-esophageal reflux disease without esophagitis: Secondary | ICD-10-CM | POA: Diagnosis present

## 2015-11-18 DIAGNOSIS — M109 Gout, unspecified: Secondary | ICD-10-CM | POA: Diagnosis present

## 2015-11-18 DIAGNOSIS — K922 Gastrointestinal hemorrhage, unspecified: Secondary | ICD-10-CM | POA: Diagnosis present

## 2015-11-18 DIAGNOSIS — Z66 Do not resuscitate: Secondary | ICD-10-CM | POA: Diagnosis not present

## 2015-11-18 DIAGNOSIS — C3490 Malignant neoplasm of unspecified part of unspecified bronchus or lung: Secondary | ICD-10-CM | POA: Diagnosis present

## 2015-11-18 DIAGNOSIS — Z8546 Personal history of malignant neoplasm of prostate: Secondary | ICD-10-CM | POA: Diagnosis not present

## 2015-11-18 DIAGNOSIS — C3431 Malignant neoplasm of lower lobe, right bronchus or lung: Secondary | ICD-10-CM | POA: Diagnosis not present

## 2015-11-18 DIAGNOSIS — C787 Secondary malignant neoplasm of liver and intrahepatic bile duct: Secondary | ICD-10-CM | POA: Diagnosis not present

## 2015-11-18 DIAGNOSIS — I10 Essential (primary) hypertension: Secondary | ICD-10-CM | POA: Diagnosis present

## 2015-11-18 DIAGNOSIS — Z85118 Personal history of other malignant neoplasm of bronchus and lung: Secondary | ICD-10-CM | POA: Diagnosis not present

## 2015-11-18 LAB — BASIC METABOLIC PANEL
ANION GAP: 6 (ref 5–15)
BUN: 14 mg/dL (ref 6–20)
CALCIUM: 8.7 mg/dL — AB (ref 8.9–10.3)
CO2: 24 mmol/L (ref 22–32)
Chloride: 109 mmol/L (ref 101–111)
Creatinine, Ser: 0.87 mg/dL (ref 0.61–1.24)
GFR calc Af Amer: >60 mL/min (ref >60)
GFR calc non Af Amer: >60 mL/min (ref >60)
GLUCOSE: 91 mg/dL (ref 65–99)
Potassium: 4.5 mmol/L (ref 3.5–5.1)
Sodium: 139 mmol/L (ref 135–145)

## 2015-11-18 LAB — CBC
HEMATOCRIT: 33.5 % — AB (ref 40.0–52.0)
HEMOGLOBIN: 10.8 g/dL — AB (ref 13.0–18.0)
MCH: 24.6 pg — AB (ref 26.0–34.0)
MCHC: 32.3 g/dL (ref 32.0–36.0)
MCV: 76.2 fL — ABNORMAL LOW (ref 80.0–100.0)
Platelets: 53 10*3/uL — ABNORMAL LOW (ref 150–440)
RBC: 4.4 MIL/uL (ref 4.40–5.90)
RDW: 15.9 % — AB (ref 11.5–14.5)
WBC: 12.1 10*3/uL — ABNORMAL HIGH (ref 3.8–10.6)

## 2015-11-18 MED ORDER — SULFAMETHOXAZOLE-TRIMETHOPRIM 800-160 MG PO TABS
0.5000 | ORAL_TABLET | Freq: Two times a day (BID) | ORAL | Status: DC
  Administered 2015-11-18 – 2015-11-19 (×3): 0.5 via ORAL
  Filled 2015-11-18 (×2): qty 0.5
  Filled 2015-11-18 (×2): qty 1
  Filled 2015-11-18: qty 0.5

## 2015-11-18 MED ORDER — MORPHINE SULFATE (PF) 2 MG/ML IV SOLN
2.0000 mg | INTRAVENOUS | Status: DC | PRN
  Administered 2015-11-18: 14:00:00 2 mg via INTRAVENOUS
  Filled 2015-11-18: qty 1

## 2015-11-18 MED ORDER — HALOPERIDOL LACTATE 5 MG/ML IJ SOLN
2.0000 mg | Freq: Once | INTRAMUSCULAR | Status: AC
  Administered 2015-11-18: 2 mg via INTRAVENOUS
  Filled 2015-11-18: qty 1

## 2015-11-18 MED ORDER — ONDANSETRON HCL 4 MG PO TABS: 4.0000 mg | ORAL_TABLET | Freq: Four times a day (QID) | ORAL | Status: DC | PRN

## 2015-11-18 MED ORDER — ALBUTEROL SULFATE (2.5 MG/3ML) 0.083% IN NEBU: 3.0000 mL | INHALATION_SOLUTION | Freq: Four times a day (QID) | RESPIRATORY_TRACT | Status: DC | PRN

## 2015-11-18 MED ORDER — GUAIFENESIN ER 600 MG PO TB12
1200.0000 mg | ORAL_TABLET | Freq: Every day | ORAL | Status: DC
  Administered 2015-11-18 – 2015-11-23 (×6): 1200 mg via ORAL
  Filled 2015-11-18 (×6): qty 2

## 2015-11-18 MED ORDER — ONDANSETRON HCL 4 MG/2ML IJ SOLN: 4.0000 mg | Freq: Four times a day (QID) | INTRAMUSCULAR | Status: DC | PRN

## 2015-11-18 MED ORDER — COLCHICINE 0.6 MG PO TABS
0.6000 mg | ORAL_TABLET | Freq: Every day | ORAL | Status: DC
  Administered 2015-11-18 – 2015-11-23 (×6): 0.6 mg via ORAL
  Filled 2015-11-18 (×6): qty 1

## 2015-11-18 MED ORDER — FAMOTIDINE IN NACL 20-0.9 MG/50ML-% IV SOLN
20.0000 mg | Freq: Two times a day (BID) | INTRAVENOUS | Status: DC
  Administered 2015-11-18 (×2): 20 mg via INTRAVENOUS
  Filled 2015-11-18 (×2): qty 50

## 2015-11-18 MED ORDER — LOSARTAN POTASSIUM 25 MG PO TABS
25.0000 mg | ORAL_TABLET | Freq: Every day | ORAL | Status: DC
  Administered 2015-11-18 – 2015-11-23 (×6): 25 mg via ORAL
  Filled 2015-11-18 (×6): qty 1

## 2015-11-18 MED ORDER — FLUTICASONE PROPIONATE 50 MCG/ACT NA SUSP
2.0000 | Freq: Every day | NASAL | Status: DC
  Administered 2015-11-18 – 2015-11-23 (×6): 2 via NASAL
  Filled 2015-11-18: qty 16

## 2015-11-18 MED ORDER — FAMOTIDINE IN NACL 20-0.9 MG/50ML-% IV SOLN
INTRAVENOUS | Status: AC
  Filled 2015-11-18: qty 50

## 2015-11-18 MED ORDER — ACETAMINOPHEN 650 MG RE SUPP: 650.0000 mg | Freq: Four times a day (QID) | RECTAL | Status: DC | PRN

## 2015-11-18 MED ORDER — OXYCODONE-ACETAMINOPHEN 5-325 MG PO TABS
1.0000 | ORAL_TABLET | Freq: Once | ORAL | Status: AC
  Administered 2015-11-18: 1 via ORAL

## 2015-11-18 MED ORDER — OXYCODONE-ACETAMINOPHEN 5-325 MG PO TABS
ORAL_TABLET | ORAL | Status: AC
  Administered 2015-11-18: 1 via ORAL
  Filled 2015-11-18: qty 1

## 2015-11-18 MED ORDER — SODIUM CHLORIDE 0.9 % IV SOLN
INTRAVENOUS | Status: DC
  Administered 2015-11-18 (×3): via INTRAVENOUS

## 2015-11-18 MED ORDER — PREDNISONE 50 MG PO TABS: 50.0000 mg | ORAL_TABLET | Freq: Every day | ORAL | Status: DC

## 2015-11-18 MED ORDER — ACETAMINOPHEN 325 MG PO TABS
650.0000 mg | ORAL_TABLET | Freq: Four times a day (QID) | ORAL | Status: DC | PRN
  Administered 2015-11-19 – 2015-11-21 (×3): 650 mg via ORAL
  Filled 2015-11-18 (×3): qty 2

## 2015-11-18 MED ORDER — TAMSULOSIN HCL 0.4 MG PO CAPS
0.4000 mg | ORAL_CAPSULE | Freq: Every day | ORAL | Status: DC
  Administered 2015-11-18 – 2015-11-22 (×5): 0.4 mg via ORAL
  Filled 2015-11-18 (×5): qty 1

## 2015-11-18 NOTE — Progress Notes (Signed)
[facility]  Patient Status: Active  [facility] is providing the following services: SN/PT/OT/HHA  If patient discharges after hours, please call [phone].   [name] 11/18/2015, 8:20 AM

## 2015-11-18 NOTE — Care Management Important Message (Signed)
Important Message  Patient Details  Name: [name] MRN: [id number] Date of Birth: October 24, 1940   Medicare Important Message Given:  Yes    [name], RN 11/18/2015, 7:58 AM

## 2015-11-18 NOTE — Plan of Care (Signed)
Problem: Education: Goal: Knowledge of [facility] Education information/materials will improve Outcome: Progressing Given to wife also  Problem: Safety: Goal: Ability to remain free from injury will improve Outcome: Progressing No falls alarm on  Problem: Pain Managment: Goal: General experience of comfort will improve Outcome: Progressing Relief from gout  From pain med given  Problem: Skin Integrity: Goal: Risk for impaired skin integrity will decrease Outcome: Progressing No change  Problem: Activity: Goal: Risk for activity intolerance will decrease Outcome: Not Progressing bedrest

## 2015-11-18 NOTE — H&P (Signed)
[place] [facility] [place] NAME: [name]    MR#:  [id number]  DATE OF BIRTH:  01-Feb-1941  DATE OF ADMISSION:  11/17/2015  PRIMARY CARE PHYSICIAN: SPARKS,JEFFREY D, MD   REQUESTING/REFERRING PHYSICIAN:   CHIEF COMPLAINT:   Chief Complaint  Patient presents with  . Rectal Bleeding    HISTORY OF PRESENT ILLNESS: [name]  is a 75 y.o. 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 Patient had one episode of rectal bleed around 9:30 PM last night. It was frank blood according to patient's wife. No history of vomiting of blood or any coughing of blood. No complaints of any abdominal pain. No nausea or vomiting. Patient had similar complaints one week ago and he was evaluated for rectal bleed at [facility]. Patient did not have any procedures such as EGD or endoscopy done at [facility]. Today in the evaluation in the emergency room hemoglobin has been stable. No complaints of chest pain, shortness of breath. No headache dizziness and blurry vision. Hospitalist service was consulted for further care of the patient.  PAST MEDICAL HISTORY:   Past Medical History:  Diagnosis Date  . Arthritis   . GERD (gastroesophageal reflux disease)   . Hyperlipidemia   . Hypertension   . 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 Prostate cancer ([place]) 03/2013   had seed implant and radiation for 5 weeks  . Type 2 diabetes mellitus ([place])   . Wears glasses     PAST SURGICAL HISTORY: Past Surgical History:  Procedure Laterality Date  . COLONOSCOPY  2010  . CYSTOSCOPY N/A 07/04/2013   Procedure: CYSTOSCOPY FLEXIBLE;  Surgeon: Claybon Jabs, MD;  Location: [facility];  Service: Urology;  Laterality: N/A;  . INGUINAL HERNIA REPAIR Left 2005  . PROSTATE  BIOPSY    . RADIOACTIVE SEED IMPLANT N/A 07/04/2013   Procedure: RADIOACTIVE SEED IMPLANT;  Surgeon: Claybon Jabs, MD;  Location: [facility];  Service: Urology;  Laterality: N/A;  . REPAIR CHRONIC INCARCERATED RECURRENT LEFT INGUINAL HERNIA  10-23-2008  . TRANSURETHRAL RESECTION OF BLADDER TUMOR N/A 01/02/2015   Procedure: Cystoscopy clot evalucation and fulgration;  Surgeon: Kathie Rhodes, MD;  Location: [facility];  Service: Urology;  Laterality: N/A;    SOCIAL HISTORY:  Social History  Substance Use Topics  . Smoking status: Former Smoker    Packs/day: 1.00    Years: 30.00    Types: Cigarettes    Quit date: 02/01/2008  . Smokeless tobacco: Never Used  . Alcohol use No    FAMILY HISTORY:  Family History  Problem Relation Age of Onset  . Cancer Mother     ovarian  . Heart disease Father   . Cancer Sister     breast  . Breast cancer Sister   . Cancer Brother     lung  . Cancer Brother     prostate  . Prostate cancer Brother   . Breast cancer Sister   . Lung cancer Brother   . Lung cancer Brother     DRUG ALLERGIES: No Known Allergies  REVIEW OF SYSTEMS:   CONSTITUTIONAL: No fever, has weakness.  EYES: No blurred or double vision.  EARS, NOSE, AND THROAT: No tinnitus or ear pain.  RESPIRATORY: No cough, shortness of breath, wheezing or hemoptysis.  CARDIOVASCULAR:  No chest pain, orthopnea, edema.  GASTROINTESTINAL: No nausea, vomiting, diarrhea or abdominal pain. Has rectal bleed  GENITOURINARY: No dysuria, hematuria.  ENDOCRINE: No polyuria, nocturia,  HEMATOLOGY: Has rectal bleeding SKIN: small skin abscess right inner thigh area. MUSCULOSKELETAL: No joint pain or arthritis.   NEUROLOGIC: No tingling, numbness, weakness.  PSYCHIATRY: No anxiety or depression.   MEDICATIONS AT HOME:  Prior to Admission medications   Medication Sig Start Date End Date Taking? Authorizing Provider  aspirin EC 81 MG tablet Take 81 mg by mouth daily.   Yes Historical  Provider, MD  ferrous sulfate 325 (65 FE) MG tablet Take 1 tablet (325 mg total) by mouth 2 (two) times daily with a meal. 03/20/15  Yes Lavina Hamman, MD  fexofenadine (ALLEGRA) 180 MG tablet Take 180 mg by mouth daily.   Yes Historical Provider, MD  Fluticasone Propionate (FLONASE NA) Place 1 spray into the nose daily.    Yes Historical Provider, MD  furosemide (LASIX) 20 MG tablet Take 20 mg by mouth daily.   Yes Historical Provider, MD  guaiFENesin (MUCINEX) 600 MG 12 hr tablet Take 1,200 mg by mouth daily.    Yes Historical Provider, MD  insulin NPH Human (HUMULIN N,NOVOLIN N) 100 UNIT/ML injection Inject 30 Units into the skin 2 (two) times daily before a meal. Based on sliding scale    Yes Historical Provider, MD  losartan (COZAAR) 25 MG tablet Take 25 mg by mouth daily.  03/17/15  Yes Historical Provider, MD  metFORMIN (GLUCOPHAGE) 1000 MG tablet Take 1,000 mg by mouth 2 (two) times daily with a meal.  02/28/13  Yes Historical Provider, MD  montelukast (SINGULAIR) 10 MG tablet Take 1 tablet by mouth daily. 10/19/15  Yes Historical Provider, MD  omeprazole (PRILOSEC) 20 MG capsule Take 20 mg by mouth daily as needed (heartburn).    Yes Historical Provider, MD  potassium chloride (K-DUR) 10 MEQ tablet Take 1 tablet by mouth 2 (two) times daily. 10/18/15  Yes Historical Provider, MD  tamsulosin (FLOMAX) 0.4 MG CAPS capsule Take 1 capsule (0.4 mg total) by mouth daily after supper. 03/20/15  Yes Lavina Hamman, MD  VENTOLIN HFA 108 (90 Base) MCG/ACT inhaler Inhale 1 puff into the lungs every 6 (six) hours as needed for shortness of breath. 09/11/15  Yes Historical Provider, MD      PHYSICAL EXAMINATION:   VITAL SIGNS: Blood pressure 139/73, pulse 95, temperature 98.1 F (36.7 C), temperature source Oral, resp. rate 16, height 6' (1.829 m), weight 101.2 kg (223 lb), SpO2 96 %.  GENERAL:  75 y.o.-year-old patient lying in the bed with no acute distress.  EYES: Pupils equal, round, reactive to  light and accommodation. No scleral icterus. Extraocular muscles intact.  HEENT: Head atraumatic, normocephalic. Oropharynx and nasopharynx clear.  NECK:  Supple, no jugular venous distention. No thyroid enlargement, no tenderness.  LUNGS: Normal breath sounds bilaterally, no wheezing, rales,rhonchi or crepitation. No use of accessory muscles of respiration.  CARDIOVASCULAR: S1, S2 normal. No murmurs, rubs, or gallops.  ABDOMEN: Soft, nontender, nondistended. Bowel sounds present. No organomegaly or mass.  EXTREMITIES: No pedal edema, cyanosis, or clubbing.  NEUROLOGIC: Cranial nerves II through XII are intact. Muscle strength 5/5 in all extremities. Sensation intact. Gait not checked.  PSYCHIATRIC: The patient is alert and oriented x 3.  SKIN: small abscess inner right thigh area   LABORATORY PANEL:   CBC  Recent Labs Lab 11/17/15 1844  WBC 10.5  HGB 12.0*  HCT 36.8*  PLT 51*  MCV 75.5*  MCH 24.7*  MCHC 32.7  RDW 15.8*   ------------------------------------------------------------------------------------------------------------------  Chemistries   Recent Labs Lab 11/17/15 1844  NA 137  K 4.3  CL 105  CO2 22  GLUCOSE 128*  BUN 15  CREATININE 0.74  CALCIUM 9.1  AST 44*  ALT 38  ALKPHOS 203*  BILITOT 0.7   ------------------------------------------------------------------------------------------------------------------ estimated creatinine clearance is 96.7 mL/min (by C-G formula based on SCr of 0.74 mg/dL). ------------------------------------------------------------------------------------------------------------------ No results for input(s): TSH, T4TOTAL, T3FREE, THYROIDAB in the last 72 hours.  Invalid input(s): FREET3   Coagulation profile No results for input(s): INR, PROTIME in the last 168 hours. ------------------------------------------------------------------------------------------------------------------- No results for input(s): DDIMER in the  last 72 hours. -------------------------------------------------------------------------------------------------------------------  Cardiac Enzymes No results for input(s): CKMB, TROPONINI, MYOGLOBIN in the last 168 hours.  Invalid input(s): CK ------------------------------------------------------------------------------------------------------------------ Invalid input(s): POCBNP  ---------------------------------------------------------------------------------------------------------------  Urinalysis    Component Value Date/Time   COLORURINE AMBER (A) 03/17/2015 2027   APPEARANCEUR CLOUDY (A) 03/17/2015 2027   LABSPEC 1.018 03/17/2015 2027   PHURINE 5.5 03/17/2015 2027   GLUCOSEU >1000 (A) 03/17/2015 2027   HGBUR LARGE (A) 03/17/2015 2027   BILIRUBINUR NEGATIVE 03/17/2015 2027   KETONESUR NEGATIVE 03/17/2015 2027   PROTEINUR 100 (A) 03/17/2015 2027   NITRITE NEGATIVE 03/17/2015 2027   LEUKOCYTESUR SMALL (A) 03/17/2015 2027     RADIOLOGY: No results found.  EKG: Orders placed or performed during the hospital encounter of 11/09/15  . ED EKG  . 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 Admitting diagnosis 1. Acute gastrointestinal bleeding 2. Superficial skin abscess right thigh 3. Prostate cancer 4. 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 All the records are reviewed and case discussed  with ED provider. Management plans discussed with the patient, family and they are in agreement.  CODE STATUS:DNR    Code Status Orders        Start     Ordered   11/18/15 0122  Do not attempt resuscitation (DNR)  Continuous    Question Answer Comment  In the event of cardiac or respiratory ARREST Do not call a "code blue"   In the event of cardiac or respiratory ARREST Do not perform Intubation, CPR, defibrillation or ACLS   In the event of cardiac or respiratory ARREST Use medication by any route, position, wound care, and other measures to relive pain and suffering. May use oxygen, suction and manual treatment of airway obstruction as needed for comfort.      11/18/15 0121    Code Status History    Date Active Date Inactive Code Status Order ID Comments User Context   03/17/2015  9:34 PM 03/20/2015  9:43 PM DNR [id number]  Vianne Bulls, MD ED   12/28/2014  8:58 PM 01/04/2015  8:21 PM DNR [id number]  Gennaro Africa, MD Inpatient   12/28/2014  7:59 PM 12/28/2014  8:58 PM Full Code [id number]  Gennaro Africa, MD Inpatient       TOTAL TIME TAKING CARE OF THIS PATIENT: 52 minutes.    Pavan Pyreddy  M.D on 11/18/2015 at 1:32 AM  Between 7am to 6pm - Pager - [phone]  After 6pm go to [email or web address] - password [email or web address] [facility]  Office  [phone]  CC: Primary care physician; Idelle Crouch, MD

## 2015-11-18 NOTE — Care Management (Signed)
Admitted to [facility] with the diagnosis of GI Bleed. Lives with wife, Jan [phone]). Last seen Dr. Doy Hutching within this year. A patient at [facility] last February for "an infection." [place] in the past 6 months. No skilled nursing. No home oxygen. Cane and rolling walker to aid in ambulation. No falls. Good appetite. Prescriptions are filled at [facility] on [address]. Takes care of all basic activities of daily living himself, drives. Retired from [employer]. Wife will transport [name] RN MSN [place] [facility] [phone]

## 2015-11-18 NOTE — Consult Note (Signed)
GI Inpatient Consult Note  Reason for Consult: GI bleed   Attending Requesting Consult: Dr. Estanislado Pandy  History of Present Illness: [name] is a 75 y.o. 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 Review of records in patient's EMR indicate he was evaluated at the [facility] ED and transferred to [facility] for management of lower GI bleeding on 11/09/15.  Hgb decreased from 13.0 to 11.2, then remained stable around 12. GI evaluation was obtained, and diverticular bleed was suspected as bleeding has subsided.  Colonoscopy was deferred due to stability and overall poor functional status. Patient was stable for d/c on 11/11/15, but remained admitted until 11/13/15 as home health supportive measures (chair lift, medical bed, etc) were arranged.   Patient returned to the [facility] ED following a recurrence of rectal bleeding.  He reports one episode of BRBPR around 9pm.  Since admission, he has not experienced any further blood per rectum.  IV Pepcid was initiated on admission, but discontinued due to cessation of bleeding.  His last colonoscopy in 2008 was notable for mild diverticulosis throughout the colon.  The exam was incomplete due to displacement of the transverse colon into a large inguinal hernia.  He denies a FHx of CRC, colon polyps, or other GI malignancy.  Review of prior weight recordings indicate about 20 lb weight loss since January.    Today, patient adamantly denies GI concerns.  He specifically denies abdominal pain or blood per rectum.  He only complains of left foot pain, which he attributes to gout.  He does not feel that he needs to be followed by a gastroenterologist.  Appetite is fair.  No dysphagia, heartburn, acid reflux, nausea, vomiting, change in bowel habits, or melena.    Of note, a history of chronic IDA is followed by Dr. Rogue Bussing.   Patient currently takes ferrous sulfate '325mg'$  BID at home for supplementation.  Past Medical History:  Past Medical History:  Diagnosis Date  . Arthritis   . Blood transfusion without reported diagnosis    December 28, 2014 8 units   . Cancer ([place])    liver   . GERD (gastroesophageal reflux disease)   . Gout   . History of blood transfusion    Februrary 2017 4units   . Hyperlipidemia   . Hypertension   . Lung cancer ([place])   . 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 Prostate cancer ([place]) 03/2013   had seed implant and radiation for 5 weeks  . Type 2 diabetes mellitus ([place])   . Wears glasses     Problem List: Patient Active Problem List   Diagnosis Date Noted  . GI bleed 11/18/2015  . 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 Generalized weakness 10/30/2015  . Metastases to the liver ([place]) 10/20/2015  . Multiple lung nodules 10/20/2015  . Iron deficiency anemia due to chronic blood loss 10/20/2015  . Prostate cancer ([place]) 10/20/2015  . Blood loss anemia 03/17/2015  . Acute blood loss anemia 12/28/2014  . Hematuria, gross 12/28/2014  . Pneumonia 12/28/2014  . Type 2 diabetes mellitus ([place]) 03/10/2013  . hypercholesterolemia 03/10/2013  . hypertension 03/10/2013  . 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 INGUINAL HERNIA 08/15/2008    Past Surgical History: Past Surgical History:  Procedure Laterality Date  . COLONOSCOPY  2010  . CYSTOSCOPY N/A 07/04/2013   Procedure: CYSTOSCOPY FLEXIBLE;  Surgeon: Claybon Jabs, MD;  Location: [facility];  Service: Urology;  Laterality: N/A;  . INGUINAL HERNIA REPAIR Left 2005  . LIVER BIOPSY Right 11/02/2015  . PROSTATE BIOPSY    . RADIOACTIVE SEED IMPLANT N/A 07/04/2013   Procedure: RADIOACTIVE SEED IMPLANT;  Surgeon: Claybon Jabs, MD;  [facility];  Service: Urology;  Laterality: N/A;  . REPAIR  CHRONIC INCARCERATED RECURRENT LEFT INGUINAL HERNIA  10-23-2008  . TRANSURETHRAL RESECTION OF BLADDER TUMOR N/A 01/02/2015   Procedure: Cystoscopy clot evalucation and fulgration;  Surgeon: Kathie Rhodes, MD;  [facility];  Service: Urology;  Laterality: N/A;    Allergies: No Known Allergies  Home Medications: Prescriptions Prior to Admission  Medication Sig Dispense Refill Last Dose  . aspirin EC 81 MG tablet Take 81 mg by mouth daily.   11/17/2015 at Unknown time  . ferrous sulfate 325 (65 FE) MG tablet Take 1 tablet (325 mg total) by mouth 2 (two) times daily with a meal. 60 tablet 0 11/17/2015 at Unknown time  . fexofenadine (ALLEGRA) 180 MG tablet Take 180 mg by mouth daily.   11/17/2015 at Unknown time  . Fluticasone Propionate (FLONASE NA) Place 1 spray into the nose daily.    11/17/2015 at Unknown time  . furosemide (LASIX) 20 MG tablet Take 20 mg by mouth daily.   11/17/2015 at Unknown time  . guaiFENesin (MUCINEX) 600 MG 12 hr tablet Take 1,200 mg by mouth daily.    11/17/2015 at Unknown time  . insulin NPH Human (HUMULIN N,NOVOLIN N) 100 UNIT/ML injection Inject 30 Units into the skin 2 (two) times daily before a meal. Based on sliding scale    11/17/2015 at Unknown time  . losartan (COZAAR) 25 MG tablet Take 25 mg by mouth daily.    11/17/2015 at Unknown time  . metFORMIN (GLUCOPHAGE) 1000 MG tablet Take 1,000 mg by mouth 2 (two) times daily with a meal.    11/17/2015 at Unknown time  . montelukast (SINGULAIR) 10 MG tablet Take 1 tablet by mouth at bedtime.    11/17/2015 at Unknown time  . omeprazole (PRILOSEC) 20 MG capsule Take 20 mg by mouth daily as needed (heartburn).    11/17/2015 at Unknown time  . potassium chloride (K-DUR) 10 MEQ tablet Take 1 tablet by mouth 2 (two) times daily.   11/17/2015 at Unknown time  . tamsulosin (FLOMAX) 0.4 MG CAPS capsule Take 1 capsule (0.4 mg total) by mouth daily after supper. (Patient taking differently: Take 0.8 mg by mouth daily after  supper. ) 30 capsule 0 11/17/2015 at Unknown time  . VENTOLIN HFA 108 (90 Base) MCG/ACT inhaler Inhale 1 puff into the lungs every 6 (six) hours as needed for shortness of breath.   prn   Home medication reconciliation was completed with the patient.   Scheduled Inpatient Medications:   . colchicine  0.6 mg Oral Daily  . fluticasone  2 spray Each Nare Daily  . guaiFENesin  1,200 mg Oral Daily  . losartan  25 mg Oral Daily  . sulfamethoxazole-trimethoprim  0.5 tablet Oral Q12H  . tamsulosin  0.4 mg Oral QPC supper    Continuous Inpatient Infusions:   . sodium chloride 75 mL/hr at 11/18/15 1030    PRN Inpatient Medications:  acetaminophen **OR** acetaminophen, albuterol, morphine injection, ondansetron **OR** ondansetron (ZOFRAN) IV  Family History: family  history includes Breast cancer in his sister and sister; Cancer in his brother, brother, mother, and sister; Heart disease in his father; Lung cancer in his brother and brother; Prostate cancer in his brother.   Social History:   reports that he quit smoking about 7 years ago. His smoking use included Cigarettes. He has a 30.00 pack-year smoking history. He has never used smokeless tobacco. He reports that he does not drink alcohol or use drugs.   Review of Systems: Constitutional: Weight is stable.  Eyes: No changes in vision. ENT: No oral lesions, sore throat.  GI: see HPI.  Heme/Lymph: No easy bruising.  CV: No chest pain.  GU: No hematuria.  Integumentary: No rashes.  Neuro: No headaches.  Psych: No depression/anxiety.  Endocrine: No heat/cold intolerance.  Allergic/Immunologic: No urticaria.  Resp: No cough, SOB.  Musculoskeletal: No joint swelling.    Physical Examination: BP (!) 167/70 (BP Location: Right Arm)   Pulse (!) 106   Temp 97.6 F (36.4 C) (Oral)   Resp 18   Ht 6' (1.829 m)   Wt 101.2 kg (223 lb)   SpO2 98%   BMI 30.24 kg/m  Gen: NAD, alert and oriented x 4 HEENT: PEERLA, EOMI, Neck: supple,  no JVD or thyromegaly Chest: CTA bilaterally, no wheezes, crackles, or other adventitious sounds CV: RRR, no m/g/c/r Abd: soft, NT, ND, +BS in all four quadrants; no HSM, guarding, ridigity, or rebound tenderness Ext: no edema, well perfused with 2+ pulses, Skin: no rash or lesions noted Lymph: no LAD  Data: Lab Results  Component Value Date   WBC 12.1 (H) 11/18/2015   HGB 10.8 (L) 11/18/2015   HCT 33.5 (L) 11/18/2015   MCV 76.2 (L) 11/18/2015   PLT 53 (L) 11/18/2015    Recent Labs Lab 11/17/15 1844 11/18/15 0534  HGB 12.0* 10.8*   Lab Results  Component Value Date   NA 139 11/18/2015   K 4.5 11/18/2015   CL 109 11/18/2015   CO2 24 11/18/2015   BUN 14 11/18/2015   CREATININE 0.87 11/18/2015   Lab Results  Component Value Date   ALT 38 11/17/2015   AST 44 (H) 11/17/2015   ALKPHOS 203 (H) 11/17/2015   BILITOT 0.7 11/17/2015   No results for input(s): APTT, INR, PTT in the last 168 hours.   Assessment/Plan: [name] is a 75 y.o. 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  He was admitted at [facility] about on 11/10/15 for the same, and bleeding was thought to be diverticular in nature.  Since admission, vital signs and Hgb have remained stable.  Patient denies further episodes of bleeding overnight, and denies any current GI complaints.  He states he does not require gastroenterology services and would not wish to proceed with a colonoscopy, if recommended.  Recommendations: - No inpatient recommendations as patient declines GI services - Bleed possibly diverticular in nature again - would only be able to confirm diverticulum and exclude other etiologies (radiation proctitis w/ h/o prostate XRT, inflammation, malignancy) with colonoscopy.  - Monitor Hgb, transfuse if <7 - Re-consult as needed  Thank you for the consult.  Please call with questions or concerns.  Lavera Guise, PA-C [facility] Phone: [phone] Pager: [phone]

## 2015-11-18 NOTE — Progress Notes (Signed)
[facility] [place] NAME: [name]    MRN#:  [id number]  DATE OF BIRTH:  1940/06/25  SUBJECTIVE:  Hospital Day: 0 days [name] is a 75 y.o. male presenting with Rectal Bleeding .   Overnight events: No acute overnight events Interval Events: No further episodes of bleeding  REVIEW OF SYSTEMS:  CONSTITUTIONAL: No fever, fatigue or weakness.  EYES: No blurred or double vision.  EARS, NOSE, AND THROAT: No tinnitus or ear pain.  RESPIRATORY: No cough, shortness of breath, wheezing or hemoptysis.  CARDIOVASCULAR: No chest pain, orthopnea, edema.  GASTROINTESTINAL: No nausea, vomiting, diarrhea or abdominal pain.  GENITOURINARY: No dysuria, hematuria.  ENDOCRINE: No polyuria, nocturia,  HEMATOLOGY: No anemia, easy bruising or bleeding SKIN: No rash or lesion. MUSCULOSKELETAL: No joint pain or arthritis.   NEUROLOGIC: No tingling, numbness, weakness.  PSYCHIATRY: No anxiety or depression.   DRUG ALLERGIES:  No Known Allergies  VITALS:  Blood pressure (!) 167/70, pulse (!) 106, temperature 97.6 F (36.4 C), temperature source Oral, resp. rate 18, height 6' (1.829 m), weight 101.2 kg (223 lb), SpO2 98 %.  PHYSICAL EXAMINATION:  VITAL SIGNS: Vitals:   11/18/15 1155 11/18/15 1312  BP: 131/69 (!) 167/70  Pulse: 88 (!) 106  Resp: 17 18  Temp: 97.9 F (36.6 C) 97.6 F (36.4 C)   GENERAL:75 y.o.male currently in no acute distress.  HEAD: Normocephalic, atraumatic.  EYES: Pupils equal, round, reactive to light. Extraocular muscles intact. No scleral icterus.  MOUTH: Moist mucosal membrane. Dentition intact. No abscess noted.  EAR, NOSE, THROAT: Clear without exudates. No external lesions.  NECK: Supple. No thyromegaly. No nodules. No JVD.  PULMONARY: Clear to ascultation, without wheeze rails or rhonci. No use of accessory muscles, Good respiratory effort. good air entry bilaterally CHEST: Nontender to  palpation.  CARDIOVASCULAR: S1 and S2. Regular rate and rhythm. No murmurs, rubs, or gallops. No edema. Pedal pulses 2+ bilaterally.  GASTROINTESTINAL: Soft, nontender, nondistended. No masses. Positive bowel sounds. No hepatosplenomegaly.  MUSCULOSKELETAL: No swelling, clubbing, or edema. Range of motion full in all extremities.  NEUROLOGIC: Cranial nerves II through XII are intact. No gross focal neurological deficits. Sensation intact. Reflexes intact.  SKIN: No ulceration, lesions, rashes, or cyanosis. Skin warm and dry. Turgor intact.  PSYCHIATRIC: Mood, affect within normal limits. The patient is awake, alert and oriented x 3. Insight, judgment intact.      LABORATORY PANEL:   CBC  Recent Labs Lab 11/18/15 0534  WBC 12.1*  HGB 10.8*  HCT 33.5*  PLT 53*   ------------------------------------------------------------------------------------------------------------------  Chemistries   Recent Labs Lab 11/17/15 1844 11/18/15 0534  NA 137 139  K 4.3 4.5  CL 105 109  CO2 22 24  GLUCOSE 128* 91  BUN 15 14  CREATININE 0.74 0.87  CALCIUM 9.1 8.7*  AST 44*  --   ALT 38  --   ALKPHOS 203*  --   BILITOT 0.7  --    ------------------------------------------------------------------------------------------------------------------  Cardiac Enzymes No results for input(s): TROPONINI in the last 168 hours. ------------------------------------------------------------------------------------------------------------------  RADIOLOGY:  No results found.  EKG:   Orders placed or performed during the hospital encounter of 11/09/15  . ED EKG  . ED EKG    ASSESSMENT AND PLAN:   [name] is a 75 y.o. male presenting with Rectal Bleeding . Admitted 11/17/2015 : Day #: 0 days 1. GI bleed: Can stop Pepcid, GI consult pending, continue clear liquid diet and follow  CBC 2. Right thigh abscess continue antibiotics 3. Neuroendocrine carcinoma with known metastatic  disease   All the records are reviewed and case discussed with Care Management/Social Workerr. Management plans discussed with the patient, family and they are in agreement.  CODE STATUS: dnr TOTAL TIME TAKING CARE OF THIS PATIENT: 28 minutes.   POSSIBLE D/C IN 1-2DAYS, DEPENDING ON CLINICAL CONDITION.   Hower,  Karenann Cai.D on 11/18/2015 at 1:21 PM  Between 7am to 6pm - Pager - [phone]  After 6pm: House Pager: - [facility]  Office  [phone]  CC: Primary care physician; Idelle Crouch, MD

## 2015-11-19 ENCOUNTER — Telehealth (INDEPENDENT_AMBULATORY_CARE_PROVIDER_SITE_OTHER): Payer: Self-pay

## 2015-11-19 ENCOUNTER — Inpatient Hospital Stay: Payer: Medicare Other

## 2015-11-19 DIAGNOSIS — N400 Enlarged prostate without lower urinary tract symptoms: Secondary | ICD-10-CM

## 2015-11-19 DIAGNOSIS — Z515 Encounter for palliative care: Secondary | ICD-10-CM

## 2015-11-19 DIAGNOSIS — M1990 Unspecified osteoarthritis, unspecified site: Secondary | ICD-10-CM

## 2015-11-19 DIAGNOSIS — I2510 Atherosclerotic heart disease of native coronary artery without angina pectoris: Secondary | ICD-10-CM

## 2015-11-19 DIAGNOSIS — Z87891 Personal history of nicotine dependence: Secondary | ICD-10-CM

## 2015-11-19 DIAGNOSIS — I10 Essential (primary) hypertension: Secondary | ICD-10-CM

## 2015-11-19 DIAGNOSIS — I700 Atherosclerosis of aorta: Secondary | ICD-10-CM

## 2015-11-19 DIAGNOSIS — D509 Iron deficiency anemia, unspecified: Secondary | ICD-10-CM

## 2015-11-19 DIAGNOSIS — E119 Type 2 diabetes mellitus without complications: Secondary | ICD-10-CM

## 2015-11-19 DIAGNOSIS — Z7984 Long term (current) use of oral hypoglycemic drugs: Secondary | ICD-10-CM

## 2015-11-19 DIAGNOSIS — Z8546 Personal history of malignant neoplasm of prostate: Secondary | ICD-10-CM

## 2015-11-19 DIAGNOSIS — Z66 Do not resuscitate: Secondary | ICD-10-CM

## 2015-11-19 DIAGNOSIS — C787 Secondary malignant neoplasm of liver and intrahepatic bile duct: Secondary | ICD-10-CM

## 2015-11-19 DIAGNOSIS — C7931 Secondary malignant neoplasm of brain: Secondary | ICD-10-CM

## 2015-11-19 DIAGNOSIS — E785 Hyperlipidemia, unspecified: Secondary | ICD-10-CM

## 2015-11-19 DIAGNOSIS — K219 Gastro-esophageal reflux disease without esophagitis: Secondary | ICD-10-CM

## 2015-11-19 DIAGNOSIS — K922 Gastrointestinal hemorrhage, unspecified: Secondary | ICD-10-CM

## 2015-11-19 DIAGNOSIS — C3431 Malignant neoplasm of lower lobe, right bronchus or lung: Secondary | ICD-10-CM

## 2015-11-19 LAB — CBC
HCT: 33.3 % — ABNORMAL LOW (ref 40.0–52.0)
HEMOGLOBIN: 11 g/dL — AB (ref 13.0–18.0)
MCH: 24.5 pg — ABNORMAL LOW (ref 26.0–34.0)
MCHC: 33.2 g/dL (ref 32.0–36.0)
MCV: 73.8 fL — ABNORMAL LOW (ref 80.0–100.0)
Platelets: 78 10*3/uL — ABNORMAL LOW (ref 150–440)
RBC: 4.51 MIL/uL (ref 4.40–5.90)
RDW: 15.9 % — ABNORMAL HIGH (ref 11.5–14.5)
WBC: 15 10*3/uL — ABNORMAL HIGH (ref 3.8–10.6)

## 2015-11-19 LAB — GLUCOSE, CAPILLARY
Glucose-Capillary: 195 mg/dL — ABNORMAL HIGH (ref 65–99)
Glucose-Capillary: 267 mg/dL — ABNORMAL HIGH (ref 65–99)

## 2015-11-19 MED ORDER — DEXAMETHASONE SODIUM PHOSPHATE 10 MG/ML IJ SOLN: 10.0000 mg | Freq: Three times a day (TID) | INTRAMUSCULAR | Status: DC

## 2015-11-19 MED ORDER — METOPROLOL SUCCINATE ER 25 MG PO TB24
25.0000 mg | ORAL_TABLET | Freq: Every day | ORAL | Status: DC
  Administered 2015-11-19 – 2015-11-23 (×5): 25 mg via ORAL
  Filled 2015-11-19 (×5): qty 1

## 2015-11-19 MED ORDER — DEXAMETHASONE 4 MG PO TABS
10.0000 mg | ORAL_TABLET | Freq: Three times a day (TID) | ORAL | Status: DC
  Administered 2015-11-19 – 2015-11-22 (×9): 10 mg via ORAL
  Filled 2015-11-19 (×6): qty 3
  Filled 2015-11-19: qty 1
  Filled 2015-11-19: qty 3
  Filled 2015-11-19: qty 2
  Filled 2015-11-19: qty 3

## 2015-11-19 MED ORDER — GADOBENATE DIMEGLUMINE 529 MG/ML IV SOLN
20.0000 mL | Freq: Once | INTRAVENOUS | Status: AC | PRN
  Administered 2015-11-19: 12:00:00 20 mL via INTRAVENOUS

## 2015-11-19 MED ORDER — INSULIN ASPART 100 UNIT/ML ~~LOC~~ SOLN
0.0000 [IU] | Freq: Three times a day (TID) | SUBCUTANEOUS | Status: DC
  Administered 2015-11-19: 2 [IU] via SUBCUTANEOUS
  Administered 2015-11-20: 5 [IU] via SUBCUTANEOUS
  Administered 2015-11-20: 2 [IU] via SUBCUTANEOUS
  Administered 2015-11-20 – 2015-11-21 (×2): 5 [IU] via SUBCUTANEOUS
  Administered 2015-11-21: 08:00:00 2 [IU] via SUBCUTANEOUS
  Administered 2015-11-21: 17:00:00 3 [IU] via SUBCUTANEOUS
  Administered 2015-11-22: 2 [IU] via SUBCUTANEOUS
  Administered 2015-11-22 (×2): 5 [IU] via SUBCUTANEOUS
  Administered 2015-11-23 (×2): 2 [IU] via SUBCUTANEOUS
  Filled 2015-11-19: qty 2
  Filled 2015-11-19: qty 5
  Filled 2015-11-19: qty 3
  Filled 2015-11-19: qty 5
  Filled 2015-11-19 (×3): qty 2
  Filled 2015-11-19: qty 3
  Filled 2015-11-19: qty 5
  Filled 2015-11-19: qty 2
  Filled 2015-11-19 (×2): qty 5

## 2015-11-19 NOTE — Care Management Note (Signed)
Case Management Note  Patient Details  Name: [name] MRN: [id number] Date of Birth: July 06, 1940  Subjective/Objective:                   PCP Dr. Fulton Reek. Lives with wife. He states he no longer drives and has "trouble with legs". He states they "do not want to do what I want them to do". He has used home health in the past- he doesn't have a preference. [facility]. He uses [facility] on [address] He states that he is homebound. He states he has a rolling walker available for use. He states he is able to ride in a car.  Action/Plan:   Referral made to [facility].   Expected Discharge Date:                  Expected Discharge Plan:     In-House Referral:     Discharge planning Services  CM Consult  Post Acute Care Choice:  Home Health Choice offered to:  Patient  DME Arranged:    DME Agency:     HH Arranged:    [place] Agency:     Status of Service:  In process, will continue to follow  If discussed at Long Length of Stay Meetings, dates discussed:    Additional Comments:  Marshell Garfinkel, RN 11/19/2015, 11:14 AM

## 2015-11-19 NOTE — Telephone Encounter (Signed)
Patients wife called and left a message for a return call. I called her back and she was stating that her husband was canceled for his port-a-cath due to him being in the hospital at [facility]. Now he is in the hospital at [facility] for rectal bleeding, and she is concerned that him having a port placed now will be a problem because he has sundowners. She states he pulls out his IV and she thinks he will do the same to the port. She stated that she will be talking with his doctor because she feels having a port placed now is not necessary due to the type of cancer he has and she is concerned about his quality of life. She will call me if she decide to have his port placed.

## 2015-11-19 NOTE — Consult Note (Signed)
Patient with "Sundown" type behavior, poulled IV out twice during the night.Incontinent of bowel and bladder.  No bleeding since admission, on clear liq diet.  Recommend advance to full liquid diet, verbal order given to nurse. If hgb stays stable no need for EGD. Will follow with you.

## 2015-11-19 NOTE — Consult Note (Addendum)
Consultation Note Date: 11/19/2015   Patient Name: [name]  DOB: [birth year]  MRN: [id number]  Age / Sex: 75 y.o., male  PCP: Jeremiah Crouch, MD Referring Physician: Hillary Bow, MD  Reason for Consultation: Establishing goals of care and Psychosocial/spiritual support  HPI/Patient Profile: 75 y.o. 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   Patient had one episode of rectal bleed around 9:30 PM last night. It was frank blood according to patient's wife. No history of vomiting of blood or any coughing of blood. No complaints of any abdominal pain. No nausea or vomiting.   Patient had similar complaints one week ago and he was evaluated for rectal bleed at [facility]. Patient did not have any procedures such as EGD or endoscopy done at [facility].   Today in the evaluation in the emergency room hemoglobin has been stable. No complaints of chest pain, shortness of breath. No headache dizziness and blurry vision.  Today head CT shows progression of disease-- Head CT 11-19-15  IMPRESSION: 1. 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 Mild associated edema without significant mass effect. 2. 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  Patient does not have decisional capacity   Wife needs to be called for all discussions and decisions made for the care of this patient.   Family is faced with treatment options decisions, advanced directive decisions and anticipatory care needs.  Wife is very concerned with home care needs.   Clinical Assessment and Goals of Care:  This NP [name] reviewed medical records, received report from team,  assessed the patient and then meet at the patient's bedside along with his wife/[name]/main support person  to discuss diagnosis, prognosis, GOC, EOL wishes disposition and options.  A detailed discussion was had today regarding advanced directives.  Concepts specific to code status, artifical feeding and hydration, continued IV antibiotics and rehospitalization was had.  The difference between a aggressive medical intervention path  and a palliative comfort care path for this patient at this time was had.  Values and goals of care important to patient and family were attempted to be elicited.  MOST form introduced  Concept of Hospice and Palliative Care were detailed  Natural trajectory and expectations at EOL were discussed.   Questions and concerns addressed.   Family encouraged to call with questions or concerns.    PMT will f/u on Monday if still IP and wife is educated on how to get in touch with PMT     SUMMARY OF RECOMMENDATIONS    Code Status/Advance Care Planning:  DNR  Most of today's conversation was around the decision for continued life prolonging interventions vs a more comfort approach to care; specific to continued cancer treatments, artifical hydration, re-hospitalizations.  Wife is focused on quality not quantity   Mrs  [name] needs to discuss medical treatment plan with oncology before making any decisions prior to discarge.   Psycho-social/Spiritual:    Additional Recommendations: information on hospice benefit in the home vs hospice facility   Discharge Planning: to be determined, left message with [name] for need to f/u with wife in the morning, she is struggling with home care needs and options.  She is considering placement of some sort if that is an option.     Primary Diagnoses: Present on Admission: . GI bleed   I have reviewed the medical record, interviewed the patient and family, and examined the patient. The following aspects are  pertinent.  Past Medical History:  Diagnosis Date  . Arthritis   . Blood transfusion without reported diagnosis    December 28, 2014 8 units   . Cancer ([place])    liver   . GERD (gastroesophageal reflux disease)   . Gout   . History of blood transfusion    Februrary 2017 4units   . Hyperlipidemia   . Hypertension   . Lung cancer ([place])   . 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 Prostate cancer ([place]) 03/2013   had seed implant and radiation for 5 weeks  . Type 2 diabetes mellitus ([place])   . Wears glasses    Social History   Social History  . Marital status: Married    Spouse name: N/A  . Number of children: N/A  . Years of education: N/A   Occupational History  . retired    Social History Main Topics  . Smoking status: Former Smoker    Packs/day: 1.00    Years: 30.00    Types: Cigarettes    Quit date: 02/01/2008  . Smokeless tobacco: Never Used  . Alcohol use No  . Drug use: No  . Sexual activity: Not Asked   Other Topics Concern  . None   Social History Narrative  . None   Family History  Problem Relation Age of Onset  . Cancer Mother     ovarian  . Heart disease Father   . Cancer Sister     breast  . Breast cancer Sister   . Cancer Brother     lung  . Cancer Brother     prostate  . Prostate cancer Brother   . Breast cancer Sister   . Lung cancer Brother   . Lung cancer Brother    Scheduled Meds: . colchicine  0.6 mg Oral Daily  . fluticasone  2 spray Each Nare Daily  . guaiFENesin  1,200 mg Oral Daily  . losartan  25 mg Oral Daily  . sulfamethoxazole-trimethoprim  0.5 tablet Oral Q12H  . tamsulosin  0.4 mg Oral QPC supper   Continuous Infusions:  PRN Meds:.acetaminophen **OR** acetaminophen, albuterol, morphine injection, ondansetron **OR** ondansetron (ZOFRAN) IV Medications Prior to Admission:  Prior to Admission medications   Medication Sig Start Date End Date Taking? Authorizing Provider  aspirin EC 81 MG tablet Take 81 mg  by mouth daily.   Yes Historical Provider, MD  ferrous sulfate 325 (65 FE) MG tablet Take 1 tablet (325 mg total) by mouth 2 (two) times daily with a meal. 03/20/15  Yes Jeremiah Hamman, MD  fexofenadine (ALLEGRA) 180 MG tablet Take 180 mg by mouth daily.   Yes Historical Provider, MD  Fluticasone Propionate (FLONASE NA) Place 1 spray into the nose daily.    Yes Historical Provider, MD  furosemide (LASIX) 20 MG tablet Take 20  mg by mouth daily.   Yes Historical Provider, MD  guaiFENesin (MUCINEX) 600 MG 12 hr tablet Take 1,200 mg by mouth daily.    Yes Historical Provider, MD  insulin NPH Human (HUMULIN N,NOVOLIN N) 100 UNIT/ML injection Inject 30 Units into the skin 2 (two) times daily before a meal. Based on sliding scale    Yes Historical Provider, MD  losartan (COZAAR) 25 MG tablet Take 25 mg by mouth daily.  03/17/15  Yes Historical Provider, MD  metFORMIN (GLUCOPHAGE) 1000 MG tablet Take 1,000 mg by mouth 2 (two) times daily with a meal.  02/28/13  Yes Historical Provider, MD  montelukast (SINGULAIR) 10 MG tablet Take 1 tablet by mouth at bedtime.  10/19/15  Yes Historical Provider, MD  omeprazole (PRILOSEC) 20 MG capsule Take 20 mg by mouth daily as needed (heartburn).    Yes Historical Provider, MD  potassium chloride (K-DUR) 10 MEQ tablet Take 1 tablet by mouth 2 (two) times daily. 10/18/15  Yes Historical Provider, MD  tamsulosin (FLOMAX) 0.4 MG CAPS capsule Take 1 capsule (0.4 mg total) by mouth daily after supper. Patient taking differently: Take 0.8 mg by mouth daily after supper.  03/20/15  Yes Jeremiah Hamman, MD  VENTOLIN HFA 108 (90 Base) MCG/ACT inhaler Inhale 1 puff into the lungs every 6 (six) hours as needed for shortness of breath. 09/11/15  Yes Historical Provider, MD   No Known Allergies Review of Systems  Unable to perform ROS: Mental status change    Physical Exam  Constitutional: He appears well-developed. He appears ill.  Cardiovascular: Tachycardia present.     Pulmonary/Chest: Effort normal and breath sounds normal.  Skin: Skin is warm and dry.    Vital Signs: BP (!) 173/77 (BP Location: Left Arm)   Pulse (!) 112   Temp 98.1 F (36.7 C) (Oral)   Resp 18   Ht 6' (1.829 m)   Wt 101.2 kg (223 lb)   SpO2 93%   BMI 30.24 kg/m  Pain Assessment: 0-10 POSS *See Group Information*: 1-Acceptable,Awake and alert Pain Score: 0-No pain   SpO2: SpO2: 93 % O2 Device:SpO2: 93 % O2 Flow Rate: .   IO: Intake/output summary:  Intake/Output Summary (Last 24 hours) at 11/19/15 6720 Last data filed at 11/18/15 1930  Gross per 24 hour  Intake             1546 ml  Output                0 ml  Net             1546 ml    LBM: Last BM Date: 11/18/15 Baseline Weight: Weight: 101.2 kg (223 lb) Most recent weight: Weight: 101.2 kg (223 lb)      Palliative Assessment/Data: 30%    Discussed with Dr Darvin Neighbours  Time In: 1500 Time Out: 1630 Time Total: 90 min Greater than 50%  of this time was spent counseling and coordinating care related to the above assessment and plan.  Signed by: Jeremiah Lessen, NP   Please contact Palliative Medicine Team phone at [phone] for questions and concerns.  For individual provider: See Shea Evans

## 2015-11-19 NOTE — Progress Notes (Signed)
[place] [facility] at [place] NAME: [name]    MRN#:  [id number]  DATE OF BIRTH:  [birth year]  SUBJECTIVE:  Hospital Day: 1 day [name] is a 75 y.o. male presenting with Rectal Bleeding .   No further bleeding  REVIEW OF SYSTEMS:  CONSTITUTIONAL: No fever, fatigue or weakness.  EYES: No blurred or double vision.  EARS, NOSE, AND THROAT: No tinnitus or ear pain.  RESPIRATORY: No cough, shortness of breath, wheezing or hemoptysis.  CARDIOVASCULAR: No chest pain, orthopnea, edema.  GASTROINTESTINAL: No nausea, vomiting, diarrhea or abdominal pain.  GENITOURINARY: No dysuria, hematuria.  ENDOCRINE: No polyuria, nocturia,  HEMATOLOGY: No anemia, easy bruising or bleeding SKIN: No rash or lesion. MUSCULOSKELETAL: No joint pain or arthritis.   NEUROLOGIC: No tingling, numbness, weakness.  PSYCHIATRY: No anxiety or depression.   DRUG ALLERGIES:  No Known Allergies  VITALS:  Blood pressure (!) 173/77, pulse (!) 112, temperature 98.1 F (36.7 C), temperature source Oral, resp. rate 18, height 6' (1.829 m), weight 101.2 kg (223 lb), SpO2 93 %.  PHYSICAL EXAMINATION:  VITAL SIGNS: Vitals:   11/18/15 2036 11/19/15 0428  BP: (!) 158/74 (!) 173/77  Pulse: (!) 102 (!) 112  Resp:    Temp: 99.3 F (37.4 C) 98.1 F (36.7 C)   GENERAL:75 y.o.male currently in no acute distress.  HEAD: Normocephalic, atraumatic.  EYES: Pupils equal, round, reactive to light. Extraocular muscles intact. No scleral icterus.  MOUTH: Moist mucosal membrane. Dentition intact. No abscess noted.  EAR, NOSE, THROAT: Clear without exudates. No external lesions.  NECK: Supple. No thyromegaly. No nodules. No JVD.  PULMONARY: Clear to ascultation, without wheeze rails or rhonci. No use of accessory muscles, Good respiratory effort. good air entry bilaterally CHEST: Nontender to palpation.  CARDIOVASCULAR: S1 and S2. Regular rate and rhythm. No murmurs,  rubs, or gallops. No edema. Pedal pulses 2+ bilaterally.  GASTROINTESTINAL: Soft, nontender, nondistended. No masses. Positive bowel sounds. No hepatosplenomegaly.  MUSCULOSKELETAL: No swelling, clubbing, or edema. Range of motion full in all extremities.  NEUROLOGIC: Cranial nerves II through XII are intact.  Decreased motor strength in lower extremity's SKIN: No ulceration, lesions, rashes, or cyanosis. Skin warm and dry. Turgor intact.  PSYCHIATRIC:  The patient is awake, alert and oriented. Flat affect  LABORATORY PANEL:   CBC  Recent Labs Lab 11/19/15 0901  WBC 15.0*  HGB 11.0*  HCT 33.3*  PLT 78*   ------------------------------------------------------------------------------------------------------------------  Chemistries   Recent Labs Lab 11/17/15 1844 11/18/15 0534  NA 137 139  K 4.3 4.5  CL 105 109  CO2 22 24  GLUCOSE 128* 91  BUN 15 14  CREATININE 0.74 0.87  CALCIUM 9.1 8.7*  AST 44*  --   ALT 38  --   ALKPHOS 203*  --   BILITOT 0.7  --    ------------------------------------------------------------------------------------------------------------------  Cardiac Enzymes No results for input(s): TROPONINI in the last 168 hours. ------------------------------------------------------------------------------------------------------------------  RADIOLOGY:  Mr Jeri Cos Wo Contrast  Result Date: 11/19/2015 CLINICAL DATA:  Encephalopathy. History of prostate cancer and metastatic lung cancer on chemotherapy. Rectal bleeding. EXAM: MRI HEAD WITHOUT AND WITH CONTRAST TECHNIQUE: Multiplanar, multiecho pulse sequences of the brain and surrounding structures were obtained without and with intravenous contrast. CONTRAST:  8m MULTIHANCE GADOBENATE DIMEGLUMINE 529 MG/ML IV SOLN COMPARISON:  None. FINDINGS: Brain: There is no evidence of acute infarct, midline shift, or extra-axial fluid collection. Moderate cerebral atrophy is noted. Periventricular white matter T2  hyperintensities  are nonspecific but may reflect mild chronic small vessel ischemic disease or post treatment changes. Small chronic infarcts are noted in the right paramedian pons and posterior right corona radiata. There are numerous predominantly ring-enhancing brain lesions. A single lesion in the right cerebellum measures 9 mm. There are at least 13 supratentorial lesions. The largest lesion in the right cerebral hemisphere measures 2.2 x 1.8 cm in the frontal lobe (series 14, image 35), with the largest left hemispheric lesion located in the posterior frontal lobe (series 14, image 46). Multiple lesions demonstrate chronic blood products, with a 1.8 x 1.3 cm left frontal lobe lesion potentially with some subacute blood products (series 14, image 42). The right cerebellar lesion is also hemorrhagic, potentially with subacute blood products. There is mild edema associated with some of the larger supratentorial lesions without significant mass effect. Vascular: Major intracranial vascular flow voids are preserved. Skull and upper cervical spine: No suspicious osseous lesion identified. Sinuses/Orbits: Unremarkable orbits. Clear paranasal sinuses. Small left mastoid effusion. Other: None. IMPRESSION: 1. 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 Mild associated edema without significant mass effect. 2. 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 Electronically Signed   By: Logan Bores M.D.   On: 11/19/2015 12:18    EKG:   Orders placed or performed during the hospital encounter of 11/09/15  . ED EKG  . ED EKG    ASSESSMENT AND PLAN:   [name] is a 75 y.o. male presenting with Rectal Bleeding . Admitted 11/17/2015 : Day #: 1 day   1. GI bleed - likely diverticular. Appreciate GI input. Pepcid stopped. Hemoglobin dropped initially but now stable No endoscopy procedures. Hemoglobin stable  2. Neuroendocrine carcinoma with known metastatic  disease MRI brain checked today and shows multiple metastatic lesions. Poor prognosis.  Discussed with palliative care. Family meeting 2 PM.    All the records are reviewed and case discussed with Care Management/Social Workerr. Management plans discussed with the patient, family and they are in agreement.  CODE STATUS: dnr   TOTAL TIME TAKING CARE OF THIS PATIENT: 28 minutes.   POSSIBLE D/C IN 1-2DAYS, DEPENDING ON CLINICAL CONDITION.   Hillary Bow R M.D on 11/19/2015 at 12:32 PM  Between 7am to 6pm - Pager - [phone]  After 6pm: House Pager: - [phone]  [facility]  Office  [phone]  CC: Primary care physician; Idelle Crouch, MD

## 2015-11-19 NOTE — Consult Note (Signed)
I will sign off, reconsult if needed and patient agrees.  MRI shows multiple metastatic lesions.

## 2015-11-19 NOTE — Progress Notes (Signed)
[facility] NOTE  Patient Care Team: Idelle Crouch, MD as PCP - General (Internal Medicine) Margaretha Sheffield, MD (Otolaryngology)  CHIEF COMPLAINTS/PURPOSE OF CONSULTATION: metastatic lung cancer.  HISTORY OF PRESENTING ILLNESS:  [name] 75 y.o.  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  Patient was recently admitted to [facility] for a GI bleed; suspected diverticular; colonoscopy not done because of patient's reluctance/ and also because of multiple comorbidities.  Patient is brought again for rectal bleeding. Currently resolved. Patient had been intermittently confused. MRI brain showed multiple brain metastases.   Patient is more lucid in the evening. He is accompanied by his wife. Overall he feels poorly.  ROS: A complete 10 point review of system is done which is negative except mentioned above in history of present illness  MEDICAL HISTORY:  Past Medical History:  Diagnosis Date  . Arthritis   . Blood transfusion without reported diagnosis    December 28, 2014 8 units   . Cancer ([place])    liver   . GERD (gastroesophageal reflux disease)   . Gout   . History of blood transfusion    Februrary 2017 4units   . Hyperlipidemia   . Hypertension   . Lung cancer ([place])   . 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 Prostate cancer ([place]) 03/2013   had seed implant and radiation for 5 weeks  . Type 2 diabetes mellitus ([place])   . Wears glasses     SURGICAL HISTORY: Past Surgical History:  Procedure Laterality Date  . COLONOSCOPY  2010  . CYSTOSCOPY N/A 07/04/2013   Procedure: CYSTOSCOPY FLEXIBLE;  Surgeon: Claybon Jabs, MD;  Location[facility];  Service: Urology;  Laterality: N/A;  . INGUINAL HERNIA REPAIR Left 2005  . LIVER  BIOPSY Right 11/02/2015  . PROSTATE BIOPSY    . RADIOACTIVE SEED IMPLANT N/A 07/04/2013   Procedure: RADIOACTIVE SEED IMPLANT;  Surgeon: Claybon Jabs, MD;  Location: [facility];  Service: Urology;  Laterality: N/A;  . REPAIR CHRONIC INCARCERATED RECURRENT LEFT INGUINAL HERNIA  10-23-2008  . TRANSURETHRAL RESECTION OF BLADDER TUMOR N/A 01/02/2015   Procedure: Cystoscopy clot evalucation and fulgration;  Surgeon: Kathie Rhodes, MD;  Location: [facility];  Service: Urology;  Laterality: N/A;    SOCIAL HISTORY: Social History   Social History  . Marital status: Married    Spouse name: N/A  . Number of children: N/A  . Years of education: N/A   Occupational History  . retired    Social History Main Topics  . Smoking status: Former Smoker    Packs/day: 1.00    Years: 30.00    Types: Cigarettes    Quit date: 02/01/2008  . Smokeless tobacco: Never Used  . Alcohol use No  . Drug use: No  . Sexual activity: Not on file   Other Topics Concern  . Not on file   Social History Narrative  . No narrative on file    FAMILY HISTORY: Family History  Problem Relation Age of Onset  . Cancer Mother     ovarian  . Heart disease Father   . Cancer Sister     breast  . Breast cancer Sister   . Cancer Brother     lung  . Cancer Brother  prostate  . Prostate cancer Brother   . Breast cancer Sister   . Lung cancer Brother   . Lung cancer Brother     ALLERGIES:  has No Known Allergies.  MEDICATIONS:  Current Facility-Administered Medications  Medication Dose Route Frequency Provider Last Rate Last Dose  . acetaminophen (TYLENOL) tablet 650 mg  650 mg Oral Q6H PRN Saundra Shelling, MD   650 mg at 11/19/15 1701   Or  . acetaminophen (TYLENOL) suppository 650 mg  650 mg Rectal Q6H PRN Pavan Pyreddy, MD      . albuterol (PROVENTIL) (2.5 MG/3ML) 0.083% nebulizer solution 3 mL  3 mL Inhalation Q6H PRN Pavan Pyreddy, MD      . colchicine tablet 0.6 mg  0.6 mg Oral Daily Lytle Butte, MD   0.6 mg at 11/19/15 0820  . dexamethasone (DECADRON) tablet 10 mg  10 mg Oral Q8H Srikar Sudini, MD   10 mg at 11/19/15 1459  . fluticasone (FLONASE) 50 MCG/ACT nasal spray 2 spray  2 spray Each Nare Daily Saundra Shelling, MD   2 spray at 11/19/15 0820  . guaiFENesin (MUCINEX) 12 hr tablet 1,200 mg  1,200 mg Oral Daily Saundra Shelling, MD   1,200 mg at 11/19/15 0820  . insulin aspart (novoLOG) injection 0-9 Units  0-9 Units Subcutaneous TID WC Hillary Bow, MD   2 Units at 11/19/15 1658  . losartan (COZAAR) tablet 25 mg  25 mg Oral Daily Saundra Shelling, MD   25 mg at 11/19/15 0820  . metoprolol succinate (TOPROL-XL) 24 hr tablet 25 mg  25 mg Oral Daily Hillary Bow, MD   25 mg at 11/19/15 1432  . ondansetron (ZOFRAN) tablet 4 mg  4 mg Oral Q6H PRN Saundra Shelling, MD       Or  . ondansetron (ZOFRAN) injection 4 mg  4 mg Intravenous Q6H PRN Pavan Pyreddy, MD      . tamsulosin (FLOMAX) capsule 0.4 mg  0.4 mg Oral QPC supper Saundra Shelling, MD   0.4 mg at 11/19/15 1657      .  PHYSICAL EXAMINATION:  Vitals:   11/19/15 0428 11/19/15 1339  BP: (!) 173/77 (!) 156/74  Pulse: (!) 112 98  Resp:  20  Temp: 98.1 F (36.7 C) 98.1 F (36.7 C)   Filed Weights   11/17/15 1841  Weight: 223 lb (101.2 kg)    GENERAL:moderately nourished well-developed; Alert, no distress and comfortable.   With his wife EYES:positive for pallor.  OROPHARYNX: no thrush or ulceration. NECK: supple, no masses felt LYMPH:  no palpable lymphadenopathy in the cervical, axillary or inguinal regions LUNGS: decreased breath sounds to auscultation at bases and  No wheeze or crackles HEART/CVS: regular rate & rhythm and no murmurs; bilateral  lower extremity edema ABDOMEN: abdomen soft, non-tender and normal bowel sounds Musculoskeletal:no cyanosis of digits and no clubbing  PSYCH: alert & oriented x 3 with fluent speech NEURO: no focal motor/sensory deficits SKIN:  no rashes or significant lesions  LABORATORY  DATA:  I have reviewed the data as listed Lab Results  Component Value Date   WBC 15.0 (H) 11/19/2015   HGB 11.0 (L) 11/19/2015   HCT 33.3 (L) 11/19/2015   MCV 73.8 (L) 11/19/2015   PLT 78 (L) 11/19/2015    Recent Labs  03/17/15 1946  11/04/15 1004 11/09/15 2327 11/17/15 1844 11/18/15 0534  NA 139  < > 138 139 137 139  K 4.4  < > 4.2 4.6 4.3 4.5  CL 106  < > 105 106 105 109  CO2 22  < > 21* '25 22 24  '$ GLUCOSE 211*  < > 182* 195* 128* 91  BUN 26*  < > 18 26* 15 14  CREATININE 0.97  < > 0.83 0.94 0.74 0.87  CALCIUM 9.2  < > 9.7 8.9 9.1 8.7*  GFRNONAA >60  < > >60 >60 >60 >60  GFRAA >60  < > >60 >60 >60 >60  PROT 6.7  < > 7.1 6.7 7.1  --   ALBUMIN 4.0  < > 3.7 3.2* 3.2*  --   AST 22  < > 141* 77* 44*  --   ALT 19  < > 105* 64* 38  --   ALKPHOS 55  < > 279* 244* 203*  --   BILITOT 0.4  < > 1.2 1.2 0.7  --   BILIDIR <0.1*  --   --   --   --   --   IBILI NOT CALCULATED  --   --   --   --   --   < > = values in this interval not displayed.  RADIOGRAPHIC STUDIES: I have personally reviewed the radiological images as listed and agreed with the findings in the report. Mr Jeri Cos Wo Contrast  Result Date: 11/19/2015 CLINICAL DATA:  Encephalopathy. History of prostate cancer and metastatic lung cancer on chemotherapy. Rectal bleeding. EXAM: MRI HEAD WITHOUT AND WITH CONTRAST TECHNIQUE: Multiplanar, multiecho pulse sequences of the brain and surrounding structures were obtained without and with intravenous contrast. CONTRAST:  35m MULTIHANCE GADOBENATE DIMEGLUMINE 529 MG/ML IV SOLN COMPARISON:  None. FINDINGS: Brain: There is no evidence of acute infarct, midline shift, or extra-axial fluid collection. Moderate cerebral atrophy is noted. Periventricular white matter T2 hyperintensities are nonspecific but may reflect mild chronic small vessel ischemic disease or post treatment changes. Small chronic infarcts are noted in the right paramedian pons and posterior right corona radiata.  There are numerous predominantly ring-enhancing brain lesions. A single lesion in the right cerebellum measures 9 mm. There are at least 13 supratentorial lesions. The largest lesion in the right cerebral hemisphere measures 2.2 x 1.8 cm in the frontal lobe (series 14, image 35), with the largest left hemispheric lesion located in the posterior frontal lobe (series 14, image 46). Multiple lesions demonstrate chronic blood products, with a 1.8 x 1.3 cm left frontal lobe lesion potentially with some subacute blood products (series 14, image 42). The right cerebellar lesion is also hemorrhagic, potentially with subacute blood products. There is mild edema associated with some of the larger supratentorial lesions without significant mass effect. Vascular: Major intracranial vascular flow voids are preserved. Skull and upper cervical spine: No suspicious osseous lesion identified. Sinuses/Orbits: Unremarkable orbits. Clear paranasal sinuses. Small left mastoid effusion. Other: None. IMPRESSION: 1. 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 Mild associated edema without significant mass effect. 2. 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 Electronically Signed   By: ALogan BoresM.D.   On: 11/19/2015 12:18   [place]  Result Date: 10/26/2015 INDICATION: 75year old male with multiple liver lesions. He has been referred for biopsy. EXAM: ULTRASOUND BIOPSY CORE LIVER MEDICATIONS: None. ANESTHESIA/SEDATION: Moderate (conscious) sedation was employed during this procedure. A total of Versed 1.0 mg and Fentanyl 50 mcg was administered intravenously. Moderate Sedation Time: 15 minutes. The patient's level of consciousness and vital signs were monitored continuously by radiology nursing throughout the procedure under my direct  supervision. FLUOROSCOPY TIME:  None COMPLICATIONS: None PROCEDURE: Informed written consent was obtained from the patient after a thorough discussion of  the procedural risks, benefits and alternatives. All questions were addressed. Maximal Sterile Barrier Technique was utilized including caps, mask, sterile gowns, sterile gloves, sterile drape, hand hygiene and skin antiseptic. A timeout was performed prior to the initiation of the procedure. Patient positioned supine position on the ultrasound table. Ultrasound survey of the upper abdomen was performed with images stored and sent to PACs. The patient was then prepped and draped in the usual sterile fashion. The skin and subcutaneous tissues were generously infiltrated 1% lidocaine for local anesthesia. Using ultrasound guidance, 17 gauge guide needle was passed into targeted liver lesion of the right liver lobe. Multiple 18 gauge core biopsy were achieved. Gel-Foam pledget was infused with a small amount of saline. Needle was removed and a final image was stored. Patient tolerated the procedure well and remained hemodynamically stable throughout. No complications were encountered and no significant blood loss was encountered. IMPRESSION: Status post ultrasound-guided core biopsy of right liver lobe lesion. Tissue specimen sent to pathology for complete histopathologic analysis. Signed, Dulcy Fanny. Earleen Newport, DO [facility] Electronically Signed   By: Corrie Mckusick D.O.   On: 10/26/2015 12:56   Dg Chest Port 1 View  Result Date: 11/10/2015 CLINICAL DATA:  Acute onset of rectal bleeding.  Initial encounter. EXAM: PORTABLE CHEST 1 VIEW COMPARISON:  Chest radiograph performed 12/31/2014, and CT of the chest performed 10/12/2015 FINDINGS: The patient's known bilateral lung cancer is better characterized on recent CT. Previously noted pulmonary nodules are less apparent on the current study. The large right basilar mass is partially characterized. There is elevation of the right hemidiaphragm. No definite pleural effusion or pneumothorax is seen. The  cardiomediastinal silhouette is borderline normal in size. No acute osseous abnormalities are seen. IMPRESSION: Known bilateral lung cancer is better characterized on recent CT. Previously noted pulmonary nodules are less apparent on the current study, likely reflecting mild interval improvement. Large right basilar lung mass again noted. Elevation of the right hemidiaphragm Electronically Signed   By: Garald Balding M.D.   On: 11/10/2015 00:40    ASSESSMENT & PLAN:  # 75 year old male patient with recently diagnosed lung cancer/large cell neuroendocrine with metastasis to the liver currently admitted the hospital for GI bleed.  #Metastatic lung cancer to the liver/large cell neuroendocrine status post 1 cycle of chemotherapy with carboplatin and etoposide approximately 10 days ago.   # Confusion intermittent- delirium- and also brain MRI shows multiple brain metastases.  # Poor performance status ECOG performance status of 4/multiple medical problems.  # I do long discussion the patient and his wife- given his significant decline in performance status/- I do not think patient would do well with ongoing chemotherapy; or radiation therapy.  # Recommend hospice. Palliative care has been consulted. Discussed at length with the patient's wife in detail.  # Above plan of care was discussed with Dr.Sudini.   All questions were answered.   Cammie Sickle, MD 11/19/2015 6:34 PM

## 2015-11-19 NOTE — Progress Notes (Signed)
Patient removed IV access 2 times, related to confusion. Attempted to re-insert, patient became more aggressive, refused to re-insert IV. MD was paged, MD ok to leave IV out.

## 2015-11-20 ENCOUNTER — Inpatient Hospital Stay: Payer: Medicare Other

## 2015-11-20 ENCOUNTER — Inpatient Hospital Stay: Payer: Medicare Other | Admitting: Internal Medicine

## 2015-11-20 LAB — TYPE AND SCREEN
ABO/RH(D): B POS
ANTIBODY SCREEN: NEGATIVE

## 2015-11-20 LAB — GLUCOSE, CAPILLARY
Glucose-Capillary: 184 mg/dL — ABNORMAL HIGH (ref 65–99)
Glucose-Capillary: 288 mg/dL — ABNORMAL HIGH (ref 65–99)
Glucose-Capillary: 290 mg/dL — ABNORMAL HIGH (ref 65–99)

## 2015-11-20 LAB — HEMOGLOBIN: HEMOGLOBIN: 13.3 g/dL (ref 13.0–18.0)

## 2015-11-20 MED ORDER — METOPROLOL SUCCINATE ER 25 MG PO TB24: 25.0000 mg | ORAL_TABLET | Freq: Every day | ORAL | 0 refills | Status: DC

## 2015-11-20 MED ORDER — DEXAMETHASONE 4 MG PO TABS: 4.0000 mg | ORAL_TABLET | Freq: Three times a day (TID) | ORAL | 0 refills | Status: DC

## 2015-11-20 MED ORDER — OXYCODONE HCL 5 MG PO TABS: 5.0000 mg | ORAL_TABLET | Freq: Four times a day (QID) | ORAL | 0 refills | Status: DC | PRN

## 2015-11-20 MED ORDER — INSULIN GLARGINE 100 UNIT/ML ~~LOC~~ SOLN
20.0000 [IU] | Freq: Every day | SUBCUTANEOUS | Status: DC
  Administered 2015-11-20 – 2015-11-23 (×4): 20 [IU] via SUBCUTANEOUS
  Filled 2015-11-20 (×4): qty 0.2

## 2015-11-20 NOTE — Progress Notes (Addendum)
[facility] at [place] NAME: [name]    MRN#:  [id number]  DATE OF BIRTH:  18-Jun-1940  SUBJECTIVE:  Hospital Day: 2 days [name] is a 75 y.o. male presenting with Rectal Bleeding  No further bleeding. Feels well  REVIEW OF SYSTEMS:  CONSTITUTIONAL: No fever, fatigue or weakness.  EYES: No blurred or double vision.  EARS, NOSE, AND THROAT: No tinnitus or ear pain.  RESPIRATORY: No cough, shortness of breath, wheezing or hemoptysis.  CARDIOVASCULAR: No chest pain, orthopnea, edema.  GASTROINTESTINAL: No nausea, vomiting, diarrhea or abdominal pain.  GENITOURINARY: No dysuria, hematuria.  ENDOCRINE: No polyuria, nocturia,  HEMATOLOGY: No anemia, easy bruising or bleeding SKIN: No rash or lesion. MUSCULOSKELETAL: No joint pain or arthritis.   NEUROLOGIC: No tingling, numbness, weakness.  PSYCHIATRY: No anxiety or depression.   DRUG ALLERGIES:  No Known Allergies  VITALS:  Blood pressure 112/60, pulse 72, temperature 97.6 F (36.4 C), temperature source Oral, resp. rate 19, height 6' (1.829 m), weight 101.2 kg (223 lb), SpO2 100 %.  PHYSICAL EXAMINATION:  VITAL SIGNS: Vitals:   11/20/15 0900 11/20/15 1358  BP: (!) 142/69 112/60  Pulse: 80 72  Resp:  19  Temp:  97.6 F (36.4 C)   GENERAL:75 y.o.male currently in no acute distress.  HEAD: Normocephalic, atraumatic.  EYES: Pupils equal, round, reactive to light. Extraocular muscles intact. No scleral icterus.  MOUTH: Moist mucosal membrane. Dentition intact. No abscess noted.  EAR, NOSE, THROAT: Clear without exudates. No external lesions.  NECK: Supple. No thyromegaly. No nodules. No JVD.  PULMONARY: Clear to ascultation, without wheeze rails or rhonci. No use of accessory muscles, Good respiratory effort. good air entry bilaterally CHEST: Nontender to palpation.  CARDIOVASCULAR: S1 and S2. Regular rate and rhythm. No murmurs, rubs, or gallops. No edema.  Pedal pulses 2+ bilaterally.  GASTROINTESTINAL: Soft, nontender, nondistended. No masses. Positive bowel sounds. No hepatosplenomegaly.  MUSCULOSKELETAL: No swelling, clubbing, or edema. Range of motion full in all extremities.  NEUROLOGIC: Cranial nerves II through XII are intact.   Decreased motor strength in lower extremity's SKIN: No ulceration, lesions, rashes, or cyanosis. Skin warm and dry. Turgor intact.  PSYCHIATRIC:  The patient is awake, alert and oriented. Flat affect  LABORATORY PANEL:   CBC  Recent Labs Lab 11/19/15 0901 11/20/15 0510  WBC 15.0*  --   HGB 11.0* 13.3  HCT 33.3*  --   PLT 78*  --    ------------------------------------------------------------------------------------------------------------------  Chemistries   Recent Labs Lab 11/17/15 1844 11/18/15 0534  NA 137 139  K 4.3 4.5  CL 105 109  CO2 22 24  GLUCOSE 128* 91  BUN 15 14  CREATININE 0.74 0.87  CALCIUM 9.1 8.7*  AST 44*  --   ALT 38  --   ALKPHOS 203*  --   BILITOT 0.7  --    ------------------------------------------------------------------------------------------------------------------  Cardiac Enzymes No results for input(s): TROPONINI in the last 168 hours. ------------------------------------------------------------------------------------------------------------------  RADIOLOGY:  Mr Jeri Cos Wo Contrast  Result Date: 11/19/2015 CLINICAL DATA:  Encephalopathy. History of prostate cancer and metastatic lung cancer on chemotherapy. Rectal bleeding. EXAM: MRI HEAD WITHOUT AND WITH CONTRAST TECHNIQUE: Multiplanar, multiecho pulse sequences of the brain and surrounding structures were obtained without and with intravenous contrast. CONTRAST:  64m MULTIHANCE GADOBENATE DIMEGLUMINE 529 MG/ML IV SOLN COMPARISON:  None. FINDINGS: Brain: There is no evidence of acute infarct, midline shift, or extra-axial fluid collection. Moderate cerebral atrophy is noted. Periventricular  white  matter T2 hyperintensities are nonspecific but may reflect mild chronic small vessel ischemic disease or post treatment changes. Small chronic infarcts are noted in the right paramedian pons and posterior right corona radiata. There are numerous predominantly ring-enhancing brain lesions. A single lesion in the right cerebellum measures 9 mm. There are at least 13 supratentorial lesions. The largest lesion in the right cerebral hemisphere measures 2.2 x 1.8 cm in the frontal lobe (series 14, image 35), with the largest left hemispheric lesion located in the posterior frontal lobe (series 14, image 46). Multiple lesions demonstrate chronic blood products, with a 1.8 x 1.3 cm left frontal lobe lesion potentially with some subacute blood products (series 14, image 42). The right cerebellar lesion is also hemorrhagic, potentially with subacute blood products. There is mild edema associated with some of the larger supratentorial lesions without significant mass effect. Vascular: Major intracranial vascular flow voids are preserved. Skull and upper cervical spine: No suspicious osseous lesion identified. Sinuses/Orbits: Unremarkable orbits. Clear paranasal sinuses. Small left mastoid effusion. Other: None. IMPRESSION: 1. 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 Mild associated edema without significant mass effect. 2. 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 Electronically Signed   By: Logan Bores M.D.   On: 11/19/2015 12:18    EKG:   Orders placed or performed during the hospital encounter of 11/09/15  . ED EKG  . ED EKG    ASSESSMENT AND PLAN:   [name] is a 75 y.o. male presenting with Rectal Bleeding . Admitted 11/17/2015 : Day #: 2 days   1. GI bleed - likely diverticular. Appreciate GI input. Pepcid stopped. Hemoglobin dropped initially but now stable No endoscopy procedures. Hemoglobin stable  2. Neuroendocrine carcinoma with known  metastatic disease MRI brain checked and shows multiple metastatic lesions. Poor prognosis. Discussed with palliative care and oncology.  Discussed with wife on the phone. She is concerned. She cannot take care of patient at home. Would like him to be admitted to skilled nursing facility if possible.  For physical therapy evaluation at this time. Discussed with case management.  Plan is to discharge home with hospice or LTC with hospice with without chemo. Comfort measures if worsening in the future.  All the records are reviewed and case discussed with Care Management/Social Workerr. Management plans discussed with the patient, family and they are in agreement.  CODE STATUS: dnr   TOTAL TIME TAKING CARE OF THIS PATIENT: 28 minutes.   POSSIBLE D/C IN 1-2DAYS, DEPENDING ON CLINICAL CONDITION.   Hillary Bow R M.D on 11/20/2015 at 2:34 PM  Between 7am to 6pm - Pager - [phone]  After 6pm: House Pager: - [phone]  [facility]  Office  [phone]  CC: Primary care physician; Idelle Crouch, MD

## 2015-11-20 NOTE — Evaluation (Signed)
Physical Therapy Evaluation Patient Details Name: [name] MRN: [id number] DOB: 11/03/1940 Today's Date: 11/20/2015   History of Present Illness  Pt. is a 75 y.o. male presenting to [facility] with rectal bleeding, redness/pain in toes/feet reports gout in L great toe. PMH: prostate/lung CA, brain mets, and GI bleeding  Clinical Impression  Pt. Supine in bed upon arrival, agreeable to activity. Pt. Demonstrates generalized weakness BUE 4/5 with AROM B shoulders <100degrees. Unable to formally assess LE strength secondary to pain pt. Demonstrates grossly 4/5 LLE strength and 3+/5 RLE strength. Able to perform bed mobility min A, once seated at EOB pt. Demonstrates R lateral lean, able to correct when cued but does not maintain midline. Pt. Requires max +2 to perform sit<>stand transfer with RW pt. Demonstrates limited tolerance to weight bearing through LLE and posterior lean in standing, unable to maintain upright posture in standing with RW Unsafe to progress mobility further at this time. Pt. Was able to squat pivot transfer from EOB to chair with arm lowered, maxA +2. Pt. Motivated to progress mobility however limited by generalized weakness, imbalance, and pain in LE's Would benefit from skilled PT to address above deficits and promote optimal return to PLOF Recommend SNF placement upon d/c to follow up with further skilled PT needs.     Follow Up Recommendations SNF    Equipment Recommendations       Recommendations for Other Services       Precautions / Restrictions Precautions Precautions: Fall Restrictions Weight Bearing Restrictions: No      Mobility  Bed Mobility Overal bed mobility: Needs Assistance Bed Mobility: Supine to Sit     Supine to sit: Min assist     General bed mobility comments: Pt. able to initiate BLE movements and assist with HOB elevated and use of bedrails, requires vc's for optimal hand placement  Transfers Overall transfer level: Needs  assistance Equipment used: Rolling walker (2 wheeled) Transfers: Sit to/from [name] Sit to Stand: Max assist;+2 physical assistance   Squat pivot transfers: +2 physical assistance;Max assist     General transfer comment: Pt. required multiple attempts and vc's to achieve sit<>stand transfer in addition to +2 physical assist, LLE placed anterior to BOS, demonstrates overall flexed posture and difficulty achieving upright posture once standing, pt. demonstrates posterior lean throughout transfer. Pt. able to assist with optimal positioning to perform squat pivot transfer by scooting at EOB slowly. requires vc's and +2 assist to complete transfer  Ambulation/Gait Ambulation/Gait assistance:  (unsafe to attempt gait, pt. demonstrates low tolerance for weight bearing through LLE.)              Stairs            Wheelchair Mobility    Modified Rankin (Stroke Patients Only)       Balance Overall balance assessment: Needs assistance Sitting-balance support: Feet supported Sitting balance-Leahy Scale: Fair Sitting balance - Comments: Pt. demonstrates R lateral lean in sitting, able to self correct when verbally cued but does not maintain midline sitting independently  Postural control: Right lateral lean Standing balance support: Bilateral upper extremity supported Standing balance-Leahy Scale: Poor Standing balance comment: Pt. demonstrates posterior lean in standing with BUE support, unsafe to stand without assistance                             Pertinent Vitals/Pain Pain Assessment: 0-10 Pain Score: 6  Pain Location: L foot Pain Intervention(s): Limited  activity within patient's tolerance;Monitored during session    [place] expects to be discharged to:: Private residence Living Arrangements: Spouse/significant other Available Help at Discharge: Family (lives with wife, wife works) Type of Home: House Home Access:  Stairs to enter [place]: Can reach both Technical brewer of Steps: [place]: One [place]: Wheelchair - manual;Cane - single point;Walker - 2 wheels      Prior Function Level of Independence: Independent with assistive device(s)         Comments: pt. reports use of SPC/RW "as needed", reports performing ADL's independently however per chart review he is dependent on wife for care.      Hand Dominance        Extremity/Trunk Assessment   Upper Extremity Assessment:  (Pt. demonstrates generalized weakness BUEs grossly 4/5 AROM <100 B)           Lower Extremity Assessment:  (Unable to perform formal assessment due to pain in LE's grossly L LE4/5 RLE 3+/5)         Communication   Communication: No difficulties  Cognition Arousal/Alertness: Awake/alert Behavior During Therapy: WFL for tasks assessed/performed Overall Cognitive Status: Within Functional Limits for tasks assessed                      General Comments      Exercises     Assessment/Plan    PT Assessment Patient needs continued PT services  PT Problem List Decreased strength;Decreased mobility;Decreased balance;Decreased knowledge of use of DME;Decreased activity tolerance;Decreased coordination;Decreased range of motion          PT Treatment Interventions DME instruction;Gait training;Stair training;Therapeutic activities;Therapeutic exercise;Neuromuscular re-education;Balance training;Functional mobility training;Patient/family education    PT Goals (Current goals can be found in the Care Plan section)  Acute Rehab PT Goals Patient Stated Goal: pt. would like to get stronger PT Goal Formulation: With patient Time For Goal Achievement: 12/04/15 Potential to Achieve Goals: Good    Frequency Min 2X/week   Barriers to discharge Decreased caregiver support      Co-evaluation               End of Session Equipment Utilized During Treatment:  Gait belt Activity Tolerance: Patient tolerated treatment well Patient left: in chair;with chair alarm set;with call bell/phone within reach Nurse Communication: Mobility status         Time: 4481-8563 PT Time Calculation (min) (ACUTE ONLY): 34 min   Charges:         PT G Codes:        [name], SPT  11/20/15,3:10 PM

## 2015-11-20 NOTE — Care Management Important Message (Signed)
Important Message  Patient Details  Name: CEDRICK PARTAIN MRN: [id number] Date of Birth: [birth year]   Medicare Important Message Given:  Yes    [name], RN 11/20/2015, 8:31 AM

## 2015-11-20 NOTE — Plan of Care (Signed)
Problem: Pain Managment: Goal: General experience of comfort will improve Outcome: Progressing No c/o pain today  Problem: Activity: Goal: Risk for activity intolerance will decrease Outcome: Progressing Chair today with p.t.  More alert today.  Decadron  Po.

## 2015-11-20 NOTE — Discharge Instructions (Addendum)
Resume diet and activity as before  Follow up with Dr. Rogue Bussing in 1 week  Palliative care nurse to follow with him in SNF.

## 2015-11-20 NOTE — Progress Notes (Signed)
Inpatient Diabetes Program Recommendations  AACE/ADA: New Consensus Statement on Inpatient Glycemic Control (2015)  Target Ranges:  Prepandial:   less than 140 mg/dL      Peak postprandial:   less than 180 mg/dL (1-2 hours)      Critically ill patients:  140 - 180 mg/dL   Lab Results  Component Value Date   GLUCAP 288 (H) 11/20/2015   HGBA1C 6.5 (H) 01/01/2015    Review of Glycemic Control  Results for JAVIAN, NUDD (MRN [id number]) as of 11/20/2015 14:38  Ref. Range 11/19/2015 16:51 11/19/2015 21:19 11/20/2015 08:55 11/20/2015 11:49  Glucose-Capillary Latest Ref Range: 65 - 99 mg/dL 195 (H) 267 (H) 184 (H) 288 (H)    Diabetes history: Type 2 Outpatient Diabetes medications: NPH 30 units bid, Metformin '1000mg'$  bid Current orders for Inpatient glycemic control: Novolog 0-9 units tid, Lantus 20 units qday   * Decadron '10mg'$  q8h  Inpatient Diabetes Program Recommendations:  Consider increasing Novolog correction to 0-20 units tid while patient is on steroids.   Please change diet to carb modified/heart healthy diet.   Consider starting low dose basal insulin, Lantus 10 units qhs.  Gentry Fitz, RN, BA, [place], CDE Diabetes Coordinator Inpatient Diabetes Program  [phone] (Team Pager) [phone] ([place]) 11/20/2015 2:47 PM

## 2015-11-20 NOTE — Progress Notes (Signed)
While rounding, Shubert made initial visit to room 124. Pt was in good spirits but and felt he was getting better with the exception of his gout. Pt asked for prayer concerning his gout which was provided. CH is available for follow up as needed.    11/20/15 1200  Clinical Encounter Type  Visited With Patient  Visit Type Initial;Spiritual support  Referral From Nurse  Spiritual Encounters  Spiritual Needs Prayer

## 2015-11-21 LAB — GLUCOSE, CAPILLARY
GLUCOSE-CAPILLARY: 190 mg/dL — AB (ref 65–99)
Glucose-Capillary: 274 mg/dL — ABNORMAL HIGH (ref 65–99)
Glucose-Capillary: 204 mg/dL — ABNORMAL HIGH (ref 65–99)

## 2015-11-21 MED ORDER — COLCHICINE 0.6 MG PO TABS
0.6000 mg | ORAL_TABLET | Freq: Once | ORAL | Status: AC
  Administered 2015-11-21: 12:00:00 0.6 mg via ORAL
  Filled 2015-11-21: qty 1

## 2015-11-21 NOTE — Clinical Social Work Note (Signed)
Clinical Social Work Assessment  Patient Details  Name: [name] MRN: [id number] Date of Birth: [birth year]  Date of referral:  11/21/15               Reason for consult:  Discharge Planning                Permission sought to share information with:  Family Supports Permission granted to share information::  Yes, Verbal Permission Granted  Name::        Agency::     Relationship::   (Wife)  Contact Information:     Housing/Transportation Living arrangements for the past 2 months:  Single Family Home Source of Information:  Patient Patient Interpreter Needed:  None Criminal Activity/Legal Involvement Pertinent to Current Situation/Hospitalization:  No - Comment as needed Significant Relationships:  Other Family Members, Adult Children, Spouse Lives with:  Spouse Do you feel safe going back to the place where you live?  Yes Need for family participation in patient care:  No (Coment)  Care giving concerns:  PT recommends that patient will benefit from SNF placement for STR   Social Worker assessment / plan:  CSW met with patient at bedside. Introduced herself and her role. Discussed PT recommendation. Patient reported he's willing to go to STR at discharge. Granted CSW verbal permission to complete SNF bed search in [place]. FL2/ PASRR completed. Awaiting bed offers.   Employment status:  Retired Forensic scientist:  Medicare PT Recommendations:    Information / Referral to community resources:  [place]  Patient/Family's Response to care:  Patient is agreeable to STR at discharge.   Patient/Family's Understanding of and Emotional Response to Diagnosis, Current Treatment, and Prognosis:  Reported he understood. Thanked CSW for assistance.   Emotional Assessment Appearance:  Appears stated age Attitude/Demeanor/Rapport:   (None) Affect (typically observed):  Accepting, Calm, Pleasant Orientation:  Oriented to Self, Oriented to Place, Oriented  to  Time, Oriented to Situation Alcohol / Substance use:  Not Applicable Psych involvement (Current and /or in the community):  No (Comment)  Discharge Needs  Concerns to be addressed:  Discharge Planning Concerns Readmission within the last 30 days:  No Current discharge risk:  Chronically ill Barriers to Discharge:  Continued Medical Work up   [employer], LCSW 11/21/2015, 2:36 PM

## 2015-11-21 NOTE — Progress Notes (Signed)
Patient ID: [name], male   DOB: 01-13-1941, 75 y.o.   MRN: [id number]  [employer] PROGRESS NOTE  [name] VHQ:[id number] DOB: 11-27-1940 DOA: 11/17/2015 PCP: Jeremiah Crouch, MD  HPI/Subjective: Patient feels well and offers no complaints. States he is hungry. Only complaint is left first toe pain.  Objective: Vitals:   11/20/15 2049 11/21/15 0420  BP: 124/60 (!) 144/73  Pulse: 70 78  Resp: 19 19  Temp: 97.5 F (36.4 C) 98.3 F (36.8 C)    Filed Weights   11/17/15 1841  Weight: 101.2 kg (223 lb)    ROS: Review of Systems  Constitutional: Negative for chills and fever.  Eyes: Negative for blurred vision.  Respiratory: Negative for cough and shortness of breath.   Cardiovascular: Negative for chest pain.  Gastrointestinal: Negative for abdominal pain, constipation, diarrhea, nausea and vomiting.  Genitourinary: Negative for dysuria.  Musculoskeletal: Positive for joint pain.  Neurological: Negative for dizziness and headaches.   Exam: Physical Exam  HENT:  Nose: No mucosal edema.  Mouth/Throat: No oropharyngeal exudate or posterior oropharyngeal edema.  Eyes: Conjunctivae, EOM and lids are normal. Pupils are equal, round, and reactive to light.  Neck: No JVD present. Carotid bruit is not present. No edema present. No thyroid mass and no thyromegaly present.  Cardiovascular: S1 normal and S2 normal.  Exam reveals no gallop.   No murmur heard. Pulses:      Dorsalis pedis pulses are 2+ on the right side, and 2+ on the left side.  Respiratory: No respiratory distress. He has no wheezes. He has no rhonchi. He has no rales.  GI: Soft. Bowel sounds are normal. There is no tenderness.  Musculoskeletal:       Right ankle: He exhibits no swelling.       Left ankle: He exhibits no swelling.  Pain palpating left first toe with movement.  Lymphadenopathy:    He has no cervical adenopathy.  Neurological: He is alert. No cranial nerve deficit.  Skin:  Skin is warm. No rash noted. Nails show no clubbing.  Psychiatric: He has a normal mood and affect.      Data Reviewed: Basic Metabolic Panel:  Recent Labs Lab 11/17/15 1844 11/18/15 0534  NA 137 139  K 4.3 4.5  CL 105 109  CO2 22 24  GLUCOSE 128* 91  BUN 15 14  CREATININE 0.74 0.87  CALCIUM 9.1 8.7*   Liver Function Tests:  Recent Labs Lab 11/17/15 1844  AST 44*  ALT 38  ALKPHOS 203*  BILITOT 0.7  PROT 7.1  ALBUMIN 3.2*   CBC:  Recent Labs Lab 11/17/15 1844 11/18/15 0534 11/19/15 0901 11/20/15 0510  WBC 10.5 12.1* 15.0*  --   HGB 12.0* 10.8* 11.0* 13.3  HCT 36.8* 33.5* 33.3*  --   MCV 75.5* 76.2* 73.8*  --   PLT 51* 53* 78*  --     CBG:  Recent Labs Lab 11/19/15 2119 11/20/15 0855 11/20/15 1149 11/20/15 1650 11/21/15 0746  GLUCAP 267* 184* 288* 290* 190*    Studies: Mr [name] Wo Contrast  Result Date: 11/19/2015 CLINICAL DATA:  Encephalopathy. History of prostate cancer and metastatic lung cancer on chemotherapy. Rectal bleeding. EXAM: MRI HEAD WITHOUT AND WITH CONTRAST TECHNIQUE: Multiplanar, multiecho pulse sequences of the brain and surrounding structures were obtained without and with intravenous contrast. CONTRAST:  66m MULTIHANCE GADOBENATE DIMEGLUMINE 529 MG/ML IV SOLN COMPARISON:  None. FINDINGS: Brain: There is no evidence of acute infarct, midline shift, or  extra-axial fluid collection. Moderate cerebral atrophy is noted. Periventricular white matter T2 hyperintensities are nonspecific but may reflect mild chronic small vessel ischemic disease or post treatment changes. Small chronic infarcts are noted in the right paramedian pons and posterior right corona radiata. There are numerous predominantly ring-enhancing brain lesions. A single lesion in the right cerebellum measures 9 mm. There are at least 13 supratentorial lesions. The largest lesion in the right cerebral hemisphere measures 2.2 x 1.8 cm in the frontal lobe (series 14, image  35), with the largest left hemispheric lesion located in the posterior frontal lobe (series 14, image 46). Multiple lesions demonstrate chronic blood products, with a 1.8 x 1.3 cm left frontal lobe lesion potentially with some subacute blood products (series 14, image 42). The right cerebellar lesion is also hemorrhagic, potentially with subacute blood products. There is mild edema associated with some of the larger supratentorial lesions without significant mass effect. Vascular: Major intracranial vascular flow voids are preserved. Skull and upper cervical spine: No suspicious osseous lesion identified. Sinuses/Orbits: Unremarkable orbits. Clear paranasal sinuses. Small left mastoid effusion. Other: None. IMPRESSION: 1. 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 Mild associated edema without significant mass effect. 2. 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 Electronically Signed   By: [name] M.D.   On: 11/19/2015 12:18    Scheduled Meds: . colchicine  0.6 mg Oral Daily  . colchicine  0.6 mg Oral Once  . dexamethasone  10 mg Oral Q8H  . fluticasone  2 spray Each Nare Daily  . guaiFENesin  1,200 mg Oral Daily  . insulin aspart  0-9 Units Subcutaneous TID WC  . insulin glargine  20 Units Subcutaneous Daily  . losartan  25 mg Oral Daily  . metoprolol succinate  25 mg Oral Daily  . tamsulosin  0.4 mg Oral QPC supper    Assessment/Plan:  1. Brain metastases with neuroendocrine carcinoma. As per oncology, no further treatment. Try to taper her steroids since mental status is better. 2. Acute encephalopathy. This has improved 3. GI bleed likely diverticular in nature. Hemoglobin stable. 4. Acute gout left first toe. Given extra dose of colchicine. Dexamethasone should also help. 5. Type 2 diabetes sugars will be high on steroids. 6. Essential hypertension on losartan and metoprolol 7. BPH on Flomax 8. Weakness. Physical therapy recommended  rehabilitation. Left message for social worker.  Code Status:     Code Status Orders        Start     Ordered   11/18/15 0122  Do not attempt resuscitation (DNR)  Continuous    Question Answer Comment  In the event of cardiac or respiratory ARREST Do not call a "code blue"   In the event of cardiac or respiratory ARREST Do not perform Intubation, CPR, defibrillation or ACLS   In the event of cardiac or respiratory ARREST Use medication by any route, position, wound care, and other measures to relive pain and suffering. May use oxygen, suction and manual treatment of airway obstruction as needed for comfort.      11/18/15 0121    Code Status History    Date Active Date Inactive Code Status Order ID Comments User Context   03/17/2015  9:34 PM 03/20/2015  9:43 PM DNR [id number]  Vianne Bulls, MD ED   12/28/2014  8:58 PM 01/04/2015  8:21 PM DNR [id number]  Gennaro Africa, MD Inpatient   12/28/2014  7:59 PM 12/28/2014  8:58 PM Full Code  [id number]  Gennaro Africa, MD Inpatient     Family Communication: Spoke with wife on the phone Disposition Plan: Left message for social worker about rehabilitation area  Consultants: - Oncology - Palliative care  Time spent: 25 minutes  Loletha Grayer  [employer]

## 2015-11-21 NOTE — NC FL2 (Signed)
[place] LEVEL OF CARE SCREENING TOOL     IDENTIFICATION  Patient Name: [name] Birthdate: [birth year] Sex: male Admission Date (Current Location): 11/17/2015  [place] and [place] Number:  Engineering geologist and Address:  [facility], [address], [place], [place] [address]      Provider Number: [id number]  Attending Physician Name and Address:  Loletha Grayer, MD  Relative Name and Phone Number:       Current Level of Care: Hospital Recommended Level of Care: [place] Prior Approval Number:    Date Approved/Denied:   PASRR Number: [id number]  Discharge Plan: SNF    Current Diagnoses: Patient Active Problem List   Diagnosis Date Noted  . DNR (do not resuscitate) 11/19/2015  . Palliative care by specialist 11/19/2015  . Brain metastases ([place]) 11/19/2015  . GI bleed 11/18/2015  . Primary cancer of right lower lobe of lung ([place]) 10/30/2015  . Generalized weakness 10/30/2015  . 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 Multiple lung nodules 10/20/2015  . Iron deficiency anemia due to chronic blood loss 10/20/2015  . Prostate cancer ([place]) 10/20/2015  . Blood loss anemia 03/17/2015  . Acute blood loss anemia 12/28/2014  . Hematuria, gross 12/28/2014  . Pneumonia 12/28/2014  . Type 2 diabetes mellitus ([place]) 03/10/2013  . hypercholesterolemia 03/10/2013  . hypertension 03/10/2013  . 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 INGUINAL HERNIA 08/15/2008    Orientation RESPIRATION BLADDER Height & Weight     Time, Situation, Place, Self  Normal Incontinent Weight: 223 lb (101.2 kg) Height:  6' (182.9 cm)  BEHAVIORAL SYMPTOMS/MOOD NEUROLOGICAL BOWEL NUTRITION STATUS   (None)  (None) Continent Diet (heart healthy/carb modified )  AMBULATORY STATUS COMMUNICATION OF NEEDS Skin   Extensive Assist Verbally Normal                        Personal Care Assistance Level of Assistance  Bathing, Feeding, Dressing Bathing Assistance: Limited assistance Feeding assistance: Independent Dressing Assistance: Limited assistance     Functional Limitations Info  Sight, Hearing, Speech Sight Info: Impaired Hearing Info: Adequate Speech Info: Adequate    SPECIAL CARE FACTORS FREQUENCY                       Contractures      Additional Factors Info  Insulin Sliding Scale, Code Status, Allergies Code Status Info: DNR Allergies Info: No Known Allergies    Insulin Sliding Scale Info:  (insulin glargine (LANTUS) injection 20 Units 20 Units, Subcutaneous, Daily  ; insulin aspart (novoLOG) injection 0-9 Units 0-9 Units, Subcutaneous, 3 times daily with meals  )       Current Medications (11/21/2015):  This is the current hospital active medication list Current Facility-Administered Medications  Medication Dose Route Frequency Provider Last Rate Last Dose  . acetaminophen (TYLENOL) tablet 650 mg  650 mg Oral Q6H PRN Saundra Shelling, MD   650 mg at 11/21/15 0834   Or  . acetaminophen (TYLENOL) suppository 650 mg  650 mg Rectal Q6H PRN Pavan Pyreddy, MD      . albuterol (PROVENTIL) (2.5 MG/3ML) 0.083% nebulizer solution 3 mL  3 mL Inhalation Q6H PRN Pavan Pyreddy, MD      . colchicine tablet 0.6 mg  0.6 mg Oral Daily Lytle [place], MD   0.6 mg at 11/21/15 8182  .  dexamethasone (DECADRON) tablet 10 mg  10 mg Oral Q8H Srikar Sudini, MD   10 mg at 11/21/15 0541  . fluticasone (FLONASE) 50 MCG/ACT nasal spray 2 spray  2 spray Each Nare Daily Saundra Shelling, MD   2 spray at 11/21/15 0824  . guaiFENesin (MUCINEX) 12 hr tablet 1,200 mg  1,200 mg Oral Daily Saundra Shelling, MD   1,200 mg at 11/21/15 0823  . insulin aspart (novoLOG) injection 0-9 Units  0-9 Units Subcutaneous TID WC Hillary Bow, MD   5 Units at 11/21/15 1155  . insulin glargine (LANTUS) injection 20 Units  20 Units Subcutaneous Daily Hillary Bow, MD   20 Units at  11/21/15 [phone]  . losartan (COZAAR) tablet 25 mg  25 mg Oral Daily Saundra Shelling, MD   25 mg at 11/21/15 0823  . metoprolol succinate (TOPROL-XL) 24 hr tablet 25 mg  25 mg Oral Daily Hillary Bow, MD   25 mg at 11/21/15 0823  . ondansetron (ZOFRAN) tablet 4 mg  4 mg Oral Q6H PRN Saundra Shelling, MD       Or  . ondansetron (ZOFRAN) injection 4 mg  4 mg Intravenous Q6H PRN Pavan Pyreddy, MD      . tamsulosin (FLOMAX) capsule 0.4 mg  0.4 mg Oral QPC supper Saundra Shelling, MD   0.4 mg at 11/20/15 1821     Discharge Medications: Please see discharge summary for a list of discharge medications.  Relevant Imaging Results:  Relevant Lab Results:   Additional Information SSN [id number]  [place], LCSW

## 2015-11-22 LAB — GLUCOSE, CAPILLARY
GLUCOSE-CAPILLARY: 200 mg/dL — AB (ref 65–99)
GLUCOSE-CAPILLARY: 264 mg/dL — AB (ref 65–99)
Glucose-Capillary: 285 mg/dL — ABNORMAL HIGH (ref 65–99)
Glucose-Capillary: 231 mg/dL — ABNORMAL HIGH (ref 65–99)

## 2015-11-22 MED ORDER — PANTOPRAZOLE SODIUM 40 MG PO TBEC
40.0000 mg | DELAYED_RELEASE_TABLET | Freq: Every day | ORAL | Status: DC
  Administered 2015-11-23: 40 mg via ORAL
  Filled 2015-11-22: qty 1

## 2015-11-22 MED ORDER — ALUM & MAG HYDROXIDE-SIMETH 200-200-20 MG/5ML PO SUSP
30.0000 mL | Freq: Four times a day (QID) | ORAL | Status: DC | PRN
  Administered 2015-11-22: 16:00:00 30 mL via ORAL
  Filled 2015-11-22: qty 30

## 2015-11-22 MED ORDER — DEXAMETHASONE 4 MG PO TABS
10.0000 mg | ORAL_TABLET | Freq: Every day | ORAL | Status: DC
  Administered 2015-11-23: 10:00:00 10 mg via ORAL
  Filled 2015-11-22: qty 3

## 2015-11-22 NOTE — Care Management Important Message (Signed)
Important Message  Patient Details  Name: [name] MRN: [id number] Date of Birth: [birth year]   Medicare Important Message Given:  Yes    Carles Collet, RN 11/22/2015, 9:33 AM

## 2015-11-22 NOTE — Progress Notes (Signed)
Patient ID: [name], male   DOB: March 18, 1940, 75 y.o.   MRN: [id number]  [facility] PROGRESS NOTE  [name] PZW:[id number] DOB: 1940-07-04 DOA: 11/17/2015 PCP: Idelle Crouch, MD  HPI/Subjective: Patient feels well and offers no complaints. Tolerated breakfast. Had BM today- non bloody. Only complaint is left first toe pain.  Objective: Vitals:   11/21/15 2004 11/22/15 0410  BP: (!) 146/70 (!) 152/75  Pulse: 66 73  Resp: 18   Temp: 98.3 F (36.8 C) 97.6 F (36.4 C)    Filed Weights   11/17/15 1841  Weight: 101.2 kg (223 lb)    ROS: Review of Systems  Constitutional: Negative for chills and fever.  Eyes: Negative for blurred vision.  Respiratory: Negative for cough and shortness of breath.   Cardiovascular: Negative for chest pain.  Gastrointestinal: Negative for abdominal pain, constipation, diarrhea, nausea and vomiting.  Genitourinary: Negative for dysuria.  Musculoskeletal: Positive for joint pain.  Neurological: Negative for dizziness and headaches.   Exam: Physical Exam  HENT:  Nose: No mucosal edema.  Mouth/Throat: No oropharyngeal exudate or posterior oropharyngeal edema.  Eyes: Conjunctivae, EOM and lids are normal. Pupils are equal, round, and reactive to light.  Neck: No JVD present. Carotid bruit is not present. No edema present. No thyroid mass and no thyromegaly present.  Cardiovascular: S1 normal and S2 normal.  Exam reveals no gallop.   No murmur heard. Pulses:      Dorsalis pedis pulses are 2+ on the right side, and 2+ on the left side.  Respiratory: No respiratory distress. He has no wheezes. He has no rhonchi. He has no rales.  GI: Soft. Bowel sounds are normal. There is no tenderness.  Musculoskeletal:       Right ankle: He exhibits no swelling.       Left ankle: He exhibits no swelling.  Pain palpating left first toe with movement.  Lymphadenopathy:    He has no cervical adenopathy.  Neurological: He is alert. No  cranial nerve deficit.  Skin: Skin is warm. No rash noted. Nails show no clubbing.  Psychiatric: He has a normal mood and affect.      Data Reviewed: Basic Metabolic Panel:  Recent Labs Lab 11/17/15 1844 11/18/15 0534  NA 137 139  K 4.3 4.5  CL 105 109  CO2 22 24  GLUCOSE 128* 91  BUN 15 14  CREATININE 0.74 0.87  CALCIUM 9.1 8.7*   Liver Function Tests:  Recent Labs Lab 11/17/15 1844  AST 44*  ALT 38  ALKPHOS 203*  BILITOT 0.7  PROT 7.1  ALBUMIN 3.2*   CBC:  Recent Labs Lab 11/17/15 1844 11/18/15 0534 11/19/15 0901 11/20/15 0510  WBC 10.5 12.1* 15.0*  --   HGB 12.0* 10.8* 11.0* 13.3  HCT 36.8* 33.5* 33.3*  --   MCV 75.5* 76.2* 73.8*  --   PLT 51* 53* 78*  --     CBG:  Recent Labs Lab 11/20/15 1650 11/21/15 0746 11/21/15 1127 11/21/15 1709 11/22/15 0748  GLUCAP 290* 190* 274* 204* 200*    Studies: No results found.  Scheduled Meds: . colchicine  0.6 mg Oral Daily  . dexamethasone  10 mg Oral Q8H  . fluticasone  2 spray Each Nare Daily  . guaiFENesin  1,200 mg Oral Daily  . insulin aspart  0-9 Units Subcutaneous TID WC  . insulin glargine  20 Units Subcutaneous Daily  . losartan  25 mg Oral Daily  . metoprolol succinate  25  mg Oral Daily  . tamsulosin  0.4 mg Oral QPC supper    Assessment/Plan:  1. Brain metastases with neuroendocrine carcinoma. As per oncology, no further treatment. Try to taper his steroids since mental status is better. 2. Acute encephalopathy. This has improved 3. GI bleed likely diverticular in nature. Hemoglobin stable. No more active bleed. 4. Acute gout left first toe. Given extra dose of colchicine. Dexamethasone should also help. 5. Type 2 diabetes sugars will be high on steroids. 6. Essential hypertension on losartan and metoprolol 7. BPH on Flomax 8. Weakness. Physical therapy recommended rehabilitation. Likely placement on monday.  Code Status:     Code Status Orders        Start     Ordered    11/18/15 0122  Do not attempt resuscitation (DNR)  Continuous    Question Answer Comment  In the event of cardiac or respiratory ARREST Do not call a "code blue"   In the event of cardiac or respiratory ARREST Do not perform Intubation, CPR, defibrillation or ACLS   In the event of cardiac or respiratory ARREST Use medication by any route, position, wound care, and other measures to relive pain and suffering. May use oxygen, suction and manual treatment of airway obstruction as needed for comfort.      11/18/15 0121    Code Status History    Date Active Date Inactive Code Status Order ID Comments User Context   03/17/2015  9:34 PM 03/20/2015  9:43 PM DNR [id number]  Vianne Bulls, MD ED   12/28/2014  8:58 PM 01/04/2015  8:21 PM DNR [id number]  Gennaro Africa, MD Inpatient   12/28/2014  7:59 PM 12/28/2014  8:58 PM Full Code [id number]  Gennaro Africa, MD Inpatient     Family Communication: Spoke with wife on the phone Disposition Plan: Left message for social worker about rehabilitation area  Consultants: - Oncology - Palliative care  Time spent: 25 minutes  [place], [employer]

## 2015-11-22 NOTE — Plan of Care (Signed)
Problem: Education: Goal: Knowledge of [facility] Education information/materials will improve Outcome: Progressing Decadron frequency changed this shift from q8 hours to once daily

## 2015-11-22 NOTE — Plan of Care (Signed)
Problem: Safety: Goal: Ability to remain free from injury will improve Outcome: Progressing Pt not impulsive this shift; more alert

## 2015-11-22 NOTE — Clinical Social Work Note (Signed)
CSW visited patient at bedside to provide bed offers for STR. The patient chose [facility]. CSW noted in [place]. CSW will con't to follow pending dc needs.  Santiago Bumpers, MSW, LCSW-A [phone]

## 2015-11-23 LAB — GLUCOSE, CAPILLARY
Glucose-Capillary: 171 mg/dL — ABNORMAL HIGH (ref 65–99)
Glucose-Capillary: 182 mg/dL — ABNORMAL HIGH (ref 65–99)

## 2015-11-23 MED ORDER — INSULIN GLARGINE 100 UNIT/ML ~~LOC~~ SOLN: 20.0000 [IU] | Freq: Every day | SUBCUTANEOUS | 11 refills | Status: DC

## 2015-11-23 MED ORDER — DEXAMETHASONE 4 MG PO TABS: 4.0000 mg | ORAL_TABLET | Freq: Three times a day (TID) | ORAL | 0 refills | Status: DC

## 2015-11-23 MED ORDER — PANTOPRAZOLE SODIUM 40 MG PO TBEC: 40.0000 mg | DELAYED_RELEASE_TABLET | Freq: Every day | ORAL | 0 refills | Status: DC

## 2015-11-23 MED ORDER — COLCHICINE 0.6 MG PO TABS: 0.6000 mg | ORAL_TABLET | Freq: Every day | ORAL | 0 refills | Status: DC

## 2015-11-23 NOTE — Clinical Social Work Placement (Signed)
   CLINICAL SOCIAL WORK PLACEMENT  NOTE  Date:  11/23/2015  Patient Details  Name: [name] MRN: [id number] Date of Birth: [birth year]  Clinical Social Work is seeking post-discharge placement for this patient at the [place] level of care (*CSW will initial, date and re-position this form in  chart as items are completed):  No   Patient/family provided with [place] Work Department's list of facilities offering this level of care within the geographic area requested by the patient (or if unable, by the patient's family).  No   Patient/family informed of their freedom to choose among providers that offer the needed level of care, that participate in Medicare, Medicaid or managed care program needed by the patient, have an available bed and are willing to accept the patient.  No   Patient/family informed of 's ownership interest in [facility] and [facility], as well as of the fact that they are under no obligation to receive care at these facilities.  PASRR submitted to EDS on 11/21/15     PASRR number received on 11/21/15     Existing PASRR number confirmed on       FL2 transmitted to all facilities in geographic area requested by pt/family on 11/21/15     FL2 transmitted to all facilities within larger geographic area on       Patient informed that his/her managed care company has contracts with or will negotiate with certain facilities, including the following:        Yes   Patient/family informed of bed offers received.  Patient chooses bed at  [facility] )     Physician recommends and patient chooses bed at      Patient to be transferred to  [facility] ) on 11/23/15.  Patient to be transferred to facility by  [facility] )     Patient family notified on 11/23/15 of transfer.  Name of family member notified:   (Patient's wife is aware of D/C today. )     PHYSICIAN       Additional  Comment:    _______________________________________________ Charlayne Vultaggio, Veronia Beets, LCSW 11/23/2015, 2:03 PM

## 2015-11-23 NOTE — Progress Notes (Signed)
CH made a follow up visit with Pt. Pt was in good spirits and thanked me for praying about his gout the other day. He said it is much better. Pt is hoping to discharge today. Pt did not indicate a need for prayer at this time.

## 2015-11-23 NOTE — Progress Notes (Signed)
New referral for Palliative to follow at [facility] following discharge received from [place]. Plan is for discharge today. [facility] referral made aware. Thank you. Flo Shanks RN, BSN, [facility] [place], [facility] [phone] c

## 2015-11-23 NOTE — Discharge Summary (Signed)
[facility] [place] NAME: [name]    MR#:  [id number]  DATE OF BIRTH:  April 05, 1940  DATE OF ADMISSION:  11/17/2015 ADMITTING PHYSICIAN: Saundra Shelling, MD  DATE OF DISCHARGE: 11/23/2015  PRIMARY CARE PHYSICIAN: SPARKS,JEFFREY D, MD    ADMISSION DIAGNOSIS:  Lower GI bleed [K92.2] Abscess of right thigh [L02.415]  DISCHARGE DIAGNOSIS:  Principal Problem:   GI bleed Active Problems:   DNR (do not resuscitate)   Palliative care by specialist   Brain metastases ([place])   Gout.  SECONDARY DIAGNOSIS:   Past Medical History:  Diagnosis Date  . Arthritis   . Blood transfusion without reported diagnosis    December 28, 2014 8 units   . Cancer ([place])    liver   . GERD (gastroesophageal reflux disease)   . Gout   . History of blood transfusion    Februrary 2017 4units   . Hyperlipidemia   . Hypertension   . Lung cancer ([place])   . 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 Prostate cancer ([place]) 03/2013   had seed implant and radiation for 5 weeks  . Type 2 diabetes mellitus ([place])   . Wears glasses     HOSPITAL COURSE:   1. Brain metastases with neuroendocrine carcinoma. As per oncology, no further treatment. Try to taper his steroids since mental status is better. 2. Acute encephalopathy. This has improved 3. GI bleed likely diverticular in nature. Hemoglobin stable. No more active bleed. 4. Acute gout left first toe. Given extra dose of colchicine. Dexamethasone should also help. 5. Type 2 diabetes sugars will be high on steroids. 6. Essential hypertension on losartan and metoprolol 7. BPH on Flomax 8. Weakness. Physical therapy recommended rehabilitation. Likely placement on monday.  DISCHARGE CONDITIONS:   Stable.  CONSULTS OBTAINED:  Treatment Team:  Manya Silvas, MD Cammie Sickle, MD  DRUG ALLERGIES:  No Known Allergies  DISCHARGE MEDICATIONS:   Current Discharge Medication  List    START taking these medications   Details  colchicine 0.6 MG tablet Take 1 tablet (0.6 mg total) by mouth daily. Qty: 4 tablet, Refills: 0    dexamethasone (DECADRON) 4 MG tablet Take 1 tablet (4 mg total) by mouth 3 (three) times daily. Qty: 21 tablet, Refills: 0    insulin glargine (LANTUS) 100 UNIT/ML injection Inject 0.2 mLs (20 Units total) into the skin daily. Qty: 10 mL, Refills: 11    metoprolol succinate (TOPROL-XL) 25 MG 24 hr tablet Take 1 tablet (25 mg total) by mouth daily. Qty: 30 tablet, Refills: 0    oxyCODONE (ROXICODONE) 5 MG immediate release tablet Take 1 tablet (5 mg total) by mouth every 6 (six) hours as needed for severe pain. Qty: 20 tablet, Refills: 0    pantoprazole (PROTONIX) 40 MG tablet Take 1 tablet (40 mg total) by mouth daily. Qty: 30 tablet, Refills: 0      CONTINUE these medications which have NOT CHANGED   Details  ferrous sulfate 325 (65 FE) MG tablet Take 1 tablet (325 mg total) by mouth 2 (two) times daily with a meal. Qty: 60 tablet, Refills: 0    fexofenadine (ALLEGRA) 180 MG tablet Take 180 mg by mouth daily.    Fluticasone Propionate (FLONASE NA) Place 1 spray into the nose daily.     furosemide (LASIX) 20 MG tablet Take 20 mg by mouth daily.    guaiFENesin (MUCINEX) 600 MG 12 hr tablet Take 1,200  mg by mouth daily.     losartan (COZAAR) 25 MG tablet Take 25 mg by mouth daily.     metFORMIN (GLUCOPHAGE) 1000 MG tablet Take 1,000 mg by mouth 2 (two) times daily with a meal.     montelukast (SINGULAIR) 10 MG tablet Take 1 tablet by mouth at bedtime.     omeprazole (PRILOSEC) 20 MG capsule Take 20 mg by mouth daily as needed (heartburn).     potassium chloride (K-DUR) 10 MEQ tablet Take 1 tablet by mouth 2 (two) times daily.    tamsulosin (FLOMAX) 0.4 MG CAPS capsule Take 1 capsule (0.4 mg total) by mouth daily after supper. Qty: 30 capsule, Refills: 0    VENTOLIN HFA 108 (90 Base) MCG/ACT inhaler Inhale 1 puff into the  lungs every 6 (six) hours as needed for shortness of breath.      STOP taking these medications     aspirin EC 81 MG tablet      insulin NPH Human (HUMULIN N,NOVOLIN N) 100 UNIT/ML injection          DISCHARGE INSTRUCTIONS:    Follow with PMD and oncology clinic.  pallaitive care to follow in SNF.  If you experience worsening of your admission symptoms, develop shortness of breath, life threatening emergency, suicidal or homicidal thoughts you must seek medical attention immediately by calling 911 or calling your MD immediately  if symptoms less severe.  You Must read complete instructions/literature along with all the possible adverse reactions/side effects for all the Medicines you take and that have been prescribed to you. Take any new Medicines after you have completely understood and accept all the possible adverse reactions/side effects.   Please note  You were cared for by a hospitalist during your hospital stay. If you have any questions about your discharge medications or the care you received while you were in the hospital after you are discharged, you can call the unit and asked to speak with the hospitalist on call if the hospitalist that took care of you is not available. Once you are discharged, your primary care physician will handle any further medical issues. Please note that NO REFILLS for any discharge medications will be authorized once you are discharged, as it is imperative that you return to your primary care physician (or establish a relationship with a primary care physician if you do not have one) for your aftercare needs so that they can reassess your need for medications and monitor your lab values.    Today   CHIEF COMPLAINT:   Chief Complaint  Patient presents with  . Rectal Bleeding    HISTORY OF PRESENT ILLNESS:  [name]  is a 75 y.o. male with a known history of GERD, hyperlipidemia, hypertension, neuroendocrine carcinoma with  metastasis to liver, prostate cancer, type 2 diabetes mellitus presented to the emergency room with rectal bleed. Patient had one episode of rectal bleed around 9:30 PM last night. It was frank blood according to patient's wife. No history of vomiting of blood or any coughing of blood. No complaints of any abdominal pain. No nausea or vomiting. Patient had similar complaints one week ago and he was evaluated for rectal bleed at [facility]. Patient did not have any procedures such as EGD or endoscopy done at [facility]. Today in the evaluation in the emergency room hemoglobin has been stable. No complaints of chest pain, shortness of breath. No headache dizziness and blurry vision. Hospitalist service was consulted for further care of  the patient.   VITAL SIGNS:  Blood pressure (!) 117/59, pulse 75, temperature 98.4 F (36.9 C), temperature source Oral, resp. rate 18, height 6' (1.829 m), weight 101.2 kg (223 lb), SpO2 95 %.  I/O:   Intake/Output Summary (Last 24 hours) at 11/23/15 0945 Last data filed at 11/22/15 1330  Gross per 24 hour  Intake              480 ml  Output                0 ml  Net              480 ml    PHYSICAL EXAMINATION:   HENT:  Nose: No mucosal edema.  Mouth/Throat: No oropharyngeal exudate or posterior oropharyngeal edema.  Eyes: Conjunctivae, EOM and lids are normal. Pupils are equal, round, and reactive to light.  Neck: No JVD present. Carotid bruit is not present. No edema present. No thyroid mass and no thyromegaly present.  Cardiovascular: S1 normal and S2 normal.  Exam reveals no gallop.   No murmur heard. Pulses:      Dorsalis pedis pulses are 2+ on the right side, and 2+ on the left side.  Respiratory: No respiratory distress. He has no wheezes. He has no rhonchi. He has no rales.  GI: Soft. Bowel sounds are normal. There is no tenderness.  Musculoskeletal:       Right ankle: He exhibits no swelling.       Left ankle: He exhibits no swelling.   Pain palpating left first toe with movement.  Lymphadenopathy:    He has no cervical adenopathy.  Neurological: He is alert. No cranial nerve deficit.  Skin: Skin is warm. No rash noted. Nails show no clubbing.  Psychiatric: He has a normal mood and affect.   DATA REVIEW:   CBC  Recent Labs Lab 11/19/15 0901 11/20/15 0510  WBC 15.0*  --   HGB 11.0* 13.3  HCT 33.3*  --   PLT 78*  --     Chemistries   Recent Labs Lab 11/17/15 1844 11/18/15 0534  NA 137 139  K 4.3 4.5  CL 105 109  CO2 22 24  GLUCOSE 128* 91  BUN 15 14  CREATININE 0.74 0.87  CALCIUM 9.1 8.7*  AST 44*  --   ALT 38  --   ALKPHOS 203*  --   BILITOT 0.7  --     Cardiac Enzymes No results for input(s): TROPONINI in the last 168 hours.  Microbiology Results  Results for orders placed or performed during the hospital encounter of 03/17/15  Urine culture     Status: None   Collection Time: 03/17/15  8:29 PM  Result Value Ref Range Status   Specimen Description URINE, CLEAN CATCH  Final   Special Requests NONE  Final   Culture   Final    MULTIPLE SPECIES PRESENT, SUGGEST RECOLLECTION Performed at [facility]    Report Status 03/19/2015 FINAL  Final    RADIOLOGY:  No results found.  EKG:   Orders placed or performed during the hospital encounter of 11/09/15  . ED EKG  . ED EKG      Management plans discussed with the patient, family and they are in agreement.  CODE STATUS:     Code Status Orders        Start     Ordered   11/18/15 0122  Do not attempt resuscitation (DNR)  Continuous  Question Answer Comment  In the event of cardiac or respiratory ARREST Do not call a "code blue"   In the event of cardiac or respiratory ARREST Do not perform Intubation, CPR, defibrillation or ACLS   In the event of cardiac or respiratory ARREST Use medication by any route, position, wound care, and other measures to relive pain and suffering. May use oxygen, suction and manual treatment  of airway obstruction as needed for comfort.      11/18/15 0121    Code Status History    Date Active Date Inactive Code Status Order ID Comments User Context   03/17/2015  9:34 PM 03/20/2015  9:43 PM DNR [id number]  Vianne Bulls, MD ED   12/28/2014  8:58 PM 01/04/2015  8:21 PM DNR [id number]  Gennaro Africa, MD Inpatient   12/28/2014  7:59 PM 12/28/2014  8:58 PM Full Code [id number]  Gennaro Africa, MD Inpatient      TOTAL TIME TAKING CARE OF THIS PATIENT: 35 minutes.    Vaughan Basta M.D on 11/23/2015 at 9:45 AM  Between 7am to 6pm - Pager - [phone]  After 6pm go to [email or web address] - password [email or web address] [facility]  Office  [phone]  CC: Primary care physician; Idelle Crouch, MD   Note: This dictation was prepared with Dragon dictation along with smaller phrase technology. Any transcriptional errors that result from this process are unintentional.

## 2015-11-23 NOTE — Progress Notes (Signed)
Report called to Levada Dy, Therapist, sports at [facility] and EMS contacted for transport.  Clarise Cruz, RN

## 2015-11-23 NOTE — Progress Notes (Signed)
Patient's wife [name] chose [facility]. Patient is medically stable for D/C to [facility] today. Per [facility] admissions coordinator at [facility] patient will go to room 504. RN will call report to 500 hall RN at [phone] and arrange EMS for transport. Clinical Education officer, museum (CSW) sent D/C orders to [facility] [facility] via [employer]. Patient is aware of above. Patient's wife [name] is aware of above. Please reconsult if future social work needs arise. CSW signing off.   [employer], LCSW [phone]

## 2015-11-25 ENCOUNTER — Telehealth: Payer: Self-pay | Admitting: *Deleted

## 2015-11-25 NOTE — Telephone Encounter (Signed)
Per Barnabas Lister, if [facility] does not transport him, we can add him to  [facility]

## 2015-11-25 NOTE — Telephone Encounter (Signed)
Called to report that he is in [facility] and was inquiring how he is to get to appt 11/2, he is not able to get in her car. She will check with [facility] and if they cannot transport, she will call us back to see if our Lucianne Lei can transport him.

## 2015-11-30 ENCOUNTER — Ambulatory Visit: Payer: Medicare Other

## 2015-12-03 ENCOUNTER — Ambulatory Visit
Admission: RE | Admit: 2015-12-03 | Discharge: 2015-12-03 | Disposition: A | Discharge status: Home / Self Care | Payer: Medicare Other | Source: Ambulatory Visit | Attending: Radiation Oncology | Admitting: Radiation Oncology

## 2015-12-03 ENCOUNTER — Inpatient Hospital Stay: Payer: No Typology Code available for payment source

## 2015-12-03 ENCOUNTER — Ambulatory Visit: Payer: Medicare Other

## 2015-12-03 ENCOUNTER — Inpatient Hospital Stay: Payer: No Typology Code available for payment source | Attending: Internal Medicine | Admitting: Internal Medicine

## 2015-12-03 DIAGNOSIS — M1990 Unspecified osteoarthritis, unspecified site: Secondary | ICD-10-CM | POA: Diagnosis not present

## 2015-12-03 DIAGNOSIS — C787 Secondary malignant neoplasm of liver and intrahepatic bile duct: Secondary | ICD-10-CM | POA: Diagnosis not present

## 2015-12-03 DIAGNOSIS — C3490 Malignant neoplasm of unspecified part of unspecified bronchus or lung: Secondary | ICD-10-CM

## 2015-12-03 DIAGNOSIS — E119 Type 2 diabetes mellitus without complications: Secondary | ICD-10-CM | POA: Insufficient documentation

## 2015-12-03 DIAGNOSIS — C7931 Secondary malignant neoplasm of brain: Secondary | ICD-10-CM | POA: Insufficient documentation

## 2015-12-03 DIAGNOSIS — C3431 Malignant neoplasm of lower lobe, right bronchus or lung: Secondary | ICD-10-CM | POA: Insufficient documentation

## 2015-12-03 DIAGNOSIS — Z7982 Long term (current) use of aspirin: Secondary | ICD-10-CM | POA: Insufficient documentation

## 2015-12-03 DIAGNOSIS — Z923 Personal history of irradiation: Secondary | ICD-10-CM | POA: Diagnosis not present

## 2015-12-03 DIAGNOSIS — Z79899 Other long term (current) drug therapy: Secondary | ICD-10-CM | POA: Insufficient documentation

## 2015-12-03 DIAGNOSIS — E785 Hyperlipidemia, unspecified: Secondary | ICD-10-CM | POA: Insufficient documentation

## 2015-12-03 DIAGNOSIS — Z794 Long term (current) use of insulin: Secondary | ICD-10-CM | POA: Diagnosis not present

## 2015-12-03 DIAGNOSIS — M109 Gout, unspecified: Secondary | ICD-10-CM | POA: Diagnosis not present

## 2015-12-03 DIAGNOSIS — Z803 Family history of malignant neoplasm of breast: Secondary | ICD-10-CM | POA: Insufficient documentation

## 2015-12-03 DIAGNOSIS — Z87891 Personal history of nicotine dependence: Secondary | ICD-10-CM | POA: Diagnosis not present

## 2015-12-03 DIAGNOSIS — K219 Gastro-esophageal reflux disease without esophagitis: Secondary | ICD-10-CM | POA: Diagnosis not present

## 2015-12-03 DIAGNOSIS — I10 Essential (primary) hypertension: Secondary | ICD-10-CM | POA: Insufficient documentation

## 2015-12-03 DIAGNOSIS — Z8546 Personal history of malignant neoplasm of prostate: Secondary | ICD-10-CM | POA: Diagnosis not present

## 2015-12-03 DIAGNOSIS — Z8673 Personal history of transient ischemic attack (TIA), and cerebral infarction without residual deficits: Secondary | ICD-10-CM | POA: Insufficient documentation

## 2015-12-03 DIAGNOSIS — R609 Edema, unspecified: Secondary | ICD-10-CM | POA: Insufficient documentation

## 2015-12-03 DIAGNOSIS — D72829 Elevated white blood cell count, unspecified: Secondary | ICD-10-CM | POA: Diagnosis not present

## 2015-12-03 DIAGNOSIS — E875 Hyperkalemia: Secondary | ICD-10-CM | POA: Diagnosis not present

## 2015-12-03 DIAGNOSIS — Z8042 Family history of malignant neoplasm of prostate: Secondary | ICD-10-CM | POA: Insufficient documentation

## 2015-12-03 DIAGNOSIS — Z510 Encounter for antineoplastic radiation therapy: Secondary | ICD-10-CM | POA: Insufficient documentation

## 2015-12-03 DIAGNOSIS — C7A8 Other malignant neuroendocrine tumors: Secondary | ICD-10-CM | POA: Insufficient documentation

## 2015-12-03 DIAGNOSIS — Z9221 Personal history of antineoplastic chemotherapy: Secondary | ICD-10-CM | POA: Insufficient documentation

## 2015-12-03 DIAGNOSIS — M129 Arthropathy, unspecified: Secondary | ICD-10-CM | POA: Insufficient documentation

## 2015-12-03 LAB — COMPREHENSIVE METABOLIC PANEL
ALBUMIN: 2.9 g/dL — AB (ref 3.5–5.0)
ALK PHOS: 213 U/L — AB (ref 38–126)
ALT: 63 U/L (ref 17–63)
ANION GAP: 12 (ref 5–15)
AST: 57 U/L — ABNORMAL HIGH (ref 15–41)
BILIRUBIN TOTAL: 0.8 mg/dL (ref 0.3–1.2)
BUN: 36 mg/dL — AB (ref 6–20)
CALCIUM: 9 mg/dL (ref 8.9–10.3)
CO2: 20 mmol/L — ABNORMAL LOW (ref 22–32)
Chloride: 99 mmol/L — ABNORMAL LOW (ref 101–111)
Creatinine, Ser: 1 mg/dL (ref 0.61–1.24)
GFR calc Af Amer: >60 mL/min (ref >60)
GLUCOSE: 333 mg/dL — AB (ref 65–99)
Potassium: 5.8 mmol/L — ABNORMAL HIGH (ref 3.5–5.1)
Sodium: 131 mmol/L — ABNORMAL LOW (ref 135–145)
TOTAL PROTEIN: 6.9 g/dL (ref 6.5–8.1)

## 2015-12-03 LAB — CBC WITH DIFFERENTIAL/PLATELET
BAND NEUTROPHILS: 0 %
BLASTS: 0 %
Basophils Absolute: 0 10*3/uL (ref 0–0.1)
Basophils Relative: 0 %
EOS ABS: 0 10*3/uL (ref 0–0.7)
Eosinophils Relative: 0 %
HEMATOCRIT: 38 % — AB (ref 40.0–52.0)
HEMOGLOBIN: 12.4 g/dL — AB (ref 13.0–18.0)
LYMPHS PCT: 6 %
Lymphs Abs: 1.4 10*3/uL (ref 1.0–3.6)
MCH: 24.7 pg — ABNORMAL LOW (ref 26.0–34.0)
MCHC: 32.7 g/dL (ref 32.0–36.0)
MCV: 75.6 fL — AB (ref 80.0–100.0)
MONOS PCT: 6 %
Metamyelocytes Relative: 0 %
Monocytes Absolute: 1.4 10*3/uL — ABNORMAL HIGH (ref 0.2–1.0)
Myelocytes: 0 %
NEUTROS PCT: 88 %
NRBC: 0 /100{WBCs}
Neutro Abs: 20.4 10*3/uL — ABNORMAL HIGH (ref 1.4–6.5)
OTHER: 0 %
PROMYELOCYTES ABS: 0 %
Platelets: 268 10*3/uL (ref 150–440)
RBC: 5.02 MIL/uL (ref 4.40–5.90)
RDW: 17.1 % — ABNORMAL HIGH (ref 11.5–14.5)
WBC: 23.2 10*3/uL — AB (ref 3.8–10.6)

## 2015-12-03 NOTE — Progress Notes (Signed)
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 Patient is currently status post 1 cycle of carboplatin topics site.  In the interim patient was admitted to the hospital for mental status changes- GI bleed. Patient noted to have brain metastases on MRI of the brain. Treatment was not offered as patient/family had decided in hospice. However currently interested in follow-up treatments is currently in the rehabilitation.   As per the wife patient is feeling better. No blood in stools black stools. Appetite is improving. Mild chronic shortness of breath or chest pain. No abdominal pain.    ROS: A complete 10 point review of system is done which is negative except mentioned above in history of present illness  MEDICAL HISTORY:  Past Medical History:  Diagnosis Date  . Arthritis   . Blood transfusion without reported diagnosis    December 28, 2014 8 units   . Cancer ([place])     liver   . GERD (gastroesophageal reflux disease)   . Gout   . History of blood transfusion    Februrary 2017 4units   . Hyperlipidemia   . Hypertension   . Lung cancer ([place])   . 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 Prostate cancer ([place]) 03/2013   had seed implant and radiation for 5 weeks  . Type 2 diabetes mellitus ([place])   . Wears glasses     SURGICAL HISTORY: Past Surgical History:  Procedure Laterality Date  . COLONOSCOPY  2010  . CYSTOSCOPY N/A 07/04/2013   Procedure: CYSTOSCOPY FLEXIBLE;  Surgeon: Claybon Jabs, MD;  [facility]: [facility];  Service: Urology;  Laterality: N/A;  . INGUINAL HERNIA REPAIR Left 2005  . LIVER BIOPSY Right 11/02/2015  . PROSTATE BIOPSY    . RADIOACTIVE SEED IMPLANT N/A 07/04/2013   Procedure: RADIOACTIVE SEED IMPLANT;  Surgeon: Claybon Jabs, MD;  Location: [facility];  Service: Urology;  Laterality: N/A;  . REPAIR CHRONIC INCARCERATED RECURRENT LEFT INGUINAL HERNIA  10-23-2008  . TRANSURETHRAL RESECTION OF BLADDER TUMOR N/A 01/02/2015   Procedure: Cystoscopy clot evalucation and fulgration;  Surgeon: Kathie Rhodes, MD;  Location: [facility];  Service: Urology;  Laterality: N/A;    SOCIAL HISTORY: former smoker-- quit 6-7 years; no alcohol; used to [place]/ [place]/ [address];. Lives with wife..  Social History   Social History  . Marital status: Married  Spouse name: N/A  . Number of children: N/A  . Years of education: N/A   Occupational History  . retired    Social History Main Topics  . Smoking status: Former Smoker    Packs/day: 1.00    Years: 30.00    Types: Cigarettes    Quit date: 02/01/2008  . Smokeless tobacco: Never Used  . Alcohol use No  . Drug use: No  . Sexual activity: Not on file   Other Topics Concern  . Not on file   Social History Narrative  . No narrative on file    FAMILY HISTORY: sister breast ca- 50-60s. Sister - breast ca mid- 4s.. Mom- Family History   Problem Relation Age of Onset  . Cancer Mother     ovarian  . Heart disease Father   . Cancer Sister     breast  . Breast cancer Sister   . Cancer Brother     lung  . Cancer Brother     prostate  . Prostate cancer Brother   . Breast cancer Sister   . Lung cancer Brother   . Lung cancer Brother     ALLERGIES:  has No Known Allergies.  MEDICATIONS:  Current Outpatient Prescriptions  Medication Sig Dispense Refill  . dexamethasone (DECADRON) 4 MG tablet Take 1 tablet (4 mg total) by mouth 3 (three) times daily. 21 tablet 0  . ferrous sulfate 325 (65 FE) MG tablet Take 1 tablet (325 mg total) by mouth 2 (two) times daily with a meal. 60 tablet 0  . fexofenadine (ALLEGRA) 180 MG tablet Take 180 mg by mouth daily.    . Fluticasone Propionate (FLONASE NA) Place 1 spray into the nose daily.     . furosemide (LASIX) 20 MG tablet Take 20 mg by mouth daily.    Marland Kitchen guaiFENesin (MUCINEX) 600 MG 12 hr tablet Take 1,200 mg by mouth daily.     . insulin glargine (LANTUS) 100 UNIT/ML injection Inject 0.2 mLs (20 Units total) into the skin daily. 10 mL 11  . losartan (COZAAR) 25 MG tablet Take 25 mg by mouth daily.     . metFORMIN (GLUCOPHAGE) 1000 MG tablet Take 1,000 mg by mouth 2 (two) times daily with a meal.     . metoprolol succinate (TOPROL-XL) 25 MG 24 hr tablet Take 1 tablet (25 mg total) by mouth daily. 30 tablet 0  . montelukast (SINGULAIR) 10 MG tablet Take 1 tablet by mouth at bedtime.     Marland Kitchen omeprazole (PRILOSEC) 20 MG capsule Take 20 mg by mouth daily as needed (heartburn).     Marland Kitchen oxyCODONE (ROXICODONE) 5 MG immediate release tablet Take 1 tablet (5 mg total) by mouth every 6 (six) hours as needed for severe pain. 20 tablet 0  . pantoprazole (PROTONIX) 40 MG tablet Take 1 tablet (40 mg total) by mouth daily. 30 tablet 0  . potassium chloride (K-DUR) 10 MEQ tablet Take 1 tablet by mouth 2 (two) times daily.    . tamsulosin (FLOMAX) 0.4 MG CAPS capsule Take 1 capsule (0.4 mg total) by  mouth daily after supper. (Patient taking differently: Take 0.8 mg by mouth daily after supper. ) 30 capsule 0  . VENTOLIN HFA 108 (90 Base) MCG/ACT inhaler Inhale 1 puff into the lungs every 6 (six) hours as needed for shortness of breath.    Marland Kitchen albuterol (PROVENTIL HFA;VENTOLIN HFA) 108 (90 Base) MCG/ACT inhaler Inhale into the lungs.    Marland Kitchen aspirin EC 81 MG tablet  Take 81 mg by mouth.    Marland Kitchen atorvastatin (LIPITOR) 40 MG tablet     . Cholecalciferol (VITAMIN D3) 1000 units CAPS Take by mouth.    . colchicine 0.6 MG tablet Take 1 tablet (0.6 mg total) by mouth daily. 4 tablet 0  . cyanocobalamin (TH VITAMIN B12) 100 MCG tablet Take by mouth.    . insulin NPH Human (HUMULIN N,NOVOLIN N) 100 UNIT/ML injection Inject into the skin.    . mirabegron ER (MYRBETRIQ) 50 MG TB24 tablet Take 50 mg by mouth.    . omega-3 acid ethyl esters (LOVAZA) 1 g capsule Take by mouth.    . ramipril (ALTACE) 10 MG capsule      No current facility-administered medications for this visit.       Marland Kitchen  PHYSICAL EXAMINATION: ECOG PERFORMANCE STATUS: 3 - Symptomatic, >50% confined to bed  Vitals:   12/03/15 1116  BP: 119/77  Pulse: 87  Resp: 18  Temp: (!) 96.4 F (35.8 C)   Filed Weights   12/03/15 1116  Weight: 223 lb (101.2 kg)    GENERAL: Well-nourished well-developed; Alert, no distress and comfortable.   With his wife. He is in a wheelchair because of generalized weakness. EYES: no pallor or icterus OROPHARYNX: no thrush or ulceration; good dentition  NECK: supple, no masses felt LYMPH:  no palpable lymphadenopathy in the cervical, axillary or inguinal regions LUNGS: Bilateral decreased breath sounds right more than left auscultation and  No wheeze or crackles HEART/CVS: regular rate & rhythm and no murmurs;Bil 2+ lower extremity edema ABDOMEN: abdomen soft, non-tender and normal bowel sounds Musculoskeletal:no cyanosis of digits and no clubbing  PSYCH: alert & oriented x 3 with fluent speech NEURO: no  focal motor/sensory deficits SKIN:  no rashes or significant lesions    LABORATORY DATA:  I have reviewed the data as listed Lab Results  Component Value Date   WBC 23.2 (H) 12/03/2015   HGB 12.4 (L) 12/03/2015   HCT 38.0 (L) 12/03/2015   MCV 75.6 (L) 12/03/2015   PLT 268 12/03/2015    Recent Labs  03/17/15 1946  11/09/15 2327 11/17/15 1844 11/18/15 0534 12/03/15 1012  NA 139  < > 139 137 139 131*  K 4.4  < > 4.6 4.3 4.5 5.8*  CL 106  < > 106 105 109 99*  CO2 22  < > '25 22 24 '$ 20*  GLUCOSE 211*  < > 195* 128* 91 333*  BUN 26*  < > 26* 15 14 36*  CREATININE 0.97  < > 0.94 0.74 0.87 1.00  CALCIUM 9.2  < > 8.9 9.1 8.7* 9.0  GFRNONAA >60  < > >60 >60 >60 >60  GFRAA >60  < > >60 >60 >60 >60  PROT 6.7  < > 6.7 7.1  --  6.9  ALBUMIN 4.0  < > 3.2* 3.2*  --  2.9*  AST 22  < > 77* 44*  --  57*  ALT 19  < > 64* 38  --  63  ALKPHOS 55  < > 244* 203*  --  213*  BILITOT 0.4  < > 1.2 0.7  --  0.8  BILIDIR <0.1*  --   --   --   --   --   IBILI NOT CALCULATED  --   --   --   --   --   < > = values in this interval not displayed.  RADIOGRAPHIC STUDIES: I have personally reviewed the radiological images  as listed and agreed with the findings in the report. Mr Jeri Cos Wo Contrast  Result Date: 11/19/2015 CLINICAL DATA:  Encephalopathy. History of prostate cancer and metastatic lung cancer on chemotherapy. Rectal bleeding. EXAM: MRI HEAD WITHOUT AND WITH CONTRAST TECHNIQUE: Multiplanar, multiecho pulse sequences of the brain and surrounding structures were obtained without and with intravenous contrast. CONTRAST:  91m MULTIHANCE GADOBENATE DIMEGLUMINE 529 MG/ML IV SOLN COMPARISON:  None. FINDINGS: Brain: There is no evidence of acute infarct, midline shift, or extra-axial fluid collection. Moderate cerebral atrophy is noted. Periventricular white matter T2 hyperintensities are nonspecific but may reflect mild chronic small vessel ischemic disease or post treatment changes. Small chronic  infarcts are noted in the right paramedian pons and posterior right corona radiata. There are numerous predominantly ring-enhancing brain lesions. A single lesion in the right cerebellum measures 9 mm. There are at least 13 supratentorial lesions. The largest lesion in the right cerebral hemisphere measures 2.2 x 1.8 cm in the frontal lobe (series 14, image 35), with the largest left hemispheric lesion located in the posterior frontal lobe (series 14, image 46). Multiple lesions demonstrate chronic blood products, with a 1.8 x 1.3 cm left frontal lobe lesion potentially with some subacute blood products (series 14, image 42). The right cerebellar lesion is also hemorrhagic, potentially with subacute blood products. There is mild edema associated with some of the larger supratentorial lesions without significant mass effect. Vascular: Major intracranial vascular flow voids are preserved. Skull and upper cervical spine: No suspicious osseous lesion identified. Sinuses/Orbits: Unremarkable orbits. Clear paranasal sinuses. Small left mastoid effusion. Other: None. IMPRESSION: 1. 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 Mild associated edema without significant mass effect. 2. 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 Electronically Signed   By: ALogan BoresM.D.   On: 11/19/2015 12:18   Dg Chest Port 1 View  Result Date: 11/10/2015 CLINICAL DATA:  Acute onset of rectal bleeding.  Initial encounter. EXAM: PORTABLE CHEST 1 VIEW COMPARISON:  Chest radiograph performed 12/31/2014, and CT of the chest performed 10/12/2015 FINDINGS: The patient's known bilateral lung cancer is better characterized on recent CT. Previously noted pulmonary nodules are less apparent on the current study. The large right basilar mass is partially characterized. There is elevation of the right hemidiaphragm. No definite pleural effusion or pneumothorax is seen. The cardiomediastinal silhouette is  borderline normal in size. No acute osseous abnormalities are seen. IMPRESSION: Known bilateral lung cancer is better characterized on recent CT. Previously noted pulmonary nodules are less apparent on the current study, likely reflecting mild interval improvement. Large right basilar lung mass again noted. Elevation of the right hemidiaphragm Electronically Signed   By: JGarald BaldingM.D.   On: 11/10/2015 00:40    ASSESSMENT & PLAN:   Primary cancer of right lower lobe of lung (HCC) Large Cell Neuroendocrine of the lung- with metastasis to the liver s/p 1 cycle of carbo-Etop. Poor tolerance to chemotherapy./Declining performance status.  # HOLD chemo of now- given brain mets. Discussed that plan for chemotherapy in future to be decided on patient's performance status/symptomatology. If his performance status improves- then recommend chemotherapy; if not recommend hospice.   # Brain mets- spoke to Dr.Crystal; evaluated the patient today. Plan whole brain radiation.  # leucocytosis- sec to steroids.   # Hyperkalemia- 5.8 on K dur 20 BID; recommend stopping.   # Follow with me in approximately 4 weeks with labs.   All questions were answered. The patient  knows to call the clinic with any problems, questions or concerns.    Cammie Sickle, MD 12/03/2015 12:25 PM

## 2015-12-03 NOTE — Consult Note (Signed)
NEW PATIENT EVALUATION  Name: [name]  MRN: [id number]  Date:   12/03/2015     DOB: 06-07-1940   This 75 y.o. 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  REFERRING PHYSICIAN: Idelle Crouch, MD  CHIEF COMPLAINT: No chief complaint on file.   DIAGNOSIS: There were no encounter diagnoses.   PREVIOUS INVESTIGATIONS:  MRI scans reviewed Pathology reports reviewed Clinical notes reviewed  HPI: Patient is a 75 year old male who was diagnosed back in October with large cell neuroendocrine tumor of the lung. At diagnosis he had metastasis to the liver. He was started on palliative chemotherapy with carboplatinum etoposide. He had a hospital admission to [facility] for GI bleed. Patient has multiple comorbidities including hypertension type 2 diabetes. Is also status post I-125 interstitial implant for prostate cancer. He is seen today since recent MRI scan shows multiple at least 14 lesions consistent with metastatic disease to his brain with mild associated edema. He has been started on steroids. CT scan of his chest shows wide spread disease in his lungs mediastinum hilar lymph nodes and liver. Remarkably he is fairly asymptomatic specifically denies cough hemoptysis chest tightness bone pain or any neurologic complaints. He is seen today for radiation oncology opinion regarding whole brain radiation.  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    PAST SURGICAL HISTORY:  Past Surgical History:  Procedure  Laterality Date  . COLONOSCOPY  2010  . CYSTOSCOPY N/A 07/04/2013   Procedure: CYSTOSCOPY FLEXIBLE;  Surgeon: Claybon Jabs, MD;  Location: [facility];  Service: Urology;  Laterality: N/A;  . INGUINAL HERNIA REPAIR Left 2005  . LIVER BIOPSY Right 11/02/2015  . PROSTATE BIOPSY    . RADIOACTIVE SEED IMPLANT N/A 07/04/2013   Procedure: RADIOACTIVE SEED IMPLANT;  Surgeon: Claybon Jabs, MD;  Location: [facility];  Service: Urology;  Laterality: N/A;  . REPAIR CHRONIC INCARCERATED RECURRENT LEFT INGUINAL HERNIA  10-23-2008  . TRANSURETHRAL RESECTION OF BLADDER TUMOR N/A 01/02/2015   Procedure: Cystoscopy clot evalucation and fulgration;  Surgeon: Kathie Rhodes, MD;  Location: [facility];  Service: Urology;  Laterality: N/A;    FAMILY HISTORY: family history includes Breast cancer in his sister and sister; Cancer in his brother, brother, mother, and sister; Heart disease in his father; Lung cancer in his brother and brother; Prostate cancer in his brother.  SOCIAL HISTORY:  reports that he quit smoking about 7 years ago. His smoking use included Cigarettes. He has a 30.00 pack-year smoking history. He has never used smokeless tobacco. He reports that he does not drink alcohol or use drugs.  ALLERGIES: Review of patient's allergies indicates no known allergies.  MEDICATIONS:  Current Outpatient Prescriptions  Medication Sig Dispense Refill  . albuterol (PROVENTIL HFA;VENTOLIN HFA) 108 (90 Base) MCG/ACT inhaler Inhale into the lungs.    Marland Kitchen aspirin EC 81 MG tablet Take 81 mg by mouth.    Marland Kitchen atorvastatin (LIPITOR) 40 MG tablet     . Cholecalciferol (VITAMIN D3) 1000 units CAPS Take by mouth.    . colchicine 0.6 MG tablet Take 1 tablet (0.6 mg total)  by mouth daily. 4 tablet 0  . cyanocobalamin (TH VITAMIN B12) 100 MCG tablet Take by mouth.    . dexamethasone (DECADRON) 4 MG tablet Take 1 tablet (4 mg total) by mouth 3 (three) times daily. 21 tablet 0  . ferrous sulfate 325  (65 FE) MG tablet Take 1 tablet (325 mg total) by mouth 2 (two) times daily with a meal. 60 tablet 0  . fexofenadine (ALLEGRA) 180 MG tablet Take 180 mg by mouth daily.    . Fluticasone Propionate (FLONASE NA) Place 1 spray into the nose daily.     . furosemide (LASIX) 20 MG tablet Take 20 mg by mouth daily.    Marland Kitchen guaiFENesin (MUCINEX) 600 MG 12 hr tablet Take 1,200 mg by mouth daily.     . insulin glargine (LANTUS) 100 UNIT/ML injection Inject 0.2 mLs (20 Units total) into the skin daily. 10 mL 11  . insulin NPH Human (HUMULIN N,NOVOLIN N) 100 UNIT/ML injection Inject into the skin.    Marland Kitchen losartan (COZAAR) 25 MG tablet Take 25 mg by mouth daily.     . metFORMIN (GLUCOPHAGE) 1000 MG tablet Take 1,000 mg by mouth 2 (two) times daily with a meal.     . metoprolol succinate (TOPROL-XL) 25 MG 24 hr tablet Take 1 tablet (25 mg total) by mouth daily. 30 tablet 0  . mirabegron ER (MYRBETRIQ) 50 MG TB24 tablet Take 50 mg by mouth.    . montelukast (SINGULAIR) 10 MG tablet Take 1 tablet by mouth at bedtime.     Marland Kitchen omega-3 acid ethyl esters (LOVAZA) 1 g capsule Take by mouth.    Marland Kitchen omeprazole (PRILOSEC) 20 MG capsule Take 20 mg by mouth daily as needed (heartburn).     Marland Kitchen oxyCODONE (ROXICODONE) 5 MG immediate release tablet Take 1 tablet (5 mg total) by mouth every 6 (six) hours as needed for severe pain. 20 tablet 0  . pantoprazole (PROTONIX) 40 MG tablet Take 1 tablet (40 mg total) by mouth daily. 30 tablet 0  . potassium chloride (K-DUR) 10 MEQ tablet Take 1 tablet by mouth 2 (two) times daily.    . ramipril (ALTACE) 10 MG capsule     . tamsulosin (FLOMAX) 0.4 MG CAPS capsule Take 1 capsule (0.4 mg total) by mouth daily after supper. (Patient taking differently: Take 0.8 mg by mouth daily after supper. ) 30 capsule 0  . VENTOLIN HFA 108 (90 Base) MCG/ACT inhaler Inhale 1 puff into the lungs every 6 (six) hours as needed for shortness of breath.     No current facility-administered medications for this  encounter.     ECOG PERFORMANCE STATUS:  0 - Asymptomatic  REVIEW OF SYSTEMS:  Patient denies any weight loss, fatigue, weakness, fever, chills or night sweats. Patient denies any loss of vision, blurred vision. Patient denies any ringing  of the ears or hearing loss. No irregular heartbeat. Patient denies heart murmur or history of fainting. Patient denies any chest pain or pain radiating to her upper extremities. Patient denies any shortness of breath, difficulty breathing at night, cough or hemoptysis. Patient denies any swelling in the lower legs. Patient denies any nausea vomiting, vomiting of blood, or coffee ground material in the vomitus. Patient denies any stomach pain. Patient states has had normal bowel movements no significant constipation or diarrhea. Patient denies any dysuria, hematuria or significant nocturia. Patient denies any problems walking, swelling in the joints or loss of balance. Patient denies any skin changes, loss of hair or  loss of weight. Patient denies any excessive worrying or anxiety or significant depression. Patient denies any problems with insomnia. Patient denies excessive thirst, polyuria, polydipsia. Patient denies any swollen glands, patient denies easy bruising or easy bleeding. Patient denies any recent infections, allergies or URI. Patient "s visual fields have not changed significantly in recent time.    PHYSICAL EXAM: There were no vitals taken for this visit. Well-developed wheelchair-bound male in NAD. Crude visual fields are within normal range cranial nerves II through XII are grossly intact motor sensory and DTR levels are equal and symmetric in upper lower extremities. Proprioception is intact. Well-developed well-nourished patient in NAD. HEENT reveals PERLA, EOMI, discs not visualized.  Oral cavity is clear. No oral mucosal lesions are identified. Neck is clear without evidence of cervical or supraclavicular adenopathy. Lungs are clear to A&P. Cardiac  examination is essentially unremarkable with regular rate and rhythm without murmur rub or thrill. Abdomen is benign with no organomegaly or masses noted. Motor sensory and DTR levels are equal and symmetric in the upper and lower extremities. Cranial nerves II through XII are grossly intact. Proprioception is intact. No peripheral adenopathy or edema is identified. No motor or sensory levels are noted. Crude visual fields are within normal range.  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 I would plan on delivering 3000 cGy in 10 fractions. Risks and benefits of treatment including skin reaction fatigue alteration of blood counts possible cognitive decline all were discussed in detail with the patient and his daughter. I have personally set up and ordered CT simulation for early next week.  I would like to take this opportunity to thank you for allowing me to participate in the care of your patient.Armstead Peaks., MD

## 2015-12-03 NOTE — Assessment & Plan Note (Addendum)
Large Cell Neuroendocrine of the lung- with metastasis to the liver s/p 1 cycle of carbo-Etop. Poor tolerance to chemotherapy./Declining performance status.  # HOLD chemo of now- given brain mets. Discussed that plan for chemotherapy in future to be decided on patient's performance status/symptomatology. If his performance status improves- then recommend chemotherapy; if not recommend hospice.   # Brain mets- spoke to Dr.Crystal; evaluated the patient today. Plan whole brain radiation.  # leucocytosis- sec to steroids.   # Hyperkalemia- 5.8 on K dur 20 BID; recommend stopping.   # Follow with me in approximately 4 weeks with labs.

## 2015-12-03 NOTE — Progress Notes (Signed)
Patient is here for follow up, he is doing ok.

## 2015-12-03 NOTE — Patient Instructions (Signed)
Your potassium level is elevated today.  Please hold your potassium tablets until you see the doctor back in 4 weeks.

## 2015-12-08 ENCOUNTER — Other Ambulatory Visit: Payer: Self-pay

## 2015-12-08 ENCOUNTER — Emergency Department: Payer: Medicare Other

## 2015-12-08 ENCOUNTER — Ambulatory Visit: Payer: Medicare Other

## 2015-12-08 ENCOUNTER — Encounter: Payer: Self-pay | Admitting: Emergency Medicine

## 2015-12-08 ENCOUNTER — Inpatient Hospital Stay
Admission: EM | Admit: 2015-12-08 | Discharge: 2015-12-17 | DRG: 871 | Disposition: A | Discharge status: Skilled Nursing Facility | Payer: Medicare Other | Attending: Internal Medicine | Admitting: Internal Medicine

## 2015-12-08 DIAGNOSIS — Z801 Family history of malignant neoplasm of trachea, bronchus and lung: Secondary | ICD-10-CM

## 2015-12-08 DIAGNOSIS — C7800 Secondary malignant neoplasm of unspecified lung: Secondary | ICD-10-CM | POA: Diagnosis not present

## 2015-12-08 DIAGNOSIS — Z66 Do not resuscitate: Secondary | ICD-10-CM | POA: Diagnosis present

## 2015-12-08 DIAGNOSIS — Z79899 Other long term (current) drug therapy: Secondary | ICD-10-CM

## 2015-12-08 DIAGNOSIS — E119 Type 2 diabetes mellitus without complications: Secondary | ICD-10-CM | POA: Diagnosis present

## 2015-12-08 DIAGNOSIS — Z8249 Family history of ischemic heart disease and other diseases of the circulatory system: Secondary | ICD-10-CM | POA: Diagnosis not present

## 2015-12-08 DIAGNOSIS — Z794 Long term (current) use of insulin: Secondary | ICD-10-CM

## 2015-12-08 DIAGNOSIS — E785 Hyperlipidemia, unspecified: Secondary | ICD-10-CM | POA: Diagnosis present

## 2015-12-08 DIAGNOSIS — Z515 Encounter for palliative care: Secondary | ICD-10-CM | POA: Diagnosis present

## 2015-12-08 DIAGNOSIS — N400 Enlarged prostate without lower urinary tract symptoms: Secondary | ICD-10-CM | POA: Diagnosis present

## 2015-12-08 DIAGNOSIS — K219 Gastro-esophageal reflux disease without esophagitis: Secondary | ICD-10-CM | POA: Diagnosis present

## 2015-12-08 DIAGNOSIS — C3431 Malignant neoplasm of lower lobe, right bronchus or lung: Secondary | ICD-10-CM | POA: Diagnosis present

## 2015-12-08 DIAGNOSIS — C787 Secondary malignant neoplasm of liver and intrahepatic bile duct: Secondary | ICD-10-CM | POA: Diagnosis present

## 2015-12-08 DIAGNOSIS — Z7982 Long term (current) use of aspirin: Secondary | ICD-10-CM

## 2015-12-08 DIAGNOSIS — Z79891 Long term (current) use of opiate analgesic: Secondary | ICD-10-CM

## 2015-12-08 DIAGNOSIS — Z8546 Personal history of malignant neoplasm of prostate: Secondary | ICD-10-CM

## 2015-12-08 DIAGNOSIS — Z7952 Long term (current) use of systemic steroids: Secondary | ICD-10-CM

## 2015-12-08 DIAGNOSIS — Z7189 Other specified counseling: Secondary | ICD-10-CM | POA: Diagnosis not present

## 2015-12-08 DIAGNOSIS — A419 Sepsis, unspecified organism: Principal | ICD-10-CM | POA: Diagnosis present

## 2015-12-08 DIAGNOSIS — R531 Weakness: Secondary | ICD-10-CM

## 2015-12-08 DIAGNOSIS — Z8042 Family history of malignant neoplasm of prostate: Secondary | ICD-10-CM | POA: Diagnosis not present

## 2015-12-08 DIAGNOSIS — E872 Acidosis, unspecified: Secondary | ICD-10-CM

## 2015-12-08 DIAGNOSIS — J189 Pneumonia, unspecified organism: Secondary | ICD-10-CM

## 2015-12-08 DIAGNOSIS — C7931 Secondary malignant neoplasm of brain: Secondary | ICD-10-CM | POA: Diagnosis present

## 2015-12-08 DIAGNOSIS — C61 Malignant neoplasm of prostate: Secondary | ICD-10-CM

## 2015-12-08 DIAGNOSIS — I10 Essential (primary) hypertension: Secondary | ICD-10-CM | POA: Diagnosis present

## 2015-12-08 DIAGNOSIS — C7952 Secondary malignant neoplasm of bone marrow: Secondary | ICD-10-CM | POA: Diagnosis present

## 2015-12-08 DIAGNOSIS — R0602 Shortness of breath: Secondary | ICD-10-CM

## 2015-12-08 DIAGNOSIS — J939 Pneumothorax, unspecified: Secondary | ICD-10-CM

## 2015-12-08 DIAGNOSIS — G8929 Other chronic pain: Secondary | ICD-10-CM | POA: Diagnosis present

## 2015-12-08 DIAGNOSIS — Z87891 Personal history of nicotine dependence: Secondary | ICD-10-CM | POA: Diagnosis not present

## 2015-12-08 DIAGNOSIS — Z7951 Long term (current) use of inhaled steroids: Secondary | ICD-10-CM | POA: Diagnosis not present

## 2015-12-08 DIAGNOSIS — J690 Pneumonitis due to inhalation of food and vomit: Secondary | ICD-10-CM

## 2015-12-08 DIAGNOSIS — R059 Cough, unspecified: Secondary | ICD-10-CM

## 2015-12-08 DIAGNOSIS — J918 Pleural effusion in other conditions classified elsewhere: Secondary | ICD-10-CM | POA: Diagnosis present

## 2015-12-08 DIAGNOSIS — R05 Cough: Secondary | ICD-10-CM

## 2015-12-08 LAB — CBC WITH DIFFERENTIAL/PLATELET
BAND NEUTROPHILS: 1 %
BASOS ABS: 0 10*3/uL (ref 0–0.1)
BLASTS: 0 %
Basophils Relative: 0 %
EOS ABS: 0 10*3/uL (ref 0–0.7)
EOS PCT: 0 %
HCT: 39.6 % — ABNORMAL LOW (ref 40.0–52.0)
Hemoglobin: 12.8 g/dL — ABNORMAL LOW (ref 13.0–18.0)
LYMPHS ABS: 1 10*3/uL (ref 1.0–3.6)
Lymphocytes Relative: 3 %
MCH: 24.4 pg — ABNORMAL LOW (ref 26.0–34.0)
MCHC: 32.3 g/dL (ref 32.0–36.0)
MCV: 75.5 fL — ABNORMAL LOW (ref 80.0–100.0)
METAMYELOCYTES PCT: 0 %
MONOS PCT: 5 %
MYELOCYTES: 0 %
Monocytes Absolute: 1.6 10*3/uL — ABNORMAL HIGH (ref 0.2–1.0)
NEUTROS ABS: 29.2 10*3/uL — AB (ref 1.4–6.5)
Neutrophils Relative %: 91 %
Other: 0 %
PLATELETS: 230 10*3/uL (ref 150–440)
Promyelocytes Absolute: 0 %
RBC: 5.25 MIL/uL (ref 4.40–5.90)
RDW: 17 % — AB (ref 11.5–14.5)
WBC: 31.8 10*3/uL — ABNORMAL HIGH (ref 3.8–10.6)
nRBC: 0 /100 WBC

## 2015-12-08 LAB — HEPATIC FUNCTION PANEL
ALBUMIN: 2.4 g/dL — AB (ref 3.5–5.0)
ALK PHOS: 203 U/L — AB (ref 38–126)
ALT: 51 U/L (ref 17–63)
AST: 43 U/L — AB (ref 15–41)
BILIRUBIN DIRECT: 0.4 mg/dL (ref 0.1–0.5)
BILIRUBIN TOTAL: 0.9 mg/dL (ref 0.3–1.2)
Indirect Bilirubin: 0.5 mg/dL (ref 0.3–0.9)
Total Protein: 7.2 g/dL (ref 6.5–8.1)

## 2015-12-08 LAB — BASIC METABOLIC PANEL
Anion gap: 10 (ref 5–15)
BUN: 58 mg/dL — ABNORMAL HIGH (ref 6–20)
CALCIUM: 9.1 mg/dL (ref 8.9–10.3)
CO2: 24 mmol/L (ref 22–32)
CREATININE: 1.12 mg/dL (ref 0.61–1.24)
Chloride: 106 mmol/L (ref 101–111)
GFR calc non Af Amer: >60 mL/min (ref >60)
Glucose, Bld: 299 mg/dL — ABNORMAL HIGH (ref 65–99)
Potassium: 5.4 mmol/L — ABNORMAL HIGH (ref 3.5–5.1)
SODIUM: 140 mmol/L (ref 135–145)

## 2015-12-08 LAB — MRSA PCR SCREENING: MRSA BY PCR: POSITIVE — AB

## 2015-12-08 LAB — GLUCOSE, CAPILLARY
GLUCOSE-CAPILLARY: 275 mg/dL — AB (ref 65–99)
GLUCOSE-CAPILLARY: 389 mg/dL — AB (ref 65–99)
Glucose-Capillary: 370 mg/dL — ABNORMAL HIGH (ref 65–99)

## 2015-12-08 LAB — LACTIC ACID, PLASMA
Lactic Acid, Venous: 2.5 mmol/L (ref 0.5–1.9)
Lactic Acid, Venous: 2.5 mmol/L (ref 0.5–1.9)

## 2015-12-08 LAB — TROPONIN I: Troponin I: 0.06 ng/mL (ref <0.03)

## 2015-12-08 MED ORDER — ENOXAPARIN SODIUM 40 MG/0.4ML ~~LOC~~ SOLN
40.0000 mg | SUBCUTANEOUS | Status: DC
  Administered 2015-12-08 – 2015-12-09 (×2): 40 mg via SUBCUTANEOUS
  Filled 2015-12-08 (×2): qty 0.4

## 2015-12-08 MED ORDER — SODIUM CHLORIDE 0.9 % IV SOLN: Freq: Once | INTRAVENOUS | Status: DC

## 2015-12-08 MED ORDER — SODIUM CHLORIDE 0.9 % IV SOLN
Freq: Once | INTRAVENOUS | Status: AC
  Administered 2015-12-08: 08:00:00 via INTRAVENOUS

## 2015-12-08 MED ORDER — LEVOFLOXACIN IN D5W 750 MG/150ML IV SOLN
750.0000 mg | Freq: Once | INTRAVENOUS | Status: AC
  Administered 2015-12-08: 750 mg via INTRAVENOUS
  Filled 2015-12-08: qty 150

## 2015-12-08 MED ORDER — ONDANSETRON HCL 4 MG PO TABS: 4.0000 mg | ORAL_TABLET | Freq: Four times a day (QID) | ORAL | Status: DC | PRN

## 2015-12-08 MED ORDER — LOSARTAN POTASSIUM 25 MG PO TABS
25.0000 mg | ORAL_TABLET | Freq: Every day | ORAL | Status: DC
  Administered 2015-12-08 – 2015-12-16 (×8): 25 mg via ORAL
  Filled 2015-12-08 (×9): qty 1

## 2015-12-08 MED ORDER — FUROSEMIDE 10 MG/ML IJ SOLN
40.0000 mg | INTRAMUSCULAR | Status: AC
  Administered 2015-12-08: 40 mg via INTRAVENOUS
  Filled 2015-12-08: qty 4

## 2015-12-08 MED ORDER — METOPROLOL SUCCINATE ER 25 MG PO TB24
25.0000 mg | ORAL_TABLET | Freq: Every day | ORAL | Status: DC
  Administered 2015-12-08 – 2015-12-16 (×7): 25 mg via ORAL
  Filled 2015-12-08 (×9): qty 1

## 2015-12-08 MED ORDER — INSULIN ASPART 100 UNIT/ML ~~LOC~~ SOLN
0.0000 [IU] | Freq: Every day | SUBCUTANEOUS | Status: DC
  Administered 2015-12-08: 5 [IU] via SUBCUTANEOUS
  Filled 2015-12-08: qty 5

## 2015-12-08 MED ORDER — ENSURE ENLIVE PO LIQD
237.0000 mL | Freq: Two times a day (BID) | ORAL | Status: DC
  Administered 2015-12-08 – 2015-12-09 (×2): 237 mL via ORAL

## 2015-12-08 MED ORDER — MUPIROCIN 2 % EX OINT
1.0000 "application " | TOPICAL_OINTMENT | Freq: Two times a day (BID) | CUTANEOUS | Status: AC
  Administered 2015-12-08 – 2015-12-13 (×10): 1 via NASAL
  Filled 2015-12-08: qty 22

## 2015-12-08 MED ORDER — OXYCODONE HCL 5 MG PO TABS
5.0000 mg | ORAL_TABLET | ORAL | Status: DC | PRN
  Administered 2015-12-08 – 2015-12-10 (×2): 5 mg via ORAL
  Filled 2015-12-08 (×2): qty 1

## 2015-12-08 MED ORDER — VANCOMYCIN HCL IN DEXTROSE 1-5 GM/200ML-% IV SOLN
1000.0000 mg | Freq: Once | INTRAVENOUS | Status: AC
  Administered 2015-12-08: 1000 mg via INTRAVENOUS
  Filled 2015-12-08: qty 200

## 2015-12-08 MED ORDER — IPRATROPIUM-ALBUTEROL 0.5-2.5 (3) MG/3ML IN SOLN
3.0000 mL | RESPIRATORY_TRACT | Status: DC | PRN
  Administered 2015-12-14 – 2015-12-16 (×4): 3 mL via RESPIRATORY_TRACT
  Filled 2015-12-08 (×4): qty 3

## 2015-12-08 MED ORDER — ONDANSETRON HCL 4 MG/2ML IJ SOLN: 4.0000 mg | Freq: Four times a day (QID) | INTRAMUSCULAR | Status: DC | PRN

## 2015-12-08 MED ORDER — ACETAMINOPHEN 650 MG RE SUPP: 650.0000 mg | Freq: Four times a day (QID) | RECTAL | Status: DC | PRN

## 2015-12-08 MED ORDER — FLUTICASONE PROPIONATE 50 MCG/ACT NA SUSP
1.0000 | Freq: Every day | NASAL | Status: DC
  Administered 2015-12-08 – 2015-12-16 (×9): 1 via NASAL
  Filled 2015-12-08: qty 16

## 2015-12-08 MED ORDER — GUAIFENESIN ER 600 MG PO TB12
1200.0000 mg | ORAL_TABLET | Freq: Every day | ORAL | Status: DC
  Administered 2015-12-08 – 2015-12-16 (×9): 1200 mg via ORAL
  Filled 2015-12-08 (×12): qty 2

## 2015-12-08 MED ORDER — IPRATROPIUM-ALBUTEROL 0.5-2.5 (3) MG/3ML IN SOLN
3.0000 mL | Freq: Once | RESPIRATORY_TRACT | Status: AC
  Administered 2015-12-08: 3 mL via RESPIRATORY_TRACT
  Filled 2015-12-08: qty 3

## 2015-12-08 MED ORDER — PANTOPRAZOLE SODIUM 40 MG PO TBEC
40.0000 mg | DELAYED_RELEASE_TABLET | Freq: Every day | ORAL | Status: DC
  Administered 2015-12-08 – 2015-12-15 (×8): 40 mg via ORAL
  Filled 2015-12-08 (×8): qty 1

## 2015-12-08 MED ORDER — LORATADINE 10 MG PO TABS
10.0000 mg | ORAL_TABLET | Freq: Every day | ORAL | Status: DC
  Administered 2015-12-08 – 2015-12-16 (×9): 10 mg via ORAL
  Filled 2015-12-08 (×12): qty 1

## 2015-12-08 MED ORDER — TAMSULOSIN HCL 0.4 MG PO CAPS: 0.4000 mg | ORAL_CAPSULE | Freq: Every day | ORAL | Status: DC

## 2015-12-08 MED ORDER — PIPERACILLIN-TAZOBACTAM 3.375 G IVPB
3.3750 g | Freq: Once | INTRAVENOUS | Status: AC
  Administered 2015-12-08: 3.375 g via INTRAVENOUS
  Filled 2015-12-08: qty 50

## 2015-12-08 MED ORDER — ASPIRIN EC 81 MG PO TBEC
81.0000 mg | DELAYED_RELEASE_TABLET | Freq: Every day | ORAL | Status: DC
  Administered 2015-12-08 – 2015-12-10 (×3): 81 mg via ORAL
  Filled 2015-12-08 (×4): qty 1

## 2015-12-08 MED ORDER — FENTANYL 25 MCG/HR TD PT72
25.0000 ug | MEDICATED_PATCH | TRANSDERMAL | Status: DC
  Administered 2015-12-08: 25 ug via TRANSDERMAL
  Filled 2015-12-08: qty 1

## 2015-12-08 MED ORDER — ATORVASTATIN CALCIUM 20 MG PO TABS
40.0000 mg | ORAL_TABLET | Freq: Every day | ORAL | Status: DC
  Administered 2015-12-09 – 2015-12-11 (×3): 40 mg via ORAL
  Filled 2015-12-08 (×5): qty 2

## 2015-12-08 MED ORDER — DEXAMETHASONE 4 MG PO TABS
4.0000 mg | ORAL_TABLET | Freq: Three times a day (TID) | ORAL | Status: DC
  Administered 2015-12-08 – 2015-12-09 (×4): 4 mg via ORAL
  Filled 2015-12-08 (×4): qty 1

## 2015-12-08 MED ORDER — SODIUM CHLORIDE 0.9 % IV SOLN
INTRAVENOUS | Status: DC
  Administered 2015-12-08 (×2): via INTRAVENOUS

## 2015-12-08 MED ORDER — TAMSULOSIN HCL 0.4 MG PO CAPS
0.8000 mg | ORAL_CAPSULE | Freq: Every day | ORAL | Status: DC
  Administered 2015-12-08 – 2015-12-16 (×9): 0.8 mg via ORAL
  Filled 2015-12-08 (×9): qty 2

## 2015-12-08 MED ORDER — ACETAMINOPHEN 325 MG PO TABS
650.0000 mg | ORAL_TABLET | Freq: Four times a day (QID) | ORAL | Status: DC | PRN
  Filled 2015-12-08: qty 2

## 2015-12-08 MED ORDER — CHLORHEXIDINE GLUCONATE CLOTH 2 % EX PADS
6.0000 | MEDICATED_PAD | Freq: Every day | CUTANEOUS | Status: AC
  Administered 2015-12-09 – 2015-12-13 (×5): 6 via TOPICAL

## 2015-12-08 MED ORDER — FERROUS SULFATE 325 (65 FE) MG PO TABS
325.0000 mg | ORAL_TABLET | Freq: Two times a day (BID) | ORAL | Status: DC
  Administered 2015-12-08 – 2015-12-15 (×14): 325 mg via ORAL
  Filled 2015-12-08 (×14): qty 1

## 2015-12-08 MED ORDER — INSULIN GLARGINE 100 UNIT/ML ~~LOC~~ SOLN
20.0000 [IU] | Freq: Every day | SUBCUTANEOUS | Status: DC
  Administered 2015-12-08 – 2015-12-09 (×2): 20 [IU] via SUBCUTANEOUS
  Filled 2015-12-08 (×2): qty 0.2

## 2015-12-08 MED ORDER — PIPERACILLIN-TAZOBACTAM 3.375 G IVPB
3.3750 g | Freq: Three times a day (TID) | INTRAVENOUS | Status: DC
  Administered 2015-12-08 – 2015-12-15 (×21): 3.375 g via INTRAVENOUS
  Filled 2015-12-08 (×21): qty 50

## 2015-12-08 MED ORDER — SODIUM CHLORIDE 0.9% FLUSH
3.0000 mL | Freq: Two times a day (BID) | INTRAVENOUS | Status: DC
  Administered 2015-12-08 – 2015-12-17 (×19): 3 mL via INTRAVENOUS

## 2015-12-08 MED ORDER — VANCOMYCIN HCL IN DEXTROSE 1-5 GM/200ML-% IV SOLN
1000.0000 mg | Freq: Two times a day (BID) | INTRAVENOUS | Status: DC
  Administered 2015-12-08 – 2015-12-09 (×2): 1000 mg via INTRAVENOUS
  Filled 2015-12-08 (×3): qty 200

## 2015-12-08 MED ORDER — MONTELUKAST SODIUM 10 MG PO TABS
10.0000 mg | ORAL_TABLET | Freq: Every day | ORAL | Status: DC
  Administered 2015-12-08 – 2015-12-16 (×9): 10 mg via ORAL
  Filled 2015-12-08 (×9): qty 1

## 2015-12-08 MED ORDER — INSULIN ASPART 100 UNIT/ML ~~LOC~~ SOLN
0.0000 [IU] | Freq: Three times a day (TID) | SUBCUTANEOUS | Status: DC
  Administered 2015-12-08: 9 [IU] via SUBCUTANEOUS
  Administered 2015-12-08: 14:00:00 5 [IU] via SUBCUTANEOUS
  Administered 2015-12-09 (×2): 9 [IU] via SUBCUTANEOUS
  Filled 2015-12-08: qty 5
  Filled 2015-12-08 (×3): qty 9

## 2015-12-08 NOTE — Clinical Social Work Note (Signed)
Clinical Social Work Assessment  Patient Details  Name: [name] MRN: [id number] Date of Birth: 1940-04-13  Date of referral:  12/08/15               Reason for consult:  Discharge Planning (admitted from [facility])                Permission sought to share information with:  Case Manager, Customer service manager, Family Supports Permission granted to share information::  Yes, Verbal Permission Granted  Name::        Agency::  Radiation protection practitioner, SNF  Relationship::  Wife   Contact Information:     Housing/Transportation Living arrangements for the past 2 months:  [place], [place] of Information:  Patient, Medical Team, Case Manager, Facility Patient Interpreter Needed:  None Criminal Activity/Legal Involvement Pertinent to Current Situation/Hospitalization:  No - Comment as needed Significant Relationships:  Other Family Members, Spouse, Community Support Lives with:  Facility Resident Do you feel safe going back to the place where you live?  Yes Need for family participation in patient care:  No (Coment)  Care giving concerns:  No concerns noted at this time. Patient is a current facility resident at [facility]. Patient recently placed in last week.  Plan is for patient to return at discharge and continue skilled treatment.   Social Worker assessment / plan:  LCSW completed assessment and spoke with facility regarding admission. Facility agreeable to accept patient back once medically stable. Wife is main contact and also involved in care and at facility frequently per SNF.  LCSW will follow acutely and assist with transition back to SNF once medically stable.  Will update FL2 .  Plan SNF: at DC  Employment status:  Retired Forensic scientist:  Medicare PT Recommendations:  Not assessed at this time Information / Referral to community resources:  [place]  Patient/Family's Response to care:   Agreeable to plan  Patient/Family's Understanding of and Emotional Response to Diagnosis, Current Treatment, and Prognosis:  Aware of prognosis and current reason for admission.  Open to treatment options and return at DC.  Emotional Assessment Appearance:  Appears stated age Attitude/Demeanor/Rapport:  Other (cooperative, pleasant) Affect (typically observed):  Accepting, Adaptable Orientation:  Oriented to Self, Oriented to Place, Oriented to  Time, Oriented to Situation Alcohol / Substance use:  Not Applicable Psych involvement (Current and /or in the community):  No (Comment)  Discharge Needs  Concerns to be addressed:  No discharge needs identified Readmission within the last 30 days:  No Current discharge risk:  None Barriers to Discharge:  No Barriers Identified, Continued Medical Work up   [place], LCSW 12/08/2015, 11:55 AM

## 2015-12-08 NOTE — Consult Note (Signed)
Patient seen at 7:00 pm on 12/08/2015. In distress due to right upper quadrant pain, Unable to move due to pain, Lethargic, but oriented. He is diffusely edemtous and dyspneic at rest Wife at his bedside. 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 Aletha Halim care, and was slated to begin WBRT this week, but could not make it due to a fall and subsequent admission to rehab. He is now back with worsening SOB He had done poorly with chemo and his ECOG PS currently would deter any treatment options includin Radiation to the brain metastasis. He is terminally ill and is hospice appropriate. g I had an extensive discussion with his wife in the unit,. She understands the grim prognosis and agrees that focus should comfort care, however, she says she cannot care for him at home. She prefers a hospice home.  He just received Oxycodone for pain, I have recommended that we give inj morphine for immediate pain releief, she prefers that we try fentanyl, understands that Fentanyl does not work right away. I have instructed the nurse to contact me if he continues to be in pain despite oxycodone. I have also ordered a dose of lasix to help improve edema. Continue oxygen supplementation and antibiotics.  Discusses the plan with Dr. B who is in agreement with hospice referral. Total face to face time spent was 45 minutes, of which 40 minutes were counseling.

## 2015-12-08 NOTE — ED Provider Notes (Signed)
[facility] Emergency Department Provider Note        Time seen: ----------------------------------------- 7:03 AM on 12/08/2015 -----------------------------------------    I have reviewed the triage vital signs and the nursing notes.   HISTORY  Chief Complaint Shortness of Breath    HPI [name] is a 75 y.o. male who presents to the ER brought from [place] for shortness of breath. Patient was 91-90% on room air. Patient states he has a rattle in his lungs and has had a productive cough over the last day. Cough has been productive of yellow sputum. Patient was in [place] after GI bleed and a skin abscess that he had had. He also has been diagnosed with metastatic cancer with metastases to his brain lungs and liver. They're currently is not any treatment schedule other than palliative radiation. Patient denies any pain right now, just feels short of breath. Family notes he was struggling to swallow yesterday.   Past Medical History:  Diagnosis Date  . Arthritis   . Blood transfusion without reported diagnosis    December 28, 2014 8 units   . Cancer ([place])    liver   . GERD (gastroesophageal reflux disease)   . Gout   . History of blood transfusion    Februrary 2017 4units   . Hyperlipidemia   . Hypertension   . Lung cancer ([place])   . 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 Prostate cancer ([place]) 03/2013   had seed implant and radiation for 5 weeks  . Type 2 diabetes mellitus ([place])   . Wears glasses     Patient Active Problem List   Diagnosis Date Noted  . DNR (do not resuscitate) 11/19/2015  . Palliative care by specialist 11/19/2015  . Brain metastases ([place]) 11/19/2015  . GI bleed 11/18/2015  . Primary cancer of right lower lobe of lung ([place]) 10/30/2015  . Generalized weakness 10/30/2015  . Metastases to the liver ([place]) 10/20/2015  . Multiple lung nodules 10/20/2015  . Iron deficiency anemia due to  chronic blood loss 10/20/2015  . Prostate cancer ([place]) 10/20/2015  . Blood loss anemia 03/17/2015  . Acute blood loss anemia 12/28/2014  . Hematuria, gross 12/28/2014  . Pneumonia 12/28/2014  . Type 2 diabetes mellitus ([place]) 03/10/2013  . hypercholesterolemia 03/10/2013  . hypertension 03/10/2013  . 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 INGUINAL HERNIA 08/15/2008    Past Surgical History:  Procedure Laterality Date  . COLONOSCOPY  2010  . CYSTOSCOPY N/A 07/04/2013   Procedure: CYSTOSCOPY FLEXIBLE;  Surgeon: Claybon Jabs, MD;  Location: [facility];  Service: Urology;  Laterality: N/A;  . INGUINAL HERNIA REPAIR Left 2005  . LIVER BIOPSY Right 11/02/2015  . PROSTATE BIOPSY    . RADIOACTIVE SEED IMPLANT N/A 07/04/2013   Procedure: RADIOACTIVE SEED IMPLANT;  Surgeon: Claybon Jabs, MD;  Location: [facility];  Service: Urology;  Laterality: N/A;  . REPAIR CHRONIC INCARCERATED RECURRENT LEFT INGUINAL HERNIA  10-23-2008  . TRANSURETHRAL RESECTION OF BLADDER TUMOR N/A 01/02/2015   Procedure: Cystoscopy clot evalucation and fulgration;  Surgeon: Kathie Rhodes, MD;  Location: [facility];  Service: Urology;  Laterality: N/A;    Allergies Patient has no known allergies.  Social History Social History  Substance Use Topics  . Smoking status: Former Smoker    Packs/day: 1.00    Years: 30.00    Types:  Cigarettes    Quit date: 02/01/2008  . Smokeless tobacco: Never Used  . Alcohol use No    Review of Systems Constitutional: Negative for fever. Cardiovascular: Negative for chest pain. Respiratory: Positive for shortness of breath, cough Gastrointestinal: Negative for abdominal pain, vomiting and diarrhea. Genitourinary: Negative for dysuria. Musculoskeletal: Negative for back pain. Skin: Negative for rash. Neurological: Negative for headaches, positive for generalized weakness  10-point ROS otherwise  negative.  ____________________________________________   PHYSICAL EXAM:  VITAL SIGNS: ED Triage Vitals  Enc Vitals Group     BP 12/08/15 0630 (!) 143/78     Pulse Rate 12/08/15 0630 (!) 104     Resp 12/08/15 0630 18     Temp 12/08/15 0636 98.3 F (36.8 C)     Temp Source 12/08/15 0636 Oral     SpO2 12/08/15 0630 93 %     Weight 12/08/15 0637 223 lb (101.2 kg)     Height 12/08/15 0637 6' (1.829 m)     Head Circumference --      Peak Flow --      Pain Score --      Pain Loc --      Pain Edu? --      Excl. in [place]? --     Constitutional: Alert and oriented. Mild distress Eyes: Conjunctivae are normal. PERRL. Normal extraocular movements. ENT   Head: Normocephalic and atraumatic.   Nose: No congestion/rhinnorhea.   Mouth/Throat: Mucous membranes are moist.   Neck: No stridor. Cardiovascular: Normal rate, regular rhythm. No murmurs, rubs, or gallops. Respiratory: Normal respiratory effort with significant rales and rhonchi, left greater than right Gastrointestinal: Soft and nontender. Normal bowel sounds Musculoskeletal: Nontender, mild edema Neurologic:  Normal speech and language. Generalized weakness, nothing focal Skin:  Skin is warm, dry and intact. No rash noted. Psychiatric: Mood and affect are normal. Speech and behavior are normal.  ____________________________________________  EKG: Interpreted by me. Sinus tachycardia with rate of 104 bpm, normal PR interval, normal QRS, normal QT, normal axis.  ____________________________________________  ED COURSE:  Pertinent labs & imaging results that were available during my care of the patient were reviewed by me and considered in my medical decision making (see chart for details). Clinical Course   Patient with likely aspiration pneumonia. We will assess with labs and imaging.  Procedures ____________________________________________   LABS (pertinent positives/negatives)  Labs Reviewed  CBC WITH  DIFFERENTIAL/PLATELET - Abnormal; Notable for the following:       Result Value   WBC 31.8 (*)    Hemoglobin 12.8 (*)    HCT 39.6 (*)    MCV 75.5 (*)    MCH 24.4 (*)    RDW 17.0 (*)    Neutro Abs 29.2 (*)    Monocytes Absolute 1.6 (*)    All other components within normal limits  LACTIC ACID, PLASMA - Abnormal; Notable for the following:    Lactic Acid, Venous 2.5 (*)    All other components within normal limits  HEPATIC FUNCTION PANEL - Abnormal; Notable for the following:    Albumin 2.4 (*)    AST 43 (*)    Alkaline Phosphatase 203 (*)    All other components within normal limits  CULTURE, BLOOD (ROUTINE X 2)  CULTURE, BLOOD (ROUTINE X 2)  LACTIC ACID, PLASMA  CRITICAL CARE Performed by: Earleen Newport   Total critical care time: 30 minutes  Critical care time was exclusive of separately billable procedures and treating other patients.  Critical care  was necessary to treat or prevent imminent or life-threatening deterioration.  Critical care was time spent personally by me on the following activities: development of treatment plan with patient and/or surrogate as well as nursing, discussions with consultants, evaluation of patient's response to treatment, examination of patient, obtaining history from patient or surrogate, ordering and performing treatments and interventions, ordering and review of laboratory studies, ordering and review of radiographic studies, pulse oximetry and re-evaluation of patient's condition.   RADIOLOGY Images were viewed by me  Chest x-ray IMPRESSION: Increased consolidation in the right mid and lower lung zones extending into the posterior segment of the right upper lobe. There is moderate pleural effusion on the right as well as elevation the right hemidiaphragm. The known mass in the right base is obscured by surrounding consolidation. On the left, there is atelectasis in the left base. The nodular opacity seen on radiography  throughout the lungs are not appreciable by radiography. There is aortic atherosclerosis. Subcarinal adenopathy is noted. Adenopathy in other areas is much better seen on recent CT than on this current radiographic examination. ____________________________________________  FINAL ASSESSMENT AND PLAN  Pneumonia, likely aspiration pneumonia  Plan: Patient with labs and imaging as dictated above. Patient presented with abnormal breath sounds, dyspnea and cough with sputum production. Patient does appear to have developed pneumonia. This is likely aspiration pneumonia given his recent choking episodes. He may or any been developing pneumonia so he'll be covered for HCAP. At this point his blood pressure is stable, I will discuss with the hospitalist for admission. We have ordered vancomycin, Zosyn and Levaquin. He's been given fluids for lactic acidosis. He is currently stable for admission at this time, does not require the ICU.   Earleen Newport, MD   Note: This dictation was prepared with Dragon dictation. Any transcriptional errors that result from this process are unintentional    Earleen Newport, MD 12/08/15 [phone]

## 2015-12-08 NOTE — ED Notes (Signed)
Patient transported to X-ray 

## 2015-12-08 NOTE — Progress Notes (Signed)
Initial Nutrition Assessment  DOCUMENTATION CODES:   Not applicable  INTERVENTION:  1. Ensure Enlive po BID, each supplement provides 350 kcal and 20 grams of protein 2. Encourage PO intake, patient gets assistance from wife with PO intake but overall, seems to need significant help and coaching to consume food at this time.  NUTRITION DIAGNOSIS:   Inadequate oral intake related to poor appetite, chronic illness, cancer and cancer related treatments as evidenced by per patient/family report.  GOAL:   Patient will meet greater than or equal to 90% of their needs  MONITOR:   PO intake, I & O's, Labs, Weight trends, Supplement acceptance  REASON FOR ASSESSMENT:   Malnutrition Screening Tool    ASSESSMENT:   [name]  is a 75 y.o. male with a known history of Stage IV lung cancer followed with Dr.B, and planning whole brain radiation who is presenting from [facility] with shortness of breath  Spoke with Mr. Petrosky wife at bedside.  She admits to patient normally consuming 3 large meals per day, was eating well prior to fall at [facility] during rehab. Patient's weight appeared to be stated, was entered as 101.2kg at the past 3 entries. Utilized bed scale, without taring for blankets, etc - weight come up to be 172# Wife stated "that seems low." Weighed again, came up as 173.1# - unsure of accuracy of previous weights  Wife continued to deny weight loss. Unsure of gradual weight loss as patient's weight appears stated since 10/09 He was eating during my visit, seemed to be eating well. Of note: patient requires plastic utensils  Labs and medications reviewed: CBG 275 Decadron, Iron NS @ 168m/hr  Nutrition-Focused physical exam completed. Findings are moderate fat depletion, moderate muscle depletion, and no edema.      Diet Order:  Diet Heart Room service appropriate? Yes; Fluid consistency: Thin  Skin:  Reviewed, no issues  Last BM:   10/22  Height:   Ht Readings from Last 1 Encounters:  12/08/15 6' (1.829 m)    Weight:   Wt Readings from Last 1 Encounters:  12/08/15 172 lb (78 kg)    Ideal Body Weight:  80.9 kg  BMI:  Body mass index is 23.33 kg/m.  Estimated Nutritional Needs:   Kcal:  [address](30-35 cal/kg measured bed weight)  Protein:  101-132 (1.3-1.7g/kg)  Fluid:  >/= 2L  EDUCATION NEEDS:   No education needs identified at this time  WSatira Anis Imajean Mcdermid, MS, RD LDN Inpatient Clinical Dietitian Pager [phone]

## 2015-12-08 NOTE — ED Triage Notes (Signed)
Per EMS patient called out with SOB, patient was 91-92% on room air.  Pt states he has  "rattle" in his lungs, and has a productive cough with yellow sputum, scant in quantity.  Pt is ST at [place] triage and in NAD.

## 2015-12-08 NOTE — H&P (Signed)
[place] [facility] [place] NAME: [name]    MR#:  [id number]  DATE OF BIRTH:  21-Nov-1940   DATE OF ADMISSION:  12/08/2015  PRIMARY CARE PHYSICIAN: Idelle Crouch, MD   REQUESTING/REFERRING PHYSICIAN: [place]  CHIEF COMPLAINT:   Chief Complaint  Patient presents with  . Shortness of Breath    HISTORY OF PRESENT ILLNESS:  [name]  is a 75 y.o. male with a known history of Stage IV lung cancer followed with Dr.B, and planning whole brain radiation who is presenting from [facility] with shortness of breath. Patient's wife is at bedside and provides most of the history  Recently discharged from [facility] to [facility] for rehabilitation, he suffered a mechanical fall about a week ago with no obvious trauma but has been complaining of intermittent pain in the right chest since the incident. Now for the last 2 days duration has been experiencing cough and shortness of breath. No fevers chills, positive generalized weakness  Emergency department course: Requiring oxygen and ED to maintain oxygen saturations, noted slight increased work of breathing, pneumonia found on chest x-ray  PAST MEDICAL HISTORY:   Past Medical History:  Diagnosis Date  . Arthritis   . Blood transfusion without reported diagnosis    December 28, 2014 8 units   . Cancer ([place])    liver   . GERD (gastroesophageal reflux disease)   . Gout   . History of blood transfusion    Februrary 2017 4units   . Hyperlipidemia   . Hypertension   . Lung cancer ([place])   . 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 Prostate cancer ([place]) 03/2013   had seed implant and radiation for 5 weeks  . Type 2 diabetes mellitus ([place])   . Wears glasses     PAST SURGICAL HISTORY:   Past Surgical History:  Procedure Laterality Date  . COLONOSCOPY  2010  . CYSTOSCOPY N/A 07/04/2013   Procedure: CYSTOSCOPY FLEXIBLE;  Surgeon: Claybon Jabs, MD;   Location[facility];  Service: Urology;  Laterality: N/A;  . INGUINAL HERNIA REPAIR Left 2005  . LIVER BIOPSY Right 11/02/2015  . PROSTATE BIOPSY    . RADIOACTIVE SEED IMPLANT N/A 07/04/2013   Procedure: RADIOACTIVE SEED IMPLANT;  Surgeon: Claybon Jabs, MD;  Location: [facility];  Service: Urology;  Laterality: N/A;  . REPAIR CHRONIC INCARCERATED RECURRENT LEFT INGUINAL HERNIA  10-23-2008  . TRANSURETHRAL RESECTION OF BLADDER TUMOR N/A 01/02/2015   Procedure: Cystoscopy clot evalucation and fulgration;  Surgeon: Kathie Rhodes, MD;  Location: [facility];  Service: Urology;  Laterality: N/A;    SOCIAL HISTORY:   Social History  Substance Use Topics  . Smoking status: Former Smoker    Packs/day: 1.00    Years: 30.00    Types: Cigarettes    Quit date: 02/01/2008  . Smokeless tobacco: Never Used  . Alcohol use No    FAMILY HISTORY:   Family History  Problem Relation Age of Onset  . Cancer Mother     ovarian  . Heart disease Father   . Cancer Sister     breast  . Breast cancer Sister   . Cancer Brother     lung  . Cancer Brother     prostate  . Prostate cancer Brother   . Breast cancer Sister   . Lung cancer Brother   . Lung cancer Brother     DRUG ALLERGIES:  No Known Allergies  REVIEW OF SYSTEMS:  REVIEW OF SYSTEMS:  CONSTITUTIONAL: Denies fevers, chills, Positive fatigue, weakness.  EYES: Denies blurred vision, double vision, or eye pain.  EARS, NOSE, THROAT: Denies tinnitus, ear pain, hearing loss.  RESPIRATORY: Positive cough, shortness of breath, denies wheezing  CARDIOVASCULAR: Denies chest pain, palpitations, edema.  GASTROINTESTINAL: Denies nausea, vomiting, diarrhea, abdominal pain.  GENITOURINARY: Denies dysuria, hematuria.  ENDOCRINE: Denies nocturia or thyroid problems. HEMATOLOGIC AND LYMPHATIC: Denies easy bruising or bleeding.  SKIN: Denies rash or lesions.  MUSCULOSKELETAL: Denies pain in neck, back, shoulder, knees, hips,  or further arthritic symptoms.  NEUROLOGIC: Denies paralysis, paresthesias.  PSYCHIATRIC: Denies anxiety or depressive symptoms. Otherwise full review of systems performed by me is negative.   MEDICATIONS AT HOME:   Prior to Admission medications   Medication Sig Start Date End Date Taking? Authorizing Provider  albuterol (PROVENTIL HFA;VENTOLIN HFA) 108 (90 Base) MCG/ACT inhaler Inhale 1-2 puffs into the lungs every 6 (six) hours as needed.  09/11/15  Yes Historical Provider, MD  aspirin EC 81 MG tablet Take 81 mg by mouth.   Yes Historical Provider, MD  atorvastatin (LIPITOR) 40 MG tablet Take 40 mg by mouth daily at 6 PM.  11/27/15  Yes Historical Provider, MD  dexamethasone (DECADRON) 4 MG tablet Take 1 tablet (4 mg total) by mouth 3 (three) times daily. 11/23/15  Yes Vaughan Basta, MD  ferrous sulfate 325 (65 FE) MG tablet Take 1 tablet (325 mg total) by mouth 2 (two) times daily with a meal. 03/20/15  Yes Lavina Hamman, MD  fexofenadine (ALLEGRA) 180 MG tablet Take 180 mg by mouth daily.   Yes Historical Provider, MD  Fluticasone Propionate (FLONASE NA) Place 1 spray into the nose daily.    Yes Historical Provider, MD  furosemide (LASIX) 20 MG tablet Take 20 mg by mouth daily.   Yes Historical Provider, MD  guaiFENesin (MUCINEX) 600 MG 12 hr tablet Take 1,200 mg by mouth daily.    Yes Historical Provider, MD  insulin glargine (LANTUS) 100 UNIT/ML injection Inject 0.2 mLs (20 Units total) into the skin daily. 11/24/15  Yes Vaughan Basta, MD  insulin NPH Human (HUMULIN N,NOVOLIN N) 100 UNIT/ML injection Inject into the skin. Sliding scale 11/13/15 12/13/15 Yes Historical Provider, MD  losartan (COZAAR) 25 MG tablet Take 25 mg by mouth daily.  03/17/15  Yes Historical Provider, MD  metFORMIN (GLUCOPHAGE) 1000 MG tablet Take 1,000 mg by mouth 2 (two) times daily with a meal.  02/28/13  Yes Historical Provider, MD  metoprolol succinate (TOPROL-XL) 25 MG 24 hr tablet Take 1 tablet  (25 mg total) by mouth daily. 11/21/15  Yes Srikar Sudini, MD  montelukast (SINGULAIR) 10 MG tablet Take 1 tablet by mouth at bedtime.  10/19/15  Yes Historical Provider, MD  oxyCODONE (ROXICODONE) 5 MG immediate release tablet Take 1 tablet (5 mg total) by mouth every 6 (six) hours as needed for severe pain. 11/20/15  Yes Srikar Sudini, MD  pantoprazole (PROTONIX) 40 MG tablet Take 1 tablet (40 mg total) by mouth daily. 11/24/15  Yes Vaughan Basta, MD  tamsulosin (FLOMAX) 0.4 MG CAPS capsule Take 1 capsule (0.4 mg total) by mouth daily after supper. Patient taking differently: Take 0.8 mg by mouth daily after supper.  03/20/15  Yes Lavina Hamman, MD  potassium chloride (K-DUR) 10 MEQ tablet Take 1 tablet by mouth 2 (two) times daily. 10/18/15   Historical Provider, MD      VITAL SIGNS:  Blood  pressure 133/75, pulse (!) 102, temperature 98.3 F (36.8 C), temperature source Oral, resp. rate 17, height 6' (1.829 m), weight 101.2 kg (223 lb), SpO2 98 %.  PHYSICAL EXAMINATION:  VITAL SIGNS: Vitals:   12/08/15 0636 12/08/15 0730  BP: (!) 143/78 133/75  Pulse: (!) 106 (!) 102  Resp: (!) 25 17  Temp: 98.3 F (36.8 C)    GENERAL:75 y.o.male currently in no acute distress.  HEAD: Normocephalic, atraumatic.  EYES: Pupils equal, round, reactive to light. Extraocular muscles intact. No scleral icterus.  MOUTH: Moist mucosal membrane. Dentition intact. No abscess noted.  EAR, NOSE, THROAT: Clear without exudates. No external lesions.  NECK: Supple. No thyromegaly. No nodules. No JVD.  PULMONARY: Diffuse rhonchi right greater than left without wheeze tachypnea No use of accessory muscles, Good respiratory effort. good air entry bilaterally CHEST: Nontender to palpation.  CARDIOVASCULAR: S1 and S2. Regular rate and rhythm. No murmurs, rubs, or gallops. No edema. Pedal pulses 2+ bilaterally.  GASTROINTESTINAL: Soft, nontender, nondistended. No masses. Positive bowel sounds. No  hepatosplenomegaly.  MUSCULOSKELETAL: No swelling, clubbing, or edema. Range of motion full in all extremities.  NEUROLOGIC: Cranial nerves II through XII are intact. No gross focal neurological deficits. Sensation intact. Reflexes intact.  SKIN: No ulceration, lesions, rashes, or cyanosis. Skin warm and dry. Turgor intact.  PSYCHIATRIC: Mood, affect within normal limits. The patient is awake, alert and oriented x 3. Insight, judgment intact.    LABORATORY PANEL:   CBC  Recent Labs Lab 12/08/15 0639  WBC 31.8*  HGB 12.8*  HCT 39.6*  PLT 230   ------------------------------------------------------------------------------------------------------------------  Chemistries   Recent Labs Lab 12/03/15 1012 12/08/15 0715  NA 131*  --   K 5.8*  --   CL 99*  --   CO2 20*  --   GLUCOSE 333*  --   BUN 36*  --   CREATININE 1.00  --   CALCIUM 9.0  --   AST 57* 43*  ALT 63 51  ALKPHOS 213* 203*  BILITOT 0.8 0.9   ------------------------------------------------------------------------------------------------------------------  Cardiac Enzymes No results for input(s): TROPONINI in the last 168 hours. ------------------------------------------------------------------------------------------------------------------  RADIOLOGY:  Dg Chest 2 View  Result Date: 12/08/2015 CLINICAL DATA:  Shortness of breath and cough.  Known lung carcinoma EXAM: CHEST  2 VIEW COMPARISON:  November 09, 2015 chest radiograph; chest CT October 12, 2015 FINDINGS: The known mass in the right base is not well distinguished on this study. There is consolidation throughout the right mid and lower lung zones with moderate effusion on the right. There is also elevation of the right hemidiaphragm. There is atelectatic change in the left base. The nodular opacities noted throughout the lungs on CT are not appreciable by radiography. Heart is upper normal in size with pulmonary vascularity within normal limits.  Increased opacity in the sub- carinal region is most likely due to adenopathy. Adenopathy is much better seen on recent CT examination of by radiography. There is atherosclerotic calcification aorta. No blastic or lytic bone lesions are evident. There is degenerative change in both shoulders. IMPRESSION: Increased consolidation in the right mid and lower lung zones extending into the posterior segment of the right upper lobe. There is moderate pleural effusion on the right as well as elevation the right hemidiaphragm. The known mass in the right base is obscured by surrounding consolidation. On the left, there is atelectasis in the left base. The nodular opacity seen on radiography throughout the lungs are not appreciable by radiography. There is  aortic atherosclerosis. Subcarinal adenopathy is noted. Adenopathy in other areas is much better seen on recent CT than on this current radiographic examination. Electronically Signed   By: Lowella Grip III M.D.   On: 12/08/2015 07:18    EKG:   Orders placed or performed in visit on 12/08/15  . EKG 12-Lead  . EKG 12-Lead    IMPRESSION AND PLAN:   75 year old African-American gentleman history of stage IV lung cancer with known metastasis who is presenting with shortness of breath  1.Sepsis, meeting septic criteria by heart rate, leukocytosis, respiratory rate present on arrival. Source healthcare associated pneumonia Panculture. Broad-spectrum antibiotics including vancomycin/Zosyn and taper antibiotics when culture data returns.  Has received a 30 mL/kg IV fluid bolus. Continue IV fluid hydration to keep mean arterial pressure greater than 65. may require pressor therapy if blood pressure worsens. We will repeat lactic acid if the initial is greater than 2.2. Breathing treatments, oxygen  2. Stage IV lung cancer we'll consult oncology apparently was patient was scheduled for radiation treatments today 3. Type 2 diabetes insulin requiring continue  basal insulin at sliding scale coverage 4. Essential hypertension continue home medications    All the records are reviewed and case discussed with ED provider. Management plans discussed with the patient, family and they are in agreement.  CODE STATUS: Full as discussed with patient's family  TOTAL TIME TAKING CARE OF THIS PATIENT: 33 minutes.    [name],  Karenann Cai.D on 12/08/2015 at 9:45 AM  Between 7am to 6pm - Pager - [phone]  After 6pm: House Pager: - [phone]  [facility]  Office  [phone]  CC: Primary care physician; Idelle Crouch, MD

## 2015-12-08 NOTE — ED Notes (Signed)
Dr. Jimmye Norman notified of critical lactic

## 2015-12-08 NOTE — ED Notes (Signed)
Pt placed on 2L [place]

## 2015-12-08 NOTE — Progress Notes (Signed)
   [place] [facility] [place] Hospital Day: 0 days [name] is a 75 y.o. male presenting with Shortness of Breath .   Advance care planning discussed with patient  with additional Family at bedside (wife). All questions in regards to overall condition and expected prognosis answered. The decision was made to change current code status  CODE STATUS: full Time spent: 18 minutes  Wife states has paperwork indicating full code, NOT DNR as previously listed

## 2015-12-09 ENCOUNTER — Ambulatory Visit: Payer: Medicare Other

## 2015-12-09 ENCOUNTER — Inpatient Hospital Stay: Payer: Medicare Other

## 2015-12-09 DIAGNOSIS — C787 Secondary malignant neoplasm of liver and intrahepatic bile duct: Secondary | ICD-10-CM

## 2015-12-09 DIAGNOSIS — G893 Neoplasm related pain (acute) (chronic): Secondary | ICD-10-CM

## 2015-12-09 DIAGNOSIS — R0602 Shortness of breath: Secondary | ICD-10-CM

## 2015-12-09 DIAGNOSIS — C3431 Malignant neoplasm of lower lobe, right bronchus or lung: Secondary | ICD-10-CM

## 2015-12-09 DIAGNOSIS — Z79899 Other long term (current) drug therapy: Secondary | ICD-10-CM

## 2015-12-09 DIAGNOSIS — Z7189 Other specified counseling: Secondary | ICD-10-CM

## 2015-12-09 DIAGNOSIS — J690 Pneumonitis due to inhalation of food and vomit: Secondary | ICD-10-CM

## 2015-12-09 DIAGNOSIS — C7800 Secondary malignant neoplasm of unspecified lung: Secondary | ICD-10-CM

## 2015-12-09 DIAGNOSIS — Z515 Encounter for palliative care: Secondary | ICD-10-CM

## 2015-12-09 DIAGNOSIS — C7931 Secondary malignant neoplasm of brain: Secondary | ICD-10-CM

## 2015-12-09 LAB — BASIC METABOLIC PANEL
Anion gap: 10 (ref 5–15)
BUN: 50 mg/dL — AB (ref 6–20)
CHLORIDE: 107 mmol/L (ref 101–111)
CO2: 24 mmol/L (ref 22–32)
Calcium: 8.2 mg/dL — ABNORMAL LOW (ref 8.9–10.3)
Creatinine, Ser: 1.27 mg/dL — ABNORMAL HIGH (ref 0.61–1.24)
GFR calc Af Amer: >60 mL/min (ref >60)
GFR, EST NON AFRICAN AMERICAN: 53 mL/min — AB (ref >60)
GLUCOSE: 369 mg/dL — AB (ref 65–99)
POTASSIUM: 4.8 mmol/L (ref 3.5–5.1)
Sodium: 141 mmol/L (ref 135–145)

## 2015-12-09 LAB — HEMOGLOBIN A1C
HEMOGLOBIN A1C: 8.8 % — AB (ref 4.8–5.6)
Mean Plasma Glucose: 206 mg/dL

## 2015-12-09 LAB — CBC
HEMATOCRIT: 34.7 % — AB (ref 40.0–52.0)
Hemoglobin: 11.1 g/dL — ABNORMAL LOW (ref 13.0–18.0)
MCH: 24.5 pg — AB (ref 26.0–34.0)
MCHC: 31.9 g/dL — AB (ref 32.0–36.0)
MCV: 76.8 fL — AB (ref 80.0–100.0)
Platelets: 172 10*3/uL (ref 150–440)
RBC: 4.52 MIL/uL (ref 4.40–5.90)
RDW: 18.2 % — AB (ref 11.5–14.5)
WBC: 20.3 10*3/uL — ABNORMAL HIGH (ref 3.8–10.6)

## 2015-12-09 LAB — LACTIC ACID, PLASMA
Lactic Acid, Venous: 2.3 mmol/L (ref 0.5–1.9)
Lactic Acid, Venous: 2.2 mmol/L (ref 0.5–1.9)

## 2015-12-09 LAB — GLUCOSE, CAPILLARY
GLUCOSE-CAPILLARY: 283 mg/dL — AB (ref 65–99)
Glucose-Capillary: 372 mg/dL — ABNORMAL HIGH (ref 65–99)
Glucose-Capillary: 366 mg/dL — ABNORMAL HIGH (ref 65–99)
Glucose-Capillary: 272 mg/dL — ABNORMAL HIGH (ref 65–99)

## 2015-12-09 MED ORDER — SODIUM CHLORIDE 0.9 % IV SOLN
INTRAVENOUS | Status: DC
  Administered 2015-12-09: 06:00:00 via INTRAVENOUS

## 2015-12-09 MED ORDER — ENSURE ENLIVE PO LIQD
237.0000 mL | Freq: Three times a day (TID) | ORAL | Status: DC
  Administered 2015-12-09: 237 mL via ORAL

## 2015-12-09 MED ORDER — GLUCERNA SHAKE PO LIQD
237.0000 mL | Freq: Three times a day (TID) | ORAL | Status: DC
  Administered 2015-12-09 – 2015-12-16 (×19): 237 mL via ORAL

## 2015-12-09 MED ORDER — INSULIN GLARGINE 100 UNIT/ML ~~LOC~~ SOLN
28.0000 [IU] | Freq: Every day | SUBCUTANEOUS | Status: DC
  Administered 2015-12-10 – 2015-12-12 (×3): 28 [IU] via SUBCUTANEOUS
  Filled 2015-12-09 (×3): qty 0.28

## 2015-12-09 MED ORDER — IOPAMIDOL (ISOVUE-300) INJECTION 61%
75.0000 mL | Freq: Once | INTRAVENOUS | Status: AC | PRN
  Administered 2015-12-09: 16:00:00 75 mL via INTRAVENOUS

## 2015-12-09 MED ORDER — INSULIN ASPART 100 UNIT/ML ~~LOC~~ SOLN
0.0000 [IU] | Freq: Three times a day (TID) | SUBCUTANEOUS | Status: DC
  Administered 2015-12-09: 8 [IU] via SUBCUTANEOUS
  Administered 2015-12-10: 13:00:00 5 [IU] via SUBCUTANEOUS
  Administered 2015-12-10: 18:00:00 3 [IU] via SUBCUTANEOUS
  Administered 2015-12-10: 8 [IU] via SUBCUTANEOUS
  Administered 2015-12-11: 3 [IU] via SUBCUTANEOUS
  Filled 2015-12-09 (×2): qty 8
  Filled 2015-12-09: qty 5
  Filled 2015-12-09 (×2): qty 3

## 2015-12-09 MED ORDER — VANCOMYCIN HCL 10 G IV SOLR
1250.0000 mg | INTRAVENOUS | Status: DC
  Administered 2015-12-09 – 2015-12-13 (×6): 1250 mg via INTRAVENOUS
  Filled 2015-12-09 (×7): qty 1250

## 2015-12-09 MED ORDER — INSULIN ASPART 100 UNIT/ML ~~LOC~~ SOLN
0.0000 [IU] | Freq: Every day | SUBCUTANEOUS | Status: DC
  Administered 2015-12-09 – 2015-12-10 (×2): 3 [IU] via SUBCUTANEOUS
  Filled 2015-12-09 (×2): qty 3

## 2015-12-09 MED ORDER — DEXAMETHASONE SODIUM PHOSPHATE 4 MG/ML IJ SOLN
4.0000 mg | Freq: Four times a day (QID) | INTRAMUSCULAR | Status: DC
  Administered 2015-12-09 – 2015-12-13 (×14): 4 mg via INTRAVENOUS
  Filled 2015-12-09 (×15): qty 1

## 2015-12-09 MED ORDER — DOCUSATE SODIUM 100 MG PO CAPS
100.0000 mg | ORAL_CAPSULE | Freq: Two times a day (BID) | ORAL | Status: DC
  Administered 2015-12-09 – 2015-12-16 (×15): 100 mg via ORAL
  Filled 2015-12-09 (×15): qty 1

## 2015-12-09 NOTE — Consult Note (Signed)
Consultation Note Date: 12/09/2015   Patient Name: [name]  DOB: Nov 20, 1940  MRN: [id number]  Age / Sex: 75 y.o., male  PCP: Idelle Crouch, MD Referring Physician: Dustin Flock, MD  Reason for Consultation: Establishing goals of care  HPI/Patient Profile: 75 y.o. 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 Recently discharged from [facility] to rehab at [facility]. He had a mechanical fall one week ago with no obvious trauma but has had intermittent right chest pain since the fall. In ED, chest x-ray revealed pneumonia. Patient was septic. Now on vancomycin, zosyn, and IVF. Patient has been seen by Dr. Rogue Bussing and was to begin WBRT this week but unable to due to fall/admission. He has done poorly with chemotherapy. Per Dr. Sherrine Maples, the patient is terminally ill and discussed this with wife yesterday, 11/7. Palliative medicine consultation for goals of care.   Clinical Assessment and Goals of Care: Met with patient at bedside. Alert, oriented, and sitting up in bed eating breakfast. Patient denies pain or discomfort. Introduced palliative care. Patient withdrawn with one word answers. He is ok with me calling his wife to discuss his medical care.   Detailed conversation with wife, Jan, via telephone. Introduced palliative care. Initially asked about her husband's work and interests. 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 She tells me he is also a very laid back individual and enjoys spending time alone watching tv and sports. She noticed he started "slowing down" in 2016 but he was not diagnosed with cancer until recently in September. She  seems to have a good understanding that he has cancer in multiple areas (lung, brain, liver) and this is progressive and irreversible. She understands that the chemotherapy and radiation (that was to take place this week) was for palliative reasons and not for cure. She spoke with the oncologist last night and understands that chemotherapy and radiation would be more of a harm than good to him.   Although she has a good understanding that his prognosis is poor, at this point, she still wants "everything done possible" to keep him alive, including FULL code. She feels that his pain is under control with the patch and prn PO medication and that he shouldn't be given morphine to be kept comfortable. She states "he's got a lot of life left in him" and still a strong man, helping the nurses turn, sitting up in bed, and eating. She does tell me that she and her daughter are not going to be able to care for him at home and he will need to go back to the facility. She also tells me that she will know when her husband is getting closer to the end-of-life.   The conversation was very scattered via telephone. Will meet with Mrs. Horseman in person tomorrow, 11/9,  to further discuss goals of care, code status, and disposition.    HCPOA-wife (Jan)    SUMMARY OF RECOMMENDATIONS    Discussed code status. Wife would like for him to remain a FULL code. Will further discuss this in detail tomorrow in person.   Detailed discussion regarding his current medical status and progressing cancer.   Wife unable to care for him at home. Wanting him to return to nursing facility.   PMT will further discuss [place] and code status in AM, 11/9 when she is able to meet in person.   Code Status/Advance Care Planning:  Full code   Symptom Management:   Per attending  Palliative Prophylaxis:   Aspiration, Delirium Protocol, Frequent Pain Assessment, Oral Care and Turn Reposition  Additional Recommendations (Limitations,  Scope, Preferences):  Full Scope Treatment  Psycho-social/Spiritual:   Desire for further Chaplaincy support:no  Additional Recommendations: Caregiving  Support/Resources and Education on Hospice  Prognosis:   Unable to determine  Discharge Planning: [place] for rehab with Palliative care service follow-up      Primary Diagnoses: Present on Admission: . Sepsis ([place])   I have reviewed the medical record, interviewed the patient and family, and examined the patient. The following aspects are pertinent.  Past Medical History:  Diagnosis Date  . Arthritis   . Blood transfusion without reported diagnosis    December 28, 2014 8 units   . Cancer ([place])    liver   . GERD (gastroesophageal reflux disease)   . Gout   . History of blood transfusion    Februrary 2017 4units   . Hyperlipidemia   . Hypertension   . Lung cancer ([place])   . 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 Prostate cancer ([place]) 03/2013   had seed implant and radiation for 5 weeks  . Type 2 diabetes mellitus ([place])   . Wears glasses    Social History   Social History  . Marital status: Married    Spouse name: N/A  . Number of children: N/A  . Years of education: N/A   Occupational History  . retired    Social History Main Topics  . Smoking status: Former Smoker    Packs/day: 1.00    Years: 30.00    Types: Cigarettes    Quit date: 02/01/2008  . Smokeless tobacco: Never Used  . Alcohol use No  . Drug use: No  . Sexual activity: Not Asked   Other Topics Concern  . None   Social History Narrative  . None   Family History  Problem Relation Age of Onset  . Cancer Mother     ovarian  . Heart disease Father   . Cancer Sister     breast  . Breast cancer Sister   . Cancer Brother     lung  . Cancer Brother     prostate  . Prostate cancer Brother   . Breast cancer Sister   . Lung cancer Brother   . Lung cancer Brother    Scheduled Meds: . sodium chloride    Intravenous Once  . aspirin EC  81 mg Oral Daily  . atorvastatin  40 mg Oral q1800  . Chlorhexidine Gluconate Cloth  6 each Topical Q0600  . dexamethasone  4 mg Oral TID  . enoxaparin (LOVENOX) injection  40 mg Subcutaneous Q24H  . feeding supplement (ENSURE ENLIVE)  237 mL Oral TID BM  . fentaNYL  25 mcg Transdermal Q72H  . ferrous sulfate  325 mg Oral BID WC  . fluticasone  1 spray Each Nare Daily  . guaiFENesin  1,200 mg Oral Daily  . insulin aspart  0-5 Units Subcutaneous QHS  . insulin aspart  0-9 Units Subcutaneous TID WC  . insulin glargine  20 Units Subcutaneous Daily  . loratadine  10 mg Oral Daily  . losartan  25 mg Oral Daily  . metoprolol succinate  25 mg Oral Daily  . montelukast  10 mg Oral QHS  . mupirocin ointment  1 application Nasal BID  . pantoprazole  40 mg Oral Daily  . piperacillin-tazobactam (ZOSYN)  IV  3.375 g Intravenous Q8H  . sodium chloride flush  3 mL Intravenous Q12H  . tamsulosin  0.8 mg Oral QPC supper  . vancomycin  1,250 mg Intravenous Q18H   Continuous Infusions: . sodium chloride 75 mL/hr at 12/09/15 0626   PRN Meds:.acetaminophen **OR** acetaminophen, ipratropium-albuterol, ondansetron **OR** ondansetron (ZOFRAN) IV, oxyCODONE Medications Prior to Admission:  Prior to Admission medications   Medication Sig Start Date End Date Taking? Authorizing Provider  albuterol (PROVENTIL HFA;VENTOLIN HFA) 108 (90 Base) MCG/ACT inhaler Inhale 1-2 puffs into the lungs every 6 (six) hours as needed.  09/11/15  Yes Historical Provider, MD  aspirin EC 81 MG tablet Take 81 mg by mouth.   Yes Historical Provider, MD  atorvastatin (LIPITOR) 40 MG tablet Take 40 mg by mouth daily at 6 PM.  11/27/15  Yes Historical Provider, MD  dexamethasone (DECADRON) 4 MG tablet Take 1 tablet (4 mg total) by mouth 3 (three) times daily. 11/23/15  Yes Vaughan Basta, MD  ferrous sulfate 325 (65 FE) MG tablet Take 1 tablet (325 mg total) by mouth 2 (two) times daily with a  meal. 03/20/15  Yes Lavina Hamman, MD  fexofenadine (ALLEGRA) 180 MG tablet Take 180 mg by mouth daily.   Yes Historical Provider, MD  Fluticasone Propionate (FLONASE NA) Place 1 spray into the nose daily.    Yes Historical Provider, MD  furosemide (LASIX) 20 MG tablet Take 20 mg by mouth daily.   Yes Historical Provider, MD  guaiFENesin (MUCINEX) 600 MG 12 hr tablet Take 1,200 mg by mouth daily.    Yes Historical Provider, MD  insulin glargine (LANTUS) 100 UNIT/ML injection Inject 0.2 mLs (20 Units total) into the skin daily. 11/24/15  Yes Vaughan Basta, MD  insulin NPH Human (HUMULIN N,NOVOLIN N) 100 UNIT/ML injection Inject into the skin. Sliding scale 11/13/15 12/13/15 Yes Historical Provider, MD  losartan (COZAAR) 25 MG tablet Take 25 mg by mouth daily.  03/17/15  Yes Historical Provider, MD  metFORMIN (GLUCOPHAGE) 1000 MG tablet Take 1,000 mg by mouth 2 (two) times daily with a meal.  02/28/13  Yes Historical Provider, MD  metoprolol succinate (TOPROL-XL) 25 MG 24 hr tablet Take 1 tablet (25 mg total) by mouth daily. 11/21/15  Yes Srikar Sudini, MD  montelukast (SINGULAIR) 10 MG tablet Take 1 tablet by mouth at bedtime.  10/19/15  Yes Historical Provider, MD  oxyCODONE (ROXICODONE) 5 MG immediate release tablet Take 1 tablet (5 mg total) by mouth every 6 (six) hours as needed for severe pain. 11/20/15  Yes Srikar Sudini, MD  pantoprazole (PROTONIX) 40 MG tablet Take 1 tablet (40 mg total) by mouth daily. 11/24/15  Yes Vaughan Basta, MD  tamsulosin (FLOMAX) 0.4 MG CAPS capsule Take 1 capsule (0.4 mg total) by mouth daily after supper. Patient taking differently: Take 0.8 mg by mouth daily after supper.  03/20/15  Yes  Lavina Hamman, MD  potassium chloride (K-DUR) 10 MEQ tablet Take 1 tablet by mouth 2 (two) times daily. 10/18/15   Historical Provider, MD   No Known Allergies Review of Systems  Constitutional: Positive for activity change, appetite change and fatigue.  Respiratory:  Positive for cough and shortness of breath.    Physical Exam  Constitutional: He is oriented to person, place, and time. He is cooperative.  Cardiovascular: Regular rhythm and normal heart sounds.   Pulmonary/Chest: Effort normal. He has decreased breath sounds.  Abdominal: Soft. Bowel sounds are normal.  Neurological: He is alert and oriented to person, place, and time.  Skin: Skin is warm and dry.  Psychiatric: His speech is delayed. He is withdrawn. Cognition and memory are normal.  Nursing note and vitals reviewed.   Vital Signs: BP (!) 99/52 (BP Location: Right Arm)   Pulse 78   Temp 98.1 F (36.7 C) (Oral)   Resp 18   Ht 6' (1.829 m)   Wt 79.7 kg (175 lb 12.8 oz)   SpO2 97%   BMI 23.84 kg/m  Pain Assessment: No/denies pain   Pain Score: 0-No pain  SpO2: SpO2: 97 % O2 Device:SpO2: 97 % O2 Flow Rate: .O2 Flow Rate (L/min): 2 L/min  IO: Intake/output summary:   Intake/Output Summary (Last 24 hours) at 12/09/15 1318 Last data filed at 12/09/15 0429  Gross per 24 hour  Intake             2077 ml  Output                0 ml  Net             2077 ml    LBM: Last BM Date: 12/05/15 Baseline Weight: Weight: 101.2 kg (223 lb) Most recent weight: Weight: 79.7 kg (175 lb 12.8 oz)     Palliative Assessment/Data: PPS 40%   Flowsheet Rows   Flowsheet Row Most Recent Value  Intake Tab  Referral Department  Hospitalist  Unit at Time of Referral  Oncology Unit  Palliative Care Primary Diagnosis  Cancer  Date Notified  12/09/15  Palliative Care Type  Return patient Palliative Care  Reason for referral  Clarify Goals of Care  Date of Admission  12/08/15  Date first seen by Palliative Care  12/09/15  # of days Palliative referral response time  0 Day(s)  # of days IP prior to Palliative referral  1  Clinical Assessment  Palliative Performance Scale Score  40%  Psychosocial & Spiritual Assessment  Palliative Care Outcomes  Patient/Family meeting held?  Yes  Who was at  the meeting?  wife via telephone  Palliative Care Outcomes  Clarified goals of care, Counseled regarding hospice, Provided advance care planning, Provided psychosocial or spiritual support      Time In: 1000 Time Out: 1120 Time Total: 93mn Greater than 50%  of this time was spent counseling and coordinating care related to the above assessment and plan.  Signed by:  MIhor Dow FNP-C Palliative Medicine Team  Phone: [phone]: [phone]  Please contact Palliative Medicine Team phone at [phone] questions and concerns.  For individual provider: See AShea Evans

## 2015-12-09 NOTE — Progress Notes (Signed)
Pharmacy Antibiotic Note  [name] is a 75 y.o. male admitted on 12/08/2015 with pneumonia/HCAP.  Pharmacy has been consulted for Vancomycin and Zosyn dosing. Patient from [facility], Hx of Lung Ca w/ mets.  Plan: Patient received Zosyn x 1 and Vancomycin 1 gram x 1 in ER. Will continue with Stacked dosing and  Vancomycin 1000 IV every 12 hours.  Goal trough 15-20 mcg/mL. Zosyn 3.375g IV q8h (4 hour infusion).  Ke 0.056  T1/2 12.38  Vd 60.90    (Wt on admission was 101.2 kg; Adj Bw= 87 kg)  Vancomycin trough ordered prior to 5th dose on 11/9 at 0330.  11/8- Weight changed to 78 kg from 101.2 kg. RN re-weighed pt to confirm with new wt of 79.7 kg.   Will adjust Vancomycin to '1250mg'$  IV q18h for increase in Scr and decrease in weight. Will reschedule trough for 11/10 at 1030.     Height: 6' (182.9 cm) Weight: 175 lb 12.8 oz (79.7 kg) IBW/kg (Calculated) : 77.6  Temp (24hrs), Avg:98.3 F (36.8 C), Min:98 F (36.7 C), Max:98.7 F (37.1 C)   Recent Labs Lab 12/03/15 1012 12/08/15 0639 12/08/15 0715 12/08/15 1120 12/09/15 0454 12/09/15 0746  WBC 23.2* 31.8*  --   --  20.3*  --   CREATININE 1.00  --  1.12  --  1.27*  --   LATICACIDVEN  --   --  2.5* 2.5* 2.3* 2.2*    Estimated Creatinine Clearance: 54.3 mL/min (by C-G formula based on SCr of 1.27 mg/dL (H)).    No Known Allergies  Antimicrobials this admission: Levaquin 750 x 1 in ER Vanc  11/7>>   Zosyn  11/7 >>    Dose adjustments this admission:     Microbiology results:  11/7 BCx: P   UCx:      Sputum:    11/7 MRSA PCR: POSITIVE   Thank you for allowing pharmacy to be a part of this patient's care.  Rehan Holness A 12/09/2015 11:35 AM

## 2015-12-09 NOTE — Progress Notes (Signed)
[place] [facility]                                                                                                                                                                                            Patient Demographics   [name], is a 75 y.o. male, DOB - 1941-01-15, KDX:[id number]  Admit date - 12/08/2015   Admitting Physician Lytle Butte, MD  Outpatient Primary MD for the patient is SPARKS,JEFFREY D, MD   LOS - 1  Subjective: Patient admitted with shortness of breath and pneumonia. His currently sleepy. He was started on Duragesic patch yesterday, when stimulated he is arousable but unable to provide any history    Review of Systems:   CONSTITUTIONAL:Unable to provide due to his mental status  Vitals:   Vitals:   12/09/15 0443 12/09/15 0914 12/09/15 0932 12/09/15 1441  BP: (!) 91/55  (!) 99/52 111/62  Pulse: 78  78 88  Resp: '18  18 20  '$ Temp: 98.6 F (37 C)  98.1 F (36.7 C)   TempSrc: Oral  Oral   SpO2: 99%  97% 96%  Weight:  175 lb 12.8 oz (79.7 kg)    Height:        Wt Readings from Last 3 Encounters:  12/09/15 175 lb 12.8 oz (79.7 kg)  12/03/15 223 lb (101.2 kg)  11/17/15 223 lb (101.2 kg)     Intake/Output Summary (Last 24 hours) at 12/09/15 1442 Last data filed at 12/09/15 0429  Gross per 24 hour  Intake             2077 ml  Output                0 ml  Net             2077 ml    Physical Exam:   GENERAL: Chronically ill-appearing HEAD, EYES, EARS, NOSE AND THROAT: Atraumatic, normocephalic. Pupils equal and reactive to light. Sclerae anicteric. No conjunctival injection. No oro-pharyngeal erythema.  NECK: Supple. There is no jugular venous distention. No bruits, no lymphadenopathy, no thyromegaly.  HEART: Regular rate and rhythm,. No murmurs, no rubs, no clicks.  LUNGS: Rhonchus breath sounds bilaterally No rales' \\no'$  crackles. No wheezes.  ABDOMEN: Soft, flat, nontender, nondistended. Has good  bowel sounds. No hepatosplenomegaly appreciated.  EXTREMITIES: No evidence of any cyanosis, clubbing, or peripheral edema.  +2 pedal and radial pulses bilaterally.  NEUROLOGIC: The patient is drowsy  SKIN: Moist and warm with no rashes appreciated.  Psych: Not anxious, depressed LN: No inguinal LN enlargement    Antibiotics  Anti-infectives    Start     Dose/Rate Route Frequency Ordered Stop   12/09/15 2300  vancomycin (VANCOCIN) 1,250 mg in sodium chloride 0.9 % 250 mL IVPB     1,250 mg 166.7 mL/hr over 90 Minutes Intravenous Every 18 hours 12/09/15 1134     12/08/15 1600  vancomycin (VANCOCIN) IVPB 1000 mg/200 mL premix  Status:  Discontinued     1,000 mg 200 mL/hr over 60 Minutes Intravenous Every 12 hours 12/08/15 1347 12/09/15 1134   12/08/15 1500  piperacillin-tazobactam (ZOSYN) IVPB 3.375 g     3.375 g 12.5 mL/hr over 240 Minutes Intravenous Every 8 hours 12/08/15 1347     12/08/15 0715  vancomycin (VANCOCIN) IVPB 1000 mg/200 mL premix     1,000 mg 200 mL/hr over 60 Minutes Intravenous  Once 12/08/15 0708 12/08/15 0834   12/08/15 0715  piperacillin-tazobactam (ZOSYN) IVPB 3.375 g     3.375 g 12.5 mL/hr over 240 Minutes Intravenous  Once 12/08/15 0708 12/08/15 0749   12/08/15 0715  levofloxacin (LEVAQUIN) IVPB 750 mg     750 mg 100 mL/hr over 90 Minutes Intravenous  Once 12/08/15 0708 12/08/15 1553      Medications   Scheduled Meds: . sodium chloride   Intravenous Once  . aspirin EC  81 mg Oral Daily  . atorvastatin  40 mg Oral q1800  . Chlorhexidine Gluconate Cloth  6 each Topical Q0600  . dexamethasone  4 mg Intravenous Q6H  . enoxaparin (LOVENOX) injection  40 mg Subcutaneous Q24H  . feeding supplement (ENSURE ENLIVE)  237 mL Oral TID BM  . ferrous sulfate  325 mg Oral BID WC  . fluticasone  1 spray Each Nare Daily  . guaiFENesin  1,200 mg Oral Daily  . insulin aspart  0-5 Units Subcutaneous QHS  . insulin aspart  0-9 Units Subcutaneous TID WC  . insulin  glargine  20 Units Subcutaneous Daily  . loratadine  10 mg Oral Daily  . losartan  25 mg Oral Daily  . metoprolol succinate  25 mg Oral Daily  . montelukast  10 mg Oral QHS  . mupirocin ointment  1 application Nasal BID  . pantoprazole  40 mg Oral Daily  . piperacillin-tazobactam (ZOSYN)  IV  3.375 g Intravenous Q8H  . sodium chloride flush  3 mL Intravenous Q12H  . tamsulosin  0.8 mg Oral QPC supper  . vancomycin  1,250 mg Intravenous Q18H   Continuous Infusions: PRN Meds:.acetaminophen **OR** acetaminophen, ipratropium-albuterol, ondansetron **OR** ondansetron (ZOFRAN) IV, oxyCODONE   Data Review:   Micro Results Recent Results (from the past 240 hour(s))  Blood culture (routine x 2)     Status: None (Preliminary result)   Collection Time: 12/08/15  7:15 AM  Result Value Ref Range Status   Specimen Description BLOOD L AC  Final   Special Requests   Final    BOTTLES DRAWN AEROBIC AND ANAEROBIC AER 10ML ANA 11ML   Culture NO GROWTH 1 DAY  Final   Report Status PENDING  Incomplete  Blood culture (routine x 2)     Status: None (Preliminary result)   Collection Time: 12/08/15  7:15 AM  Result Value Ref Range Status   Specimen Description BLOOD R WRIST  Final   Special Requests   Final    BOTTLES DRAWN AEROBIC AND ANAEROBIC AER 10ML ANA 9ML   Culture NO GROWTH 1 DAY  Final   Report Status PENDING  Incomplete  MRSA PCR Screening  Status: Abnormal   Collection Time: 12/08/15  2:14 PM  Result Value Ref Range Status   MRSA by PCR POSITIVE (A) NEGATIVE Final    Comment:        The GeneXpert MRSA Assay (FDA approved for NASAL specimens only), is one component of a comprehensive MRSA colonization surveillance program. It is not intended to diagnose MRSA infection nor to guide or monitor treatment for MRSA infections. RESULT CALLED TO, READ BACK BY AND VERIFIED WITH: Earlie Server @ 5400 12/08/15 by [facility]     Radiology Reports Dg Chest 2 View  Result Date:  12/08/2015 CLINICAL DATA:  Shortness of breath and cough.  Known lung carcinoma EXAM: CHEST  2 VIEW COMPARISON:  November 09, 2015 chest radiograph; chest CT October 12, 2015 FINDINGS: The known mass in the right base is not well distinguished on this study. There is consolidation throughout the right mid and lower lung zones with moderate effusion on the right. There is also elevation of the right hemidiaphragm. There is atelectatic change in the left base. The nodular opacities noted throughout the lungs on CT are not appreciable by radiography. Heart is upper normal in size with pulmonary vascularity within normal limits. Increased opacity in the sub- carinal region is most likely due to adenopathy. Adenopathy is much better seen on recent CT examination of by radiography. There is atherosclerotic calcification aorta. No blastic or lytic bone lesions are evident. There is degenerative change in both shoulders. IMPRESSION: Increased consolidation in the right mid and lower lung zones extending into the posterior segment of the right upper lobe. There is moderate pleural effusion on the right as well as elevation the right hemidiaphragm. The known mass in the right base is obscured by surrounding consolidation. On the left, there is atelectasis in the left base. The nodular opacity seen on radiography throughout the lungs are not appreciable by radiography. There is aortic atherosclerosis. Subcarinal adenopathy is noted. Adenopathy in other areas is much better seen on recent CT than on this current radiographic examination. Electronically Signed   By: Lowella Grip III M.D.   On: 12/08/2015 07:18   Mr Jeri Cos QQ Contrast  Result Date: 11/19/2015 CLINICAL DATA:  Encephalopathy. History of prostate cancer and metastatic lung cancer on chemotherapy. Rectal bleeding. EXAM: MRI HEAD WITHOUT AND WITH CONTRAST TECHNIQUE: Multiplanar, multiecho pulse sequences of the brain and surrounding structures were obtained  without and with intravenous contrast. CONTRAST:  6m MULTIHANCE GADOBENATE DIMEGLUMINE 529 MG/ML IV SOLN COMPARISON:  None. FINDINGS: Brain: There is no evidence of acute infarct, midline shift, or extra-axial fluid collection. Moderate cerebral atrophy is noted. Periventricular white matter T2 hyperintensities are nonspecific but may reflect mild chronic small vessel ischemic disease or post treatment changes. Small chronic infarcts are noted in the right paramedian pons and posterior right corona radiata. There are numerous predominantly ring-enhancing brain lesions. A single lesion in the right cerebellum measures 9 mm. There are at least 13 supratentorial lesions. The largest lesion in the right cerebral hemisphere measures 2.2 x 1.8 cm in the frontal lobe (series 14, image 35), with the largest left hemispheric lesion located in the posterior frontal lobe (series 14, image 46). Multiple lesions demonstrate chronic blood products, with a 1.8 x 1.3 cm left frontal lobe lesion potentially with some subacute blood products (series 14, image 42). The right cerebellar lesion is also hemorrhagic, potentially with subacute blood products. There is mild edema associated with some of the larger supratentorial lesions without significant mass effect.  Vascular: Major intracranial vascular flow voids are preserved. Skull and upper cervical spine: No suspicious osseous lesion identified. Sinuses/Orbits: Unremarkable orbits. Clear paranasal sinuses. Small left mastoid effusion. Other: None. IMPRESSION: 1. 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 Mild associated edema without significant mass effect. 2. 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 Electronically Signed   By: Logan Bores M.D.   On: 11/19/2015 12:18   Dg Chest Port 1 View  Result Date: 11/10/2015 CLINICAL DATA:  Acute onset of rectal bleeding.  Initial encounter. EXAM: PORTABLE CHEST 1 VIEW COMPARISON:  Chest  radiograph performed 12/31/2014, and CT of the chest performed 10/12/2015 FINDINGS: The patient's known bilateral lung cancer is better characterized on recent CT. Previously noted pulmonary nodules are less apparent on the current study. The large right basilar mass is partially characterized. There is elevation of the right hemidiaphragm. No definite pleural effusion or pneumothorax is seen. The cardiomediastinal silhouette is borderline normal in size. No acute osseous abnormalities are seen. IMPRESSION: Known bilateral lung cancer is better characterized on recent CT. Previously noted pulmonary nodules are less apparent on the current study, likely reflecting mild interval improvement. Large right basilar lung mass again noted. Elevation of the right hemidiaphragm Electronically Signed   By: Garald Balding M.D.   On: 11/10/2015 00:40     CBC  Recent Labs Lab 12/03/15 1012 12/08/15 0639 12/09/15 0454  WBC 23.2* 31.8* 20.3*  HGB 12.4* 12.8* 11.1*  HCT 38.0* 39.6* 34.7*  PLT 268 230 172  MCV 75.6* 75.5* 76.8*  MCH 24.7* 24.4* 24.5*  MCHC 32.7 32.3 31.9*  RDW 17.1* 17.0* 18.2*  LYMPHSABS 1.4 1.0  --   MONOABS 1.4* 1.6*  --   EOSABS 0.0 0.0  --   BASOSABS 0.0 0.0  --     Chemistries   Recent Labs Lab 12/03/15 1012 12/08/15 0715 12/09/15 0454  NA 131* 140 141  K 5.8* 5.4* 4.8  CL 99* 106 107  CO2 20* 24 24  GLUCOSE 333* 299* 369*  BUN 36* 58* 50*  CREATININE 1.00 1.12 1.27*  CALCIUM 9.0 9.1 8.2*  AST 57* 43*  --   ALT 63 51  --   ALKPHOS 213* 203*  --   BILITOT 0.8 0.9  --    ------------------------------------------------------------------------------------------------------------------ estimated creatinine clearance is 54.3 mL/min (by C-G formula based on SCr of 1.27 mg/dL (H)). ------------------------------------------------------------------------------------------------------------------  Recent Labs  12/08/15 0639  HGBA1C 8.8*    ------------------------------------------------------------------------------------------------------------------ No results for input(s): CHOL, HDL, LDLCALC, TRIG, CHOLHDL, LDLDIRECT in the last 72 hours. ------------------------------------------------------------------------------------------------------------------ No results for input(s): TSH, T4TOTAL, T3FREE, THYROIDAB in the last 72 hours.  Invalid input(s): FREET3 ------------------------------------------------------------------------------------------------------------------ No results for input(s): VITAMINB12, FOLATE, FERRITIN, TIBC, IRON, RETICCTPCT in the last 72 hours.  Coagulation profile No results for input(s): INR, PROTIME in the last 168 hours.  No results for input(s): DDIMER in the last 72 hours.  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 Type 2 diabetes insulin requiring low sugars in the 300s I will increase his Lantus to 28 units since his not eating I will not add pre-meal insulin 4. Essential hypertension continue home  medications    Code status: Patient was previously full code family has decided to reverse his CODE STATUS, prognosis very poor in this patient I have asked palliative care team to see      Code Status Orders        Start     Ordered   12/08/15 0941  Full code  Continuous     12/08/15 0940    Code Status History    Date Active Date Inactive Code Status Order ID Comments User Context   12/08/2015  9:14 AM 12/08/2015  9:40 AM DNR [id number]  Lytle Butte, MD ED   11/18/2015  1:21 AM 11/23/2015  7:13 PM DNR [id number]  Saundra Shelling, MD ED   03/17/2015  9:34 PM 03/20/2015  9:43 PM DNR [id number]  Vianne Bulls, MD ED   12/28/2014  8:58 PM 01/04/2015  8:21 PM DNR [id number]  Gennaro Africa, MD Inpatient   12/28/2014  7:59 PM 12/28/2014  8:58 PM Full Code [id number]  Gennaro Africa, MD Inpatient    Advance Directive Documentation   Flowsheet Row Most Recent Value  Type of Advance Directive  Healthcare Power of Attorney, Living will  Pre-existing out of facility DNR order (yellow form or pink MOST form)  No data  "MOST" Form in Place?  No data           Consults  Oncology   DVT Prophylaxis  Lovenox   Lab Results  Component Value Date   PLT 172 12/09/2015     Time Spent in minutes   37mn  Greater than 50% of time spent in care coordination and counseling patient regarding the condition and plan of care.   PDustin FlockM.D on 12/09/2015 at 2:42 PM  Between 7am to 6pm - Pager - [phone]  After 6pm go to [email or web address] - password [email or web address]   Office  [phone]

## 2015-12-09 NOTE — Progress Notes (Signed)
Pharmacy Antibiotic Note  [name] is a 75 y.o. male admitted on 12/08/2015 with pneumonia/HCAP.  Pharmacy has been consulted for Vancomycin and Zosyn dosing. Patient from [facility], Hx of Lung Ca w/ mets.  Plan: Patient received Zosyn x 1 and Vancomycin 1 gram x 1 in ER. Will continue with Stacked dosing and  Vancomycin 1000 IV every 12 hours.  Goal trough 15-20 mcg/mL. Zosyn 3.375g IV q8h (4 hour infusion).  Ke 0.056  T1/2 12.38  Vd 60.90    (Wt on admission was 101.2 kg; Adj Bw= 87 kg)  Vancomycin trough ordered prior to 5th dose on 11/9 at 0330.     Height: 6' (182.9 cm) Weight: 172 lb (78 kg) IBW/kg (Calculated) : 77.6  Temp (24hrs), Avg:98.2 F (36.8 C), Min:97.7 F (36.5 C), Max:98.7 F (37.1 C)   Recent Labs Lab 12/03/15 1012 12/08/15 0639 12/08/15 0715 12/08/15 1120 12/09/15 0454  WBC 23.2* 31.8*  --   --  20.3*  CREATININE 1.00  --  1.12  --  1.27*  LATICACIDVEN  --   --  2.5* 2.5* 2.3*    Estimated Creatinine Clearance: 54.3 mL/min (by C-G formula based on SCr of 1.27 mg/dL (H)).    No Known Allergies  Antimicrobials this admission: Levaquin 750 x 1 in ER Vanc  11/7>>   Zosyn  11/7 >>    Dose adjustments this admission:     Microbiology results:  11/7 BCx: P   UCx:      Sputum:    11/7 MRSA PCR: POSITIVE   Thank you for allowing pharmacy to be a part of this patient's care.  Alacia Rehmann A 12/09/2015 8:50 AM

## 2015-12-09 NOTE — Evaluation (Signed)
Clinical/Bedside Swallow Evaluation Patient Details  Name: [name] MRN: [id number] Date of Birth: [birth year]  Today's Date: 12/09/2015 Time: SLP Start Time (ACUTE ONLY): 0930 SLP Stop Time (ACUTE ONLY): 1030 SLP Time Calculation (min) (ACUTE ONLY): 60 min  Past Medical History:  Past Medical History:  Diagnosis Date  . Arthritis   . Blood transfusion without reported diagnosis    December 28, 2014 8 units   . Cancer ([place])    liver   . GERD (gastroesophageal reflux disease)   . Gout   . History of blood transfusion    Februrary 2017 4units   . Hyperlipidemia   . Hypertension   . Lung cancer ([place])   . 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 Prostate cancer ([place]) 03/2013   had seed implant and radiation for 5 weeks  . Type 2 diabetes mellitus ([place])   . Wears glasses    Past Surgical History:  Past Surgical History:  Procedure Laterality Date  . COLONOSCOPY  2010  . CYSTOSCOPY N/A 07/04/2013   Procedure: CYSTOSCOPY FLEXIBLE;  Surgeon: Claybon Jabs, MD;  Location: [facility];  Service: Urology;  Laterality: N/A;  . INGUINAL HERNIA REPAIR Left 2005  . LIVER BIOPSY Right 11/02/2015  . PROSTATE BIOPSY    . RADIOACTIVE SEED IMPLANT N/A 07/04/2013   Procedure: RADIOACTIVE SEED IMPLANT;  Surgeon: Claybon Jabs, MD;  Location: [facility];  Service: Urology;  Laterality: N/A;  . REPAIR CHRONIC INCARCERATED RECURRENT LEFT INGUINAL HERNIA  10-23-2008  . TRANSURETHRAL RESECTION OF BLADDER TUMOR N/A 01/02/2015   Procedure: Cystoscopy clot evalucation and fulgration;  Surgeon: Kathie Rhodes, MD;  Location: [facility];  Service: Urology;  Laterality: N/A;   HPI:  Pt is a 74 y.o. male with a known history of Stage IV lung cancer followed with Dr.B, and planning whole brain radiation who is presenting from [facility] with shortness of breath. Patient's wife is at bedside and provides most of the history. Recently discharged from [facility] to [facility] for rehabilitation, he suffered a mechanical fall about a week ago with no obvious trauma but has been complaining of intermittent pain in the right chest since the incident. Now for the last 2 days duration has been experiencing cough and shortness of breath. No fevers chills, positive generalized weakness. 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 He had been under Dr. Aletha Halim care, and was slated to begin WBRT this week, but could not make it due to a fall and subsequent admission to rehab. He is now back with worsening SOB. He had done poorly with chemo and his ECOG PS currently would deter any treatment options includin Radiation to the brain metastasis.    Assessment / Plan / Recommendation Clinical Impression  Pt appeared to adequately tolerate po tirals of thin liquids, given via cup and straw, puree and soft solids with no immediate overt s/s of aspiration following aspiration precautions. Pt appears at minimal risk for aspiration following strict aspiration precautions due to need of full supervision and current cogntive status. Pt exhibited increased time for mastication with solids, unsure if due to Pt's claim he "lost his top plate" of dentures and missing bottom dentition. Pt also exhibited prolonged oral transit and minimal oral residue with solids. Pt was educated on aspiration precautions and the importance on following aspiration precautions. Unsure of Pt's baseline comprehension - may need to  follow up with wife/caregiver. Pt required minimal verbal cues to follow aspiration precautions, specific to making sure his mouth was clear before taking another bite, due to current cogntive status and loss of interest in the task of eating. Unsure of Pt's baseline swallowing and congtive status. Per Md note,  he has a rapidly progressive large cell neuroendicrine cancer with  multiple live metastases and lung metastasis and brain metastasis. Recommend a Dysphagia 3 diet with thin liquids following strict aspiration precautions for easier mastication. Recommend any further modification of diet consistency as Pt needs if medical status declines. Recommend meds be given 1-2 pills at a time with thin liquids, as was observered with Nursing this morning and Pt tolerated well. Nursing consulted and agreed. ST services will follow up to monitor diet toleration during admission.     Aspiration Risk  Mild aspiration risk    Diet Recommendation Dysphagia 3 (Mech soft);Thin liquid   Liquid Administration via: Cup;Straw Medication Administration: Whole meds with liquid (Give 1-2 meds at a time) Supervision: Patient able to self feed;Staff to assist with self feeding;Full supervision/cueing for compensatory strategies Compensations: Minimize environmental distractions;Slow rate;Small sips/bites;Follow solids with liquid Postural Changes: Seated upright at 90 degrees;Remain upright for at least 30 minutes after po intake    Other  Recommendations Recommended Consults:  (Dietician Consult) Oral Care Recommendations: Oral care BID;Staff/trained caregiver to provide oral care   Follow up Recommendations  (TBD)      Frequency and Duration min 3x week  2 weeks       Prognosis Prognosis for Safe Diet Advancement: Good      Swallow Study   General Date of Onset: 12/08/15 HPI: Pt is a 75 y.o. male with a known history of Stage IV lung cancer followed with Dr.B, and planning whole brain radiation who is presenting from [facility] with shortness of breath. Patient's wife is at bedside and provides most of the history. Recently discharged from [facility] to [facility] for rehabilitation, he suffered a mechanical fall about a week ago with no obvious trauma but has been complaining of intermittent pain in the right chest since the incident. Now for the last 2 days  duration has been experiencing cough and shortness of breath. No fevers chills, positive generalized weakness. 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 He had been under Dr. Aletha Halim care, and was slated to begin WBRT this week, but could not make it due to a fall and subsequent admission to rehab. He is now back with worsening SOB. He had done poorly with chemo and his ECOG PS currently would deter any treatment options includin Radiation to the brain metastasis.  Type of Study: Bedside Swallow Evaluation Previous Swallow Assessment: None indicated Diet Prior to this Study: Regular;Thin liquids Temperature Spikes Noted: No (WBC 20.3) Respiratory Status:  (Not noted) History of Recent Intubation: No Behavior/Cognition: Alert;Cooperative;Pleasant mood;Requires cueing;Distractible Oral Cavity Assessment: Within Functional Limits Oral Care Completed by SLP: Recent completion by staff Oral Cavity - Dentition: Dentures, not available;Missing dentition Vision: Functional for self-feeding Self-Feeding Abilities: Able to feed self;Needs set up;Needs assist;Total assist Patient Positioning: Upright in bed Baseline Vocal Quality: Normal Volitional Cough: Strong Volitional Swallow: Able to elicit    Oral/Motor/Sensory Function Overall Oral Motor/Sensory Function: Within functional limits   Ice Chips Ice chips: Not tested   Thin Liquid Thin Liquid: Within functional limits Presentation: Cup;Self Fed;Straw (10+ trials)    Nectar Thick Nectar  Thick Liquid: Not tested   Honey Thick Honey Thick Liquid: Not tested   Puree Puree: Within functional limits Presentation: Self Fed;Spoon (6 trials)   Solid   GO   Solid: Impaired Presentation: Self Fed;Spoon (3 trials) Oral Phase Impairments: Impaired mastication Oral Phase Functional Implications: Prolonged oral transit;Oral residue;Impaired  mastication Pharyngeal Phase Impairments:  (None)        Rivka Safer, B.A. Clinical Graduate Student 12/09/2015,2:09 PM  This information has been reviewed and agreed upon by this supervising clinician.  Orinda Kenner, [place], CCC-SLP

## 2015-12-09 NOTE — Care Management Important Message (Signed)
Important Message  Patient Details  Name: [name] MRN: [id number] Date of Birth: August 26, 1940   Medicare Important Message Given:  Yes    [name], RN 12/09/2015, 8:30 AM

## 2015-12-09 NOTE — Progress Notes (Signed)
Inpatient Diabetes Program Recommendations  AACE/ADA: New Consensus Statement on Inpatient Glycemic Control (2015)  Target Ranges:  Prepandial:   less than 140 mg/dL      Peak postprandial:   less than 180 mg/dL (1-2 hours)      Critically ill patients:  140 - 180 mg/dL   Results for [name], [name] (MRN [id number]) as of 12/09/2015 09:36  Ref. Range 12/08/2015 11:53 12/08/2015 17:06 12/08/2015 21:44 12/09/2015 07:31  Glucose-Capillary Latest Ref Range: 65 - 99 mg/dL 275 (H) 370 (H) 389 (H) 372 (H)   Review of Glycemic Control  Current orders for Inpatient glycemic control: Lantus 20 units daily, Novolog 0-9 units TID with meals, Novolog 0-5 units QHS  Inpatient Diabetes Program Recommendations: Insulin - Basal: Please consider increasing Lantus to 28 units daily. If Lantus increased as requested please consider ordering a one time order for Lantus 8 units x 1 now since patient has already received Lantus this morning. Correction (SSI): Please consider increasing Novolog correction to moderate scale. Insulin - Meal Coverage: If steroids are continued and patient is eating at least 50% of meals, please consider ordering Novolog 4 units TID with meals for meal coverage. Diet: If appropriate, please consider changing supplement from Ensure to Glucerna.  Thanks, Barnie Alderman, RN, MSN, CDE Diabetes Coordinator Inpatient Diabetes Program [phone] (Team Pager from 8am to 5pm)

## 2015-12-09 NOTE — Plan of Care (Signed)
Problem: Safety: Goal: Ability to remain free from injury will improve Outcome: Progressing Sleeping most of the day, but can easily arousable.    Problem: Pain Managment: Goal: General experience of comfort will improve Outcome: Progressing No c/o pain. Fentanyl patch removed per order due to sleepiness.  Problem: Nutrition: Goal: Adequate nutrition will be maintained Outcome: Progressing Encouraging Meals. Glucerna 3 times daily.

## 2015-12-09 NOTE — Progress Notes (Signed)
LCSW following and received call from wife. Wife reports she has not confirmed her plans regarding returning to SNF as stated on admission per family. Wife is working with daughter and MD along with palliative care team to define goals of care.  Disposition continues to be determined. Will continue to follow and assist with planning once patient is medically. Unclear if she wants him to return at this time.  Reports she is working with medical staff to understand level of care needed at home and if she can manage.   Lane Hacker, MSW Clinical Social Work: [employer] Coverage for :  [phone]

## 2015-12-10 ENCOUNTER — Inpatient Hospital Stay: Payer: Medicare Other

## 2015-12-10 DIAGNOSIS — Z7189 Other specified counseling: Secondary | ICD-10-CM

## 2015-12-10 LAB — CBC WITH DIFFERENTIAL/PLATELET
BASOS ABS: 0 10*3/uL (ref 0–0.1)
BASOS PCT: 0 %
Eosinophils Absolute: 0 10*3/uL (ref 0–0.7)
Eosinophils Relative: 0 %
HEMATOCRIT: 32.4 % — AB (ref 40.0–52.0)
HEMOGLOBIN: 10.7 g/dL — AB (ref 13.0–18.0)
LYMPHS PCT: 1 %
Lymphs Abs: 0.3 10*3/uL — ABNORMAL LOW (ref 1.0–3.6)
MCH: 24.8 pg — ABNORMAL LOW (ref 26.0–34.0)
MCHC: 32.9 g/dL (ref 32.0–36.0)
MCV: 75.5 fL — AB (ref 80.0–100.0)
Monocytes Absolute: 0.8 10*3/uL (ref 0.2–1.0)
Monocytes Relative: 4 %
NEUTROS ABS: 21.1 10*3/uL — AB (ref 1.4–6.5)
NEUTROS PCT: 95 %
Platelets: 167 10*3/uL (ref 150–440)
RBC: 4.29 MIL/uL — AB (ref 4.40–5.90)
RDW: 17.3 % — AB (ref 11.5–14.5)
WBC: 22.3 10*3/uL — AB (ref 3.8–10.6)

## 2015-12-10 LAB — BASIC METABOLIC PANEL
ANION GAP: 6 (ref 5–15)
ANION GAP: 8 (ref 5–15)
BUN: 58 mg/dL — AB (ref 6–20)
BUN: 58 mg/dL — ABNORMAL HIGH (ref 6–20)
CALCIUM: 8.5 mg/dL — AB (ref 8.9–10.3)
CHLORIDE: 110 mmol/L (ref 101–111)
CHLORIDE: 109 mmol/L (ref 101–111)
CO2: 25 mmol/L (ref 22–32)
CO2: 23 mmol/L (ref 22–32)
Calcium: 8.2 mg/dL — ABNORMAL LOW (ref 8.9–10.3)
Creatinine, Ser: 1.22 mg/dL (ref 0.61–1.24)
Creatinine, Ser: 1.18 mg/dL (ref 0.61–1.24)
GFR calc Af Amer: >60 mL/min (ref >60)
GFR calc non Af Amer: 58 mL/min — ABNORMAL LOW (ref >60)
GFR, EST NON AFRICAN AMERICAN: 56 mL/min — AB (ref >60)
GLUCOSE: 294 mg/dL — AB (ref 65–99)
Glucose, Bld: 261 mg/dL — ABNORMAL HIGH (ref 65–99)
POTASSIUM: 4.7 mmol/L (ref 3.5–5.1)
POTASSIUM: 4.5 mmol/L (ref 3.5–5.1)
SODIUM: 141 mmol/L (ref 135–145)
Sodium: 140 mmol/L (ref 135–145)

## 2015-12-10 LAB — GLUCOSE, CAPILLARY
GLUCOSE-CAPILLARY: 270 mg/dL — AB (ref 65–99)
GLUCOSE-CAPILLARY: 183 mg/dL — AB (ref 65–99)
GLUCOSE-CAPILLARY: 228 mg/dL — AB (ref 65–99)
Glucose-Capillary: 224 mg/dL — ABNORMAL HIGH (ref 65–99)
Glucose-Capillary: 266 mg/dL — ABNORMAL HIGH (ref 65–99)

## 2015-12-10 MED ORDER — SENNA 8.6 MG PO TABS
1.0000 | ORAL_TABLET | Freq: Two times a day (BID) | ORAL | Status: DC
  Administered 2015-12-10 – 2015-12-16 (×13): 8.6 mg via ORAL
  Filled 2015-12-10 (×14): qty 1

## 2015-12-10 MED ORDER — ENOXAPARIN SODIUM 40 MG/0.4ML ~~LOC~~ SOLN
40.0000 mg | SUBCUTANEOUS | Status: DC
  Administered 2015-12-11 – 2015-12-14 (×4): 40 mg via SUBCUTANEOUS
  Filled 2015-12-10 (×4): qty 0.4

## 2015-12-10 MED ORDER — INSULIN ASPART 100 UNIT/ML ~~LOC~~ SOLN
5.0000 [IU] | Freq: Three times a day (TID) | SUBCUTANEOUS | Status: DC
  Administered 2015-12-10 – 2015-12-12 (×4): 5 [IU] via SUBCUTANEOUS
  Filled 2015-12-10 (×4): qty 5

## 2015-12-10 NOTE — NC FL2 (Addendum)
[place] LEVEL OF CARE SCREENING TOOL     IDENTIFICATION  Patient Name: [name] Birthdate: [birth year] Sex: male Admission Date (Current Location): 12/08/2015  [place] and [place] Number:  Engineering geologist and Address:  [facility], [address] [place], [place] [address]      Provider Number: [id number]  Attending Physician Name and Address:  Dustin Flock, MD  Relative Name and Phone Number:       Current Level of Care: Hospital Recommended Level of Care: [place] Prior Approval Number:    Date Approved/Denied:   PASRR Number: [id number]  Discharge Plan: SNF    Current Diagnoses: Patient Active Problem List   Diagnosis Date Noted  . Goals of care, counseling/discussion   . Palliative care encounter   . DNR (do not resuscitate) discussion   . Aspiration pneumonia of right lower lobe ([place])   . Sepsis ([place]) 12/08/2015  . DNR (do not resuscitate) 11/19/2015  . Palliative care by specialist 11/19/2015  . Brain metastases ([place]) 11/19/2015  . Primary cancer of right lower lobe of lung ([place]) 10/30/2015  . Generalized weakness 10/30/2015  . Metastases to the liver ([place]) 10/20/2015  . Multiple lung nodules 10/20/2015  . Iron deficiency anemia due to chronic blood loss 10/20/2015  . Prostate cancer ([place]) 10/20/2015  . Type 2 diabetes mellitus ([place]) 03/10/2013  . hypercholesterolemia 03/10/2013  . hypertension 03/10/2013  . 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 INGUINAL HERNIA 08/15/2008    Orientation RESPIRATION BLADDER Height & Weight     Time, Situation, Place, Self  Normal Incontinent Weight: 175 lb 12.8 oz (79.7 kg) Height:  6' (182.9 cm)  BEHAVIORAL SYMPTOMS/MOOD NEUROLOGICAL BOWEL NUTRITION STATUS      Incontinent Dysphagia 1 w/ Nectar liquids; aspiration precautions; feeding assistance at meals - reduce distractions Medication  Administration: Whole meds with puree Compensations: Minimize environmental distractions;Slow rate;Small sips/bites;Follow solids with liquid   AMBULATORY STATUS COMMUNICATION OF NEEDS Skin   Limited Assist Verbally Normal                       Personal Care Assistance Level of Assistance  Bathing, Feeding, Dressing Bathing Assistance: Limited assistance Feeding assistance: Independent Dressing Assistance: Limited assistance     Functional Limitations Info  Sight, Hearing, Speech Sight Info: Impaired Hearing Info: Adequate Speech Info: Adequate    SPECIAL CARE FACTORS FREQUENCY  PT (By licensed PT)     PT Frequency: 5x a week              Contractures Contractures Info: Not present    Additional Factors Info  Insulin Sliding Scale, Code Status, Allergies, Isolation Precautions Code Status Info: Full Code Allergies Info: NKA   Insulin Sliding Scale Info: insulin aspart (novoLOG) injection 0-15 Units 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 0.9 %  sodium chloride infusion   Intravenous Once Earleen Newport, MD      . acetaminophen (TYLENOL) tablet 650 mg  650 mg Oral Q6H PRN Lytle Butte, MD       Or  . acetaminophen (TYLENOL) suppository 650 mg  650 mg Rectal Q6H PRN Lytle Butte, MD      . atorvastatin (LIPITOR)  tablet 40 mg  40 mg Oral q1800 Lytle Butte, MD   40 mg at 12/09/15 1656  . Chlorhexidine Gluconate Cloth 2 % PADS 6 each  6 each Topical Q0600 Lytle Butte, MD   6 each at 12/10/15 0600  . dexamethasone (DECADRON) injection 4 mg  4 mg Intravenous Q6H Dustin Flock, MD   4 mg at 12/10/15 1300  . docusate sodium (COLACE) capsule 100 mg  100 mg Oral BID Dustin Flock, MD   100 mg at 12/10/15 1309  . feeding supplement (GLUCERNA SHAKE) (GLUCERNA SHAKE)  liquid 237 mL  237 mL Oral TID BM Dustin Flock, MD   237 mL at 12/10/15 1315  . ferrous sulfate tablet 325 mg  325 mg Oral BID WC Lytle Butte, MD   325 mg at 12/10/15 0846  . fluticasone (FLONASE) 50 MCG/ACT nasal spray 1 spray  1 spray Each Nare Daily Lytle Butte, MD   1 spray at 12/10/15 1000  . guaiFENesin (MUCINEX) 12 hr tablet 1,200 mg  1,200 mg Oral Daily Lytle Butte, MD   1,200 mg at 12/10/15 1309  . insulin aspart (novoLOG) injection 0-15 Units  0-15 Units Subcutaneous TID WC Dustin Flock, MD   5 Units at 12/10/15 1300  . insulin aspart (novoLOG) injection 0-5 Units  0-5 Units Subcutaneous QHS Dustin Flock, MD   3 Units at 12/09/15 2219  . insulin aspart (novoLOG) injection 5 Units  5 Units Subcutaneous TID WC Dustin Flock, MD      . insulin glargine (LANTUS) injection 28 Units  28 Units Subcutaneous Daily Dustin Flock, MD   28 Units at 12/10/15 1000  . ipratropium-albuterol (DUONEB) 0.5-2.5 (3) MG/3ML nebulizer solution 3 mL  3 mL Nebulization Q4H PRN Lytle Butte, MD      . loratadine (CLARITIN) tablet 10 mg  10 mg Oral Daily Lytle Butte, MD   10 mg at 12/10/15 1309  . losartan (COZAAR) tablet 25 mg  25 mg Oral Daily Lytle Butte, MD   25 mg at 12/09/15 0934  . metoprolol succinate (TOPROL-XL) 24 hr tablet 25 mg  25 mg Oral Daily Lytle Butte, MD   25 mg at 12/08/15 1443  . montelukast (SINGULAIR) tablet 10 mg  10 mg Oral QHS Lytle Butte, MD   10 mg at 12/09/15 2221  . mupirocin ointment (BACTROBAN) 2 % 1 application  1 application Nasal BID Lytle Butte, MD   1 application at 81/19/14 1000  . ondansetron (ZOFRAN) tablet 4 mg  4 mg Oral Q6H PRN Lytle Butte, MD       Or  . ondansetron [facility]) injection 4 mg  4 mg Intravenous Q6H PRN Lytle Butte, MD      . oxyCODONE (Oxy IR/ROXICODONE) immediate release tablet 5 mg  5 mg Oral Q4H PRN Lytle Butte, MD   5 mg at 12/10/15 1309  . pantoprazole (PROTONIX) EC tablet 40 mg  40 mg Oral Daily Lytle Butte, MD   40 mg  at 12/10/15 1309  . piperacillin-tazobactam (ZOSYN) IVPB 3.375 g  3.375 g Intravenous Q8H Lytle Butte, MD   3.375 g at 12/10/15 1332  . senna (SENOKOT) tablet 8.6 mg  1 tablet Oral BID Dustin Flock, MD      . sodium chloride flush (NS) 0.9 % injection 3 mL  3 mL Intravenous Q12H Lytle Butte, MD   3 mL at 12/10/15 1000  . tamsulosin (FLOMAX)  capsule 0.8 mg  0.8 mg Oral QPC supper Lytle Butte, MD   0.8 mg at 12/09/15 1656  . vancomycin (VANCOCIN) 1,250 mg in sodium chloride 0.9 % 250 mL IVPB  1,250 mg Intravenous Q18H Lytle Butte, MD   1,250 mg at 12/09/15 2220     Discharge Medications: Please see discharge summary for a list of discharge medications.  Relevant Imaging Results:  Relevant Lab Results:   Additional Information SSN [id number]   Palliative recommendation to follow at the facility.  Ross Ludwig

## 2015-12-10 NOTE — Progress Notes (Addendum)
[facility] at [facility]                                                                                                                                                                                            Patient Demographics   [name], is a 75 y.o. male, DOB - 1940/09/25, YPP:[id number]  Admit date - 12/08/2015   Admitting Physician Lytle Butte, MD  Outpatient Primary MD for the patient is SPARKS,JEFFREY D, MD   LOS - 2  Subjective: Patient more awake. Able to answer questions denies any chest pain  Review of Systems:    CONSTITUTIONAL: No documented fever. No fatigue, weakness. No weight gain, no weight loss.  EYES: No blurry or double vision.  ENT: No tinnitus. No postnasal drip. No redness of the oropharynx.  RESPIRATORY: No cough, no wheeze, no hemoptysis. No dyspnea.  CARDIOVASCULAR: No chest pain. No orthopnea. No palpitations. No syncope.  GASTROINTESTINAL: No nausea, no vomiting or diarrhea. No abdominal pain. No melena or hematochezia.  GENITOURINARY:  No urgency. No frequency. No dysuria. No hematuria. No obstructive symptoms. No discharge. No pain. No significant abnormal bleeding ENDOCRINE: No polyuria or nocturia. No heat or cold intolerance.  HEMATOLOGY: No anemia. No bruising. No bleeding. No purpura. No petechiae INTEGUMENTARY: No rashes. No lesions.  MUSCULOSKELETAL: No arthritis. No swelling. No gout.  NEUROLOGIC: No numbness, tingling, or ataxia. No seizure-type activity.  PSYCHIATRIC: No anxiety. No insomnia. No ADD.     Vitals:   Vitals:   12/09/15 2010 12/09/15 2019 12/10/15 0403 12/10/15 0901  BP: (!) 92/45 123/71 (!) 109/50 (!) 121/56  Pulse: 84 86 82 95  Resp: '20  18 18  '$ Temp: 97.9 F (36.6 C)  97.8 F (36.6 C) 98.8 F (37.1 C)  TempSrc: Oral   Oral  SpO2: 97%  94% 95%  Weight:      Height:        Wt Readings from Last 3 Encounters:  12/09/15 175 lb 12.8 oz (79.7 kg)  12/03/15 223 lb  (101.2 kg)  11/17/15 223 lb (101.2 kg)     Intake/Output Summary (Last 24 hours) at 12/10/15 1252 Last data filed at 12/10/15 0541  Gross per 24 hour  Intake             1037 ml  Output                2 ml  Net             1035 ml    Physical Exam:   GENERAL: Chronically ill-appearing HEAD, EYES, EARS, NOSE AND  THROAT: Atraumatic, normocephalic. Pupils equal and reactive to light. Sclerae anicteric. No conjunctival injection. No oro-pharyngeal erythema.  NECK: Supple. There is no jugular venous distention. No bruits, no lymphadenopathy, no thyromegaly.  HEART: Regular rate and rhythm,. No murmurs, no rubs, no clicks.  LUNGS: Rhonchus breath sounds bilaterally No rales' \\no'$  crackles. No wheezes.  ABDOMEN: Soft, flat, nontender, nondistended. Has good bowel sounds. No hepatosplenomegaly appreciated.  EXTREMITIES: No evidence of any cyanosis, clubbing, or peripheral edema.  +2 pedal and radial pulses bilaterally.  NEUROLOGIC: The patient is drowsy  SKIN: Moist and warm with no rashes appreciated.  Psych: Not anxious, depressed LN: No inguinal LN enlargement    Antibiotics   Anti-infectives    Start     Dose/Rate Route Frequency Ordered Stop   12/09/15 2300  vancomycin (VANCOCIN) 1,250 mg in sodium chloride 0.9 % 250 mL IVPB     1,250 mg 166.7 mL/hr over 90 Minutes Intravenous Every 18 hours 12/09/15 1134     12/08/15 1600  vancomycin (VANCOCIN) IVPB 1000 mg/200 mL premix  Status:  Discontinued     1,000 mg 200 mL/hr over 60 Minutes Intravenous Every 12 hours 12/08/15 1347 12/09/15 1134   12/08/15 1500  piperacillin-tazobactam (ZOSYN) IVPB 3.375 g     3.375 g 12.5 mL/hr over 240 Minutes Intravenous Every 8 hours 12/08/15 1347     12/08/15 0715  vancomycin (VANCOCIN) IVPB 1000 mg/200 mL premix     1,000 mg 200 mL/hr over 60 Minutes Intravenous  Once 12/08/15 0708 12/08/15 0834   12/08/15 0715  piperacillin-tazobactam (ZOSYN) IVPB 3.375 g     3.375 g 12.5 mL/hr over 240 Minutes  Intravenous  Once 12/08/15 0708 12/08/15 0749   12/08/15 0715  levofloxacin (LEVAQUIN) IVPB 750 mg     750 mg 100 mL/hr over 90 Minutes Intravenous  Once 12/08/15 0708 12/08/15 1553      Medications   Scheduled Meds: . sodium chloride   Intravenous Once  . atorvastatin  40 mg Oral q1800  . Chlorhexidine Gluconate Cloth  6 each Topical Q0600  . dexamethasone  4 mg Intravenous Q6H  . docusate sodium  100 mg Oral BID  . feeding supplement (GLUCERNA SHAKE)  237 mL Oral TID BM  . ferrous sulfate  325 mg Oral BID WC  . fluticasone  1 spray Each Nare Daily  . guaiFENesin  1,200 mg Oral Daily  . insulin aspart  0-15 Units Subcutaneous TID WC  . insulin aspart  0-5 Units Subcutaneous QHS  . insulin glargine  28 Units Subcutaneous Daily  . loratadine  10 mg Oral Daily  . losartan  25 mg Oral Daily  . metoprolol succinate  25 mg Oral Daily  . montelukast  10 mg Oral QHS  . mupirocin ointment  1 application Nasal BID  . pantoprazole  40 mg Oral Daily  . piperacillin-tazobactam (ZOSYN)  IV  3.375 g Intravenous Q8H  . sodium chloride flush  3 mL Intravenous Q12H  . tamsulosin  0.8 mg Oral QPC supper  . vancomycin  1,250 mg Intravenous Q18H   Continuous Infusions: PRN Meds:.acetaminophen **OR** acetaminophen, ipratropium-albuterol, ondansetron **OR** ondansetron (ZOFRAN) IV, oxyCODONE   Data Review:   Micro Results Recent Results (from the past 240 hour(s))  Blood culture (routine x 2)     Status: None (Preliminary result)   Collection Time: 12/08/15  7:15 AM  Result Value Ref Range Status   Specimen Description BLOOD L AC  Final   Special Requests   Final  BOTTLES DRAWN AEROBIC AND ANAEROBIC AER 10ML ANA 11ML   Culture NO GROWTH 2 DAYS  Final   Report Status PENDING  Incomplete  Blood culture (routine x 2)     Status: None (Preliminary result)   Collection Time: 12/08/15  7:15 AM  Result Value Ref Range Status   Specimen Description BLOOD R WRIST  Final   Special Requests    Final    BOTTLES DRAWN AEROBIC AND ANAEROBIC AER 10ML ANA 9ML   Culture NO GROWTH 2 DAYS  Final   Report Status PENDING  Incomplete  MRSA PCR Screening     Status: Abnormal   Collection Time: 12/08/15  2:14 PM  Result Value Ref Range Status   MRSA by PCR POSITIVE (A) NEGATIVE Final    Comment:        The GeneXpert MRSA Assay (FDA approved for NASAL specimens only), is one component of a comprehensive MRSA colonization surveillance program. It is not intended to diagnose MRSA infection nor to guide or monitor treatment for MRSA infections. RESULT CALLED TO, READ BACK BY AND VERIFIED WITH: Earlie Server @ 3244 12/08/15 by [facility]     Radiology Reports Dg Chest 2 View  Result Date: 12/08/2015 CLINICAL DATA:  Shortness of breath and cough.  Known lung carcinoma EXAM: CHEST  2 VIEW COMPARISON:  November 09, 2015 chest radiograph; chest CT October 12, 2015 FINDINGS: The known mass in the right base is not well distinguished on this study. There is consolidation throughout the right mid and lower lung zones with moderate effusion on the right. There is also elevation of the right hemidiaphragm. There is atelectatic change in the left base. The nodular opacities noted throughout the lungs on CT are not appreciable by radiography. Heart is upper normal in size with pulmonary vascularity within normal limits. Increased opacity in the sub- carinal region is most likely due to adenopathy. Adenopathy is much better seen on recent CT examination of by radiography. There is atherosclerotic calcification aorta. No blastic or lytic bone lesions are evident. There is degenerative change in both shoulders. IMPRESSION: Increased consolidation in the right mid and lower lung zones extending into the posterior segment of the right upper lobe. There is moderate pleural effusion on the right as well as elevation the right hemidiaphragm. The known mass in the right base is obscured by surrounding consolidation. On  the left, there is atelectasis in the left base. The nodular opacity seen on radiography throughout the lungs are not appreciable by radiography. There is aortic atherosclerosis. Subcarinal adenopathy is noted. Adenopathy in other areas is much better seen on recent CT than on this current radiographic examination. Electronically Signed   By: Lowella Grip III M.D.   On: 12/08/2015 07:18   Ct Chest W Contrast  Result Date: 12/09/2015 CLINICAL DATA:  Pneumonia. 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 Shortness of breath and cough. EXAM: CT CHEST WITH CONTRAST TECHNIQUE: Multidetector CT imaging of the chest was performed during intravenous contrast administration. CONTRAST:  70m ISOVUE-300 IOPAMIDOL (ISOVUE-300) INJECTION 61% COMPARISON:  10/12/2015 FINDINGS: Cardiovascular: The heart is enlarged. There is a small pericardial effusion. Calcific atherosclerotic disease of the coronary arteries and aortic arch is seen. Mediastinum/Nodes: There are enlarged right sided lymph nodes with the largest pretracheal lymph node measuring 22 in short axis, stable. Lungs/Pleura: There has been interval development of loculated right pleural effusion measuring water density. There are secondary atelectatic changes in the right lung base. Nodularity along the pleural  surface of the right upper and right lower lobe likely represents metastatic disease. The previously demonstrated right lower lobe medial mass is not as well seen due to the presence of atelectatic changes multiple bilateral soft tissue masses have decreased in size, likely due to therapy. For example 2 soft tissue masses in the anterior portion of the right upper lobe measuring 11 and 8 mm on the prior CT dated 10/12/2015 have decreased to 5 mm, image 41/151, sequence 3. Similar decrease in size is noted in multiple other bilateral soft tissue pulmonary nodules. There has been interval development of airspace consolidation in the left  lower lobe which may represent atelectasis or infectious consolidation. Upper Abdomen: Numerous coalescing metastatic lesions throughout the liver are more prominent. Musculoskeletal: No chest wall abnormality. No acute or significant osseous findings. IMPRESSION: Interval development of moderate in size water density right pleural effusion with nodular thickening of the posterior pleura, likely metastatic. Interval decrease in the size of multiple bilateral pulmonary masses, likely representing good response to therapy. Interval development of left lower lobe atelectasis versus airspace consolidation. Persistent mediastinal lymphadenopathy. Numerous coalescing metastatic liver lesions, more prominent on today's exam. Electronically Signed   By: Fidela Salisbury M.D.   On: 12/09/2015 16:21   Mr Jeri Cos VH Contrast  Result Date: 11/19/2015 CLINICAL DATA:  Encephalopathy. History of prostate cancer and metastatic lung cancer on chemotherapy. Rectal bleeding. EXAM: MRI HEAD WITHOUT AND WITH CONTRAST TECHNIQUE: Multiplanar, multiecho pulse sequences of the brain and surrounding structures were obtained without and with intravenous contrast. CONTRAST:  29m MULTIHANCE GADOBENATE DIMEGLUMINE 529 MG/ML IV SOLN COMPARISON:  None. FINDINGS: Brain: There is no evidence of acute infarct, midline shift, or extra-axial fluid collection. Moderate cerebral atrophy is noted. Periventricular white matter T2 hyperintensities are nonspecific but may reflect mild chronic small vessel ischemic disease or post treatment changes. Small chronic infarcts are noted in the right paramedian pons and posterior right corona radiata. There are numerous predominantly ring-enhancing brain lesions. A single lesion in the right cerebellum measures 9 mm. There are at least 13 supratentorial lesions. The largest lesion in the right cerebral hemisphere measures 2.2 x 1.8 cm in the frontal lobe (series 14, image 35), with the largest left  hemispheric lesion located in the posterior frontal lobe (series 14, image 46). Multiple lesions demonstrate chronic blood products, with a 1.8 x 1.3 cm left frontal lobe lesion potentially with some subacute blood products (series 14, image 42). The right cerebellar lesion is also hemorrhagic, potentially with subacute blood products. There is mild edema associated with some of the larger supratentorial lesions without significant mass effect. Vascular: Major intracranial vascular flow voids are preserved. Skull and upper cervical spine: No suspicious osseous lesion identified. Sinuses/Orbits: Unremarkable orbits. Clear paranasal sinuses. Small left mastoid effusion. Other: None. IMPRESSION: 1. 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 Mild associated edema without significant mass effect. 2. 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 Electronically Signed   By: ALogan BoresM.D.   On: 11/19/2015 12:18     CBC  Recent Labs Lab 12/08/15 0639 12/09/15 0454 12/10/15 0956  WBC 31.8* 20.3* 22.3*  HGB 12.8* 11.1* 10.7*  HCT 39.6* 34.7* 32.4*  PLT 230 172 167  MCV 75.5* 76.8* 75.5*  MCH 24.4* 24.5* 24.8*  MCHC 32.3 31.9* 32.9  RDW 17.0* 18.2* 17.3*  LYMPHSABS 1.0  --  0.3*  MONOABS 1.6*  --  0.8  EOSABS 0.0  --  0.0  BASOSABS  0.0  --  0.0    Chemistries   Recent Labs Lab 12/08/15 0715 12/09/15 0454 12/10/15 0335 12/10/15 0956  NA 140 141 141 140  K 5.4* 4.8 4.7 4.5  CL 106 107 110 109  CO2 '24 24 25 23  '$ GLUCOSE 299* 369* 261* 294*  BUN 58* 50* 58* 58*  CREATININE 1.12 1.27* 1.22 1.18  CALCIUM 9.1 8.2* 8.2* 8.5*  AST 43*  --   --   --   ALT 51  --   --   --   ALKPHOS 203*  --   --   --   BILITOT 0.9  --   --   --    ------------------------------------------------------------------------------------------------------------------ estimated creatinine clearance is 58.5 mL/min (by C-G formula based on SCr of 1.18  mg/dL). ------------------------------------------------------------------------------------------------------------------  Recent Labs  12/08/15 0639  HGBA1C 8.8*   ------------------------------------------------------------------------------------------------------------------ No results for input(s): CHOL, HDL, LDLCALC, TRIG, CHOLHDL, LDLDIRECT in the last 72 hours. ------------------------------------------------------------------------------------------------------------------ No results for input(s): TSH, T4TOTAL, T3FREE, THYROIDAB in the last 72 hours.  Invalid input(s): FREET3 ------------------------------------------------------------------------------------------------------------------ No results for input(s): VITAMINB12, FOLATE, FERRITIN, TIBC, IRON, RETICCTPCT in the last 72 hours.  Coagulation profile No results for input(s): INR, PROTIME in the last 168 hours.  No results for input(s): DDIMER in the last 72 hours.  Cardiac Enzymes  Recent Labs Lab 12/08/15 0715  TROPONINI 0.06*   ------------------------------------------------------------------------------------------------------------------ Invalid input(s): POCBNP    Assessment & Plan   75 year old African-American gentleman history of stage IV lung cancer with known metastasis who is presenting with shortness of breath  1.Sepsis, Due to pneumonia:CT scan shows likely pneumonia as well as large pleural effusion I will order thoracentesis for further evaluation of the fluid for symptomatic and therapeutic purpose 2. Stage IV lung cancer, patient will have his brain mapping done and received radiation therapy, I have discussed the case with his primary oncologist who feels the patient's prognosis is very poor recommends palliative care input Continue IV Decadron 3. Type 2 diabetes insulin requiring blood sugars continue to be elevated will add pre-meal insulin 4. Essential hypertension continue home  medications  I have updated the patient wife  Code status: Patient was previously full code family has decided to reverse his CODE STATUS, prognosis very poor in this patient I have asked palliative care team to see      Code Status Orders        Start     Ordered   12/08/15 0941  Full code  Continuous     12/08/15 0940    Code Status History    Date Active Date Inactive Code Status Order ID Comments User Context   12/08/2015  9:14 AM 12/08/2015  9:40 AM DNR [id number]  Lytle Butte, MD ED   11/18/2015  1:21 AM 11/23/2015  7:13 PM DNR [id number]  Saundra Shelling, MD ED   03/17/2015  9:34 PM 03/20/2015  9:43 PM DNR [id number]  Vianne Bulls, MD ED   12/28/2014  8:58 PM 01/04/2015  8:21 PM DNR [id number]  Gennaro Africa, MD Inpatient   12/28/2014  7:59 PM 12/28/2014  8:58 PM Full Code [id number]  Gennaro Africa, MD Inpatient    Advance Directive Documentation   Flowsheet Row Most Recent Value  Type of Advance Directive  Healthcare Power of Attorney, Living will  Pre-existing out of facility DNR order (yellow form or pink MOST form)  No data  "MOST" Form in Place?  No data  Consults  Oncology   DVT Prophylaxis  Lovenox   Lab Results  Component Value Date   PLT 167 12/10/2015     Time Spent in minutes   24mn  Greater than 50% of time spent in care coordination and counseling patient regarding the condition and plan of care.   PDustin FlockM.D on 12/10/2015 at 12:52 PM  Between 7am to 6pm - Pager - [phone]  After 6pm go to [email or web address] - password [email or web address]   Office  [phone]

## 2015-12-10 NOTE — Procedures (Signed)
Interventional Radiology Procedure Note  Procedure: Attempt for US guided right thoracentesis. No significant fluid aspirated.   Findings:  US shows mostly lung consolidation at the inferior right thorax, with tiny fluid.  Fluid is either loculated or decreased.   Complications: No immediate Recommendations:  - CXR now. - Routine wound care  - If tissue diagnosis is warranted, consider liver bx  Signed,  Dulcy Fanny. Earleen Newport, DO

## 2015-12-10 NOTE — Progress Notes (Signed)
PT Cancellation Note  Patient Details Name: [name] MRN: [id number] DOB: July 23, 1940   Cancelled Treatment:    Reason Eval/Treat Not Completed: Patient at procedure or test/unavailable. Chart reviewed and RN consulted. Pt is currently out of room for thoracentesis. Will attempt PT evaluation on later date as patient is available.  Lyndel Safe Josephina Melcher PT, DPT   Cheridan Kibler 12/10/2015, 4:42 PM

## 2015-12-10 NOTE — Care Management (Signed)
Admitted to [facility] with the diagnosis of sepsis. Discharged from this facility to [facility] 11/23/15. Lives with wife, Jan [phone]). Dr. Doy Hutching is listed as primary care physician. Goes to the [facility] for lung, liver, and brain cancer. Tumor markings were placed yesterday. Followed by [facility] in the past. Followed by palliative care for goals of care.  [name] RN MSN CCM Care Management [phone]

## 2015-12-10 NOTE — Progress Notes (Signed)
Daily Progress Note   Patient Name: [name]       Date: 12/10/2015 DOB: Nov 10, 1940  Age: 75 y.o. MRN#: [id number] Attending Physician: Dustin Flock, MD Primary Care Physician: Idelle Crouch, MD Admit Date: 12/08/2015  Reason for Consultation/Follow-up: Establishing goals of care  Subjective: Visited with patient this morning at bedside. Drowsy, but wakes easily to voice and oriented. Not interested in eating breakfast. Denies pain. Tells me he is ready to get out of bed with physical therapy.   Spoke with wife briefly this AM via telephone. She tells me that radiation is planned for Friday. She also tells me her and family visited last night and the patient was awake and ate 50% of his meal. Once again, she states "he has a lot of life left in him." Meeting planned with wife and daughter at 2pm. Unfortunately, they did not show up for the meeting. PMT not at [facility] over the weekend, but will follow-up with family Monday 11/13 if patient still hospitalized.   Length of Stay: 2  Current Medications: Scheduled Meds:  . sodium chloride   Intravenous Once  . atorvastatin  40 mg Oral q1800  . Chlorhexidine Gluconate Cloth  6 each Topical Q0600  . dexamethasone  4 mg Intravenous Q6H  . docusate sodium  100 mg Oral BID  . feeding supplement (GLUCERNA SHAKE)  237 mL Oral TID BM  . ferrous sulfate  325 mg Oral BID WC  . fluticasone  1 spray Each Nare Daily  . guaiFENesin  1,200 mg Oral Daily  . insulin aspart  0-15 Units Subcutaneous TID WC  . insulin aspart  0-5 Units Subcutaneous QHS  . insulin glargine  28 Units Subcutaneous Daily  . loratadine  10 mg Oral Daily  . losartan  25 mg Oral Daily  . metoprolol succinate  25 mg Oral Daily  . montelukast  10 mg Oral QHS  . mupirocin  ointment  1 application Nasal BID  . pantoprazole  40 mg Oral Daily  . piperacillin-tazobactam (ZOSYN)  IV  3.375 g Intravenous Q8H  . sodium chloride flush  3 mL Intravenous Q12H  . tamsulosin  0.8 mg Oral QPC supper  . vancomycin  1,250 mg Intravenous Q18H    Continuous Infusions:  PRN Meds: acetaminophen **OR** acetaminophen, ipratropium-albuterol, ondansetron **OR** ondansetron (ZOFRAN)  IV, oxyCODONE  Physical Exam  Constitutional: He is oriented to person, place, and time. He is cooperative.  Cardiovascular: Regular rhythm and normal heart sounds.   Pulmonary/Chest: Effort normal. He has decreased breath sounds.  Abdominal: Soft. Bowel sounds are normal.  Neurological: He is alert and oriented to person, place, and time.  Skin: Skin is warm and dry.  Psychiatric: He has a normal mood and affect. His speech is normal. He is withdrawn. Cognition and memory are normal.  Nursing note and vitals reviewed.          Vital Signs: BP (!) 121/56 (BP Location: Right Arm)   Pulse 95   Temp 98.8 F (37.1 C) (Oral)   Resp 18   Ht 6' (1.829 m)   Wt 79.7 kg (175 lb 12.8 oz)   SpO2 95%   BMI 23.84 kg/m  SpO2: SpO2: 95 % O2 Device: O2 Device: Not Delivered O2 Flow Rate: O2 Flow Rate (L/min): 0 L/min  Intake/output summary:  Intake/Output Summary (Last 24 hours) at 12/10/15 1350 Last data filed at 12/10/15 0541  Gross per 24 hour  Intake              587 ml  Output                2 ml  Net              585 ml   LBM: Last BM Date: 12/05/15 Baseline Weight: Weight: 101.2 kg (223 lb) Most recent weight: Weight: 79.7 kg (175 lb 12.8 oz)       Palliative Assessment/Data: PPS 40%   Flowsheet Rows   Flowsheet Row Most Recent Value  Intake Tab  Referral Department  Hospitalist  Unit at Time of Referral  Oncology Unit  Palliative Care Primary Diagnosis  Cancer  Date Notified  12/09/15  Palliative Care Type  Return patient Palliative Care  Reason for referral  Clarify Goals of  Care  Date of Admission  12/08/15  Date first seen by Palliative Care  12/09/15  # of days Palliative referral response time  0 Day(s)  # of days IP prior to Palliative referral  1  Clinical Assessment  Palliative Performance Scale Score  40%  Psychosocial & Spiritual Assessment  Palliative Care Outcomes  Patient/Family meeting held?  Yes  Who was at the meeting?  wife via telephone  Palliative Care Outcomes  Clarified goals of care, Counseled regarding hospice, Provided advance care planning, Provided psychosocial or spiritual support      Patient Active Problem List   Diagnosis Date Noted  . Palliative care encounter   . DNR (do not resuscitate) discussion   . Aspiration pneumonia of right lower lobe ([place])   . Sepsis ([place]) 12/08/2015  . DNR (do not resuscitate) 11/19/2015  . Palliative care by specialist 11/19/2015  . Brain metastases ([place]) 11/19/2015  . Primary cancer of right lower lobe of lung ([place]) 10/30/2015  . Generalized weakness 10/30/2015  . Metastases to the liver ([place]) 10/20/2015  . Multiple lung nodules 10/20/2015  . Iron deficiency anemia due to chronic blood loss 10/20/2015  . Prostate cancer ([place]) 10/20/2015  . Type 2 diabetes mellitus ([place]) 03/10/2013  . hypercholesterolemia 03/10/2013  . hypertension 03/10/2013  . 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 INGUINAL HERNIA 08/15/2008    Palliative Care Assessment & Plan   Patient Profile: 75 y.o. 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 Recently discharged from [facility] to rehab at [facility]. He had a mechanical fall one week ago with no obvious trauma but has had intermittent right chest pain since the fall. In ED, chest x-ray revealed  pneumonia. Patient was septic. Now on vancomycin, zosyn, and IVF. Patient has been seen by Dr. Rogue Bussing and was to begin WBRT this week but unable to due to fall/admission. He has done poorly with chemotherapy. Per Dr. Sherrine Maples, the patient is terminally ill and discussed this with wife on 11/7. Patient is schedule for brain mapping on Friday, 11/10. Palliative medicine consultation for goals of care.   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  PMT not at [facility] over the weekend, but this NP will follow up Monday, 11/13 if patient still hospitalized.   If discharged over the weekend, please have palliative services follow outpatient.   Goals of Care and Additional Recommendations:  Limitations on Scope of Treatment: Full Scope Treatment  Code Status: FULL   Code Status Orders        Start     Ordered   12/08/15 0941  Full code  Continuous     12/08/15 0940    Code Status History    Date Active Date Inactive Code Status Order ID Comments User Context   12/08/2015  9:14 AM 12/08/2015  9:40 AM DNR [id number]  Lytle Butte, MD ED   11/18/2015  1:21 AM 11/23/2015  7:13 PM DNR [id number]  Saundra Shelling, MD ED   03/17/2015  9:34 PM 03/20/2015  9:43 PM DNR [id number]  Vianne Bulls, MD ED   12/28/2014  8:58 PM 01/04/2015  8:21 PM DNR [id number]  Gennaro Africa, MD Inpatient   12/28/2014  7:59 PM 12/28/2014  8:58 PM Full Code [id number]  Gennaro Africa, MD Inpatient    Advance Directive Documentation   Flowsheet Row Most Recent Value  Type of Advance Directive  Healthcare Power of Attorney, Living will  Pre-existing out of facility DNR order (yellow form or pink MOST form)  No data  "MOST" Form in Place?  No data       Prognosis:  Unable to determine-guarded. 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 Decreased  functional and nutritional status.   Discharge Planning:  [facility] for rehab with Palliative care service follow-up  Care plan was discussed with patient, wife, daughter, case management, RN, and Dr. Posey Pronto.  Thank you for allowing the Palliative Medicine Team to assist in the care of this patient.   Time In: 1530 Time Out: 1605 Total Time 72mn Prolonged Time Billed  no       Greater than 50%  of this time was spent counseling and coordinating care related to the above assessment and plan.  MIhor Dow FNP-C Palliative Medicine Team  Phone: [phone]: [phone] Please contact Palliative Medicine Team phone at [phone] questions and concerns.

## 2015-12-10 NOTE — Progress Notes (Signed)
Inpatient Diabetes Program Recommendations  AACE/ADA: New Consensus Statement on Inpatient Glycemic Control (2015)  Target Ranges:  Prepandial:   less than 140 mg/dL      Peak postprandial:   less than 180 mg/dL (1-2 hours)      Critically ill patients:  140 - 180 mg/dL   Results for SANCHEZ, HEMMER (MRN [id number]) as of 12/10/2015 08:45  Ref. Range 12/09/2015 07:31 12/09/2015 11:38 12/09/2015 17:03 12/09/2015 21:21 12/10/2015 07:42  Glucose-Capillary Latest Ref Range: 65 - 99 mg/dL 372 (H) 366 (H) 283 (H) 272 (H) 270 (H)   Review of Glycemic Control  Current orders for Inpatient glycemic control: Lantus 28 units daily, Novolog 0-15 units TID with meals, Novolog 0-5 units QHS  Inpatient Diabetes Program Recommendations:  Insulin - Basal: Noted Lantus 28 units daily ordered. Patient only recieved Lantus 20 units on 12/09/15 and will receive Lantus 28 units today. Correction (SSI): Since patient is not eating well, please consider changing frequency of CBGs and Novolog 0-15 units to Q4H.  Thanks, Barnie Alderman, RN, MSN, CDE Diabetes Coordinator Inpatient Diabetes Program [phone] (Team Pager from 8am to 5pm)

## 2015-12-11 ENCOUNTER — Ambulatory Visit
Admit: 2015-12-11 | Discharge: 2015-12-11 | Disposition: A | Discharge status: Home / Self Care | Payer: Medicare Other | Attending: Radiation Oncology | Admitting: Radiation Oncology

## 2015-12-11 LAB — BASIC METABOLIC PANEL
Anion gap: 8 (ref 5–15)
BUN: 60 mg/dL — ABNORMAL HIGH (ref 6–20)
CALCIUM: 8.3 mg/dL — AB (ref 8.9–10.3)
CO2: 25 mmol/L (ref 22–32)
CREATININE: 1.17 mg/dL (ref 0.61–1.24)
Chloride: 109 mmol/L (ref 101–111)
GFR calc non Af Amer: 59 mL/min — ABNORMAL LOW (ref >60)
Glucose, Bld: 196 mg/dL — ABNORMAL HIGH (ref 65–99)
Potassium: 4.7 mmol/L (ref 3.5–5.1)
SODIUM: 142 mmol/L (ref 135–145)

## 2015-12-11 LAB — CBC
HEMATOCRIT: 31.6 % — AB (ref 40.0–52.0)
Hemoglobin: 10.3 g/dL — ABNORMAL LOW (ref 13.0–18.0)
MCH: 24.8 pg — ABNORMAL LOW (ref 26.0–34.0)
MCHC: 32.4 g/dL (ref 32.0–36.0)
MCV: 76.5 fL — ABNORMAL LOW (ref 80.0–100.0)
Platelets: 169 10*3/uL (ref 150–440)
RBC: 4.13 MIL/uL — ABNORMAL LOW (ref 4.40–5.90)
RDW: 17.7 % — AB (ref 11.5–14.5)
WBC: 18.7 10*3/uL — ABNORMAL HIGH (ref 3.8–10.6)

## 2015-12-11 LAB — VANCOMYCIN, TROUGH: VANCOMYCIN TR: 19 ug/mL (ref 15–20)

## 2015-12-11 LAB — GLUCOSE, CAPILLARY
GLUCOSE-CAPILLARY: 162 mg/dL — AB (ref 65–99)
GLUCOSE-CAPILLARY: 314 mg/dL — AB (ref 65–99)
GLUCOSE-CAPILLARY: 334 mg/dL — AB (ref 65–99)
Glucose-Capillary: 168 mg/dL — ABNORMAL HIGH (ref 65–99)

## 2015-12-11 MED ORDER — INSULIN ASPART 100 UNIT/ML ~~LOC~~ SOLN
0.0000 [IU] | SUBCUTANEOUS | Status: DC
Start: 2015-12-11 — End: 2015-12-13
  Administered 2015-12-11 (×2): 11 [IU] via SUBCUTANEOUS
  Administered 2015-12-12: 06:00:00 3 [IU] via SUBCUTANEOUS
  Administered 2015-12-12: 2 [IU] via SUBCUTANEOUS
  Administered 2015-12-12: 5 [IU] via SUBCUTANEOUS
  Administered 2015-12-12: 09:00:00 3 [IU] via SUBCUTANEOUS
  Administered 2015-12-12: 2 [IU] via SUBCUTANEOUS
  Administered 2015-12-13: 13:00:00 5 [IU] via SUBCUTANEOUS
  Filled 2015-12-11: qty 5
  Filled 2015-12-11: qty 11
  Filled 2015-12-11: qty 2
  Filled 2015-12-11: qty 5
  Filled 2015-12-11: qty 11
  Filled 2015-12-11: qty 3
  Filled 2015-12-11: qty 2
  Filled 2015-12-11: qty 3

## 2015-12-11 NOTE — Progress Notes (Signed)
Nutrition Follow-up  DOCUMENTATION CODES:   Not applicable  INTERVENTION:  -Encourage po intake -Continue glucerna TID for added nutrition, may need to switch to ensure enlive for additional kcals and protein if unable to meet needs with glucerna -Will ask nursing to document po intake as host/hostess staff will not due to pt being on isolation. May need to consider calorie count.   NUTRITION DIAGNOSIS:   Inadequate oral intake related to poor appetite, chronic illness, cancer and cancer related treatments as evidenced by per patient/family report.  ongoing  GOAL:   Patient will meet greater than or equal to 90% of their needs  Not meeting needs per evaluation this am  MONITOR:   PO intake, I & O's, Labs, Weight trends, Supplement acceptance  REASON FOR ASSESSMENT:   Malnutrition Screening Tool    ASSESSMENT:    Noted palliative care following.   Patient with full breakfast tray at bedside this am on visit.  Patient reports did not eat breakfast due to, " too many alarms and noises going on."  Offered to get pt fresh breakfast tray and refused.  Asked if he as drinking glucerna shake and he reported yes.  Medications reviewed: decadron, colace, Fe sulfate, aspart insulin, lantus Labs reviewed: glucose 196, BUN 60, creatinine 1.17 WDL  Diet Order:  DIET DYS 3 Room service appropriate? Yes with Assist; Fluid consistency: Thin  Skin:  Reviewed, no issues  Last BM:  11/9  Height:   Ht Readings from Last 1 Encounters:  12/08/15 6' (1.829 m)    Weight:   Wt Readings from Last 1 Encounters:  12/09/15 175 lb 12.8 oz (79.7 kg)    Ideal Body Weight:  80.9 kg  BMI:  Body mass index is 23.84 kg/m.  Estimated Nutritional Needs:   Kcal:  6384-6659 (30-35 cal/kg measured bed weight)  Protein:  101-132 (1.3-1.7g/kg)  Fluid:  >/= 2L  EDUCATION NEEDS:   No education needs identified at this time  Jamesina Gaugh B. Zenia Resides, [place], [place] (pager)

## 2015-12-11 NOTE — Progress Notes (Signed)
Pharmacy Antibiotic Note  [name] is a 75 y.o. male admitted on 12/08/2015 with pneumonia/HCAP.  Pharmacy has been consulted for Vancomycin and Zosyn dosing. Patient from [facility], Hx of Lung Ca w/ mets.  Plan: After discussion with Dr. Posey Pronto re deescalation, will continue current abx for now.   Vancomycin trough at goal with the third dose so will continue current dose and check another trough with the 5th dose.   Continue Zosyn at current dose.     Height: 6' (182.9 cm) Weight: 175 lb 12.8 oz (79.7 kg) IBW/kg (Calculated) : 77.6  Temp (24hrs), Avg:98.6 F (37 C), Min:98.1 F (36.7 C), Max:99 F (37.2 C)   Recent Labs Lab 12/08/15 0639 12/08/15 0715 12/08/15 1120 12/09/15 0454 12/09/15 0746 12/10/15 0335 12/10/15 0956 12/11/15 0327 12/11/15 1014  WBC 31.8*  --   --  20.3*  --   --  22.3* 18.7*  --   CREATININE  --  1.12  --  1.27*  --  1.22 1.18 1.17  --   LATICACIDVEN  --  2.5* 2.5* 2.3* 2.2*  --   --   --   --   VANCOTROUGH  --   --   --   --   --   --   --   --  19    Estimated Creatinine Clearance: 59 mL/min (by C-G formula based on SCr of 1.17 mg/dL).    No Known Allergies  Antimicrobials this admission: Levaquin 750 x 1 in ER Vanc  11/7>>   Zosyn  11/7 >>    Dose adjustments this admission:     Microbiology results:  11/7 BCx: NGTD   UCx:      Sputum:    11/7 MRSA PCR: POSITIVE   Thank you for allowing pharmacy to be a part of this patient's care.  Ulice Dash D 12/11/2015 1:50 PM

## 2015-12-11 NOTE — Progress Notes (Addendum)
CSW Evette Cristal [phone]) notified patient's wife requesting [facility] and [facility] as top choices for new bed placement.

## 2015-12-11 NOTE — Clinical Social Work Note (Signed)
MSW spoke to patient's wife, she does not want patient to return to [facility].  She would like MSW to look for different placement for patient.  MSW was given permission to fax information to other SNFs in [facility].  Jones Broom. Norval Morton, MSW [phone]  Mon-Fri 8a-4:30p 12/11/2015 12:36 PM

## 2015-12-11 NOTE — Evaluation (Signed)
Physical Therapy Evaluation Patient Details Name: [name] MRN: [id number] DOB: 1940/08/16 Today's Date: 12/11/2015   History of Present Illness  Pt is a 75 y/o M with a known history of Stage IV lung cancer followed with [name], and planning whole brain radiation who is presenting from [facility] with shortness of breath.  Recently discharged from [facility] to [facility] for rehabilitation, he suffered a mechanical fall about a week ago with no obvious trauma.       Clinical Impression  Pt admitted with above diagnosis. Pt currently with functional limitations due to the deficits listed below (see PT Problem List). [name] presents with generalized weakness and is quick to fatigue sitting EOB, requiring up to max assist to avoid posterior LOB.  Unsure when pt was last ambulatory as wife unable to answer this specific question, but per chart review pt may have been ambulating in 11/2015.  Given pt's current mobility status, recommending SNF at d/c.  Pt will benefit from skilled PT to increase their independence and safety with mobility to allow discharge to the venue listed below.      Follow Up Recommendations SNF    Equipment Recommendations  Other (comment) (TBD at next venue of care)    Recommendations for Other Services       Precautions / Restrictions Precautions Precautions: Fall Restrictions Weight Bearing Restrictions: No      Mobility  Bed Mobility Overal bed mobility: Needs Assistance Bed Mobility: Supine to Sit;Sit to Supine     Supine to sit: Mod assist;+2 for physical assistance;HOB elevated Sit to supine: Max assist;+2 for physical assistance   General bed mobility comments: Assist to elevate trunk and use of bed pad to assist pt in scooting to EOB.  Pt relies heavily on bed rail.    Transfers                 General transfer comment: Unable to attempt  Ambulation/Gait                Stairs             Wheelchair Mobility    Modified Rankin (Stroke Patients Only)       Balance Overall balance assessment: Needs assistance Sitting-balance support: Bilateral upper extremity supported;Feet supported Sitting balance-Leahy Scale: Poor Sitting balance - Comments: Pt requires BUE support on bed on holding onto knees to remain upright with min guard>max assist to support trunk due to posterior and R lateral lean.  Pt able to initially follow cues for anterior lean and upright posture but as he fatigues he requires more assist.  Pt sat EOB ~8 minutes Postural control: Posterior lean;Right lateral lean                                   Pertinent Vitals/Pain Pain Assessment: No/denies pain Pain Intervention(s): Limited activity within patient's tolerance;Monitored during session    Home Living Family/patient expects to be discharged to:: Skilled nursing facility                      Prior Function Level of Independence: Needs assistance   Gait / Transfers Assistance Needed: Over the past week pt has not been ambulating and has required total assist to pivot to the [facility] which he could not self propel. Wife unable to tell this PT when the last time pt ambulated without assist.  Per  chart review in late october 2017 pt reports he was able to use cane or RW to ambulate.  ADL's / Homemaking Assistance Needed: Pt could wash up his upper body but needed assist with the remainder.          Hand Dominance   Dominant Hand: Right    Extremity/Trunk Assessment   Upper Extremity Assessment: Generalized weakness           Lower Extremity Assessment: Generalized weakness      Cervical / Trunk Assessment: Kyphotic  Communication   Communication: No difficulties  Cognition Arousal/Alertness: Lethargic Behavior During Therapy: WFL for tasks assessed/performed Overall Cognitive Status: Within Functional Limits for tasks assessed                       General Comments      Exercises General Exercises - Upper Extremity Shoulder Flexion: AROM;Both;10 reps;Supine;Limitations Shoulder Flexion Limitations: Fatigues after ~5 reps General Exercises - Lower Extremity Hip ABduction/ADduction: AAROM;Both;10 reps;Supine Straight Leg Raises: AAROM;Both;10 reps;Supine   Assessment/Plan    PT Assessment Patient needs continued PT services  PT Problem List Decreased strength;Decreased activity tolerance;Decreased balance;Decreased mobility;Decreased safety awareness          PT Treatment Interventions DME instruction;Functional mobility training;Therapeutic activities;Balance training;Therapeutic exercise;Gait training;Neuromuscular re-education;Patient/family education;Modalities    PT Goals (Current goals can be found in the Care Plan section)  Acute Rehab PT Goals Patient Stated Goal: to be able to walk PT Goal Formulation: With patient/family Time For Goal Achievement: 12/25/15 Potential to Achieve Goals: Fair    Frequency Min 2X/week   Barriers to discharge        Co-evaluation               End of Session Equipment Utilized During Treatment: Oxygen Activity Tolerance: Patient limited by fatigue Patient left: in bed;with call bell/phone within reach;with bed alarm set;with family/visitor present Nurse Communication: Mobility status;Need for lift equipment         Time: 7001-7494 PT Time Calculation (min) (ACUTE ONLY): 29 min   Charges:   PT Evaluation $PT Eval Low Complexity: 1 Procedure PT Treatments $Therapeutic Activity: 8-22 mins   PT G Codes:        [name] PT, DPT 12/11/2015, 1:09 PM

## 2015-12-11 NOTE — Progress Notes (Signed)
Speech Language Pathology Treatment: Dysphagia  Patient Details Name: VENNIE WAYMIRE MRN: [id number] DOB: 05-06-1940 Today's Date: 12/11/2015 Time: 8295-6213 SLP Time Calculation (min) (ACUTE ONLY): 35 min  Assessment / Plan / Recommendation Clinical Impression  Pt appears to adequately tolerate current po diet following general aspiration precautions w/ no overt s/s of aspiration noted by Wife/pt or NSG staff. Pt is consuming a dysphagia 3 diet w/ thin liquids and is able to help hold cup to feed self but requires support and monitoring d/t Cognitive status and weakness; po intake is inconsistent at times. Discussed w/ wife education on aspiration precautions; food preparation and options. Recommended continue w/ current diet consistency w/ aspiration precautions; meds in Puree for easier swallowing at this time. Pt appears at his baseline for swallowing w/ no further skilled ST services indicated at this time. Pt will need monitoring and support during meals d/t his Cognitive staus and weakness. NSG updated. Wife/pt agreed.   HPI HPI: Pt is a 75 y.o. male with a known history of Stage IV lung cancer followed with Dr.B, and planning whole brain radiation who is presenting from [facility] with shortness of breath. Patient's wife is at bedside and provides most of the history. Recently discharged from [facility] to [facility] for rehabilitation, he suffered a mechanical fall about a week ago with no obvious trauma but has been complaining of intermittent pain in the right chest since the incident. Now for the last 2 days duration has been experiencing cough and shortness of breath. No fevers chills, positive generalized weakness. 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 He had been under Dr. Aletha Halim care, and was slated to begin WBRT this week, but could not make it due to a fall and  subsequent admission to rehab. He is now back with worsening SOB. He had done poorly with chemo and his ECOG PS currently would deter any treatment options includin Radiation to the brain metastasis. Pt has been tolerating current dysphagia 3 diet w/ thin liquids per NSG report; wife.       SLP Plan  All goals met     Recommendations  Diet recommendations: Dysphagia 3 (mechanical soft);Thin liquid Liquids provided via: Cup (no straw if coughing noted) Medication Administration: Whole meds with puree Supervision: Patient able to self feed;Staff to assist with self feeding;Full supervision/cueing for compensatory strategies (d/t Cognitive status and weakness) Compensations: Minimize environmental distractions;Slow rate;Small sips/bites;Follow solids with liquid Postural Changes and/or Swallow Maneuvers: Seated upright 90 degrees;Upright 30-60 min after meal                General recommendations:  (Dietician f/u) Oral Care Recommendations: Oral care BID;Staff/trained caregiver to provide oral care Follow up Recommendations: None (at this time re: dysphagia. F/u w/ Cognitive if indicated.) Plan: All goals met       GO               Orinda Kenner, Dawn, CCC-SLP Watson,Katherine 12/11/2015, 2:59 PM

## 2015-12-12 LAB — POCT CBG MONITORING
POCT GLUCOSE (MANUAL ENTRY) KUC: 163 mg/dL — AB (ref 70–99)
POCT Glucose (KUC): 135 mg/dL — AB (ref 70–99)

## 2015-12-12 LAB — POC OCCULT BLOOD, ED: POCT Glucose (KUC): 203 mg/dL — AB (ref 70–99)

## 2015-12-12 LAB — VANCOMYCIN, TROUGH: Vancomycin Tr: 18 ug/mL (ref 15–20)

## 2015-12-12 MED ORDER — INSULIN GLARGINE 100 UNIT/ML ~~LOC~~ SOLN
40.0000 [IU] | Freq: Every day | SUBCUTANEOUS | Status: DC
  Administered 2015-12-13 – 2015-12-14 (×2): 40 [IU] via SUBCUTANEOUS
  Filled 2015-12-12 (×3): qty 0.4

## 2015-12-12 MED ORDER — INSULIN ASPART 100 UNIT/ML ~~LOC~~ SOLN
8.0000 [IU] | Freq: Three times a day (TID) | SUBCUTANEOUS | Status: DC
  Administered 2015-12-12 – 2015-12-14 (×8): 8 [IU] via SUBCUTANEOUS
  Filled 2015-12-12 (×9): qty 8

## 2015-12-12 NOTE — Clinical Social Work Note (Signed)
CSW contacted patient's wife to inform her of bed offers. Unfortunately, the two facilities of preference have declined the patient, which the patient's wife understands. She will contact the CSW once she and her daughter have made a choice.  Santiago Bumpers, MSW, LCSW-A [phone]

## 2015-12-12 NOTE — Progress Notes (Signed)
Pharmacy Antibiotic Note  [name] is a 75 y.o. male admitted on 12/08/2015 with pneumonia/HCAP.  Pharmacy has been consulted for Vancomycin and Zosyn dosing. Patient from [facility], Hx of Lung Ca w/ mets.  Plan: 11/11 2229 vancomycin trough therapeutic. Continue current dose. Pharmacy will continue to follow and adjust as needed to maintain trough 15 to 20 mcg/mL.    Height: 6' (182.9 cm) Weight: 175 lb 12.8 oz (79.7 kg) IBW/kg (Calculated) : 77.6  Temp (24hrs), Avg:98.3 F (36.8 C), Min:98 F (36.7 C), Max:98.6 F (37 C)   Recent Labs Lab 12/08/15 0639 12/08/15 0715 12/08/15 1120 12/09/15 0454 12/09/15 0746 12/10/15 0335 12/10/15 0956 12/11/15 0327 12/11/15 1014 12/12/15 2229  WBC 31.8*  --   --  20.3*  --   --  22.3* 18.7*  --   --   CREATININE  --  1.12  --  1.27*  --  1.22 1.18 1.17  --   --   LATICACIDVEN  --  2.5* 2.5* 2.3* 2.2*  --   --   --   --   --   VANCOTROUGH  --   --   --   --   --   --   --   --  19 18    Estimated Creatinine Clearance: 59 mL/min (by C-G formula based on SCr of 1.17 mg/dL).    No Known Allergies  Antimicrobials this admission: Levaquin 750 x 1 in ER Vanc  11/7>>   Zosyn  11/7 >>    Dose adjustments this admission:     Microbiology results:  11/7 BCx: NGTD   UCx:      Sputum:    11/7 MRSA PCR: POSITIVE   Thank you for allowing pharmacy to be a part of this patient's care.  Laural Benes, Pharm.D., BCPS Clinical Pharmacist 12/12/2015 11:03 PM

## 2015-12-12 NOTE — Progress Notes (Signed)
Patient ID: [name], male   DOB: [birth year], 75 y.o.   MRN: [id number]  [facility] PROGRESS NOTE  [name] EPP:[id number] DOB: [birth year] DOA: 12/08/2015 PCP: Idelle Crouch, MD  HPI/Subjective: Patient offers no complaints. States he's feeling better and no longer has back pain.  Objective: Vitals:   12/12/15 0435 12/12/15 1315  BP: (!) 104/57 (!) 110/51  Pulse: 74 75  Resp: 16 20  Temp: 98 F (36.7 C) 98.3 F (36.8 C)    Intake/Output Summary (Last 24 hours) at 12/12/15 1525 Last data filed at 12/12/15 1108  Gross per 24 hour  Intake              140 ml  Output                0 ml  Net              140 ml   Filed Weights   12/08/15 0637 12/08/15 1357 12/09/15 0914  Weight: 101.2 kg (223 lb) 78 kg (172 lb) 79.7 kg (175 lb 12.8 oz)    ROS: Review of Systems  Constitutional: Negative for chills and fever.  Eyes: Negative for blurred vision.  Respiratory: Negative for cough and shortness of breath.   Cardiovascular: Negative for chest pain.  Gastrointestinal: Positive for constipation. Negative for abdominal pain, diarrhea, nausea and vomiting.  Genitourinary: Negative for dysuria.  Musculoskeletal: Negative for joint pain.  Neurological: Negative for dizziness and headaches.   Exam: Physical Exam  HENT:  Nose: No mucosal edema.  Mouth/Throat: No oropharyngeal exudate or posterior oropharyngeal edema.  Eyes: Conjunctivae, EOM and lids are normal. Pupils are equal, round, and reactive to light.  Neck: No JVD present. Carotid bruit is not present. No edema present. No thyroid mass and no thyromegaly present.  Cardiovascular: S1 normal and S2 normal.  Exam reveals no gallop.   No murmur heard. Pulses:      Dorsalis pedis pulses are 2+ on the right side, and 2+ on the left side.  Respiratory: No respiratory distress. He has decreased breath sounds in the right lower field. He has no wheezes. He has rhonchi in the right middle field and the left  lower field. He has no rales.  GI: Soft. Bowel sounds are normal. There is no tenderness.  Musculoskeletal:       Right ankle: He exhibits swelling.       Left ankle: He exhibits swelling.  Lymphadenopathy:    He has no cervical adenopathy.  Neurological: He is alert. No cranial nerve deficit.  Able to straight leg raise without a problem  Skin: Skin is warm. No rash noted. Nails show no clubbing.  Psychiatric: He has a normal mood and affect.      Data Reviewed: Basic Metabolic Panel:  Recent Labs Lab 12/08/15 0715 12/09/15 0454 12/10/15 0335 12/10/15 0956 12/11/15 0327  NA 140 141 141 140 142  K 5.4* 4.8 4.7 4.5 4.7  CL 106 107 110 109 109  CO2 '24 24 25 23 25  '$ GLUCOSE 299* 369* 261* 294* 196*  BUN 58* 50* 58* 58* 60*  CREATININE 1.12 1.27* 1.22 1.18 1.17  CALCIUM 9.1 8.2* 8.2* 8.5* 8.3*   Liver Function Tests:  Recent Labs Lab 12/08/15 0715  AST 43*  ALT 51  ALKPHOS 203*  BILITOT 0.9  PROT 7.2  ALBUMIN 2.4*    CBC:  Recent Labs Lab 12/08/15 0639 12/09/15 0454 12/10/15 0956 12/11/15 0327  WBC 31.8* 20.3* 22.3* 18.7*  NEUTROABS 29.2*  --  21.1*  --   HGB 12.8* 11.1* 10.7* 10.3*  HCT 39.6* 34.7* 32.4* 31.6*  MCV 75.5* 76.8* 75.5* 76.5*  PLT 230 172 167 169   Cardiac Enzymes:  Recent Labs Lab 12/08/15 0715  TROPONINI 0.06*    CBG:  Recent Labs Lab 12/10/15 2225 12/11/15 0725 12/11/15 1142 12/11/15 1713 12/11/15 2039  GLUCAP 266* 162* 168* 314* 334*    Recent Results (from the past 240 hour(s))  Blood culture (routine x 2)     Status: None (Preliminary result)   Collection Time: 12/08/15  7:15 AM  Result Value Ref Range Status   Specimen Description BLOOD L AC  Final   Special Requests   Final    BOTTLES DRAWN AEROBIC AND ANAEROBIC AER 10ML ANA 11ML   Culture NO GROWTH 4 DAYS  Final   Report Status PENDING  Incomplete  Blood culture (routine x 2)     Status: None (Preliminary result)   Collection Time: 12/08/15  7:15 AM  Result  Value Ref Range Status   Specimen Description BLOOD R WRIST  Final   Special Requests   Final    BOTTLES DRAWN AEROBIC AND ANAEROBIC AER 10ML ANA 9ML   Culture NO GROWTH 4 DAYS  Final   Report Status PENDING  Incomplete  MRSA PCR Screening     Status: Abnormal   Collection Time: 12/08/15  2:14 PM  Result Value Ref Range Status   MRSA by PCR POSITIVE (A) NEGATIVE Final    Comment:        The GeneXpert MRSA Assay (FDA approved for NASAL specimens only), is one component of a comprehensive MRSA colonization surveillance program. It is not intended to diagnose MRSA infection nor to guide or monitor treatment for MRSA infections. RESULT CALLED TO, READ BACK BY AND VERIFIED WITH: Earlie Server @ 0981 12/08/15 by [facility]      Studies: Dg Chest 1 View  Result Date: 12/10/2015 CLINICAL DATA:  75 year old male with a history of loculated right pleural effusion, lung cancer, known metastatic disease. Status post attempt at right-sided thoracentesis EXAM: CHEST 1 VIEW COMPARISON:  Chest x-ray 12/08/2015, CT 12/09/2015 FINDINGS: Dense opacity at the right base unchanged with obscuration the right hemidiaphragm in the right heart border. Pleural parenchymal thickening extending superiorly. No pneumothorax. Similar appearance of the left lung with coarsened interstitial markings and no confluent airspace disease. Calcifications of the aortic arch. IMPRESSION: No pneumothorax status post right-sided thoracentesis attempt. Unchanged opacity at the right base, likely a combination of tumor, consolidation of the lung, and/or loculated pleural fluid. Left lung remains clear. Aortic atherosclerosis. Signed, Dulcy Fanny. Earleen Newport, DO [facility] Electronically Signed   By: Corrie Mckusick D.O.   On: 12/10/2015 17:26   US Thoracentesis Asp Pleural Space W/img Guide  Result Date: 12/10/2015 INDICATION: 75 year old male with right-sided pleural effusion most  likely malignant with recent CT demonstrating small loculated pleural effusion and consolidative changes at the right lung base. Multiple liver masses. EXAM: ULTRASOUND GUIDED RIGHT THORACENTESIS MEDICATIONS: None. COMPLICATIONS: None PROCEDURE: An ultrasound guided thoracentesis was thoroughly discussed with the patient and the patient's family and questions answered. The benefits, risks, alternatives and complications were also discussed. The patient understands and wishes to proceed with the procedure. Written consent was obtained from the patient's wide. Ultrasound was performed to localize a potential site of thoracentesis in the right chest. Only a small pleural effusion was identified. 1% Lidocaine was used for local  anesthesia. With guidance, a Safe-T-Centesis catheter was advanced into the pleural space. No significant fluid could be aspirated. Catheter was removed and a sterile dressing was placed. Final ultrasound survey was performed with images stored and sent to PACs. Patient tolerated the procedure well and remained hemodynamically stable throughout. No complications were encountered and no significant blood loss encountered. FINDINGS: Ultrasound demonstrates consolidative changes at the base of the lung with very small pleural fluid in the right chest. No significant fluid was aspirated. Final ultrasound image demonstrates the consolidative changes and thin pleural fluid. IMPRESSION: Status post attempt of ultrasound-guided right thoracentesis with no significant fluid aspirated. Consolidative changes and very small pleural fluid identified on the ultrasound, likely related to tumor within the lung/consolidation. Signed, Dulcy Fanny. Earleen Newport, DO [facility] Electronically Signed   By: Corrie Mckusick D.O.   On: 12/10/2015 17:23    Scheduled Meds: . atorvastatin  40 mg Oral q1800  . Chlorhexidine Gluconate Cloth  6 each Topical Q0600  .  dexamethasone  4 mg Intravenous Q6H  . docusate sodium  100 mg Oral BID  . enoxaparin (LOVENOX) injection  40 mg Subcutaneous Q24H  . feeding supplement (GLUCERNA SHAKE)  237 mL Oral TID BM  . ferrous sulfate  325 mg Oral BID WC  . fluticasone  1 spray Each Nare Daily  . guaiFENesin  1,200 mg Oral Daily  . insulin aspart  0-15 Units Subcutaneous Q4H while awake  . insulin aspart  8 Units Subcutaneous TID WC  . [START ON 12/13/2015] insulin glargine  40 Units Subcutaneous Daily  . loratadine  10 mg Oral Daily  . losartan  25 mg Oral Daily  . metoprolol succinate  25 mg Oral Daily  . montelukast  10 mg Oral QHS  . mupirocin ointment  1 application Nasal BID  . pantoprazole  40 mg Oral Daily  . piperacillin-tazobactam (ZOSYN)  IV  3.375 g Intravenous Q8H  . senna  1 tablet Oral BID  . sodium chloride flush  3 mL Intravenous Q12H  . tamsulosin  0.8 mg Oral QPC supper  . vancomycin  1,250 mg Intravenous Q18H    Assessment/Plan:  1. Clinical sepsis with pneumonia and pleural effusion. There were unable to get a thoracentesis secondary to fluid being loculated and not enough. Continue aggressive antibiotics. 2. 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 Overall prognosis is poor. Spoke with wife and patient is still full code. Patient on IV Decadron. Radiation therapy will happen in the future. 3. Type 2 diabetes mellitus. Increase Lantus to 40 units daily. Continue sliding scale. 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 BPH on Flomax 6. Hyperlipidemia unspecified on atorvastatin   Code Status:     Code Status Orders        Start     Ordered   12/08/15 0941  Full code  Continuous     12/08/15 0940    Code Status History    Date Active Date Inactive Code Status Order ID Comments User Context   12/08/2015  9:14 AM 12/08/2015  9:40 AM DNR [id number]  Lytle Butte, MD ED   11/18/2015  1:21 AM 11/23/2015  7:13 PM DNR [id number]  Saundra Shelling, MD ED    03/17/2015  9:34 PM 03/20/2015  9:43 PM DNR [id number]  Vianne Bulls, MD ED   12/28/2014  8:58 PM 01/04/2015  8:21 PM DNR [id number]  Gennaro Africa, MD Inpatient   12/28/2014  7:59  PM 12/28/2014  8:58 PM Full Code [id number]  Gennaro Africa, MD Inpatient    Advance Directive Documentation   Flowsheet Row Most Recent Value  Type of Advance Directive  Healthcare Power of Attorney, Living will  Pre-existing out of facility DNR order (yellow form or pink MOST form)  No data  "MOST" Form in Place?  No data      Disposition Plan: To rehabilitation potentially Monday or Tuesday depending on clinical course  Antibiotics:  Vancomycin  Zosyn  Time spent: 35 minutes  [place], [place]

## 2015-12-13 LAB — GLUCOSE, CAPILLARY
GLUCOSE-CAPILLARY: 108 mg/dL — AB (ref 65–99)
GLUCOSE-CAPILLARY: 145 mg/dL — AB (ref 65–99)
GLUCOSE-CAPILLARY: 209 mg/dL — AB (ref 65–99)

## 2015-12-13 LAB — CBC
HCT: 32.9 % — ABNORMAL LOW (ref 40.0–52.0)
Hemoglobin: 10.6 g/dL — ABNORMAL LOW (ref 13.0–18.0)
MCH: 24.8 pg — ABNORMAL LOW (ref 26.0–34.0)
MCHC: 32.3 g/dL (ref 32.0–36.0)
MCV: 76.8 fL — AB (ref 80.0–100.0)
PLATELETS: 151 10*3/uL (ref 150–440)
RBC: 4.28 MIL/uL — AB (ref 4.40–5.90)
RDW: 17.8 % — ABNORMAL HIGH (ref 11.5–14.5)
WBC: 17.3 10*3/uL — AB (ref 3.8–10.6)

## 2015-12-13 LAB — CULTURE, BLOOD (ROUTINE X 2)
Culture: NO GROWTH
Culture: NO GROWTH

## 2015-12-13 LAB — POCT CBG MONITORING: GLUCOSE, FINGERSTICK: 145

## 2015-12-13 LAB — BASIC METABOLIC PANEL
ANION GAP: 6 (ref 5–15)
BUN: 32 mg/dL — ABNORMAL HIGH (ref 6–20)
CO2: 27 mmol/L (ref 22–32)
Calcium: 8.4 mg/dL — ABNORMAL LOW (ref 8.9–10.3)
Chloride: 108 mmol/L (ref 101–111)
Creatinine, Ser: 0.81 mg/dL (ref 0.61–1.24)
GLUCOSE: 112 mg/dL — AB (ref 65–99)
POTASSIUM: 4.8 mmol/L (ref 3.5–5.1)
SODIUM: 141 mmol/L (ref 135–145)

## 2015-12-13 LAB — GLUCOSE, POCT (MANUAL RESULT ENTRY)
POC GLUCOSE: 209 mg/dL — AB (ref 70–99)
POC GLUCOSE: 209 mg/dL — AB (ref 70–99)

## 2015-12-13 MED ORDER — SODIUM CHLORIDE 0.9% FLUSH
3.0000 mL | INTRAVENOUS | Status: DC | PRN
  Administered 2015-12-13 (×2): 3 mL via INTRAVENOUS
  Filled 2015-12-13: qty 3

## 2015-12-13 MED ORDER — FUROSEMIDE 10 MG/ML IJ SOLN
40.0000 mg | Freq: Once | INTRAMUSCULAR | Status: AC
  Administered 2015-12-13: 40 mg via INTRAVENOUS
  Filled 2015-12-13: qty 4

## 2015-12-13 MED ORDER — INSULIN ASPART 100 UNIT/ML ~~LOC~~ SOLN
0.0000 [IU] | Freq: Three times a day (TID) | SUBCUTANEOUS | Status: DC
  Administered 2015-12-13: 5 [IU] via SUBCUTANEOUS
  Administered 2015-12-14: 3 [IU] via SUBCUTANEOUS
  Administered 2015-12-14: 5 [IU] via SUBCUTANEOUS
  Administered 2015-12-15 – 2015-12-16 (×3): 3 [IU] via SUBCUTANEOUS
  Administered 2015-12-16: 18:00:00 8 [IU] via SUBCUTANEOUS
  Administered 2015-12-16: 3 [IU] via SUBCUTANEOUS
  Administered 2015-12-17 (×2): 5 [IU] via SUBCUTANEOUS
  Filled 2015-12-13 (×2): qty 3
  Filled 2015-12-13: qty 5
  Filled 2015-12-13: qty 2
  Filled 2015-12-13: qty 8
  Filled 2015-12-13: qty 5
  Filled 2015-12-13: qty 3
  Filled 2015-12-13 (×2): qty 5
  Filled 2015-12-13: qty 3

## 2015-12-13 MED ORDER — DEXAMETHASONE SODIUM PHOSPHATE 4 MG/ML IJ SOLN
4.0000 mg | Freq: Two times a day (BID) | INTRAMUSCULAR | Status: DC
  Administered 2015-12-13 – 2015-12-14 (×2): 4 mg via INTRAVENOUS
  Filled 2015-12-13 (×2): qty 1

## 2015-12-13 MED ORDER — INSULIN ASPART 100 UNIT/ML ~~LOC~~ SOLN: 0.0000 [IU] | Freq: Every day | SUBCUTANEOUS | Status: DC

## 2015-12-13 MED ORDER — INSULIN ASPART 100 UNIT/ML ~~LOC~~ SOLN: 0.0000 [IU] | Freq: Four times a day (QID) | SUBCUTANEOUS | Status: DC

## 2015-12-13 NOTE — Progress Notes (Signed)
Patient ID: [name], male   DOB: 1940-04-29, 75 y.o.   MRN: [id number]  [facility] PROGRESS NOTE  [name] PFX:[id number] DOB: 1941/01/27 DOA: 12/08/2015 PCP: Idelle Crouch, MD  HPI/Subjective: Patient asked me when he can get out of here. He still has a lot of cough. Some shortness of breath. States he coughs up yellow phlegm but the nurse states that he swallows it.  Objective: Vitals:   12/13/15 0921 12/13/15 1109  BP: (!) 109/53 (!) 112/57  Pulse: 84 94  Resp: 20   Temp: 97.9 F (36.6 C) 98.4 F (36.9 C)    Filed Weights   12/08/15 0637 12/08/15 1357 12/09/15 0914  Weight: 101.2 kg (223 lb) 78 kg (172 lb) 79.7 kg (175 lb 12.8 oz)    ROS: Review of Systems  Constitutional: Negative for chills and fever.  Eyes: Negative for blurred vision.  Respiratory: Positive for cough and shortness of breath.   Cardiovascular: Negative for chest pain.  Gastrointestinal: Negative for abdominal pain, constipation, diarrhea, nausea and vomiting.  Genitourinary: Negative for dysuria.  Musculoskeletal: Negative for joint pain.  Neurological: Negative for dizziness and headaches.   Exam: Physical Exam  HENT:  Nose: No mucosal edema.  Mouth/Throat: No oropharyngeal exudate or posterior oropharyngeal edema.  Eyes: Conjunctivae, EOM and lids are normal. Pupils are equal, round, and reactive to light.  Neck: No JVD present. Carotid bruit is not present. No edema present. No thyroid mass and no thyromegaly present.  Cardiovascular: S1 normal and S2 normal.  Exam reveals no gallop.   No murmur heard. Pulses:      Dorsalis pedis pulses are 2+ on the right side, and 2+ on the left side.  Respiratory: No respiratory distress. He has decreased breath sounds in the right lower field and the left lower field. He has no wheezes. He has rhonchi in the right middle field, the right lower field and the left lower field. He has no rales.  GI: Soft. Bowel sounds are normal.  There is no tenderness.  Musculoskeletal:       Right ankle: He exhibits swelling.       Left ankle: He exhibits swelling.  Lymphadenopathy:    He has no cervical adenopathy.  Neurological: He is alert. No cranial nerve deficit.  Able to straight leg raise without a problem  Skin: Skin is warm. No rash noted. Nails show no clubbing.  Psychiatric: He has a normal mood and affect.      Data Reviewed: Basic Metabolic Panel:  Recent Labs Lab 12/09/15 0454 12/10/15 0335 12/10/15 0956 12/11/15 0327 12/13/15 0445 12/13/15 0818  NA 141 141 140 142 141  --   K 4.8 4.7 4.5 4.7 4.8  --   CL 107 110 109 109 108  --   CO2 '24 25 23 25 27  '$ --   GLUCOSE 369* 261* 294* 196* 112* 145  BUN 50* 58* 58* 60* 32*  --   CREATININE 1.27* 1.22 1.18 1.17 0.81  --   CALCIUM 8.2* 8.2* 8.5* 8.3* 8.4*  --    Liver Function Tests:  Recent Labs Lab 12/08/15 0715  AST 43*  ALT 51  ALKPHOS 203*  BILITOT 0.9  PROT 7.2  ALBUMIN 2.4*    CBC:  Recent Labs Lab 12/08/15 0639 12/09/15 0454 12/10/15 0956 12/11/15 0327 12/13/15 0445  WBC 31.8* 20.3* 22.3* 18.7* 17.3*  NEUTROABS 29.2*  --  21.1*  --   --   HGB 12.8* 11.1* 10.7* 10.3*  10.6*  HCT 39.6* 34.7* 32.4* 31.6* 32.9*  MCV 75.5* 76.8* 75.5* 76.5* 76.8*  PLT 230 172 167 169 151   Cardiac Enzymes:  Recent Labs Lab 12/08/15 0715  TROPONINI 0.06*    CBG:  Recent Labs Lab 12/11/15 1713 12/11/15 2039 12/13/15 0541 12/13/15 0818 12/13/15 1106  GLUCAP 314* 334* 108* 145* 209*    Recent Results (from the past 240 hour(s))  Blood culture (routine x 2)     Status: None   Collection Time: 12/08/15  7:15 AM  Result Value Ref Range Status   Specimen Description BLOOD L AC  Final   Special Requests   Final    BOTTLES DRAWN AEROBIC AND ANAEROBIC AER 10ML ANA 11ML   Culture NO GROWTH 5 DAYS  Final   Report Status 12/13/2015 FINAL  Final  Blood culture (routine x 2)     Status: None   Collection Time: 12/08/15  7:15 AM  Result  Value Ref Range Status   Specimen Description BLOOD R WRIST  Final   Special Requests   Final    BOTTLES DRAWN AEROBIC AND ANAEROBIC AER 10ML ANA 9ML   Culture NO GROWTH 5 DAYS  Final   Report Status 12/13/2015 FINAL  Final  MRSA PCR Screening     Status: Abnormal   Collection Time: 12/08/15  2:14 PM  Result Value Ref Range Status   MRSA by PCR POSITIVE (A) NEGATIVE Final    Comment:        The GeneXpert MRSA Assay (FDA approved for NASAL specimens only), is one component of a comprehensive MRSA colonization surveillance program. It is not intended to diagnose MRSA infection nor to guide or monitor treatment for MRSA infections. RESULT CALLED TO, READ BACK BY AND VERIFIED WITH: Earlie Server @ 1308 12/08/15 by [facility]      Scheduled Meds: . atorvastatin  40 mg Oral q1800  . dexamethasone  4 mg Intravenous Q12H  . docusate sodium  100 mg Oral BID  . enoxaparin (LOVENOX) injection  40 mg Subcutaneous Q24H  . feeding supplement (GLUCERNA SHAKE)  237 mL Oral TID BM  . ferrous sulfate  325 mg Oral BID WC  . fluticasone  1 spray Each Nare Daily  . guaiFENesin  1,200 mg Oral Daily  . insulin aspart  0-15 Units Subcutaneous QID  . insulin aspart  8 Units Subcutaneous TID WC  . insulin glargine  40 Units Subcutaneous Daily  . loratadine  10 mg Oral Daily  . losartan  25 mg Oral Daily  . metoprolol succinate  25 mg Oral Daily  . montelukast  10 mg Oral QHS  . pantoprazole  40 mg Oral Daily  . piperacillin-tazobactam (ZOSYN)  IV  3.375 g Intravenous Q8H  . senna  1 tablet Oral BID  . sodium chloride flush  3 mL Intravenous Q12H  . tamsulosin  0.8 mg Oral QPC supper  . vancomycin  1,250 mg Intravenous Q18H    Assessment/Plan:  1. Clinical sepsis with pneumonia and pleural effusion. There were unable to get a thoracentesis secondary to fluid being loculated and not enough. Continue aggressive antibiotics.Repeat chest x-ray tomorrow. Give 1 dose of Lasix to see if things improve  with regards to his cough. 2. 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 Overall prognosis is poor. Decrease Decadron dose to every 12 hours. Radiation therapy will happen in the future. Repeat chest x-ray tomorrow. 3. Type 2 diabetes mellitus. Increased Lantus to 40 units daily since  yesterday, but since I decreased the steroid dosing should be able to go down to 30 units daily starting tomorrow. Continue sliding scale. 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 BPH on Flomax 6. Hyperlipidemia unspecified on atorvastatin   Code Status:     Code Status Orders        Start     Ordered   12/08/15 0941  Full code  Continuous     12/08/15 0940    Code Status History    Date Active Date Inactive Code Status Order ID Comments User Context   12/08/2015  9:14 AM 12/08/2015  9:40 AM DNR [id number]  Lytle Butte, MD ED   11/18/2015  1:21 AM 11/23/2015  7:13 PM DNR [id number]  Saundra Shelling, MD ED   03/17/2015  9:34 PM 03/20/2015  9:43 PM DNR [id number]  Vianne Bulls, MD ED   12/28/2014  8:58 PM 01/04/2015  8:21 PM DNR [id number]  Gennaro Africa, MD Inpatient   12/28/2014  7:59 PM 12/28/2014  8:58 PM Full Code [id number]  Gennaro Africa, MD Inpatient    Advance Directive Documentation   Flowsheet Row Most Recent Value  Type of Advance Directive  Healthcare Power of Attorney, Living will  Pre-existing out of facility DNR order (yellow form or pink MOST form)  No data  "MOST" Form in Place?  No data      Disposition Plan: To rehabilitation depending on clinical course  Antibiotics:  Vancomycin  Zosyn  Time spent: 25 minutes. Spoke with his wife on the phone.  Loletha Grayer  [employer]

## 2015-12-13 NOTE — Care Management Important Message (Signed)
Important Message  Patient Details  Name: [name] MRN: [id number] Date of Birth: [birth year]   Medicare Important Message Given:  Yes    Alexandre Faries A, RN 12/13/2015, 3:00 PM

## 2015-12-13 NOTE — Progress Notes (Signed)
Pt continues to have frequent loose, congested cough with occasional productive cough with pt swallowing it. MD in and aware. Lasix IV given with no noted change in cough/congestion sound with cough. No acute distress. Wife reports pt taken off lipitor in Sept due to leg pain with MD contacted with order discontinued per wife's request.  Denies pain. Continued expressive aphasia.

## 2015-12-14 ENCOUNTER — Inpatient Hospital Stay: Payer: Medicare Other

## 2015-12-14 LAB — GLUCOSE, CAPILLARY
GLUCOSE-CAPILLARY: 174 mg/dL — AB (ref 65–99)
GLUCOSE-CAPILLARY: 163 mg/dL — AB (ref 65–99)
GLUCOSE-CAPILLARY: 203 mg/dL — AB (ref 65–99)
GLUCOSE-CAPILLARY: 135 mg/dL — AB (ref 65–99)
GLUCOSE-CAPILLARY: 139 mg/dL — AB (ref 65–99)
GLUCOSE-CAPILLARY: 195 mg/dL — AB (ref 65–99)
GLUCOSE-CAPILLARY: 118 mg/dL — AB (ref 65–99)
Glucose-Capillary: 156 mg/dL — ABNORMAL HIGH (ref 65–99)
Glucose-Capillary: 209 mg/dL — ABNORMAL HIGH (ref 65–99)
Glucose-Capillary: 211 mg/dL — ABNORMAL HIGH (ref 65–99)

## 2015-12-14 LAB — CBC
HEMATOCRIT: 33.6 % — AB (ref 40.0–52.0)
HEMOGLOBIN: 10.9 g/dL — AB (ref 13.0–18.0)
MCH: 24.8 pg — AB (ref 26.0–34.0)
MCHC: 32.3 g/dL (ref 32.0–36.0)
MCV: 76.7 fL — AB (ref 80.0–100.0)
Platelets: 154 10*3/uL (ref 150–440)
RBC: 4.39 MIL/uL — AB (ref 4.40–5.90)
RDW: 17.7 % — ABNORMAL HIGH (ref 11.5–14.5)
WBC: 18.3 10*3/uL — ABNORMAL HIGH (ref 3.8–10.6)

## 2015-12-14 LAB — BASIC METABOLIC PANEL
Anion gap: 8 (ref 5–15)
BUN: 38 mg/dL — AB (ref 6–20)
CHLORIDE: 108 mmol/L (ref 101–111)
CO2: 25 mmol/L (ref 22–32)
CREATININE: 0.91 mg/dL (ref 0.61–1.24)
Calcium: 8.6 mg/dL — ABNORMAL LOW (ref 8.9–10.3)
GFR calc Af Amer: >60 mL/min (ref >60)
GFR calc non Af Amer: >60 mL/min (ref >60)
GLUCOSE: 152 mg/dL — AB (ref 65–99)
POTASSIUM: 4.7 mmol/L (ref 3.5–5.1)
Sodium: 141 mmol/L (ref 135–145)

## 2015-12-14 LAB — POCT CBG MONITORING
POCT GLUCOSE (MANUAL ENTRY) KUC: 118 mg/dL — AB (ref 70–99)
POCT GLUCOSE (MANUAL ENTRY) KUC: 164 mg/dL — AB (ref 70–99)
POCT Glucose (KUC): 211 mg/dL — AB (ref 70–99)

## 2015-12-14 MED ORDER — BUDESONIDE 0.25 MG/2ML IN SUSP
0.2500 mg | Freq: Two times a day (BID) | RESPIRATORY_TRACT | Status: DC
  Administered 2015-12-14 – 2015-12-17 (×7): 0.25 mg via RESPIRATORY_TRACT
  Filled 2015-12-14 (×7): qty 2

## 2015-12-14 MED ORDER — DEXAMETHASONE 4 MG PO TABS
4.0000 mg | ORAL_TABLET | Freq: Two times a day (BID) | ORAL | Status: DC
  Administered 2015-12-14 – 2015-12-17 (×6): 4 mg via ORAL
  Filled 2015-12-14 (×7): qty 1

## 2015-12-14 MED ORDER — ACETYLCYSTEINE 20 % IN SOLN
3.0000 mL | Freq: Two times a day (BID) | RESPIRATORY_TRACT | Status: DC
  Administered 2015-12-14: 3 mL via ORAL
  Administered 2015-12-14: 14:00:00 4 mL via ORAL
  Filled 2015-12-14 (×3): qty 4

## 2015-12-14 NOTE — Progress Notes (Signed)
Patient ID: [name], male   DOB: 09/15/1940, 75 y.o.   MRN: [id number]  [facility] PROGRESS NOTE  [name]:[id number] DOB: 1940-11-28 DOA: 12/08/2015 PCP: Jeremiah Crouch, MD  HPI/Subjective: Patient still with some cough and audible wheeze. Answers some yes or no questions but does not elaborate much.  Objective: Vitals:   12/14/15 0949 12/14/15 1300  BP: (!) 108/59 112/70  Pulse: 84 78  Resp: 18 20  Temp: 97.6 F (36.4 C) 98.6 F (37 C)    Filed Weights   12/08/15 0637 12/08/15 1357 12/09/15 0914  Weight: 101.2 kg (223 lb) 78 kg (172 lb) 79.7 kg (175 lb 12.8 oz)    ROS: Review of Systems  Constitutional: Negative for chills and fever.  Eyes: Negative for blurred vision.  Respiratory: Positive for cough and shortness of breath.   Cardiovascular: Negative for chest pain.  Gastrointestinal: Negative for abdominal pain, constipation, diarrhea, nausea and vomiting.  Genitourinary: Negative for dysuria.  Musculoskeletal: Negative for joint pain.  Neurological: Negative for dizziness and headaches.   Exam: Physical Exam  HENT:  Nose: No mucosal edema.  Mouth/Throat: No oropharyngeal exudate or posterior oropharyngeal edema.  Eyes: Conjunctivae, EOM and lids are normal. Pupils are equal, round, and reactive to light.  Neck: No JVD present. Carotid bruit is not present. No edema present. No thyroid mass and no thyromegaly present.  Cardiovascular: S1 normal and S2 normal.  Exam reveals no gallop.   No murmur heard. Pulses:      Dorsalis pedis pulses are 2+ on the right side, and 2+ on the left side.  Respiratory: No respiratory distress. He has decreased breath sounds in the right lower field and the left lower field. He has wheezes in the right middle field, the right lower field, the left middle field and the left lower field. He has no rhonchi. He has no rales.  GI: Soft. Bowel sounds are normal. There is no tenderness.  Musculoskeletal:   Right ankle: He exhibits swelling.       Left ankle: He exhibits swelling.  Lymphadenopathy:    He has no cervical adenopathy.  Neurological: He is alert. No cranial nerve deficit.  Able to straight leg raise without a problem  Skin: Skin is warm. No rash noted. Nails show no clubbing.  Psychiatric: He has a normal mood and affect.      Data Reviewed: Basic Metabolic Panel:  Recent Labs Lab 12/10/15 0335 12/10/15 0956 12/11/15 0327 12/13/15 0445 12/13/15 0818 12/14/15 0411  NA 141 140 142 141  --  141  K 4.7 4.5 4.7 4.8  --  4.7  CL 110 109 109 108  --  108  CO2 '25 23 25 27  '$ --  25  GLUCOSE 261* 294* 196* 112* 145 152*  BUN 58* 58* 60* 32*  --  38*  CREATININE 1.22 1.18 1.17 0.81  --  0.91  CALCIUM 8.2* 8.5* 8.3* 8.4*  --  8.6*   Liver Function Tests:  Recent Labs Lab 12/08/15 0715  AST 43*  ALT 51  ALKPHOS 203*  BILITOT 0.9  PROT 7.2  ALBUMIN 2.4*    CBC:  Recent Labs Lab 12/08/15 0639 12/09/15 0454 12/10/15 0956 12/11/15 0327 12/13/15 0445 12/14/15 0411  WBC 31.8* 20.3* 22.3* 18.7* 17.3* 18.3*  NEUTROABS 29.2*  --  21.1*  --   --   --   HGB 12.8* 11.1* 10.7* 10.3* 10.6* 10.9*  HCT 39.6* 34.7* 32.4* 31.6* 32.9* 33.6*  MCV  75.5* 76.8* 75.5* 76.5* 76.8* 76.7*  PLT 230 172 167 169 151 154   Cardiac Enzymes:  Recent Labs Lab 12/08/15 0715  TROPONINI 0.06*    CBG:  Recent Labs Lab 12/13/15 1106 12/13/15 1630 12/13/15 2043 12/14/15 0731 12/14/15 1123  GLUCAP 209* 209* 195* 118* 211*    Recent Results (from the past 240 hour(s))  Blood culture (routine x 2)     Status: None   Collection Time: 12/08/15  7:15 AM  Result Value Ref Range Status   Specimen Description BLOOD L AC  Final   Special Requests   Final    BOTTLES DRAWN AEROBIC AND ANAEROBIC AER 10ML ANA 11ML   Culture NO GROWTH 5 DAYS  Final   Report Status 12/13/2015 FINAL  Final  Blood culture (routine x 2)     Status: None   Collection Time: 12/08/15  7:15 AM  Result Value  Ref Range Status   Specimen Description BLOOD R WRIST  Final   Special Requests   Final    BOTTLES DRAWN AEROBIC AND ANAEROBIC AER 10ML ANA 9ML   Culture NO GROWTH 5 DAYS  Final   Report Status 12/13/2015 FINAL  Final  MRSA PCR Screening     Status: Abnormal   Collection Time: 12/08/15  2:14 PM  Result Value Ref Range Status   MRSA by PCR POSITIVE (A) NEGATIVE Final    Comment:        The GeneXpert MRSA Assay (FDA approved for NASAL specimens only), is one component of a comprehensive MRSA colonization surveillance program. It is not intended to diagnose MRSA infection nor to guide or monitor treatment for MRSA infections. RESULT CALLED TO, READ BACK BY AND VERIFIED WITH: [name] @ [phone] 12/08/15 by [facility]      Scheduled Meds: . acetylcysteine  3 mL Oral BID  . budesonide (PULMICORT) nebulizer solution  0.25 mg Nebulization BID  . dexamethasone  4 mg Oral BID  . docusate sodium  100 mg Oral BID  . enoxaparin (LOVENOX) injection  40 mg Subcutaneous Q24H  . feeding supplement (GLUCERNA SHAKE)  237 mL Oral TID BM  . ferrous sulfate  325 mg Oral BID WC  . fluticasone  1 spray Each Nare Daily  . guaiFENesin  1,200 mg Oral Daily  . insulin aspart  0-15 Units Subcutaneous TID WC  . insulin aspart  0-5 Units Subcutaneous QHS  . insulin aspart  8 Units Subcutaneous TID WC  . insulin glargine  40 Units Subcutaneous Daily  . loratadine  10 mg Oral Daily  . losartan  25 mg Oral Daily  . metoprolol succinate  25 mg Oral Daily  . montelukast  10 mg Oral QHS  . pantoprazole  40 mg Oral Daily  . piperacillin-tazobactam (ZOSYN)  IV  3.375 g Intravenous Q8H  . senna  1 tablet Oral BID  . sodium chloride flush  3 mL Intravenous Q12H  . tamsulosin  0.8 mg Oral QPC supper    Assessment/Plan:  1. Clinical sepsis with pneumonia and pleural effusion. Repeat chest x-ray read as negative. Patient with bilateral wheezing today. Add budesonide nebulizers and Mucomyst nebulizers. I think  the patient is chronically aspirating. Continue Zosyn for now. Stop vancomycin. 2. 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 Overall prognosis is poor. Change to oral Decadron dose to every 12 hours. Radiation therapy will happen in the future.  3. Type 2 diabetes mellitus. Increased Lantus to 40 units daily since yesterday.  Continue sliding scale. 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 BPH on Flomax   Code Status:     Code Status Orders        Start     Ordered   12/08/15 0941  Full code  Continuous     12/08/15 0940    Code Status History    Date Active Date Inactive Code Status Order ID Comments User Context   12/08/2015  9:14 AM 12/08/2015  9:40 AM DNR [id number]  Lytle Butte, MD ED   11/18/2015  1:21 AM 11/23/2015  7:13 PM DNR [id number]  Saundra Shelling, MD ED   03/17/2015  9:34 PM 03/20/2015  9:43 PM DNR [id number]  Vianne Bulls, MD ED   12/28/2014  8:58 PM 01/04/2015  8:21 PM DNR [id number]  Gennaro Africa, MD Inpatient   12/28/2014  7:59 PM 12/28/2014  8:58 PM Full Code [id number]  Gennaro Africa, MD Inpatient    Advance Directive Documentation   Flowsheet Row Most Recent Value  Type of Advance Directive  Healthcare Power of Attorney, Living will  Pre-existing out of facility DNR order (yellow form or pink MOST form)  No data  "MOST" Form in Place?  No data      Disposition Plan: To rehabilitation depending on clinical course  Antibiotics:  Zosyn  Time spent: 25 minutes. Spoke with his wife on the phone.  Loletha Grayer  [employer]

## 2015-12-14 NOTE — Progress Notes (Signed)
LCSW followed up with wife and MD. Wife plans to go look at [facility] for SNF services at this time. Will follow up with decision of wife for possible placement.   Lane Hacker, MSW Clinical Social Work: [employer] Coverage for :  [phone]

## 2015-12-14 NOTE — Progress Notes (Signed)
Physical Therapy Treatment Patient Details Name: [name] MRN: [id number] DOB: 09-22-1940 Today's Date: 12/14/2015    History of Present Illness Pt is a 75 y/o M with a known history of Stage IV lung cancer followed with Dr.B, and planning whole brain radiation who is presenting from [facility] with shortness of breath.  Recently discharged from [facility] to [facility] for rehabilitation, he suffered a mechanical fall about a week ago with no obvious trauma.       PT Comments    [name] was agreeable to therapy and completed therapeutic exercises while supine in bed.  Pt becomes lethargic toward end of session and closes his eyes.  He fatigues quickly with exercises, thus unable to attempt bed mobility this date.  SNF remains most appropriate d/c recommendation.   Follow Up Recommendations  SNF     Equipment Recommendations  Other (comment) (TBD at next venue of care)    Recommendations for Other Services       Precautions / Restrictions Precautions Precautions: Fall Restrictions Weight Bearing Restrictions: No    Mobility  Bed Mobility               General bed mobility comments: Pt fatigues with bed level exercises, unable to attempt bed mobility  Transfers                    Ambulation/Gait                 Stairs            Wheelchair Mobility    Modified Rankin (Stroke Patients Only)       Balance                                    Cognition Arousal/Alertness: Lethargic Behavior During Therapy: WFL for tasks assessed/performed Overall Cognitive Status: History of cognitive impairments - at baseline                      Exercises General Exercises - Upper Extremity Shoulder Flexion: AROM;Both;10 reps;Supine;Limitations Elbow Flexion: Strengthening;Both;10 reps;Supine;Other (comment) (with very light manual resistance) Elbow Extension: Strengthening;Both;10  reps;Supine;Other (comment) (with very light manual resistance) General Exercises - Lower Extremity Quad Sets: Strengthening;Both;10 reps;Supine Heel Slides: AROM;Both;10 reps;Supine Hip ABduction/ADduction: AAROM;Both;10 reps;Supine    General Comments General comments (skin integrity, edema, etc.): Pt becomes very lethargic toward end of session closing his eyes       Pertinent Vitals/Pain Pain Assessment: Faces Faces Pain Scale: Hurts little more Pain Location: "knees", "all over" Pain Descriptors / Indicators: Grimacing Pain Intervention(s): Limited activity within patient's tolerance;Monitored during session    Home Living                      Prior Function            PT Goals (current goals can now be found in the care plan section) Acute Rehab PT Goals Patient Stated Goal: to be able to walk Progress towards PT goals: Not progressing toward goals - comment (due to fatigue)    Frequency    Min 2X/week      PT Plan Current plan remains appropriate    Co-evaluation             End of Session   Activity Tolerance: Patient limited by fatigue;Patient limited by lethargy Patient left: in bed;with  call bell/phone within reach;with bed alarm set     Time: 1051-1108 PT Time Calculation (min) (ACUTE ONLY): 17 min  Charges:  $Therapeutic Exercise: 8-22 mins                    G Codes:      Collie Siad PT, DPT 12/14/2015, 11:20 AM

## 2015-12-14 NOTE — Evaluation (Addendum)
Speech Language Pathology Treatment Note:  Patient Details  Name: [name] MRN: [id number] Date of Birth: 1940/12/17  Today's Date: 12/14/2015 Time: SLP Start Time (ACUTE ONLY): 1200 SLP Stop Time (ACUTE ONLY): 1245 SLP Time Calculation (min) (ACUTE ONLY): 45 min  Past Medical History:  Past Medical History:  Diagnosis Date  . Arthritis   . Blood transfusion without reported diagnosis    December 28, 2014 8 units   . Cancer ([place])    liver   . GERD (gastroesophageal reflux disease)   . Gout   . History of blood transfusion    Februrary 2017 4units   . Hyperlipidemia   . Hypertension   . Lung cancer ([place])   . 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 Prostate cancer ([place]) 03/2013   had seed implant and radiation for 5 weeks  . Type 2 diabetes mellitus ([place])   . Wears glasses    Past Surgical History:  Past Surgical History:  Procedure Laterality Date  . COLONOSCOPY  2010  . CYSTOSCOPY N/A 07/04/2013   Procedure: CYSTOSCOPY FLEXIBLE;  Surgeon: Claybon Jabs, MD;  Location: [facility];  Service: Urology;  Laterality: N/A;  . INGUINAL HERNIA REPAIR Left 2005  . LIVER BIOPSY Right 11/02/2015  . PROSTATE BIOPSY    . RADIOACTIVE SEED IMPLANT N/A 07/04/2013   Procedure: RADIOACTIVE SEED IMPLANT;  Surgeon: Claybon Jabs, MD;  Location: [facility];  Service: Urology;  Laterality: N/A;  . REPAIR CHRONIC INCARCERATED RECURRENT LEFT INGUINAL HERNIA  10-23-2008  . TRANSURETHRAL RESECTION OF BLADDER TUMOR N/A 01/02/2015   Procedure: Cystoscopy clot evalucation and fulgration;  Surgeon: Kathie Rhodes, MD;  Location: [facility];  Service: Urology;  Laterality: N/A;   HPI:  Pt is a 75 y.o. male with a known history of Stage IV lung cancer followed with Dr.B, and planning whole brain radiation who is presenting from [facility] with shortness of breath. Patient's wife is at bedside and provides most of the history. Recently discharged from  [facility] to [facility] for rehabilitation, he suffered a mechanical fall about a week ago with no obvious trauma but has been complaining of intermittent pain in the right chest since the incident. Now for the last 2 days duration has been experiencing cough and shortness of breath. No fevers chills, positive generalized weakness. 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 He had been under Dr. Aletha Halim care, and was slated to begin WBRT this week, but could not make it due to a fall and subsequent admission to rehab. He is now back with worsening SOB. He had done poorly with chemo and his ECOG PS currently would deter any treatment options includin Radiation to the brain metastasis. Pt has been tolerating current diet per report, however, MD concerned re: pt's upper airway congestion noted. Pt also has frequently expressed not wanting much po intake and only eats bites/sips. Reviewed chart/MD notes indicating initiating tx for Ca metastaized to the brain and Lasix given for increased congestion in upper airway. Due to concern for aspiration, diet consistency has been modified.    Assessment / Plan / Recommendation Clinical Impression   Pt was noted to have upper respiratory congestion by MD; no changes in lung status reported. MD is receiving breathing txs and medications are being adjusted; pt has received Lasix per NSG report d/t increased fluids and also noted IV fluids on hold  during the time of this session. Ca has metastasized to the brain. Chart reviewed. Of note, pt has not been eating or taking much po's in the past 2 days per NSG report. After discussion w/ MD re: potential risk for aspiration(?), diet was modified to a Dysphagia level 1 w/ Nectar liquids. Pt appeared to tolerate trials of this consistency diet w/ no immediate, overt s/s of aspiration; clear vocal quality b/t trials. However,  pt did exhibit a crackly sounding cough and respirations x1 during session - similar to the same cough he had upon entering room and moving about in the bed to reposition/sit up prior to po's. Unsure if cough was related the the upper respiratory congestion or to any potential aspiration. Recommend continuing w/ a conservative diet consistency at this time w/ monitoring by ST services. Will f/u w/ an objective assessment if indicated. NSG updated.        Diet Recommendation  Dysphagia 1 w/ Nectar liquids; aspiration precautions; feeding assistance at meals - reduce distractions  Medication Administration: Whole meds with puree Compensations: Minimize environmental distractions;Slow rate;Small sips/bites;Follow solids with liquid    Other  Recommendations Oral Care Recommendations: Oral care BID;Staff/trained caregiver to provide oral care   Follow up Recommendations at Discharge  (TBD)      Frequency and Duration  TBD         Prognosis  Fair     Swallow Study   General HPI: Pt is a 75 y.o. male with a known history of Stage IV lung cancer followed with Dr.B, and planning whole brain radiation who is presenting from [facility] with shortness of breath. Patient's wife is at bedside and provides most of the history. Recently discharged from [facility] to [facility] for rehabilitation, he suffered a mechanical fall about a week ago with no obvious trauma but has been complaining of intermittent pain in the right chest since the incident. Now for the last 2 days duration has been experiencing cough and shortness of breath. No fevers chills, positive generalized weakness. 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 He had been under Dr. Aletha Halim care, and was slated to begin WBRT this week, but could not make it due to a fall and subsequent admission to rehab. He is now back with worsening  SOB. He had done poorly with chemo and his ECOG PS currently would deter any treatment options includin Radiation to the brain metastasis. Pt has been tolerating current diet per report, however, MD concerned re: pt's upper airway congestion noted. Pt also has frequently expressed not wanting much po intake and only eats bites/sips. Reviewed chart/MD notes indicating initiating tx for Ca metastaized to the brain and Lasix given for increased congestion in upper airway. Due to concern for aspiration, diet consistency has been modified.  Temperature Spikes Noted: No (wbc elevated) Oral Cavity - Dentition: Dentures, not available;Missing dentition                                      GO           Orinda Kenner, MS, CCC-SLP Watson,Katherine 12/14/2015,1:38 PM

## 2015-12-15 LAB — BASIC METABOLIC PANEL
ANION GAP: 8 (ref 5–15)
BUN: 35 mg/dL — ABNORMAL HIGH (ref 6–20)
CHLORIDE: 109 mmol/L (ref 101–111)
CO2: 25 mmol/L (ref 22–32)
CREATININE: 0.88 mg/dL (ref 0.61–1.24)
Calcium: 8.6 mg/dL — ABNORMAL LOW (ref 8.9–10.3)
GFR calc non Af Amer: >60 mL/min (ref >60)
Glucose, Bld: 105 mg/dL — ABNORMAL HIGH (ref 65–99)
Potassium: 4.8 mmol/L (ref 3.5–5.1)
SODIUM: 142 mmol/L (ref 135–145)

## 2015-12-15 LAB — CBC
HCT: 31.3 % — ABNORMAL LOW (ref 40.0–52.0)
Hemoglobin: 10.4 g/dL — ABNORMAL LOW (ref 13.0–18.0)
MCH: 25 pg — ABNORMAL LOW (ref 26.0–34.0)
MCHC: 33.1 g/dL (ref 32.0–36.0)
MCV: 75.5 fL — AB (ref 80.0–100.0)
PLATELETS: 154 10*3/uL (ref 150–440)
RBC: 4.14 MIL/uL — AB (ref 4.40–5.90)
RDW: 18.3 % — ABNORMAL HIGH (ref 11.5–14.5)
WBC: 15.9 10*3/uL — AB (ref 3.8–10.6)

## 2015-12-15 LAB — GLUCOSE, CAPILLARY
GLUCOSE-CAPILLARY: 107 mg/dL — AB (ref 65–99)
GLUCOSE-CAPILLARY: 111 mg/dL — AB (ref 65–99)
Glucose-Capillary: 164 mg/dL — ABNORMAL HIGH (ref 65–99)
Glucose-Capillary: 144 mg/dL — ABNORMAL HIGH (ref 65–99)
Glucose-Capillary: 156 mg/dL — ABNORMAL HIGH (ref 65–99)

## 2015-12-15 LAB — HEMOGLOBIN: Hemoglobin: 11.4 g/dL — ABNORMAL LOW (ref 13.0–18.0)

## 2015-12-15 LAB — POCT CBG MONITORING: POCT GLUCOSE (MANUAL ENTRY) KUC: 111 mg/dL — AB (ref 70–99)

## 2015-12-15 MED ORDER — DEXAMETHASONE 4 MG PO TABS: 4.0000 mg | ORAL_TABLET | Freq: Two times a day (BID) | ORAL | 0 refills | Status: DC

## 2015-12-15 MED ORDER — GLUCERNA SHAKE PO LIQD: 237.0000 mL | Freq: Three times a day (TID) | ORAL | 0 refills | Status: DC

## 2015-12-15 MED ORDER — AMOXICILLIN-POT CLAVULANATE 875-125 MG PO TABS
1.0000 | ORAL_TABLET | Freq: Two times a day (BID) | ORAL | Status: DC
  Administered 2015-12-15 – 2015-12-17 (×5): 1 via ORAL
  Filled 2015-12-15 (×6): qty 1

## 2015-12-15 MED ORDER — OXYCODONE HCL 5 MG PO TABS: ORAL_TABLET | ORAL | 0 refills | Status: DC

## 2015-12-15 MED ORDER — INSULIN GLARGINE 100 UNIT/ML ~~LOC~~ SOLN: 10.0000 [IU] | Freq: Every day | SUBCUTANEOUS | 0 refills | Status: DC

## 2015-12-15 MED ORDER — ACETAMINOPHEN 325 MG PO TABS: 650.0000 mg | ORAL_TABLET | Freq: Four times a day (QID) | ORAL | 0 refills | Status: DC | PRN

## 2015-12-15 MED ORDER — ENOXAPARIN SODIUM 40 MG/0.4ML ~~LOC~~ SOLN: 40.0000 mg | SUBCUTANEOUS | 0 refills | Status: DC

## 2015-12-15 MED ORDER — INSULIN ASPART 100 UNIT/ML ~~LOC~~ SOLN
4.0000 [IU] | Freq: Three times a day (TID) | SUBCUTANEOUS | 11 refills | Status: DC
Start: 2015-12-15 — End: 2015-12-21

## 2015-12-15 MED ORDER — BUDESONIDE 0.25 MG/2ML IN SUSP: 0.2500 mg | Freq: Two times a day (BID) | RESPIRATORY_TRACT | 0 refills | Status: DC

## 2015-12-15 MED ORDER — TAMSULOSIN HCL 0.4 MG PO CAPS: 0.8000 mg | ORAL_CAPSULE | Freq: Every day | ORAL | 0 refills | Status: DC

## 2015-12-15 MED ORDER — ACETYLCYSTEINE 20 % IN SOLN: 3.0000 mL | Freq: Two times a day (BID) | RESPIRATORY_TRACT | 0 refills | Status: DC

## 2015-12-15 MED ORDER — DOCUSATE SODIUM 100 MG PO CAPS: 100.0000 mg | ORAL_CAPSULE | Freq: Every day | ORAL | 0 refills | Status: DC | PRN

## 2015-12-15 MED ORDER — INSULIN GLARGINE 100 UNIT/ML ~~LOC~~ SOLN
30.0000 [IU] | Freq: Every day | SUBCUTANEOUS | Status: DC
  Filled 2015-12-15: qty 0.3

## 2015-12-15 MED ORDER — ACETYLCYSTEINE 20 % IN SOLN
3.0000 mL | Freq: Two times a day (BID) | RESPIRATORY_TRACT | Status: DC
  Administered 2015-12-15: 20:00:00 3 mL via ORAL
  Administered 2015-12-15: 08:00:00 4 mL via ORAL
  Administered 2015-12-16 (×2): 3 mL via ORAL
  Filled 2015-12-15 (×3): qty 4

## 2015-12-15 MED ORDER — INSULIN GLARGINE 100 UNIT/ML ~~LOC~~ SOLN
10.0000 [IU] | Freq: Every day | SUBCUTANEOUS | Status: DC
  Administered 2015-12-15: 10:00:00 10 [IU] via SUBCUTANEOUS
  Filled 2015-12-15 (×2): qty 0.1

## 2015-12-15 MED ORDER — AMOXICILLIN-POT CLAVULANATE 875-125 MG PO TABS: 1.0000 | ORAL_TABLET | Freq: Two times a day (BID) | ORAL | 0 refills | Status: DC

## 2015-12-15 MED ORDER — IPRATROPIUM-ALBUTEROL 0.5-2.5 (3) MG/3ML IN SOLN: 3.0000 mL | Freq: Four times a day (QID) | RESPIRATORY_TRACT | 0 refills | Status: DC

## 2015-12-15 NOTE — Discharge Summary (Signed)
[facility] NAME: [name]    MR#:  [id number]  DATE OF BIRTH:  11-30-1940  DATE OF ADMISSION:  12/08/2015 ADMITTING PHYSICIAN: Lytle Butte, MD  DATE OF DISCHARGE: 12/15/2015  PRIMARY CARE PHYSICIAN: Jeremiah,JEFFREY D, MD    ADMISSION DIAGNOSIS:  Lactic acidosis [E87.2] Aspiration pneumonia of right lower lobe, unspecified aspiration pneumonia type ([place]) [J69.0]  DISCHARGE DIAGNOSIS:  Active Problems:   Sepsis ([place])   Palliative care encounter   DNR (do not resuscitate) discussion   Aspiration pneumonia of right lower lobe ([name])   Goals of care, counseling/discussion   SECONDARY DIAGNOSIS:   Past Medical History:  Diagnosis Date  . Arthritis   . Blood transfusion without reported diagnosis    December 28, 2014 8 units   . Cancer ([place])    liver   . GERD (gastroesophageal reflux disease)   . Gout   . History of blood transfusion    Februrary 2017 4units   . Hyperlipidemia   . Hypertension   . Lung cancer ([place])   . 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 Prostate cancer ([place]) 03/2013   had seed implant and radiation for 5 weeks  . Type 2 diabetes mellitus ([place])   . Wears glasses     HOSPITAL COURSE:   1.  Clinical sepsis with aspiration pneumonia and pleural effusion. Patient was placed on aggressive antibiotics with Zosyn and vancomycin. I think the patient is chronically aspirating. Switch over to Augmentin for a few more days. Repeat chest x-ray yesterday negative. Since the patient was wheezing I did prescribe nebulizer treatments with budesonide and Mucomyst. Continue Mucomyst for another 4 days then can stop this medication. I would continue the budesonide and nebulizer treatments. 2. 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 Overall prognosis is poor. Decadron 4 mg every 12 hours. Radiation therapy will happen in the future on the 16th. Palliative care to follow-up at  the facility. Can consider comfort care measures if the patient declines. Right now the patient is still full code. 3. Type 2 diabetes mellitus. Decrease Lantus to 10 units daily and 4 units of short acting insulin prior to meals. The patient is not eating very well because he doesn't like the food. 4. Essential hypertension continue usual home medications 5. BPH on Flomax  6. Pain. Try Tylenol before you try the oxycodone. Call wife before he give the oxycodone. 7. Continue DVT prophylaxis with Lovenox at facility. 8. Dysphagia 1 diet with nectar thick liquids. Magic cup.  DISCHARGE CONDITIONS:   Fair  CONSULTS OBTAINED:  Treatment Team:  Creola Corn, MD  DRUG ALLERGIES:  No Known Allergies  DISCHARGE MEDICATIONS:   Current Discharge Medication List    START taking these medications   Details  acetaminophen (TYLENOL) 325 MG tablet Take 2 tablets (650 mg total) by mouth every 6 (six) hours as needed for mild pain (or Fever >/= 101). Qty: 30 tablet, Refills: 0    acetylcysteine (MUCOMYST) 20 % nebulizer solution Take 3 mLs by nebulization 2 (two) times daily. Qty: 21 mL, Refills: 0    amoxicillin-clavulanate (AUGMENTIN) 875-125 MG tablet Take 1 tablet by mouth every 12 (twelve) hours. Qty: 6 tablet, Refills: 0    budesonide (PULMICORT) 0.25 MG/2ML nebulizer solution Take 2 mLs (0.25 mg total) by nebulization 2 (two) times daily. Qty: 60 mL, Refills: 0    docusate sodium (COLACE) 100 MG capsule Take 1 capsule (  100 mg total) by mouth daily as needed for mild constipation. Qty: 60 capsule, Refills: 0    enoxaparin (LOVENOX) 40 MG/0.4ML injection Inject 0.4 mLs (40 mg total) into the skin daily. Qty: 30 Syringe, Refills: 0    feeding supplement, GLUCERNA SHAKE, (GLUCERNA SHAKE) LIQD Take 237 mLs by mouth 3 (three) times daily between meals. Qty: 90 Can, Refills: 0    insulin aspart (NOVOLOG) 100 UNIT/ML injection Inject 4 Units into the skin 3 (three) times daily with  meals. Qty: 10 mL, Refills: 11    ipratropium-albuterol (DUONEB) 0.5-2.5 (3) MG/3ML SOLN Take 3 mLs by nebulization 4 (four) times daily. Qty: 360 mL, Refills: 0      CONTINUE these medications which have CHANGED   Details  dexamethasone (DECADRON) 4 MG tablet Take 1 tablet (4 mg total) by mouth 2 (two) times daily. Qty: 60 tablet, Refills: 0    insulin glargine (LANTUS) 100 UNIT/ML injection Inject 0.1 mLs (10 Units total) into the skin daily. Qty: 10 mL, Refills: 0    oxyCODONE (ROXICODONE) 5 MG immediate release tablet Please call wife prior to giving Qty: 20 tablet, Refills: 0    tamsulosin (FLOMAX) 0.4 MG CAPS capsule Take 2 capsules (0.8 mg total) by mouth daily after supper. Qty: 60 capsule, Refills: 0      CONTINUE these medications which have NOT CHANGED   Details  aspirin EC 81 MG tablet Take 81 mg by mouth.    ferrous sulfate 325 (65 FE) MG tablet Take 1 tablet (325 mg total) by mouth 2 (two) times daily with a meal. Qty: 60 tablet, Refills: 0    fexofenadine (ALLEGRA) 180 MG tablet Take 180 mg by mouth daily.    Fluticasone Propionate (FLONASE NA) Place 1 spray into the nose daily.     losartan (COZAAR) 25 MG tablet Take 25 mg by mouth daily.     metoprolol succinate (TOPROL-XL) 25 MG 24 hr tablet Take 1 tablet (25 mg total) by mouth daily. Qty: 30 tablet, Refills: 0    montelukast (SINGULAIR) 10 MG tablet Take 1 tablet by mouth at bedtime.     pantoprazole (PROTONIX) 40 MG tablet Take 1 tablet (40 mg total) by mouth daily. Qty: 30 tablet, Refills: 0      STOP taking these medications     albuterol (PROVENTIL HFA;VENTOLIN HFA) 108 (90 Base) MCG/ACT inhaler      furosemide (LASIX) 20 MG tablet      guaiFENesin (MUCINEX) 600 MG 12 hr tablet      insulin NPH Human (HUMULIN N,NOVOLIN N) 100 UNIT/ML injection      metFORMIN (GLUCOPHAGE) 1000 MG tablet      potassium chloride (K-DUR) 10 MEQ tablet          DISCHARGE INSTRUCTIONS:   Follow up with  doctor at facility Follow up with palliative care facility  If you experience worsening of your admission symptoms, develop shortness of breath, life threatening emergency, suicidal or homicidal thoughts you must seek medical attention immediately by calling 911 or calling your MD immediately  if symptoms less severe.  You Must read complete instructions/literature along with all the possible adverse reactions/side effects for all the Medicines you take and that have been prescribed to you. Take any new Medicines after you have completely understood and accept all the possible adverse reactions/side effects.   Please note  You were cared for by a hospitalist during your hospital stay. If you have any questions about your discharge medications or the  care you received while you were in the hospital after you are discharged, you can call the unit and asked to speak with the hospitalist on call if the hospitalist that took care of you is not available. Once you are discharged, your primary care physician will handle any further medical issues. Please note that NO REFILLS for any discharge medications will be authorized once you are discharged, as it is imperative that you return to your primary care physician (or establish a relationship with a primary care physician if you do not have one) for your aftercare needs so that they can reassess your need for medications and monitor your lab values.    Today   CHIEF COMPLAINT:   Chief Complaint  Patient presents with  . Shortness of Breath    HISTORY OF PRESENT ILLNESS:  [name]  is a 75 y.o. male with a known history of Metastatic lung cancer to the liver and brain presents with shortness of breath and found to have pneumonia   VITAL SIGNS:  Blood pressure 115/63, pulse 90, temperature 97.8 F (36.6 C), temperature source Oral, resp. rate 20, height 6' (1.829 m), weight 79.7 kg (175 lb 12.8 oz), SpO2 96 %.    PHYSICAL EXAMINATION:   GENERAL:  75 y.o.-year-old patient lying in the bed with no acute distress.  EYES: Pupils equal, round, reactive to light and accommodation. No scleral icterus.  HEENT: Head atraumatic, normocephalic. Oropharynx and nasopharynx clear.  NECK:  Supple, no jugular venous distention. No thyroid enlargement, no tenderness.  LUNGS: Normal breath sounds bilaterally, no wheezing, rales,rhonchi or crepitation. No use of accessory muscles of respiration.  CARDIOVASCULAR: S1, S2 normal. No murmurs, rubs, or gallops.  ABDOMEN: Soft, non-tender, non-distended. Bowel sounds present. No organomegaly or mass.  EXTREMITIES: 2+ edema. No cyanosis, or clubbing.  NEUROLOGIC: Cranial nerves II through XII are intact. Patient able to straight leg raise without problem Sensation intact. Gait not checked.  PSYCHIATRIC: The patient is alert.  SKIN: No obvious rash, lesion, or ulcer.   DATA REVIEW:   CBC  Recent Labs Lab 12/15/15 0535  WBC 15.9*  HGB 10.4*  HCT 31.3*  PLT 154    Chemistries   Recent Labs Lab 12/15/15 0535  NA 142  K 4.8  CL 109  CO2 25  GLUCOSE 105*  BUN 35*  CREATININE 0.88  CALCIUM 8.6*     Microbiology Results  Results for orders placed or performed during the hospital encounter of 12/08/15  Blood culture (routine x 2)     Status: None   Collection Time: 12/08/15  7:15 AM  Result Value Ref Range Status   Specimen Description BLOOD L AC  Final   Special Requests   Final    BOTTLES DRAWN AEROBIC AND ANAEROBIC AER 10ML ANA 11ML   Culture NO GROWTH 5 DAYS  Final   Report Status 12/13/2015 FINAL  Final  Blood culture (routine x 2)     Status: None   Collection Time: 12/08/15  7:15 AM  Result Value Ref Range Status   Specimen Description BLOOD R WRIST  Final   Special Requests   Final    BOTTLES DRAWN AEROBIC AND ANAEROBIC AER 10ML ANA 9ML   Culture NO GROWTH 5 DAYS  Final   Report Status 12/13/2015 FINAL  Final  MRSA PCR Screening     Status: Abnormal   Collection  Time: 12/08/15  2:14 PM  Result Value Ref Range Status   MRSA by PCR POSITIVE (A)  NEGATIVE Final    Comment:        The GeneXpert MRSA Assay (FDA approved for NASAL specimens only), is one component of a comprehensive MRSA colonization surveillance program. It is not intended to diagnose MRSA infection nor to guide or monitor treatment for MRSA infections. RESULT CALLED TO, READ BACK BY AND VERIFIED WITH: Earlie Server @ 3013 12/08/15 by [place]:  Dg Chest 1 View  Result Date: 12/14/2015 CLINICAL DATA:  History of lung carcinoma.  Current cough. EXAM: CHEST 1 VIEW COMPARISON:  December 10, 2015 FINDINGS: There is persistent elevation of the right hemidiaphragm. There is a small effusion. There is right base atelectatic change. Lungs elsewhere are clear. Heart is mildly enlarged with pulmonary vascularity within normal limits. There is atherosclerotic calcification in the aorta. No adenopathy. No bone lesions. No pneumothorax. IMPRESSION: Elevation of the right hemidiaphragm with small effusion right base atelectasis. Lungs elsewhere clear. Stable cardiac prominence. Aortic atherosclerosis. Electronically Signed   By: Lowella Grip III M.[name].   On: 12/14/2015 07:22    Management plans discussed with the patient, family and they are in agreement.  CODE STATUS:     Code Status Orders        Start     Ordered   12/08/15 0941  Full code  Continuous     12/08/15 0940    Code Status History    Date Active Date Inactive Code Status Order ID Comments User Context   12/08/2015  9:14 AM 12/08/2015  9:40 AM DNR [id number]  Lytle Butte, MD ED   11/18/2015  1:21 AM 11/23/2015  7:13 PM DNR [id number]  Saundra Shelling, MD ED   03/17/2015  9:34 PM 03/20/2015  9:43 PM DNR [id number]  Vianne Bulls, MD ED   12/28/2014  8:58 PM 01/04/2015  8:21 PM DNR [id number]  Gennaro Africa, MD Inpatient   12/28/2014  7:59 PM 12/28/2014  8:58 PM Full Code [id number]  Gennaro Africa, MD Inpatient     Advance Directive Documentation   Flowsheet Row Most Recent Value  Type of Advance Directive  Healthcare Power of Attorney, Living will  Pre-existing out of facility DNR order (yellow form or pink MOST form)  No data  "MOST" Form in Place?  No data      TOTAL TIME TAKING CARE OF THIS PATIENT: 35 minutes.    Loletha Grayer M.[name] on 12/15/2015 at 10:34 AM  Between 7am to 6pm - Pager - [phone]  After 6pm go to [email or web address] - password [email or web address]  [employer] Office  [phone]  CC: Primary care physician; Idelle Crouch, MD

## 2015-12-15 NOTE — Plan of Care (Signed)
Problem: Bowel/Gastric: Goal: Will not experience complications related to bowel motility Outcome: Not Progressing At 1725 pt had large soft brown stool with red streaks and blood clots. MD notified. Orders received.

## 2015-12-15 NOTE — Progress Notes (Signed)
New referral for Palliative services at [facility] following discharge received from [place] [facility] [place] NP Ihor Dow. [facility] referral made aware. Thank you. Flo Shanks RN, BSN, [facility] [phone] c

## 2015-12-15 NOTE — Progress Notes (Signed)
[place] made follow up visit. Pt was not as engaged as earlier visits. The  cancer was moving to his brain. (pr nurse) [place] provided the ministry of presence, conversation and prayer. [facility] is available for follow up as needed.    12/15/15 1200  Clinical Encounter Type  Visited With Patient;Health care provider  Visit Type Follow-up;Spiritual support  Referral From Nurse  Spiritual Encounters  Spiritual Needs Prayer

## 2015-12-15 NOTE — Discharge Instructions (Addendum)
Aspiration Pneumonia °Aspiration pneumonia is an infection in your lungs. It occurs when food, liquid, or stomach contents (vomit) are inhaled (aspirated) into your lungs. When these things get into your lungs, swelling (inflammation) and infection can occur. This can make it difficult for you to breathe. Aspiration pneumonia is a serious condition and can be life threatening. °What increases the risk? °Aspiration pneumonia is more likely to occur when a person's cough (gag) reflex or ability to swallow has been decreased. Some things that can do this include: °· Having a brain injury or disease, such as stroke, seizures, Parkinson's disease, dementia, or amyotrophic lateral sclerosis (ALS). °· Being given general anesthetic for procedures. °· Being in a coma (unconscious). °· Having a narrowing of the tube that carries food to the stomach (esophagus). °· Drinking too much alcohol. If a person passes out and vomits, vomit can be swallowed into the lungs. °· Taking certain medicines, such as tranquilizers or sedatives. ° °What are the signs or symptoms? °· Coughing after swallowing food or liquids. °· Breathing problems, such as wheezing or shortness of breath. °· Bluish skin. This can be caused by lack of oxygen. °· Coughing up food or mucus. The mucus might contain blood, greenish material, or yellowish-white fluid (pus). °· Fever. °· Chest pain. °· Being more tired than usual (fatigue). °· Sweating more than usual. °· Bad breath. °How is this diagnosed? °A physical exam will be done. During the exam, the health care provider will listen to your lungs with a stethoscope to check for: °· Crackling sounds in the lungs. °· Decreased breath sounds. °· A rapid heartbeat. ° °Various tests may be ordered. These may include: °· Chest X-ray. °· CT scan. °· Swallowing study. This test looks at how food is swallowed and whether it goes into your breathing tube (trachea) or food pipe (esophagus). °· Sputum culture. Saliva and  mucus (sputum) are collected from the lungs or the tubes that carry air to the lungs (bronchi). The sputum is then tested for bacteria. °· Bronchoscopy. This test uses a flexible tube (bronchoscope) to see inside the lungs. ° °How is this treated? °Treatment will usually include antibiotic medicines. Other medicines may also be used to reduce fever or pain. You may need to be treated in the hospital. In the hospital, your breathing will be carefully monitored. Depending on how well you are breathing, you may need to be given oxygen, or you may need breathing support from a breathing machine (ventilator). For people who fail a swallowing study, a feeding tube might be placed in the stomach, or they may be asked to avoid certain food textures or liquids when they eat. °Follow these instructions at home: °· Carefully follow any special eating instructions you were given, such as avoiding certain food textures or thickening liquids. This reduces the risk of developing aspiration pneumonia again. °· Only take over-the-counter or prescription medicines as directed by your health care provider. Follow the directions carefully. °· If you were prescribed antibiotics, take them as directed. Finish them even if you start to feel better. °· Rest as instructed by your health care provider. °· Keep all follow-up appointments with your health care provider. °Contact a health care provider if: °· You develop worsening shortness of breath, wheezing, or difficulty breathing. °· You develop a fever. °· You have chest pain. °This information is not intended to replace advice given to you by your health care provider. Make sure you discuss any questions you have with your health care   provider. °Document Released: 11/14/2008 Document Revised: 07/01/2015 Document Reviewed: 07/05/2012 °Elsevier Interactive Patient Education © 2017 [employer] ° °

## 2015-12-15 NOTE — Progress Notes (Addendum)
LCSW spoke with patient wife regarding disposition. Wife went and toured facility and in agreement with [facility] with palliative care services following at the facility. Wife and daughter involved in care and assisting with plans for patient.  LCSW will continue to follow and assist with disposition and plans.  Plan:  Discharge to SNF with palliative care at discharge. Will go to [facility].  Patient will discharge today per MD.  All information has been sent to the facility. Wife is aware of plan and in agreement. Patient will transport by EMS.  LCSW will arrange once bed number is given for patient. RN to call report.   Lane Hacker, MSW Clinical Social Work: [employer] Coverage for :  [phone]

## 2015-12-15 NOTE — Progress Notes (Signed)
Speech Language Pathology Treatment: Dysphagia  Patient Details Name: [name] MRN: [id number] DOB: [birth year] Today's Date: 12/15/2015 Time: 0920-1002 SLP Time Calculation (min) (ACUTE ONLY): 42 min  Assessment / Plan / Recommendation Clinical Impression  Pt was seen this morning for toleration of diet post modifying it to Dysphagia 1 w/ Nectar liquids. MD noted a positive change in upper respiratory congestion w/ the medication adjustement. Chart reviewed. Of note, pt has not been eating or taking much po's in the past 2-3 days per NSG report. After discussion w/ NSG re: potential risk for aspiration(?) as well as toleration of breakfast meal this morning, pt was given few trials of puree and Nectar consistency liquids via cup and appeared to tolerate trials w/ no immediate, overt s/s of aspiration; clear vocal quality b/t trials. However, pt only accepted a few po trials then refused and could not state any desired foods or liquids for snack or lunch. Pt required verbal cues to attend to task and follow through w/ task at times - suspect related to ongoing medical dx of Ca. Pt tolerated medication crushed in a puree w/ NSG. Recommend continuing w/ a conservative diet consistency at this time w/ monitoring by ST services for appropriate time for diet consistency upgrade; aspiration precautions. Discussed recommendations w/ NSG.    HPI HPI: Pt is a 75 y.o. male with a known history of [name] IV lung cancer followed with Dr.B, and planning whole brain radiation who is presenting from [facility] with shortness of breath. Patient's wife is at bedside and provides most of the history. Recently discharged from [facility] to [facility] for rehabilitation, he suffered a mechanical fall about a week ago with no obvious trauma but has been complaining of intermittent pain in the right chest since the incident. Now for the last 2 days duration has been experiencing cough and shortness of  breath. No fevers chills, positive generalized weakness. 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 He had been under Dr. Aletha Halim care, and was slated to begin WBRT this week, but could not make it due to a fall and subsequent admission to rehab. He is now back with worsening SOB. He had done poorly with chemo and his ECOG PS currently would deter any treatment options includin Radiation to the brain metastasis. Pt has been tolerating current diet per report, however, MD concerned re: pt's upper airway congestion noted. Pt also has frequently expressed not wanting much po intake and only eats bites/sips. Reviewed chart/MD notes indicating initiating tx for Ca metastaized to the brain and Lasix given for increased congestion in upper airway. Due to concern for aspiration, diet consistency has been modified. MD feels pt's breathing is better today post medication change. Diet remains as ordered.       SLP Plan  Continue with current plan of care     Recommendations  Diet recommendations: Dysphagia 1 (puree);Nectar-thick liquid (to be upgraded as tolerates at Rehab under supervision) Liquids provided via: Cup Medication Administration: Whole meds with puree (or crushed if needed for easier swallowing) Supervision: Staff to assist with self feeding;Full supervision/cueing for compensatory strategies Compensations: Minimize environmental distractions;Slow rate;Small sips/bites;Follow solids with liquid Postural Changes and/or Swallow Maneuvers: Seated upright 90 degrees;Upright 30-60 min after meal                General recommendations:  (Dietician f/u) Oral Care Recommendations: Oral care BID;Staff/trained caregiver to provide oral care  Follow up Recommendations: Skilled Nursing facility (diet consistency upgrade as tolerates) Plan: Continue with current plan of care       [name], [name], CCC-SLP Watson,Katherine 12/15/2015, 1:55 PM

## 2015-12-15 NOTE — Progress Notes (Signed)
Pharmacy Antibiotic Note  [name] is a 75 y.o. male admitted on 12/08/2015 with sepsis.  Pharmacy has been consulted for Zosyn dosing.  Plan: Zosyn EI 3.375g IV Q8hr.    Height: 6' (182.9 cm) Weight: 175 lb 12.8 oz (79.7 kg) IBW/kg (Calculated) : 77.6  Temp (24hrs), Avg:98.1 F (36.7 C), Min:97.8 F (36.6 C), Max:98.6 F (37 C)   Recent Labs Lab 12/08/15 1120  12/09/15 0454 12/09/15 0746  12/10/15 0956 12/11/15 0327 12/11/15 1014 12/12/15 2229 12/13/15 0445 12/14/15 0411 12/15/15 0535  WBC  --   < > 20.3*  --   --  22.3* 18.7*  --   --  17.3* 18.3* 15.9*  CREATININE  --   --  1.27*  --   < > 1.18 1.17  --   --  0.81 0.91 0.88  LATICACIDVEN 2.5*  --  2.3* 2.2*  --   --   --   --   --   --   --   --   VANCOTROUGH  --   --   --   --   --   --   --  19 18  --   --   --   < > = values in this interval not displayed.  Estimated Creatinine Clearance: 78.4 mL/min (by C-G formula based on SCr of 0.88 mg/dL).    No Known Allergies  Antimicrobials this admission: Zosyn  11/7 >>  Vancomycin 11/7 >> 11/12   Microbiology results: 11/7 BCx: no growth   11/7 MRSA PCR: positive  Pharmacy will continue to monitor and adjust per consult.    [name] L 12/15/2015 10:15 AM

## 2015-12-16 LAB — GLUCOSE, CAPILLARY
GLUCOSE-CAPILLARY: 173 mg/dL — AB (ref 65–99)
Glucose-Capillary: 124 mg/dL — ABNORMAL HIGH (ref 65–99)
Glucose-Capillary: 183 mg/dL — ABNORMAL HIGH (ref 65–99)
Glucose-Capillary: 182 mg/dL — ABNORMAL HIGH (ref 65–99)
Glucose-Capillary: 176 mg/dL — ABNORMAL HIGH (ref 65–99)

## 2015-12-16 LAB — BASIC METABOLIC PANEL
Anion gap: 7 (ref 5–15)
BUN: 37 mg/dL — ABNORMAL HIGH (ref 6–20)
CALCIUM: 8.5 mg/dL — AB (ref 8.9–10.3)
CO2: 25 mmol/L (ref 22–32)
CREATININE: 0.85 mg/dL (ref 0.61–1.24)
Chloride: 111 mmol/L (ref 101–111)
GLUCOSE: 195 mg/dL — AB (ref 65–99)
Potassium: 4.7 mmol/L (ref 3.5–5.1)
Sodium: 143 mmol/L (ref 135–145)

## 2015-12-16 LAB — CBC
HEMATOCRIT: 31.4 % — AB (ref 40.0–52.0)
Hemoglobin: 10.3 g/dL — ABNORMAL LOW (ref 13.0–18.0)
MCH: 24.9 pg — ABNORMAL LOW (ref 26.0–34.0)
MCHC: 32.8 g/dL (ref 32.0–36.0)
MCV: 75.8 fL — AB (ref 80.0–100.0)
PLATELETS: 173 10*3/uL (ref 150–440)
RBC: 4.15 MIL/uL — ABNORMAL LOW (ref 4.40–5.90)
RDW: 18.5 % — AB (ref 11.5–14.5)
WBC: 15.9 10*3/uL — ABNORMAL HIGH (ref 3.8–10.6)

## 2015-12-16 LAB — POCT CBG MONITORING: POCT Glucose (KUC): 176 mg/dL — AB (ref 70–99)

## 2015-12-16 MED ORDER — PANTOPRAZOLE SODIUM 40 MG PO TBEC
40.0000 mg | DELAYED_RELEASE_TABLET | Freq: Two times a day (BID) | ORAL | Status: DC
  Administered 2015-12-16 (×2): 40 mg via ORAL
  Filled 2015-12-16 (×3): qty 1

## 2015-12-16 NOTE — Progress Notes (Signed)
Nutrition Follow-up  DOCUMENTATION CODES:   Not applicable  INTERVENTION:  1. Monitor PO intake on CLD, currently on Nectar thick liquids, no clear liquid supplements fit that criteria.  NUTRITION DIAGNOSIS:   Inadequate oral intake related to poor appetite, chronic illness, cancer and cancer related treatments as evidenced by per patient/family report. -ongoing  GOAL:   Patient will meet greater than or equal to 90% of their needs -not meeting  MONITOR:   PO intake, I & O's, Labs, Weight trends, Supplement acceptance  REASON FOR ASSESSMENT:   Malnutrition Screening Tool    ASSESSMENT:   [name]  is a 75 y.o. male with a known history of Stage IV lung cancer followed with Dr.B, and planning whole brain radiation who is presenting from [facility] with shortness of breath  Patient was to d/c yesterday, found to have bloody bowel movement, d/c cancelled. HGB stable. Patient has not been eating, states he does not like food. He is not consuming much on clear liquid diet currently - will try boost breeze, monitor CBGs, currently 173-186. Feels ok right now, just wants to go home. Labs and medications reviewed: Colace, Decadron, Senokot  Diet Order:  Diet clear liquid Room service appropriate? Yes; Fluid consistency: Nectar Thick  Skin:  Reviewed, no issues  Last BM:  11/9  Height:   Ht Readings from Last 1 Encounters:  12/08/15 6' (1.829 m)    Weight:   Wt Readings from Last 1 Encounters:  12/09/15 175 lb 12.8 oz (79.7 kg)    Ideal Body Weight:  80.9 kg  BMI:  Body mass index is 23.84 kg/m.  Estimated Nutritional Needs:   Kcal:  0761-5183 (30-35 cal/kg measured bed weight)  Protein:  101-132 (1.3-1.7g/kg)  Fluid:  >/= 2L  EDUCATION NEEDS:   No education needs identified at this time  [name]. Jeremiah Cavallero, MS, RD LDN Inpatient Clinical Dietitian Pager [phone]

## 2015-12-16 NOTE — Progress Notes (Signed)
Patient ID: [name], male   DOB: 1941-01-05, 75 y.o.   MRN: [id number]   [facility] PROGRESS NOTE  [name]:[id number] DOB: 1940/09/08 DOA: 12/08/2015 PCP: Fulton Reek D, MD  HPI/Subjective: Last night I was called with bloody bowel movement. Discharge was canceled yesterday evening. The patient only had that one bowel movement and no further bowel movements. Patient feels okay and offers no complaints.  Objective: Vitals:   12/15/15 2027 12/16/15 0444  BP: (!) 122/55 (!) 128/59  Pulse: 90 95  Resp: 16 20  Temp: 98.5 F (36.9 C) 98.2 F (36.8 C)     ROS: Review of Systems  Constitutional: Negative for chills and fever.  Eyes: Negative for blurred vision.  Respiratory: Positive for cough. Negative for shortness of breath.   Cardiovascular: Negative for chest pain.  Gastrointestinal: Negative for abdominal pain, constipation, diarrhea, nausea and vomiting.  Genitourinary: Negative for dysuria.  Musculoskeletal: Negative for joint pain.  Neurological: Negative for dizziness and headaches.   Exam: Physical Exam  HENT:  Nose: No mucosal edema.  Mouth/Throat: No oropharyngeal exudate or posterior oropharyngeal edema.  Eyes: Conjunctivae, EOM and lids are normal. Pupils are equal, round, and reactive to light.  Neck: No JVD present. Carotid bruit is not present. No edema present. No thyroid mass and no thyromegaly present.  Cardiovascular: S1 normal and S2 normal.  Exam reveals no gallop.   No murmur heard. Pulses:      Dorsalis pedis pulses are 2+ on the right side, and 2+ on the left side.  Respiratory: No respiratory distress. He has decreased breath sounds in the right lower field and the left lower field. He has wheezes in the right middle field, the right lower field, the left middle field and the left lower field. He has no rhonchi. He has no rales.  GI: Soft. Bowel sounds are normal. There is no tenderness.  Rectal exam brown stool with  bright red blood  Musculoskeletal:       Right ankle: He exhibits swelling.       Left ankle: He exhibits swelling.  Lymphadenopathy:    He has no cervical adenopathy.  Neurological: He is alert. No cranial nerve deficit.  Able to straight leg raise without a problem  Skin: Skin is warm. No rash noted. Nails show no clubbing.  Psychiatric: He has a normal mood and affect.      Data Reviewed: Basic Metabolic Panel:  Recent Labs Lab 12/11/15 0327 12/13/15 0445 12/13/15 0818 12/14/15 0411 12/15/15 0535 12/16/15 0431  NA 142 141  --  141 142 143  K 4.7 4.8  --  4.7 4.8 4.7  CL 109 108  --  108 109 111  CO2 25 27  --  '25 25 25  '$ GLUCOSE 196* 112* 145 152* 105* 195*  BUN 60* 32*  --  38* 35* 37*  CREATININE 1.17 0.81  --  0.91 0.88 0.85  CALCIUM 8.3* 8.4*  --  8.6* 8.6* 8.5*    CBC:  Recent Labs Lab 12/10/15 0956 12/11/15 0327 12/13/15 0445 12/14/15 0411 12/15/15 0535 12/15/15 1754 12/16/15 0431  WBC 22.3* 18.7* 17.3* 18.3* 15.9*  --  15.9*  NEUTROABS 21.1*  --   --   --   --   --   --   HGB 10.7* 10.3* 10.6* 10.9* 10.4* 11.4* 10.3*  HCT 32.4* 31.6* 32.9* 33.6* 31.3*  --  31.4*  MCV 75.5* 76.5* 76.8* 76.7* 75.5*  --  75.8*  PLT  167 169 151 154 154  --  173    CBG:  Recent Labs Lab 12/14/15 1720 12/14/15 2136 12/15/15 0744 12/15/15 0857 12/15/15 1135  GLUCAP 164* 144* 107* 111* 156*    Recent Results (from the past 240 hour(s))  Blood culture (routine x 2)     Status: None   Collection Time: 12/08/15  7:15 AM  Result Value Ref Range Status   Specimen Description BLOOD L AC  Final   Special Requests   Final    BOTTLES DRAWN AEROBIC AND ANAEROBIC AER 10ML ANA 11ML   Culture NO GROWTH 5 DAYS  Final   Report Status 12/13/2015 FINAL  Final  Blood culture (routine x 2)     Status: None   Collection Time: 12/08/15  7:15 AM  Result Value Ref Range Status   Specimen Description BLOOD R WRIST  Final   Special Requests   Final    BOTTLES DRAWN AEROBIC AND  ANAEROBIC AER 10ML ANA 9ML   Culture NO GROWTH 5 DAYS  Final   Report Status 12/13/2015 FINAL  Final  MRSA PCR Screening     Status: Abnormal   Collection Time: 12/08/15  2:14 PM  Result Value Ref Range Status   MRSA by PCR POSITIVE (A) NEGATIVE Final    Comment:        The GeneXpert MRSA Assay (FDA approved for NASAL specimens only), is one component of a comprehensive MRSA colonization surveillance program. It is not intended to diagnose MRSA infection nor to guide or monitor treatment for MRSA infections. RESULT CALLED TO, READ BACK BY AND VERIFIED WITH: Earlie Server @ 3235 12/08/15 by [facility]      Scheduled Meds: . acetylcysteine  3 mL Oral BID  . amoxicillin-clavulanate  1 tablet Oral Q12H  . budesonide (PULMICORT) nebulizer solution  0.25 mg Nebulization BID  . dexamethasone  4 mg Oral BID  . docusate sodium  100 mg Oral BID  . feeding supplement (GLUCERNA SHAKE)  237 mL Oral TID BM  . fluticasone  1 spray Each Nare Daily  . guaiFENesin  1,200 mg Oral Daily  . insulin aspart  0-15 Units Subcutaneous TID WC  . insulin aspart  0-5 Units Subcutaneous QHS  . loratadine  10 mg Oral Daily  . losartan  25 mg Oral Daily  . metoprolol succinate  25 mg Oral Daily  . montelukast  10 mg Oral QHS  . pantoprazole  40 mg Oral BID  . senna  1 tablet Oral BID  . sodium chloride flush  3 mL Intravenous Q12H  . tamsulosin  0.8 mg Oral QPC supper    Assessment/Plan:  1. Gastrointestinal bleed one episode yesterday evening. Patient had no further episodes overnight. Hemoglobin this morning is the same as it was yesterday morning. Rectal exam showing brown stool with bright red blood. Likely a diverticular bleed. Monitor closely for bleeding episodes. Decrease diet to nectar thick liquids today. As discussed with gastroenterology to see. Stop aspirin and Lovenox injections. Patient on Protonix. 2. Clinical sepsis with pneumonia and pleural effusion. Repeat chest x-ray read as negative.  Continue budesonide nebulizers and Mucomyst nebulizers. I think the patient is chronically aspirating. Finish up course with Augmentin 3. 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 Overall prognosis is poor. Oral Decadron 4 mg twice a day Radiation therapy will happen in the future.  4. Type 2 diabetes mellitus. 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 BPH on Flomax   Code Status:     Code Status Orders        Start     Ordered   12/08/15 0941  Full code  Continuous     12/08/15 0940    Code Status History    Date Active Date Inactive Code Status Order ID Comments User Context   12/08/2015  9:14 AM 12/08/2015  9:40 AM DNR [id number]  Lytle Butte, MD ED   11/18/2015  1:21 AM 11/23/2015  7:13 PM DNR [id number]  Saundra Shelling, MD ED   03/17/2015  9:34 PM 03/20/2015  9:43 PM DNR [id number]  Vianne Bulls, MD ED   12/28/2014  8:58 PM 01/04/2015  8:21 PM DNR [id number]  Gennaro Africa, MD Inpatient   12/28/2014  7:59 PM 12/28/2014  8:58 PM Full Code [id number]  Gennaro Africa, MD Inpatient    Advance Directive Documentation   Flowsheet Row Most Recent Value  Type of Advance Directive  Healthcare Power of Attorney, Living will  Pre-existing out of facility DNR order (yellow form or pink MOST form)  No data  "MOST" Form in Place?  No data      Disposition Plan: Now will need to stop bleeding prior to going out to rehabilitation  Antibiotics:  Augmentin  Time spent: 25 minutes. Spoke with his wife on the phone. Case also discussed with gastroenterology and nursing staff.  Loletha Grayer  [employer]

## 2015-12-16 NOTE — Consult Note (Signed)
GI Inpatient Consult Note  Reason for Consult: Rectal bleeding   Attending Requesting Consult: Dr. Earleen Newport  History of Present Illness: [name] is a 75 y.o. 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  Patient received Vanc and Zosyn for aspiration PNA, then switched to Augmentin.  He improved satisfactorily and was scheduled for discharge yesterday, but passed a stool with BRB intermixed prior to discharge.  He was kept inpatient for observation and GI consultation, as he experienced BRBPR during his last admission on 11/18/15.  Endoscopic procedures were not recommended due to his stable Hgb, lack of continued bleeding, and ack of desire for GI services.  The bleeding was presumed diverticular in nature. Patient followed up with GI as an outpatient, and colonoscopy was again not recommended due to recurrent metastatic cancer.  This risks of the procedure likely do not outweigh the benefits, as it would unlikely improve quality of life.  Today, patient has not passed further stools.  No BRBPR.  No abdominal pain, fevers, or chills.  He offers no specific GI complaints.  Past Medical History:  Past Medical History:  Diagnosis Date  . Arthritis   . Blood transfusion without reported diagnosis    December 28, 2014 8 units   . Cancer ([place])    liver   . GERD (gastroesophageal reflux disease)   . Gout   . History of blood transfusion    Februrary 2017 4units   . Hyperlipidemia   . Hypertension   . Lung cancer ([place])   . Neuroendocrine carcinoma metastatic to liver ([place])   . Prostate cancer ([place]) 03/2013   had seed implant and radiation for 5 weeks  . Type 2 diabetes mellitus ([place])   . Wears glasses     Problem List: Patient Active Problem List   Diagnosis Date Noted  . Goals of care, counseling/discussion   . Palliative care encounter   .  DNR (do not resuscitate) discussion   . Aspiration pneumonia of right lower lobe ([place])   . Sepsis ([place]) 12/08/2015  . DNR (do not resuscitate) 11/19/2015  . Palliative care by specialist 11/19/2015  . Brain metastases ([place]) 11/19/2015  . Primary cancer of right lower lobe of lung ([place]) 10/30/2015  . Generalized weakness 10/30/2015  . Metastases to the liver ([place]) 10/20/2015  . Multiple lung nodules 10/20/2015  . Iron deficiency anemia due to chronic blood loss 10/20/2015  . Prostate cancer ([place]) 10/20/2015  . Type 2 diabetes mellitus ([place]) 03/10/2013  . hypercholesterolemia 03/10/2013  . hypertension 03/10/2013  . 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 INGUINAL HERNIA 08/15/2008    Past Surgical History: Past Surgical History:  Procedure Laterality Date  . COLONOSCOPY  2010  . CYSTOSCOPY N/A 07/04/2013   Procedure: CYSTOSCOPY FLEXIBLE;  Surgeon: Claybon Jabs, MD;  Location[facility];  Service: Urology;  Laterality: N/A;  . INGUINAL HERNIA REPAIR Left 2005  . LIVER BIOPSY Right 11/02/2015  . PROSTATE BIOPSY    . RADIOACTIVE SEED IMPLANT N/A 07/04/2013   Procedure: RADIOACTIVE SEED IMPLANT;  Surgeon: Claybon Jabs, MD;  Location: [facility];  Service: Urology;  Laterality: N/A;  . REPAIR CHRONIC INCARCERATED RECURRENT LEFT INGUINAL HERNIA  10-23-2008  . TRANSURETHRAL RESECTION OF BLADDER TUMOR N/A 01/02/2015   Procedure: Cystoscopy clot evalucation  and fulgration;  Surgeon: Kathie Rhodes, MD;  Location: [facility];  Service: Urology;  Laterality: N/A;    Allergies: No Known Allergies  Home Medications: Prescriptions Prior to Admission  Medication Sig Dispense Refill Last Dose  . albuterol (PROVENTIL HFA;VENTOLIN HFA) 108 (90 Base) MCG/ACT inhaler Inhale 1-2 puffs into the lungs every 6 (six) hours as needed.    prn at prn  . aspirin EC 81 MG tablet Take 81 mg by mouth.   12/07/2015 at Unknown  time  . ferrous sulfate 325 (65 FE) MG tablet Take 1 tablet (325 mg total) by mouth 2 (two) times daily with a meal. 60 tablet 0 12/07/2015 at Unknown time  . fexofenadine (ALLEGRA) 180 MG tablet Take 180 mg by mouth daily.   12/07/2015 at Unknown time  . Fluticasone Propionate (FLONASE NA) Place 1 spray into the nose daily.    12/07/2015 at Unknown time  . furosemide (LASIX) 20 MG tablet Take 20 mg by mouth daily.   12/07/2015 at Unknown time  . guaiFENesin (MUCINEX) 600 MG 12 hr tablet Take 1,200 mg by mouth daily.    12/07/2015 at Unknown time  . [EXPIRED] insulin NPH Human (HUMULIN N,NOVOLIN N) 100 UNIT/ML injection Inject into the skin. Sliding scale   12/07/2015 at Unknown time  . losartan (COZAAR) 25 MG tablet Take 25 mg by mouth daily.    12/07/2015 at Unknown time  . metFORMIN (GLUCOPHAGE) 1000 MG tablet Take 1,000 mg by mouth 2 (two) times daily with a meal.    12/07/2015 at Unknown time  . metoprolol succinate (TOPROL-XL) 25 MG 24 hr tablet Take 1 tablet (25 mg total) by mouth daily. 30 tablet 0 12/07/2015 at Unknown time  . montelukast (SINGULAIR) 10 MG tablet Take 1 tablet by mouth at bedtime.    12/07/2015 at Unknown time  . pantoprazole (PROTONIX) 40 MG tablet Take 1 tablet (40 mg total) by mouth daily. 30 tablet 0 12/07/2015 at Unknown time  . tamsulosin (FLOMAX) 0.4 MG CAPS capsule Take 1 capsule (0.4 mg total) by mouth daily after supper. (Patient taking differently: Take 0.8 mg by mouth daily after supper. ) 30 capsule 0 12/07/2015 at Unknown time  . [DISCONTINUED] dexamethasone (DECADRON) 4 MG tablet Take 1 tablet (4 mg total) by mouth 3 (three) times daily. 21 tablet 0 12/07/2015 at Unknown time  . [DISCONTINUED] insulin glargine (LANTUS) 100 UNIT/ML injection Inject 0.2 mLs (20 Units total) into the skin daily. 10 mL 11 Taking  . [DISCONTINUED] oxyCODONE (ROXICODONE) 5 MG immediate release tablet Take 1 tablet (5 mg total) by mouth every 6 (six) hours as needed for severe pain. 20 tablet 0  12/08/2015 at 0400  . potassium chloride (K-DUR) 10 MEQ tablet Take 1 tablet by mouth 2 (two) times daily.   Not Taking at Unknown time  . [DISCONTINUED] atorvastatin (LIPITOR) 40 MG tablet Take 40 mg by mouth daily at 6 PM.    Not Taking at Unknown time   Home medication reconciliation was completed with the patient.   Scheduled Inpatient Medications:   . acetylcysteine  3 mL Oral BID  . amoxicillin-clavulanate  1 tablet Oral Q12H  . budesonide (PULMICORT) nebulizer solution  0.25 mg Nebulization BID  . dexamethasone  4 mg Oral BID  . docusate sodium  100 mg Oral BID  . feeding supplement (GLUCERNA SHAKE)  237 mL Oral TID BM  . fluticasone  1 spray Each Nare Daily  . guaiFENesin  1,200 mg Oral Daily  . insulin  aspart  0-15 Units Subcutaneous TID WC  . insulin aspart  0-5 Units Subcutaneous QHS  . loratadine  10 mg Oral Daily  . losartan  25 mg Oral Daily  . metoprolol succinate  25 mg Oral Daily  . montelukast  10 mg Oral QHS  . pantoprazole  40 mg Oral BID  . senna  1 tablet Oral BID  . sodium chloride flush  3 mL Intravenous Q12H  . tamsulosin  0.8 mg Oral QPC supper    Continuous Inpatient Infusions:    PRN Inpatient Medications:  acetaminophen **OR** acetaminophen, ipratropium-albuterol, ondansetron **OR** ondansetron (ZOFRAN) IV, oxyCODONE, sodium chloride flush  Family History: family history includes Breast cancer in his sister and sister; Cancer in his brother, brother, mother, and sister; Heart disease in his father; Lung cancer in his brother and brother; Prostate cancer in his brother.   Social History:   reports that he quit smoking about 7 years ago. His smoking use included Cigarettes. He has a 30.00 pack-year smoking history. He has never used smokeless tobacco. He reports that he does not drink alcohol or use drugs.   Review of Systems: Constitutional: Weight is stable.  Eyes: No changes in vision. ENT: No oral lesions, sore throat.  GI: see HPI.   Heme/Lymph: No easy bruising.  CV: No chest pain.  GU: No hematuria.  Integumentary: No rashes.  Neuro: No headaches.  Psych: No depression/anxiety.  Endocrine: No heat/cold intolerance.  Allergic/Immunologic: No urticaria.  Resp: No cough, SOB.  Musculoskeletal: No joint swelling.    Physical Examination: BP (!) 123/57 (BP Location: Right Arm)   Pulse 91   Temp 98.3 F (36.8 C) (Oral)   Resp 18   Ht 6' (1.829 m)   Wt 79.7 kg (175 lb 12.8 oz)   SpO2 94%   BMI 23.84 kg/m  Gen: NAD, alert and oriented x 4 HEENT: PEERLA, EOMI, Neck: supple, no JVD or thyromegaly Chest: CTA bilaterally, no wheezes, crackles, or other adventitious sounds CV: RRR, no m/g/c/r Abd: soft, NT, ND, +BS in all four quadrants; no HSM, guarding, ridigity, or rebound tenderness Ext: no edema, well perfused with 2+ pulses, Skin: no rash or lesions noted Lymph: no LAD  Data: Lab Results  Component Value Date   WBC 15.9 (H) 12/16/2015   HGB 10.3 (L) 12/16/2015   HCT 31.4 (L) 12/16/2015   MCV 75.8 (L) 12/16/2015   PLT 173 12/16/2015    Recent Labs Lab 12/15/15 0535 12/15/15 1754 12/16/15 0431  HGB 10.4* 11.4* 10.3*   Lab Results  Component Value Date   NA 143 12/16/2015   K 4.7 12/16/2015   CL 111 12/16/2015   CO2 25 12/16/2015   BUN 37 (H) 12/16/2015   CREATININE 0.85 12/16/2015   Lab Results  Component Value Date   ALT 51 12/08/2015   AST 43 (H) 12/08/2015   ALKPHOS 203 (H) 12/08/2015   BILITOT 0.9 12/08/2015   No results for input(s): APTT, INR, PTT in the last 168 hours.   Assessment/Plan: Mr. Wellen is a 75 y.o. 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  He improved satisfactorily for discharge, but experienced 1 episode of BRB with stooling.  No further BMs or BRBPR.  Hgb stable.  Agree with outpatient assessment that colonoscopy would  likely be low yield for this patient given the risks of the sedated procedure and few  possible benefits.  Diverticular bleed remains on the differential, as well as hemorrhoidal bleeding.  Recommendations: - No indication for colonoscopy at this time due to lack of continued bleeding and stable Hgb.  Procedure unlikely to change patient's plan of care or improve quality of life - Follow-up with GI as outpatient if bleeding continues  Thank you for the consult. We will follow along with you. Please call with questions or concerns.  Lavera Guise, PA-C [facility] Phone: [phone] Pager: [phone]

## 2015-12-16 NOTE — Progress Notes (Signed)
No new episodes of stool.  No blood noted.

## 2015-12-16 NOTE — Progress Notes (Signed)
Patient did not discharge as anticipated due to bleeding and need of GI consult. LCSW has made facility aware of barrier to DC.  Will continue to follow and assist with recommendations along with planned bed at [facility] and palliative care following.  Lane Hacker, MSW Clinical Social Work: [employer] for :  [phone]

## 2015-12-17 ENCOUNTER — Ambulatory Visit
Admit: 2015-12-17 | Discharge: 2015-12-17 | Disposition: A | Discharge status: Home / Self Care | Payer: Medicare Other | Attending: Radiation Oncology | Admitting: Radiation Oncology

## 2015-12-17 LAB — GLUCOSE, CAPILLARY: Glucose-Capillary: 219 mg/dL — ABNORMAL HIGH (ref 65–99)

## 2015-12-17 LAB — HEMOGLOBIN: Hemoglobin: 10 g/dL — ABNORMAL LOW (ref 13.0–18.0)

## 2015-12-17 MED ORDER — HYDROCORTISONE ACETATE 25 MG RE SUPP: 25.0000 mg | Freq: Two times a day (BID) | RECTAL | 0 refills | Status: DC

## 2015-12-17 MED ORDER — AMOXICILLIN-POT CLAVULANATE 875-125 MG PO TABS: 1.0000 | ORAL_TABLET | Freq: Two times a day (BID) | ORAL | 0 refills | Status: DC

## 2015-12-17 MED ORDER — HYDROCORTISONE ACETATE 25 MG RE SUPP
25.0000 mg | Freq: Two times a day (BID) | RECTAL | Status: DC
  Filled 2015-12-17 (×2): qty 1

## 2015-12-17 NOTE — Clinical Social Work Note (Signed)
Patient to be d/c'ed today to [facility] [place].  Patient and family agreeable to plans will transport via ems RN to call report to Rm Makanda [phone].  Evette Cristal, MSW Mon-Fri 8a-4:30p [phone]

## 2015-12-17 NOTE — Care Management Important Message (Signed)
Important Message  Patient Details  Name: [name] MRN: [id number] Date of Birth: [birth year]   Medicare Important Message Given:  Yes    [name], RN 12/17/2015, 8:47 AM

## 2015-12-17 NOTE — Progress Notes (Signed)
Pt being discharged today, report given to [facility] nurse. IV x2 removed, pt belongings packed and given to patient's wife. Pt was placed in a transfer pack, ems transported.

## 2015-12-17 NOTE — Discharge Summary (Signed)
[facility] [place] NAME: [name]    MR#:  [id number]  DATE OF BIRTH:  [birth year]  DATE OF ADMISSION:  12/08/2015 ADMITTING PHYSICIAN: Lytle Butte, MD  DATE OF DISCHARGE: 12/17/2015  PRIMARY CARE PHYSICIAN: SPARKS,JEFFREY D, MD    ADMISSION DIAGNOSIS:  Lactic acidosis [E87.2] Aspiration pneumonia of right lower lobe, unspecified aspiration pneumonia type ([place]) [J69.0]  DISCHARGE DIAGNOSIS:  Active Problems:   Sepsis ([facility])   Palliative care encounter   DNR (do not resuscitate) discussion   Aspiration pneumonia of right lower lobe ([place])   Goals of care, counseling/discussion   SECONDARY DIAGNOSIS:   Past Medical History:  Diagnosis Date  . Arthritis   . Blood transfusion without reported diagnosis    December 28, 2014 8 units   . Cancer ([place])    liver   . GERD (gastroesophageal reflux disease)   . Gout   . History of blood transfusion    Februrary 2017 4units   . Hyperlipidemia   . Hypertension   . Lung cancer ([place])   . 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 Prostate cancer ([place]) 03/2013   had seed implant and radiation for 5 weeks  . Type 2 diabetes mellitus ([place])   . Wears glasses     HOSPITAL COURSE:   1.  Clinical sepsis with aspiration pneumonia and pleural effusion. Patient was placed on aggressive antibiotics with Zosyn and vancomycin initially. I think the patient is chronically aspirating. Patient is on dysphagia 1 diet with nectar thick liquids. Patient was wheezing the entire hospital course and I didn't prescribe nebulizer treatments with budesonide nebulizer and DuoNeb nebulizers. I added Mucomyst. The patient does have some rhonchi in the lungs upon discharge but overall doing better. Finish up Augmentin for 3 more doses. 2. Lower GI bleed. This is likely a diverticular bleed. Discharge was canceled 2 days ago. Hemoglobin remained stable. If the patient a little blood this may be just  residual blood. Patient was seen in consultation by gastroenterology and they did not want to do any procedures. The patient does have hemorrhoids and I prescribed Anusol suppositories. Continue to watch hemoglobin if the patient bleeds. His aspirin and Lovenox injections were stopped. 3. 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 He is on Decadron 4 mg orally every 12 hours. Radiation therapy will happen starting 12/17/2015. Palliative care consultation over at the facility. And sitter comfort care measures if the patient declines. Right now the patient is still full code. 4. Type 2 diabetes mellitus. I decrease Lantus to 10 units daily and 4 units of short acting prior to meals. The patient is not eating very well because he doesn't like the food and he probably doesn't like his diet also. 5. Essential hypertension. Blood pressure on the lower side I will hold his losartan and Toprol. 6. BPH on Flomax 7. Chronic pain. Try Tylenol before you try the oxycodone. Call the wife before he give the oxycodone 8. Nutrition dysphagia 1 diet with nectar thick liquids. Magic cup. DISCHARGE CONDITIONS:   Fair  CONSULTS OBTAINED:  Treatment Team:  Creola Corn, MD Manya Silvas, MD  DRUG ALLERGIES:  No Known Allergies  DISCHARGE MEDICATIONS:   Current Discharge Medication List    START taking these medications   Details  acetaminophen (TYLENOL) 325 MG tablet Take 2 tablets (650 mg total) by mouth every 6 (six) hours as needed for mild pain (or Fever >/=  101). Qty: 30 tablet, Refills: 0    amoxicillin-clavulanate (AUGMENTIN) 875-125 MG tablet Take 1 tablet by mouth every 12 (twelve) hours. Qty: 3 tablet, Refills: 0    budesonide (PULMICORT) 0.25 MG/2ML nebulizer solution Take 2 mLs (0.25 mg total) by nebulization 2 (two) times daily. Qty: 60 mL, Refills: 0    docusate sodium (COLACE) 100 MG capsule Take 1 capsule (100 mg total) by mouth daily as needed for mild  constipation. Qty: 60 capsule, Refills: 0    feeding supplement, GLUCERNA SHAKE, (GLUCERNA SHAKE) LIQD Take 237 mLs by mouth 3 (three) times daily between meals. Qty: 90 Can, Refills: 0    hydrocortisone (ANUSOL-HC) 25 MG suppository Place 1 suppository (25 mg total) rectally 2 (two) times daily. Qty: 12 suppository, Refills: 0    insulin aspart (NOVOLOG) 100 UNIT/ML injection Inject 4 Units into the skin 3 (three) times daily with meals. Qty: 10 mL, Refills: 11    ipratropium-albuterol (DUONEB) 0.5-2.5 (3) MG/3ML SOLN Take 3 mLs by nebulization 4 (four) times daily. Qty: 360 mL, Refills: 0      CONTINUE these medications which have CHANGED   Details  dexamethasone (DECADRON) 4 MG tablet Take 1 tablet (4 mg total) by mouth 2 (two) times daily. Qty: 60 tablet, Refills: 0    insulin glargine (LANTUS) 100 UNIT/ML injection Inject 0.1 mLs (10 Units total) into the skin daily. Qty: 10 mL, Refills: 0    oxyCODONE (ROXICODONE) 5 MG immediate release tablet Please call wife prior to giving Qty: 20 tablet, Refills: 0    tamsulosin (FLOMAX) 0.4 MG CAPS capsule Take 2 capsules (0.8 mg total) by mouth daily after supper. Qty: 60 capsule, Refills: 0      CONTINUE these medications which have NOT CHANGED   Details  ferrous sulfate 325 (65 FE) MG tablet Take 1 tablet (325 mg total) by mouth 2 (two) times daily with a meal. Qty: 60 tablet, Refills: 0    fexofenadine (ALLEGRA) 180 MG tablet Take 180 mg by mouth daily.    Fluticasone Propionate (FLONASE NA) Place 1 spray into the nose daily.     montelukast (SINGULAIR) 10 MG tablet Take 1 tablet by mouth at bedtime.     pantoprazole (PROTONIX) 40 MG tablet Take 1 tablet (40 mg total) by mouth daily. Qty: 30 tablet, Refills: 0      STOP taking these medications     albuterol (PROVENTIL HFA;VENTOLIN HFA) 108 (90 Base) MCG/ACT inhaler      aspirin EC 81 MG tablet      furosemide (LASIX) 20 MG tablet      guaiFENesin (MUCINEX) 600 MG  12 hr tablet      insulin NPH Human (HUMULIN N,NOVOLIN N) 100 UNIT/ML injection      losartan (COZAAR) 25 MG tablet      metFORMIN (GLUCOPHAGE) 1000 MG tablet      metoprolol succinate (TOPROL-XL) 25 MG 24 hr tablet      potassium chloride (K-DUR) 10 MEQ tablet          DISCHARGE INSTRUCTIONS:   Follow up with doctor at rehabilitation one day continue radiation therapy  If you experience worsening of your admission symptoms, develop shortness of breath, life threatening emergency, suicidal or homicidal thoughts you must seek medical attention immediately by calling 911 or calling your MD immediately  if symptoms less severe.  You Must read complete instructions/literature along with all the possible adverse reactions/side effects for all the Medicines you take and that have been prescribed  to you. Take any new Medicines after you have completely understood and accept all the possible adverse reactions/side effects.   Please note  You were cared for by a hospitalist during your hospital stay. If you have any questions about your discharge medications or the care you received while you were in the hospital after you are discharged, you can call the unit and asked to speak with the hospitalist on call if the hospitalist that took care of you is not available. Once you are discharged, your primary care physician will handle any further medical issues. Please note that NO REFILLS for any discharge medications will be authorized once you are discharged, as it is imperative that you return to your primary care physician (or establish a relationship with a primary care physician if you do not have one) for your aftercare needs so that they can reassess your need for medications and monitor your lab values.    Today   CHIEF COMPLAINT:   Chief Complaint  Patient presents with  . Shortness of Breath    HISTORY OF PRESENT ILLNESS:  [name]  is a 75 y.o. male with a known history  of Presented with shortness of breath and found to have pneumonia   VITAL SIGNS:  Blood pressure (!) 105/40, pulse 86, temperature 98.2 F (36.8 C), temperature source Oral, resp. rate 16, height 6' (1.829 m), weight 79.7 kg (175 lb 12.8 oz), SpO2 95 %.  I/O:  No intake or output data in the 24 hours ending 12/17/15 0821  PHYSICAL EXAMINATION:  GENERAL:  75 y.o.-year-old patient lying in the bed with no acute distress.  EYES: Pupils equal, round, reactive to light and accommodation. No scleral icterus. Extraocular muscles intact.  HEENT: Head atraumatic, normocephalic. Oropharynx and nasopharynx clear.  NECK:  Supple, no jugular venous distention. No thyroid enlargement, no tenderness.  LUNGS: Normal breath sounds bilaterally, no wheezing. Scattered rhonchi. No use of accessory muscles of respiration.  CARDIOVASCULAR: S1, S2 normal. No murmurs, rubs, or gallops.  ABDOMEN: Soft, non-tender, non-distended. Bowel sounds present. No organomegaly or mass.  EXTREMITIES: 2+ edema. No cyanosis, or clubbing.  NEUROLOGIC: Cranial nerves II through XII are intact. Muscle strength 5/5 in all extremities. Sensation intact. Gait not checked.  PSYCHIATRIC: The patient is alert and answers yes or no questions.  SKIN: No obvious rash, lesion, or ulcer.   DATA REVIEW:   CBC  Recent Labs Lab 12/16/15 0431 12/17/15 0502  WBC 15.9*  --   HGB 10.3* 10.0*  HCT 31.4*  --   PLT 173  --     Chemistries   Recent Labs Lab 12/16/15 0431  NA 143  K 4.7  CL 111  CO2 25  GLUCOSE 195*  BUN 37*  CREATININE 0.85  CALCIUM 8.5*     Microbiology Results  Results for orders placed or performed during the hospital encounter of 12/08/15  Blood culture (routine x 2)     Status: None   Collection Time: 12/08/15  7:15 AM  Result Value Ref Range Status   Specimen Description BLOOD L AC  Final   Special Requests   Final    BOTTLES DRAWN AEROBIC AND ANAEROBIC AER 10ML ANA 11ML   Culture NO GROWTH 5  DAYS  Final   Report Status 12/13/2015 FINAL  Final  Blood culture (routine x 2)     Status: None   Collection Time: 12/08/15  7:15 AM  Result Value Ref Range Status   Specimen Description BLOOD R  WRIST  Final   Special Requests   Final    BOTTLES DRAWN AEROBIC AND ANAEROBIC AER 10ML ANA 9ML   Culture NO GROWTH 5 DAYS  Final   Report Status 12/13/2015 FINAL  Final  MRSA PCR Screening     Status: Abnormal   Collection Time: 12/08/15  2:14 PM  Result Value Ref Range Status   MRSA by PCR POSITIVE (A) NEGATIVE Final    Comment:        The GeneXpert MRSA Assay (FDA approved for NASAL specimens only), is one component of a comprehensive MRSA colonization surveillance program. It is not intended to diagnose MRSA infection nor to guide or monitor treatment for MRSA infections. RESULT CALLED TO, READ BACK BY AND VERIFIED WITH: Earlie Server @ 6415 12/08/15 by [facility]      Management plans discussed with the patient, family and they are in agreement.  CODE STATUS:     Code Status Orders        Start     Ordered   12/08/15 0941  Full code  Continuous     12/08/15 0940    Code Status History    Date Active Date Inactive Code Status Order ID Comments User Context   12/08/2015  9:14 AM 12/08/2015  9:40 AM DNR [id number]  Lytle Butte, MD ED   11/18/2015  1:21 AM 11/23/2015  7:13 PM DNR [id number]  Saundra Shelling, MD ED   03/17/2015  9:34 PM 03/20/2015  9:43 PM DNR [id number]  Vianne Bulls, MD ED   12/28/2014  8:58 PM 01/04/2015  8:21 PM DNR [id number]  Gennaro Africa, MD Inpatient   12/28/2014  7:59 PM 12/28/2014  8:58 PM Full Code [id number]  Gennaro Africa, MD Inpatient    Advance Directive Documentation   Flowsheet Row Most Recent Value  Type of Advance Directive  Healthcare Power of Attorney, Living will  Pre-existing out of facility DNR order (yellow form or pink MOST form)  No data  "MOST" Form in Place?  No data      TOTAL TIME TAKING CARE OF THIS PATIENT: 35 minutes.     Loletha Grayer M.D on 12/17/2015 at 8:21 AM  Between 7am to 6pm - Pager - [phone]  After 6pm go to [email or web address] - password [email or web address]  [facility] [employer] Office  [phone]  CC: Primary care physician; Idelle Crouch, MD

## 2015-12-18 ENCOUNTER — Ambulatory Visit: Payer: Medicare Other

## 2015-12-18 ENCOUNTER — Telehealth: Payer: Self-pay | Admitting: *Deleted

## 2015-12-18 NOTE — Telephone Encounter (Signed)
Nicole at [facility] given appointment dates and times for radiation.  Per Elmyra Ricks transportation will be set up.

## 2015-12-21 ENCOUNTER — Ambulatory Visit: Payer: Medicare Other

## 2015-12-21 ENCOUNTER — Emergency Department: Payer: Medicare Other

## 2015-12-21 ENCOUNTER — Encounter: Payer: Self-pay | Admitting: Intensive Care

## 2015-12-21 ENCOUNTER — Inpatient Hospital Stay
Admission: EM | Admit: 2015-12-21 | Discharge: 2015-12-21 | DRG: 871 | Disposition: A | Discharge status: Hospice | Payer: Medicare Other | Attending: Internal Medicine | Admitting: Internal Medicine

## 2015-12-21 DIAGNOSIS — Z79899 Other long term (current) drug therapy: Secondary | ICD-10-CM | POA: Diagnosis not present

## 2015-12-21 DIAGNOSIS — R0902 Hypoxemia: Secondary | ICD-10-CM | POA: Diagnosis present

## 2015-12-21 DIAGNOSIS — Z801 Family history of malignant neoplasm of trachea, bronchus and lung: Secondary | ICD-10-CM

## 2015-12-21 DIAGNOSIS — Z66 Do not resuscitate: Secondary | ICD-10-CM | POA: Diagnosis present

## 2015-12-21 DIAGNOSIS — A419 Sepsis, unspecified organism: Principal | ICD-10-CM | POA: Diagnosis present

## 2015-12-21 DIAGNOSIS — Z515 Encounter for palliative care: Secondary | ICD-10-CM | POA: Diagnosis present

## 2015-12-21 DIAGNOSIS — J690 Pneumonitis due to inhalation of food and vomit: Secondary | ICD-10-CM | POA: Diagnosis present

## 2015-12-21 DIAGNOSIS — K219 Gastro-esophageal reflux disease without esophagitis: Secondary | ICD-10-CM | POA: Diagnosis present

## 2015-12-21 DIAGNOSIS — Z8546 Personal history of malignant neoplasm of prostate: Secondary | ICD-10-CM

## 2015-12-21 DIAGNOSIS — Z87891 Personal history of nicotine dependence: Secondary | ICD-10-CM

## 2015-12-21 DIAGNOSIS — Z794 Long term (current) use of insulin: Secondary | ICD-10-CM | POA: Diagnosis not present

## 2015-12-21 DIAGNOSIS — M109 Gout, unspecified: Secondary | ICD-10-CM | POA: Diagnosis present

## 2015-12-21 DIAGNOSIS — Z8042 Family history of malignant neoplasm of prostate: Secondary | ICD-10-CM

## 2015-12-21 DIAGNOSIS — E875 Hyperkalemia: Secondary | ICD-10-CM | POA: Diagnosis present

## 2015-12-21 DIAGNOSIS — E119 Type 2 diabetes mellitus without complications: Secondary | ICD-10-CM | POA: Diagnosis present

## 2015-12-21 DIAGNOSIS — N179 Acute kidney failure, unspecified: Secondary | ICD-10-CM | POA: Diagnosis present

## 2015-12-21 DIAGNOSIS — E870 Hyperosmolality and hypernatremia: Secondary | ICD-10-CM | POA: Diagnosis present

## 2015-12-21 DIAGNOSIS — Z7952 Long term (current) use of systemic steroids: Secondary | ICD-10-CM

## 2015-12-21 DIAGNOSIS — G9340 Encephalopathy, unspecified: Secondary | ICD-10-CM | POA: Diagnosis present

## 2015-12-21 DIAGNOSIS — E785 Hyperlipidemia, unspecified: Secondary | ICD-10-CM | POA: Diagnosis present

## 2015-12-21 DIAGNOSIS — Z8249 Family history of ischemic heart disease and other diseases of the circulatory system: Secondary | ICD-10-CM

## 2015-12-21 DIAGNOSIS — E860 Dehydration: Secondary | ICD-10-CM | POA: Diagnosis present

## 2015-12-21 DIAGNOSIS — C787 Secondary malignant neoplasm of liver and intrahepatic bile duct: Secondary | ICD-10-CM | POA: Diagnosis present

## 2015-12-21 DIAGNOSIS — I10 Essential (primary) hypertension: Secondary | ICD-10-CM | POA: Diagnosis present

## 2015-12-21 DIAGNOSIS — C7931 Secondary malignant neoplasm of brain: Secondary | ICD-10-CM | POA: Diagnosis present

## 2015-12-21 DIAGNOSIS — Z923 Personal history of irradiation: Secondary | ICD-10-CM

## 2015-12-21 DIAGNOSIS — C3490 Malignant neoplasm of unspecified part of unspecified bronchus or lung: Secondary | ICD-10-CM | POA: Diagnosis present

## 2015-12-21 LAB — COMPREHENSIVE METABOLIC PANEL
ALK PHOS: 537 U/L — AB (ref 38–126)
ALT: 139 U/L — AB (ref 17–63)
ANION GAP: 18 — AB (ref 5–15)
AST: 185 U/L — ABNORMAL HIGH (ref 15–41)
Albumin: 2.3 g/dL — ABNORMAL LOW (ref 3.5–5.0)
BILIRUBIN TOTAL: 3.3 mg/dL — AB (ref 0.3–1.2)
BUN: 75 mg/dL — ABNORMAL HIGH (ref 6–20)
CALCIUM: 9.1 mg/dL (ref 8.9–10.3)
CO2: 20 mmol/L — ABNORMAL LOW (ref 22–32)
CREATININE: 1.73 mg/dL — AB (ref 0.61–1.24)
Chloride: 116 mmol/L — ABNORMAL HIGH (ref 101–111)
GFR, EST AFRICAN AMERICAN: 42 mL/min — AB (ref >60)
GFR, EST NON AFRICAN AMERICAN: 37 mL/min — AB (ref >60)
Glucose, Bld: 366 mg/dL — ABNORMAL HIGH (ref 65–99)
Potassium: 5.3 mmol/L — ABNORMAL HIGH (ref 3.5–5.1)
Sodium: 154 mmol/L — ABNORMAL HIGH (ref 135–145)
TOTAL PROTEIN: 6.9 g/dL (ref 6.5–8.1)

## 2015-12-21 LAB — CBC WITH DIFFERENTIAL/PLATELET
BASOS ABS: 0.2 10*3/uL — AB (ref 0–0.1)
BASOS PCT: 1 %
EOS ABS: 0 10*3/uL (ref 0–0.7)
Eosinophils Relative: 0 %
HEMATOCRIT: 39 % — AB (ref 40.0–52.0)
HEMOGLOBIN: 12.2 g/dL — AB (ref 13.0–18.0)
Lymphocytes Relative: 2 %
Lymphs Abs: 0.7 10*3/uL — ABNORMAL LOW (ref 1.0–3.6)
MCH: 24.9 pg — ABNORMAL LOW (ref 26.0–34.0)
MCHC: 31.3 g/dL — AB (ref 32.0–36.0)
MCV: 79.5 fL — ABNORMAL LOW (ref 80.0–100.0)
MONO ABS: 1.1 10*3/uL — AB (ref 0.2–1.0)
Monocytes Relative: 3 %
NEUTROS ABS: 30 10*3/uL — AB (ref 1.4–6.5)
Neutrophils Relative %: 94 %
Platelets: 258 10*3/uL (ref 150–440)
RBC: 4.91 MIL/uL (ref 4.40–5.90)
RDW: 21.8 % — AB (ref 11.5–14.5)
WBC: 32 10*3/uL — ABNORMAL HIGH (ref 3.8–10.6)

## 2015-12-21 LAB — URINALYSIS COMPLETE WITH MICROSCOPIC (ARMC ONLY)
Bilirubin Urine: NEGATIVE
Glucose, UA: >500 mg/dL — AB
Nitrite: NEGATIVE
PH: 5 (ref 5.0–8.0)
PROTEIN: NEGATIVE mg/dL
Specific Gravity, Urine: 1.022 (ref 1.005–1.030)

## 2015-12-21 LAB — BLOOD GAS, ARTERIAL
ACID-BASE DEFICIT: 3.3 mmol/L — AB (ref 0.0–2.0)
BICARBONATE: 21.3 mmol/L (ref 20.0–28.0)
FIO2: 1
O2 SAT: 96.1 %
PCO2 ART: 36 mmHg (ref 32.0–48.0)
PH ART: 7.38 (ref 7.350–7.450)
Patient temperature: 37
pO2, Arterial: 84 mmHg (ref 83.0–108.0)

## 2015-12-21 LAB — PROCALCITONIN: Procalcitonin: 1.15 ng/mL

## 2015-12-21 LAB — GLUCOSE, CAPILLARY
GLUCOSE-CAPILLARY: 219 mg/dL — AB (ref 65–99)
Glucose-Capillary: 287 mg/dL — ABNORMAL HIGH (ref 65–99)
Glucose-Capillary: 206 mg/dL — ABNORMAL HIGH (ref 65–99)

## 2015-12-21 LAB — TROPONIN I: TROPONIN I: 0.1 ng/mL — AB (ref <0.03)

## 2015-12-21 LAB — LACTIC ACID, PLASMA: Lactic Acid, Venous: 5.6 mmol/L (ref 0.5–1.9)

## 2015-12-21 LAB — LIPASE, BLOOD: LIPASE: 17 U/L (ref 11–51)

## 2015-12-21 LAB — BRAIN NATRIURETIC PEPTIDE: B NATRIURETIC PEPTIDE 5: 255 pg/mL — AB (ref 0.0–100.0)

## 2015-12-21 MED ORDER — SODIUM CHLORIDE 0.9 % IV SOLN
1000.0000 mL | Freq: Once | INTRAVENOUS | Status: AC
  Administered 2015-12-21: 1000 mL via INTRAVENOUS

## 2015-12-21 MED ORDER — LORAZEPAM 2 MG/ML IJ SOLN: 1.0000 mg | INTRAMUSCULAR | Status: DC | PRN

## 2015-12-21 MED ORDER — SODIUM CHLORIDE 0.9 % IV BOLUS (SEPSIS)
1500.0000 mL | Freq: Once | INTRAVENOUS | Status: AC
  Administered 2015-12-21: 1500 mL via INTRAVENOUS

## 2015-12-21 MED ORDER — MORPHINE SULFATE (CONCENTRATE) 10 MG/0.5ML PO SOLN: 5.0000 mg | ORAL | Status: DC | PRN

## 2015-12-21 MED ORDER — VANCOMYCIN HCL IN DEXTROSE 1-5 GM/200ML-% IV SOLN
1000.0000 mg | Freq: Once | INTRAVENOUS | Status: AC
  Administered 2015-12-21: 1000 mg via INTRAVENOUS
  Filled 2015-12-21: qty 200

## 2015-12-21 MED ORDER — CEFEPIME-DEXTROSE 2 GM/50ML IV SOLR
2.0000 g | Freq: Once | INTRAVENOUS | Status: AC
  Administered 2015-12-21: 2 g via INTRAVENOUS
  Filled 2015-12-21: qty 50

## 2015-12-21 MED ORDER — MORPHINE SULFATE (CONCENTRATE) 10 MG/0.5ML PO SOLN: 10.0000 mg | ORAL | Status: DC | PRN

## 2015-12-21 MED ORDER — LORAZEPAM 2 MG/ML PO CONC: 1.0000 mg | Freq: Four times a day (QID) | ORAL | 0 refills | Status: AC | PRN

## 2015-12-21 MED ORDER — MORPHINE SULFATE (CONCENTRATE) 10 MG/0.5ML PO SOLN: 5.0000 mg | ORAL | 0 refills | Status: AC | PRN

## 2015-12-21 NOTE — Progress Notes (Signed)
Family Meeting Note  Advance Directive: No  Today a meeting took place with the Patient and spouse.  Patient is unable to participate due YG:EFUWTK capacity confused, encephalopathic   The following clinical team members were present during this [place] was updated  The following were discussed:Patient's diagnosis: , Patient's progosis: < 6 weeks and Goals for treatment: DNR and comfort care  Additional follow-up to be provided: patient being discharged to hospice home.  Time spent during discussion:30 minutes  Gladstone Lighter, MD

## 2015-12-21 NOTE — Discharge Summary (Signed)
[facility] [place] NAME: [name]    MR#:  [id number]  DATE OF BIRTH:  05-19-1940  DATE OF ADMISSION:  12/21/2015   ADMITTING PHYSICIAN: Gladstone Lighter, MD  DATE OF DISCHARGE:  12/21/15  PRIMARY CARE PHYSICIAN: SPARKS,JEFFREY D, MD   ADMISSION DIAGNOSIS:   Sepsis, due to unspecified organism ([place]) [A41.9]  DISCHARGE DIAGNOSIS:   Active Problems:   Sepsis ([place])   SECONDARY DIAGNOSIS:   Past Medical History:  Diagnosis Date  . Arthritis   . Blood transfusion without reported diagnosis    December 28, 2014 8 units   . Cancer ([place])    liver   . GERD (gastroesophageal reflux disease)   . Gout   . History of blood transfusion    Februrary 2017 4units   . Hyperlipidemia   . Hypertension   . Lung cancer ([place])   . 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 Prostate cancer ([place]) 03/2013   had seed implant and radiation for 5 weeks  . Type 2 diabetes mellitus ([place])   . Wears glasses     HOSPITAL COURSE:   [name]  is a 75 y.o. 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  Patient has the following diagnosis at this time:  1. sepsis-aspiration pneumonia 2. Hypernatremia and hyperkalemia 3. Acute renal failure 4. Worsening of his LFTs secondary to worsening liver metastatic sepsis 5. Metastatic stage IV neuroendocrine tumor of the lung 6. Brain metastases 7. Hypertension 8. Hyperlipidemia 9. History of prostrate cancer  Extensive discussion with wife for the phone, wife made the patient DO NOT RESUSCITATE. Patient admitted for comfort measures and hospice evaluation. Family is interested in hospice home. Appreciate hospice consult- patient will be  discharged to hospice home today  DISCHARGE CONDITIONS:   Critical  CONSULTS OBTAINED:   None  DRUG ALLERGIES:   No Known Allergies DISCHARGE MEDICATIONS:     Medication List    STOP taking these medications   acetaminophen 325 MG tablet Commonly known as:  TYLENOL   amoxicillin-clavulanate 875-125 MG tablet Commonly known as:  AUGMENTIN   dexamethasone 4 MG tablet Commonly known as:  DECADRON   docusate sodium 100 MG capsule Commonly known as:  COLACE   feeding supplement (GLUCERNA SHAKE) Liqd   ferrous sulfate 325 (65 FE) MG tablet   hydrocortisone 25 MG suppository Commonly known as:  ANUSOL-HC   insulin glargine 100 UNIT/ML injection Commonly known as:  LANTUS   tamsulosin 0.4 MG Caps capsule Commonly known as:  FLOMAX     TAKE these medications   LORazepam 2 MG/ML concentrated solution Commonly known as:  ATIVAN Take 0.5 mLs (1 mg total) by mouth every 6 (six) hours as needed for anxiety.   morphine CONCENTRATE 10 MG/0.5ML Soln concentrated solution Take 0.25-0.5 mLs (5-10 mg total) by mouth every hour as needed for moderate pain, severe pain or shortness of breath.        DISCHARGE INSTRUCTIONS:   1. Patient being discharged to hospice home  OXYGEN:   Home Oxygen: Yes.    Oxygen Delivery: 2 liters/min via Patient connected to nasal cannula oxygen  DISCHARGE LOCATION:   Hospice home   If you experience worsening of your  admission symptoms, develop shortness of breath, life threatening emergency, suicidal or homicidal thoughts you must seek medical attention immediately by calling 911 or calling your MD immediately  if symptoms less severe.  You Must read complete instructions/literature along with all the possible adverse reactions/side effects for all the Medicines you take and that have been prescribed to you. Take any new Medicines after you have completely understood and accpet all the possible adverse reactions/side effects.   Please  note  You were cared for by a hospitalist during your hospital stay. If you have any questions about your discharge medications or the care you received while you were in the hospital after you are discharged, you can call the unit and asked to speak with the hospitalist on call if the hospitalist that took care of you is not available. Once you are discharged, your primary care physician will handle any further medical issues. Please note that NO REFILLS for any discharge medications will be authorized once you are discharged, as it is imperative that you return to your primary care physician (or establish a relationship with a primary care physician if you do not have one) for your aftercare needs so that they can reassess your need for medications and monitor your lab values.    On the day of Discharge:  VITAL SIGNS:   Blood pressure 94/83, pulse (!) 109, temperature (!) 101 F (38.3 C), temperature source Rectal, resp. rate (!) 24, height 6' (1.829 m), weight 79.4 kg (175 lb), SpO2 93 %.  PHYSICAL EXAMINATION:   GENERAL:  75 y.o.-year-old patient lying in the bed with no acute distress. Critically ill appearing. EYES: Pupils equal, round, reactive to light and accommodation. positive scleral icterus. Extraocular muscles intact.  HEENT: Head atraumatic, normocephalic. Oropharynx and nasopharynx clear.  NECK:  Supple, no jugular venous distention. No thyroid enlargement, no tenderness.  LUNGS: Normal breath sounds bilaterally, no wheezing, rales,rhonchi or crepitation. No use of accessory muscles of respiration. Coarse breath sounds at the bases CARDIOVASCULAR: S1, S2 normal. No murmurs, rubs, or gallops.  ABDOMEN: Soft, nontender, nondistended. Hypoactive Bowel sounds present. No organomegaly or mass.  EXTREMITIES: No pedal edema, cyanosis, or clubbing.  NEUROLOGIC: very confused, not following commands, nodding head to some questions PSYCHIATRIC: The patient is alert , very confused.    SKIN: No obvious rash, lesion, or ulcer.    DATA REVIEW:   CBC  Recent Labs Lab 12/21/15 0931  WBC 32.0*  HGB 12.2*  HCT 39.0*  PLT 258    Chemistries   Recent Labs Lab 12/21/15 0931  NA 154*  K 5.3*  CL 116*  CO2 20*  GLUCOSE 366*  BUN 75*  CREATININE 1.73*  CALCIUM 9.1  AST 185*  ALT 139*  ALKPHOS 537*  BILITOT 3.3*     Microbiology Results  Results for orders placed or performed during the hospital encounter of 12/08/15  Blood culture (routine x 2)     Status: None   Collection Time: 12/08/15  7:15 AM  Result Value Ref Range Status   Specimen Description BLOOD L AC  Final   Special Requests   Final    BOTTLES DRAWN AEROBIC AND ANAEROBIC AER 10ML ANA 11ML   Culture NO GROWTH 5 DAYS  Final   Report Status 12/13/2015 FINAL  Final  Blood culture (routine x 2)     Status: None   Collection Time: 12/08/15  7:15 AM  Result Value Ref Range Status   Specimen Description BLOOD R WRIST  Final   Special Requests   Final    BOTTLES DRAWN AEROBIC AND ANAEROBIC AER 10ML ANA 9ML   Culture NO GROWTH 5 DAYS  Final   Report Status 12/13/2015 FINAL  Final  MRSA PCR Screening     Status: Abnormal   Collection Time: 12/08/15  2:14 PM  Result Value Ref Range Status   MRSA by PCR POSITIVE (A) NEGATIVE Final    Comment:        The GeneXpert MRSA Assay (FDA approved for NASAL specimens only), is one component of a comprehensive MRSA colonization surveillance program. It is not intended to diagnose MRSA infection nor to guide or monitor treatment for MRSA infections. RESULT CALLED TO, READ BACK BY AND VERIFIED WITH: Earlie Server @ 1275 12/08/15 by [place]:  Dg Chest Port 1 View  Result Date: 12/21/2015 CLINICAL DATA:  Shortness of breath EXAM: PORTABLE CHEST 1 VIEW COMPARISON:  One week ago FINDINGS: Unchanged elevation of the right diaphragm with basilar atelectasis. There is a small right pleural effusion. Lucency at the right base is likely  aerated lung. No definite pneumothorax. Low volume but otherwise clear right lung. No cardiomegaly when accounting for leftward shift. IMPRESSION: 1. Stable from prior. 2. Hepatic and right thoracic metastatic disease with elevated right diaphragm. Right basilar atelectasis and small effusion. Electronically Signed   By: Monte Fantasia M.D.   On: 12/21/2015 09:49     Management plans discussed with the patient, family and they are in agreement.  CODE STATUS:     Code Status Orders        Start     Ordered   12/21/15 1256  Do not attempt resuscitation (DNR)  Continuous    Question Answer Comment  In the event of cardiac or respiratory ARREST Do not call a "code blue"   In the event of cardiac or respiratory ARREST Do not perform Intubation, CPR, defibrillation or ACLS   In the event of cardiac or respiratory ARREST Use medication by any route, position, wound care, and other measures to relive pain and suffering. May use oxygen, suction and manual treatment of airway obstruction as needed for comfort.      12/21/15 1255    Code Status History    Date Active Date Inactive Code Status Order ID Comments User Context   12/08/2015  9:40 AM 12/17/2015  6:43 PM Full Code [id number]  Lytle Butte, MD ED   12/08/2015  9:14 AM 12/08/2015  9:40 AM DNR [id number]  Lytle Butte, MD ED   11/18/2015  1:21 AM 11/23/2015  7:13 PM DNR [id number]  Saundra Shelling, MD ED   03/17/2015  9:34 PM 03/20/2015  9:43 PM DNR [id number]  Vianne Bulls, MD ED   12/28/2014  8:58 PM 01/04/2015  8:21 PM DNR [id number]  Gennaro Africa, MD Inpatient   12/28/2014  7:59 PM 12/28/2014  8:58 PM Full Code [id number]  Gennaro Africa, MD Inpatient    Advance Directive Documentation   Flowsheet Row Most Recent Value  Type of Advance Directive  Healthcare Power of Attorney, Living will  Pre-existing out of facility DNR order (yellow form or pink MOST form)  No data  "MOST" Form in Place?  No data      TOTAL TIME TAKING CARE OF THIS  PATIENT: 38 minutes.    Shajuan Musso M.D on 12/21/2015 at 3:50 PM  Between 7am to 6pm - Pager - [phone]  After 6pm go to [email or web address] - password  [email or web address]  [employer] [facility]  Office  [phone]  CC: Primary care physician; Idelle Crouch, MD   Note: This dictation was prepared with Dragon dictation along with smaller phrase technology. Any transcriptional errors that result from this process are unintentional.

## 2015-12-21 NOTE — Progress Notes (Signed)
Patient discharged to [facility] via EMS. Madlyn Frankel, RN

## 2015-12-21 NOTE — ED Triage Notes (Signed)
Patient arrived by EMS from [facility] in [place]. Staff called EMS due to O2 sats in the 70s. Pt arrived on non rebreather with O2 sats of 93. Patient has cancer metastasized to liver, brain and lungs.  Staff reports patient has not eaten X3 days. Pt was recently seen for pneumonia and took last dose of antibiotic yesterday. EMS vitals B/p in 150s, HR 120s and blood sugar 317. Pt moaning and groaning upon arrival. Able to shake head and respond to voice

## 2015-12-21 NOTE — Progress Notes (Signed)
New hospice home referral received from Attending physician Dr. Tressia Miners. 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 He was noted to have poor oral intake over the weekend and decreased oxygen saturations. In the Ed he was found to have  worsening kidney and liver function aw well as elevated WBC's, code sepsis was called. After a discussion with Dr. Tressia Miners, Mrs. Huffine chose to focus on her husband's comfort and has requested transfer to the hospice home. Writer met in the family room with Mrs. Barcellos and her daughter Sheppard Evens to initiate education regarding hospice services, philosophy and team approach to care with good understanding voiced. Questions answered, consents signed. Report called to the hospice home, EMS notified for transport. Hospital care team all aware of and in agreement with discharge via EMS to the hospice home. Signed DNR in place in discharge packet. Flo Shanks RN, BSN, [facility] [place], hospital Liaison [phone] c

## 2015-12-21 NOTE — H&P (Signed)
[facility] [place] NAME: [name]    MR#:  [id number]  DATE OF BIRTH:  [birth year]  DATE OF ADMISSION:  12/21/2015  PRIMARY CARE PHYSICIAN: Idelle Crouch, MD   REQUESTING/REFERRING PHYSICIAN: Dr. Lavonia Drafts  CHIEF COMPLAINT:   Chief Complaint  Patient presents with  . Shortness of Breath    HISTORY OF PRESENT ILLNESS:  [name]  is a 75 y.o. 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 Patient is very confused at this time and unable to give any history. History is obtained from ER physician and also talking to his wife over the phone. Also spoke with patient's oncologist. Patient received 1 cycle of chemotherapy, however was admitted to hospital with mental status changes and noticed to have brain metastatic sepsis E date started on brain irradiation, discharged to rehabilitation. Has been clinically declining. Decreased appetite and no oral intake for the last 3 days. Very dehydrated. Noted to have fevers last night. He was discharged on Augmentin from the hospital 3 days ago. Just finished his antibiotic yesterday. His oxygen was noted to be in the 70s and so brought to the hospital. His oncologist recommended hospice involvement as he has poor prognosis. Extensive discussion with the wife, about the prognosis and current changes in his labs including hypernatremia, renal failure, worsening of his liver function and his underlying sepsis. Wife finally made him DO NOT RESUSCITATE. Line agreed for comfort measures and hospice involvement. She is interested in hospice home of [place].  PAST MEDICAL HISTORY:   Past Medical History:  Diagnosis Date  . Arthritis   . Blood  transfusion without reported diagnosis    December 28, 2014 8 units   . Cancer ([place])    liver   . GERD (gastroesophageal reflux disease)   . Gout   . History of blood transfusion    Februrary 2017 4units   . Hyperlipidemia   . Hypertension   . Lung cancer ([place])   . 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 Prostate cancer ([place]) 03/2013   had seed implant and radiation for 5 weeks  . Type 2 diabetes mellitus ([place])   . Wears glasses     PAST SURGICAL HISTORY:   Past Surgical History:  Procedure Laterality Date  . COLONOSCOPY  2010  . CYSTOSCOPY N/A 07/04/2013   Procedure: CYSTOSCOPY FLEXIBLE;  Surgeon: Claybon Jabs, MD;  Location: [facility];  Service: Urology;  Laterality: N/A;  . INGUINAL HERNIA REPAIR Left 2005  . LIVER BIOPSY Right 11/02/2015  . PROSTATE BIOPSY    . RADIOACTIVE SEED IMPLANT N/A 07/04/2013   Procedure: RADIOACTIVE SEED IMPLANT;  Surgeon: Claybon Jabs, MD;  Location: [facility];  Service: Urology;  Laterality: N/A;  . REPAIR CHRONIC INCARCERATED RECURRENT LEFT INGUINAL HERNIA  10-23-2008  . TRANSURETHRAL RESECTION OF BLADDER TUMOR N/A 01/02/2015   Procedure: Cystoscopy clot evalucation and fulgration;  Surgeon: Kathie Rhodes, MD;  Location: [facility];  Service: Urology;  Laterality: N/A;    SOCIAL HISTORY:   Social History  Substance Use Topics  . Smoking status: Former Smoker    Packs/day: 1.00    Years: 30.00    Types: Cigarettes  Quit date: 02/01/2008  . Smokeless tobacco: Never Used  . Alcohol use No    FAMILY HISTORY:   Family History  Problem Relation Age of Onset  . Cancer Mother     ovarian  . Heart disease Father   . Cancer Sister     breast  . Breast cancer Sister   . Cancer Brother     lung  . Cancer Brother     prostate  . Prostate cancer Brother   . Breast cancer Sister   . Lung cancer Brother   . Lung cancer Brother     DRUG ALLERGIES:  No Known Allergies  REVIEW OF SYSTEMS:     Review of Systems  Unable to perform ROS: Mental status change    MEDICATIONS AT HOME:   Prior to Admission medications   Medication Sig Start Date End Date Taking? Authorizing Provider  acetaminophen (TYLENOL) 325 MG tablet Take 2 tablets (650 mg total) by mouth every 6 (six) hours as needed for mild pain (or Fever >/= 101). 12/15/15  Yes Loletha Grayer, MD  amoxicillin-clavulanate (AUGMENTIN) 875-125 MG tablet Take 1 tablet by mouth every 12 (twelve) hours. 12/17/15  Yes Richard Leslye Peer, MD  dexamethasone (DECADRON) 4 MG tablet Take 1 tablet (4 mg total) by mouth 2 (two) times daily. 12/15/15  Yes Loletha Grayer, MD  ferrous sulfate 325 (65 FE) MG tablet Take 1 tablet (325 mg total) by mouth 2 (two) times daily with a meal. 03/20/15  Yes Lavina Hamman, MD  hydrocortisone (ANUSOL-HC) 25 MG suppository Place 1 suppository (25 mg total) rectally 2 (two) times daily. 12/17/15  Yes Richard Leslye Peer, MD  insulin glargine (LANTUS) 100 UNIT/ML injection Inject 0.1 mLs (10 Units total) into the skin daily. 12/15/15  Yes Loletha Grayer, MD  tamsulosin (FLOMAX) 0.4 MG CAPS capsule Take 2 capsules (0.8 mg total) by mouth daily after supper. 12/15/15  Yes Loletha Grayer, MD  docusate sodium (COLACE) 100 MG capsule Take 1 capsule (100 mg total) by mouth daily as needed for mild constipation. 12/15/15   Loletha Grayer, MD  feeding supplement, GLUCERNA SHAKE, (GLUCERNA SHAKE) LIQD Take 237 mLs by mouth 3 (three) times daily between meals. 12/15/15   Loletha Grayer, MD      VITAL SIGNS:  Blood pressure 129/75, pulse (!) 110, temperature (!) 101 F (38.3 C), temperature source Rectal, resp. rate (!) 22, height 6' (1.829 m), weight 79.4 kg (175 lb), SpO2 98 %.  PHYSICAL EXAMINATION:   Physical Exam  GENERAL:  75 y.o.-year-old patient lying in the bed with no acute distress. Critically ill appearing. EYES: Pupils equal, round, reactive to light and accommodation. positive scleral icterus.  Extraocular muscles intact.  HEENT: Head atraumatic, normocephalic. Oropharynx and nasopharynx clear.  NECK:  Supple, no jugular venous distention. No thyroid enlargement, no tenderness.  LUNGS: Normal breath sounds bilaterally, no wheezing, rales,rhonchi or crepitation. No use of accessory muscles of respiration. Coarse breath sounds at the bases CARDIOVASCULAR: S1, S2 normal. No murmurs, rubs, or gallops.  ABDOMEN: Soft, nontender, nondistended. Hypoactive Bowel sounds present. No organomegaly or mass.  EXTREMITIES: No pedal edema, cyanosis, or clubbing.  NEUROLOGIC: very confused, not following commands, nodding head to some questions PSYCHIATRIC: The patient is alert , very confused.  SKIN: No obvious rash, lesion, or ulcer.   LABORATORY PANEL:   CBC  Recent Labs Lab 12/21/15 0931  WBC 32.0*  HGB 12.2*  HCT 39.0*  PLT 258   ------------------------------------------------------------------------------------------------------------------  Chemistries   Recent Labs Lab  12/21/15 0931  NA 154*  K 5.3*  CL 116*  CO2 20*  GLUCOSE 366*  BUN 75*  CREATININE 1.73*  CALCIUM 9.1  AST 185*  ALT 139*  ALKPHOS 537*  BILITOT 3.3*   ------------------------------------------------------------------------------------------------------------------  Cardiac Enzymes  Recent Labs Lab 12/21/15 0931  TROPONINI 0.10*   ------------------------------------------------------------------------------------------------------------------  RADIOLOGY:  Dg Chest Port 1 View  Result Date: 12/21/2015 CLINICAL DATA:  Shortness of breath EXAM: PORTABLE CHEST 1 VIEW COMPARISON:  One week ago FINDINGS: Unchanged elevation of the right diaphragm with basilar atelectasis. There is a small right pleural effusion. Lucency at the right base is likely aerated lung. No definite pneumothorax. Low volume but otherwise clear right lung. No cardiomegaly when accounting for leftward shift. IMPRESSION: 1.  Stable from prior. 2. Hepatic and right thoracic metastatic disease with elevated right diaphragm. Right basilar atelectasis and small effusion. Electronically Signed   By: Monte Fantasia M.D.   On: 12/21/2015 09:49    EKG:   Orders placed or performed during the hospital encounter of 12/21/15  . EKG 12-Lead  . EKG 12-Lead    IMPRESSION AND PLAN:   [name]  is a 75 y.o. 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  Patient has the following diagnosis at this time:  #1 sepsis-aspiration pneumonia 2. Hypernatremia and hyperkalemia 3. Acute renal failure 4. Worsening of his LFTs secondary to worsening liver metastatic sepsis 5. Metastatic stage IV neuroendocrine tumor of the lung 6. Brain metastases 7. I pretension 8. Hyperlipidemia 9. History of prostrate cancer  Extensive discussion with wife for the phone, wife made the patient DO NOT RESUSCITATE. We will admit the patient for comfort measures and hospice evaluation. Family is interested in hospice home. -Care manager from oncology floor has been updated.    All the records are reviewed and case discussed with ED provider. Management plans discussed with the patient, family and they are in agreement.  CODE STATUS: DNR- discussed with wife over the phone  TOTAL TIME TAKING CARE OF THIS PATIENT: 50 minutes.    Gladstone Lighter M.D on 12/21/2015 at 11:49 AM  Between 7am to 6pm - Pager - [phone]  After 6pm go to [email or web address] - password [email or web address]  Office  [phone]  CC: Primary care physician; Idelle Crouch, MD

## 2015-12-21 NOTE — Clinical Social Work Note (Signed)
Clinical Social Work Assessment  Patient Details  Name: [name] MRN: [id number] Date of Birth: 1941-01-05  Date of referral:  12/21/15               Reason for consult:  End of Life/Hospice                Permission sought to share information with:  Facility Sport and exercise psychologist Permission granted to share information::  Yes, Verbal Permission Granted  Name::     Kayd, [place] [phone]  [phone]   Agency::  [facility]  Relationship::     Contact Information:     Housing/Transportation Living arrangements for the past 2 months:  [place] of Information:  Adult Children, Facility Patient Interpreter Needed:  None Criminal Activity/Legal Involvement Pertinent to Current Situation/Hospitalization:    Significant Relationships:  Spouse Lives with:  Facility Resident Do you feel safe going back to the place where you live?  No Need for family participation in patient care:  Yes (Comment)  Care giving concerns:  Patient's wife feels he is at end of life care now.   Social Worker assessment / plan:  Patient is a 75 year old male who is alert and oriented x1.  Patient was at a SNF, but has now declined patient is now going to [facility] [place].  Patient was just recently discharged from the hospital and was readmitted.  Patient is not talking very much to MSW, MSW spoke to patient's wife to complete assessment.  Employment status:  Retired Forensic scientist:  Medicare PT Recommendations:  Not assessed at this time Information / Referral to community resources:     Patient/Family's Response to care:  Patient and family agreeable to going to [facility] for End of life care. Patient/Family's Understanding of and Emotional Response to Diagnosis, Current Treatment, and Prognosis:  Patient's wife is sad but has seen his health deteriorate.  Emotional Assessment Appearance:  Appears stated  age Attitude/Demeanor/Rapport:  Unable to Assess Affect (typically observed):    Orientation:  Oriented to Self Alcohol / Substance use:  Not Applicable Psych involvement (Current and /or in the community):  No (Comment)  Discharge Needs  Concerns to be addressed:    Readmission within the last 30 days:  Yes (12-17-15) Current discharge risk:  None Barriers to Discharge:  No Barriers Identified   Ross Ludwig 12/21/2015, 4:32 PM

## 2015-12-21 NOTE — ED Provider Notes (Signed)
[facility] Emergency Department Provider Note   ____________________________________________    I have reviewed the triage vital signs and the nursing notes.   HISTORY  Chief Complaint Shortness of Breath  Patient unable to provide significant history   HPI [name] is a 75 y.o. male who presents with shortness of breath and altered mental status. Patient recently [facility] after being treated for possible aspiration pneumonia/sepsis. [facility]. Patient has stage IV lung CA with metastases. Apparently with decreased by mouth intake and hypoxia noted today.   Past Medical History:  Diagnosis Date  . Arthritis   . Blood transfusion without reported diagnosis    December 28, 2014 8 units   . Cancer ([place])    liver   . GERD (gastroesophageal reflux disease)   . Gout   . History of blood transfusion    Februrary 2017 4units   . Hyperlipidemia   . Hypertension   . Lung cancer ([place])   . 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 Prostate cancer ([place]) 03/2013   had seed implant and radiation for 5 weeks  . Type 2 diabetes mellitus ([place])   . Wears glasses     Patient Active Problem List   Diagnosis Date Noted  . Goals of care, counseling/discussion   . Palliative care encounter   . DNR (do not resuscitate) discussion   . Aspiration pneumonia of right lower lobe ([place])   . Sepsis ([place]) 12/08/2015  . DNR (do not resuscitate) 11/19/2015  . Palliative care by specialist 11/19/2015  . Brain metastases ([place]) 11/19/2015  . Primary cancer of right lower lobe of lung ([place]) 10/30/2015  . Generalized weakness 10/30/2015  . Metastases to the liver ([place]) 10/20/2015  . Multiple lung nodules 10/20/2015  . Iron deficiency anemia due to chronic blood loss 10/20/2015  . Prostate cancer ([place]) 10/20/2015  . Type 2 diabetes mellitus ([place]) 03/10/2013  . hypercholesterolemia 03/10/2013    . hypertension 03/10/2013  . 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 INGUINAL HERNIA 08/15/2008    Past Surgical History:  Procedure Laterality Date  . COLONOSCOPY  2010  . CYSTOSCOPY N/A 07/04/2013   Procedure: CYSTOSCOPY FLEXIBLE;  Surgeon: Claybon Jabs, MD;  Location: [facility];  Service: Urology;  Laterality: N/A;  . INGUINAL HERNIA REPAIR Left 2005  . LIVER BIOPSY Right 11/02/2015  . PROSTATE BIOPSY    . RADIOACTIVE SEED IMPLANT N/A 07/04/2013   Procedure: RADIOACTIVE SEED IMPLANT;  Surgeon: Claybon Jabs, MD;  Location: [facility];  Service: Urology;  Laterality: N/A;  . REPAIR CHRONIC INCARCERATED RECURRENT LEFT INGUINAL HERNIA  10-23-2008  . TRANSURETHRAL RESECTION OF BLADDER TUMOR N/A 01/02/2015   Procedure: Cystoscopy clot evalucation and fulgration;  Surgeon: Kathie Rhodes, MD;  Location: [facility];  Service: Urology;  Laterality: N/A;    Prior to Admission medications   Medication Sig Start Date End Date Taking? Authorizing Provider  acetaminophen (TYLENOL) 325 MG tablet Take 2 tablets (650 mg total) by mouth every 6 (six) hours as needed for mild pain (or Fever >/= 101). 12/15/15  Yes Loletha Grayer, MD  amoxicillin-clavulanate (AUGMENTIN) 875-125 MG tablet Take 1 tablet by mouth every 12 (twelve) hours. 12/17/15  Yes Richard Leslye Peer, MD  dexamethasone (DECADRON) 4 MG tablet Take 1 tablet (4 mg total) by mouth 2 (two) times daily. 12/15/15  Yes Loletha Grayer, MD  ferrous sulfate 325 (65 FE) MG tablet Take 1 tablet (325 mg total) by mouth 2 (two) times daily with a meal. 03/20/15  Yes Lavina Hamman, MD  hydrocortisone (ANUSOL-HC) 25 MG suppository Place 1 suppository (25 mg total) rectally 2 (two) times daily. 12/17/15  Yes Richard Leslye Peer, MD  insulin glargine (LANTUS) 100 UNIT/ML injection Inject 0.1 mLs (10 Units total) into the skin daily. 12/15/15  Yes Loletha Grayer, MD  tamsulosin  (FLOMAX) 0.4 MG CAPS capsule Take 2 capsules (0.8 mg total) by mouth daily after supper. 12/15/15  Yes Loletha Grayer, MD  docusate sodium (COLACE) 100 MG capsule Take 1 capsule (100 mg total) by mouth daily as needed for mild constipation. 12/15/15   Loletha Grayer, MD  feeding supplement, GLUCERNA SHAKE, (GLUCERNA SHAKE) LIQD Take 237 mLs by mouth 3 (three) times daily between meals. 12/15/15   Loletha Grayer, MD     Allergies Patient has no known allergies.  Family History  Problem Relation Age of Onset  . Cancer Mother     ovarian  . Heart disease Father   . Cancer Sister     breast  . Breast cancer Sister   . Cancer Brother     lung  . Cancer Brother     prostate  . Prostate cancer Brother   . Breast cancer Sister   . Lung cancer Brother   . Lung cancer Brother     Social History Social History  Substance Use Topics  . Smoking status: Former Smoker    Packs/day: 1.00    Years: 30.00    Types: Cigarettes    Quit date: 02/01/2008  . Smokeless tobacco: Never Used  . Alcohol use No    Review of Systems: Unable to obtain, level V caveat    ____________________________________________   PHYSICAL EXAM:  VITAL SIGNS: ED Triage Vitals  Enc Vitals Group     BP      Pulse      Resp      Temp      Temp src      SpO2      Weight      Height      Head Circumference      Peak Flow      Pain Score      Pain Loc      Pain Edu?      Excl. in [facility]?     Constitutional: Opens eyes to questions. Chronically ill-appearing Eyes: Conjunctivae are normal.  Head: Atraumatic.  Mouth/Throat: Mucous membranes are dry    Cardiovascular: Tachycardia, regular rhythm. Grossly normal heart sounds.  Good peripheral circulation. Respiratory: Increased respiratory effort without tachypnea.  No retractions. No rales bibasilarly Gastrointestinal: Mild suprapubic discomfort. No CVA tenderness. Genitourinary: deferred Musculoskeletal: No lower extremity tenderness nor edema.   Warm and well perfused Neurologic:  Unable to examine thoroughly, appears to move all extremities Skin:  Skin is warm, dry and intact. No rash noted.   ____________________________________________   LABS (all labs ordered are listed, but only abnormal results are displayed)  Labs Reviewed  LACTIC ACID, PLASMA - Abnormal; Notable for the following:       Result Value   Lactic Acid, Venous 5.6 (*)    All other components within normal limits  COMPREHENSIVE METABOLIC PANEL - Abnormal; Notable for the following:    Sodium 154 (*)    Potassium 5.3 (*)    Chloride 116 (*)    CO2 20 (*)  Glucose, Bld 366 (*)    BUN 75 (*)    Creatinine, Ser 1.73 (*)    Albumin 2.3 (*)    AST 185 (*)    ALT 139 (*)    Alkaline Phosphatase 537 (*)    Total Bilirubin 3.3 (*)    GFR calc non Af Amer 37 (*)    GFR calc Af Amer 42 (*)    Anion gap 18 (*)    All other components within normal limits  CBC WITH DIFFERENTIAL/PLATELET - Abnormal; Notable for the following:    WBC 32.0 (*)    Hemoglobin 12.2 (*)    HCT 39.0 (*)    MCV 79.5 (*)    MCH 24.9 (*)    MCHC 31.3 (*)    RDW 21.8 (*)    Neutro Abs 30.0 (*)    Lymphs Abs 0.7 (*)    Monocytes Absolute 1.1 (*)    Basophils Absolute 0.2 (*)    All other components within normal limits  TROPONIN I - Abnormal; Notable for the following:    Troponin I 0.10 (*)    All other components within normal limits  BRAIN NATRIURETIC PEPTIDE - Abnormal; Notable for the following:    B Natriuretic Peptide 255.0 (*)    All other components within normal limits  BLOOD GAS, ARTERIAL - Abnormal; Notable for the following:    Acid-base deficit 3.3 (*)    All other components within normal limits  URINALYSIS COMPLETEWITH MICROSCOPIC ([facility] ONLY) - Abnormal; Notable for the following:    Color, Urine AMBER (*)    APPearance HAZY (*)    Glucose, UA >500 (*)    Ketones, ur TRACE (*)    Hgb urine dipstick 3+ (*)    Leukocytes, UA 1+ (*)    Bacteria, UA RARE  (*)    Squamous Epithelial / LPF 0-5 (*)    All other components within normal limits  CULTURE, BLOOD (ROUTINE X 2)  CULTURE, BLOOD (ROUTINE X 2)  URINE CULTURE  LIPASE, BLOOD  PROCALCITONIN   ____________________________________________  EKG  ED ECG REPORT I, Lavonia Drafts, the attending physician, personally viewed and interpreted this ECG.  Date: 12/21/2015  Rate: 130 Rhythm: Sinus tachycardia QRS Axis: normal Intervals: normal ST/T Wave abnormalities: normal Conduction Disturbances: none   ____________________________________________  RADIOLOGY  Chest x-ray unremarkable ____________________________________________   PROCEDURES  Procedure(s) performed: No    Critical Care performed: yes  CRITICAL CARE Performed by: Lavonia Drafts   Total critical care time: 35 minutes  Critical care time was exclusive of separately billable procedures and treating other patients.  Critical care was necessary to treat or prevent imminent or life-threatening deterioration.  Critical care was time spent personally by me on the following activities: development of treatment plan with patient and/or surrogate as well as nursing, discussions with consultants, evaluation of patient's response to treatment, examination of patient, obtaining history from patient or surrogate, ordering and performing treatments and interventions, ordering and review of laboratory studies, ordering and review of radiographic studies, pulse oximetry and re-evaluation of patient's condition.  ____________________________________________   INITIAL IMPRESSION / ASSESSMENT AND PLAN / ED COURSE  Pertinent labs & imaging results that were available during my care of the patient were reviewed by me and considered in my medical decision making (see chart for details).  Patient with stage IV lung cancer, chronically ill with very poor prognosis. On review of chart patient is apparently full code. Strongly  suspect sepsis given tachycardia, shortness of  breath and recent pneumonia. Code sepsis called  Clinical Course   Chest x-ray does not demonstrate clear pneumonia, urinalysis shows UTI, covered broadly with antibiotics. 30 MLS per kilogram of fluids given, admitted to hospitalist service ____________________________________________   FINAL CLINICAL IMPRESSION(S) / ED DIAGNOSES  Final diagnoses:  Sepsis, due to unspecified organism [facility])      NEW MEDICATIONS STARTED DURING THIS VISIT:  Current Discharge Medication List       Note:  This document was prepared using Dragon voice recognition software and may include unintentional dictation errors.    Lavonia Drafts, MD 12/21/15 [phone]

## 2015-12-21 NOTE — Clinical Social Work Note (Signed)
Patient to be d/c'ed today to [facility]. Patient and family agreeable to plans will transport via [facility] RN will call report.  Evette Cristal, MSW Mon-Fri 8a-4:30p [phone]

## 2015-12-21 NOTE — Progress Notes (Signed)
While rounding, [place] made a follow up visit. Nurse informed me that the Pt was being transitioned to [facility]. Pt was asleep and so I offered silent prayer at bedside. CH is available for follow up as needed.    12/21/15 1500  Clinical Encounter Type  Visited With Patient;Health care provider  Visit Type Follow-up;Spiritual support  Referral From Nurse  Spiritual Encounters  Spiritual Needs Prayer

## 2015-12-22 ENCOUNTER — Ambulatory Visit: Payer: Medicare Other

## 2015-12-22 LAB — URINE CULTURE: Culture: 80000 — AB

## 2015-12-23 ENCOUNTER — Ambulatory Visit: Payer: Medicare Other

## 2015-12-28 ENCOUNTER — Ambulatory Visit: Payer: Medicare Other

## 2015-12-29 ENCOUNTER — Ambulatory Visit: Payer: Medicare Other

## 2015-12-30 ENCOUNTER — Ambulatory Visit: Payer: Medicare Other

## 2015-12-31 ENCOUNTER — Inpatient Hospital Stay: Payer: No Typology Code available for payment source | Admitting: Internal Medicine

## 2015-12-31 ENCOUNTER — Other Ambulatory Visit: Payer: Medicare Other

## 2015-12-31 ENCOUNTER — Inpatient Hospital Stay: Payer: No Typology Code available for payment source

## 2015-12-31 ENCOUNTER — Ambulatory Visit: Payer: Medicare Other

## 2015-12-31 ENCOUNTER — Ambulatory Visit: Payer: Medicare Other | Admitting: Internal Medicine

## 2016-01-01 ENCOUNTER — Ambulatory Visit: Payer: Medicare Other

## 2016-01-01 DEATH — deceased

## 2016-01-04 ENCOUNTER — Ambulatory Visit: Payer: Medicare Other

## 2016-01-05 ENCOUNTER — Ambulatory Visit: Payer: Medicare Other

## 2016-01-20 ENCOUNTER — Ambulatory Visit: Payer: Medicare Other | Admitting: Internal Medicine

## 2016-01-20 ENCOUNTER — Other Ambulatory Visit: Payer: Medicare Other

## 2016-01-22 LAB — CULTURE, BLOOD (ROUTINE X 2)
CULTURE: NO GROWTH
Culture: NO GROWTH

## 2016-02-08 ENCOUNTER — Ambulatory Visit: Payer: Medicare Other | Admitting: Radiation Oncology

## 2016-04-11 NOTE — Addendum Note (Signed)
Encounter addended by: Vernie Murders, RT on: 04/11/2016 11:28 AM<BR>    Actions taken: Imaging Exam ended, Charge Capture section accepted

## 2016-05-13 ENCOUNTER — Other Ambulatory Visit: Payer: Self-pay | Admitting: Nurse Practitioner

## 2017-02-22 IMAGING — CT CT CHEST W/ CM
2 of 3 series · 15 of 36 positions shown, 18 images · IV contrast (iopamidol)
Comparison: 10/12/2015

CLINICAL DATA: Pneumonia. History of stage IV lung cancer with
known metastatic disease. Shortness of breath and cough.

EXAM:
CT CHEST WITH CONTRAST
TECHNIQUE: Multidetector CT imaging of the chest was performed during
intravenous contrast administration.
CONTRAST:  75mL [id number] IOPAMIDOL ([id number]) INJECTION 61%

[Series 2: axial st · axial · 0.73mm/px · z∈[-249,+5]mm · 12 of 151 slices shown, 15 images]
[im 12/151  mediastinal]
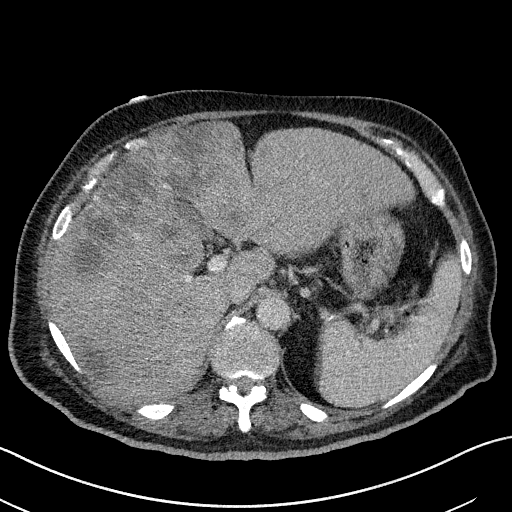
[im 12/151  lung]
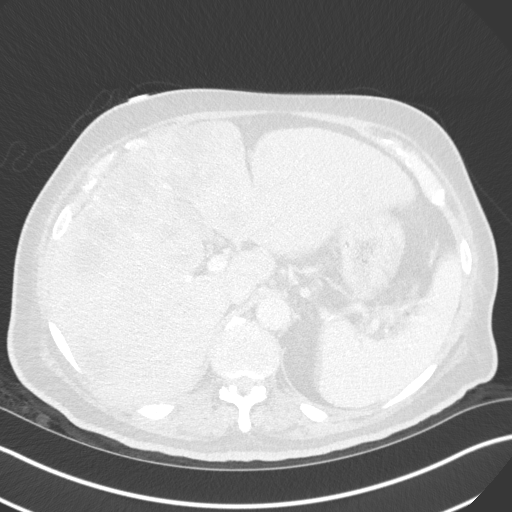
[im 23/151  lung]
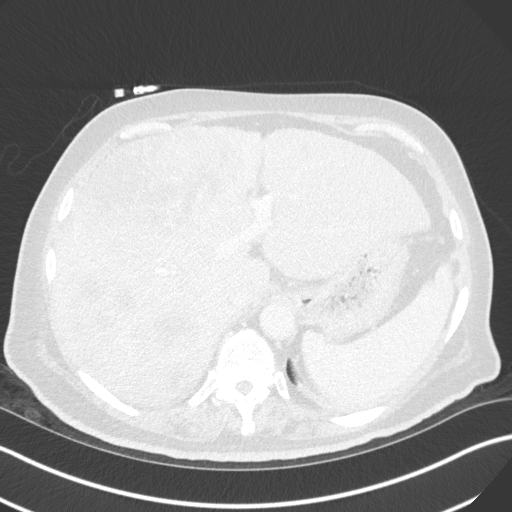
[im 34/151  lung]
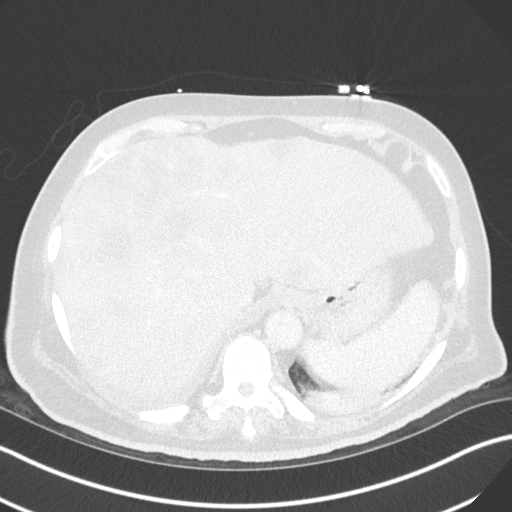
[im 45/151  lung]
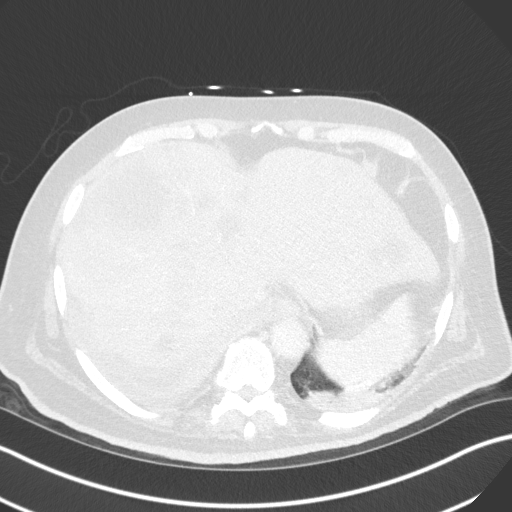
[im 56/151  mediastinal]
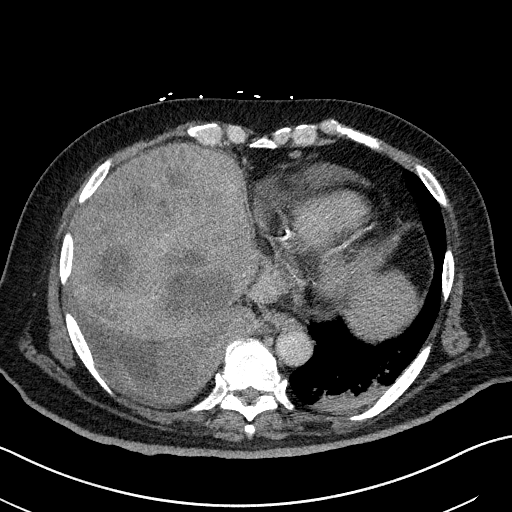
[im 56/151  lung]
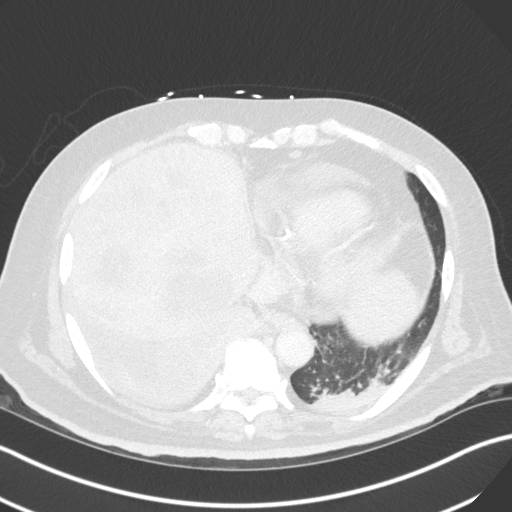
[im 67/151  lung]
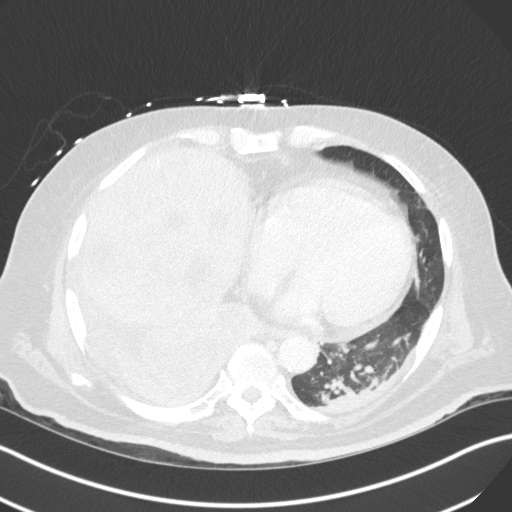
[im 84/151  lung]
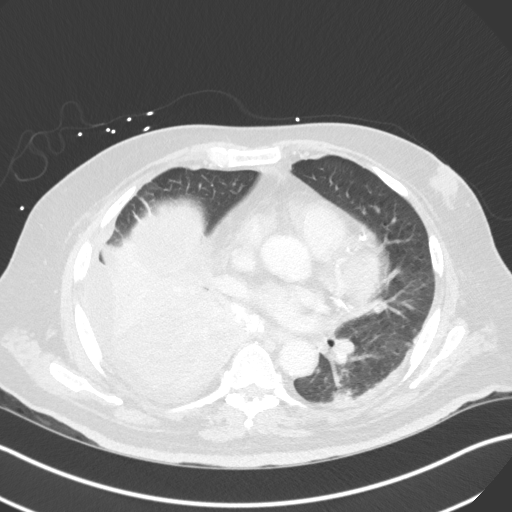
[im 95/151  lung]
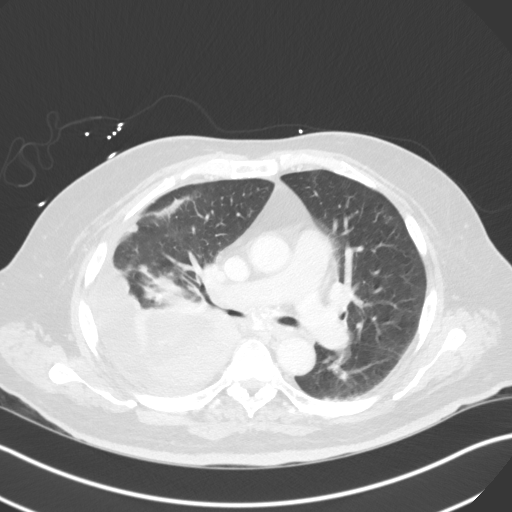
[im 106/151  mediastinal]
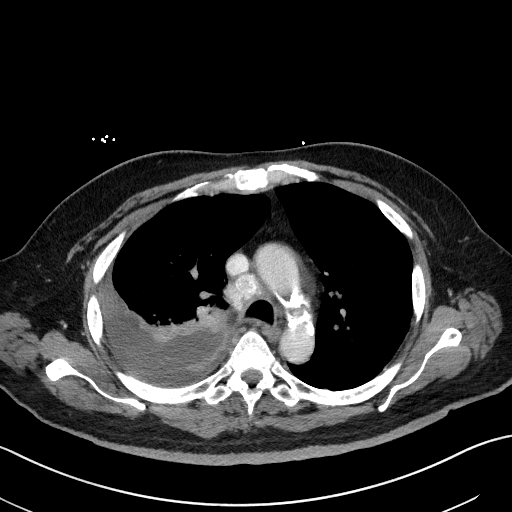
[im 106/151  lung]
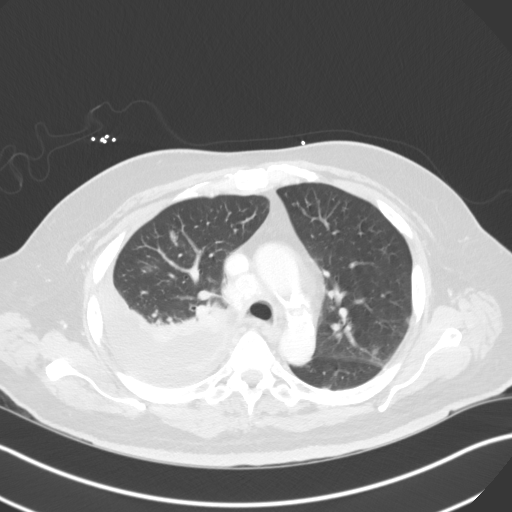
[im 117/151  lung]
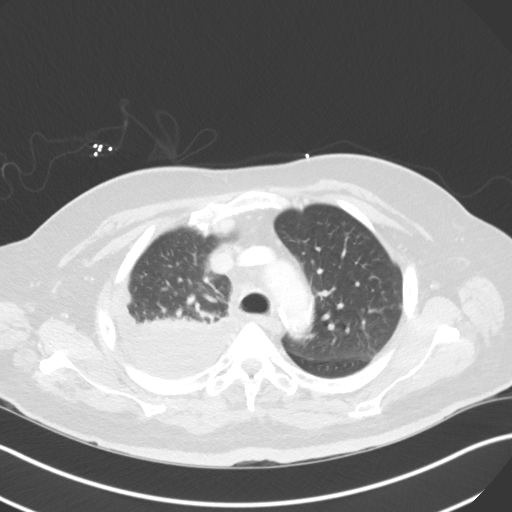
[im 128/151  lung]
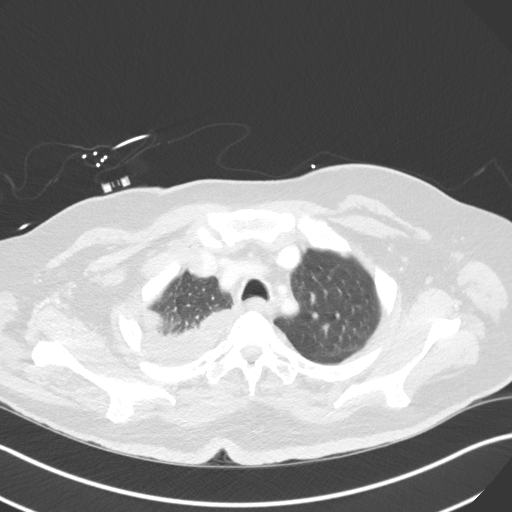
[im 139/151  lung]
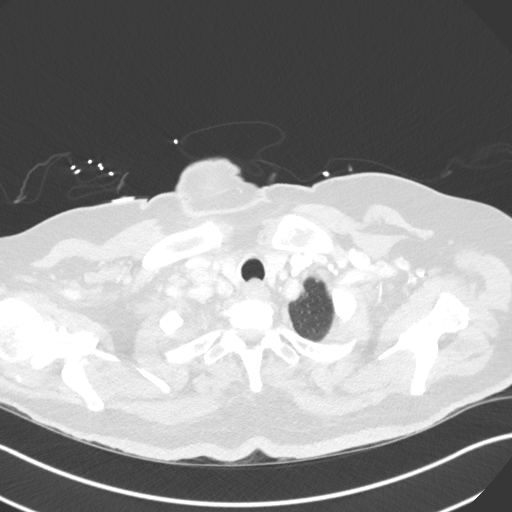

[Series 5: coronal · coronal · 0.60mm/px · 3 of 124 slices shown]
[im 25/124  lung]
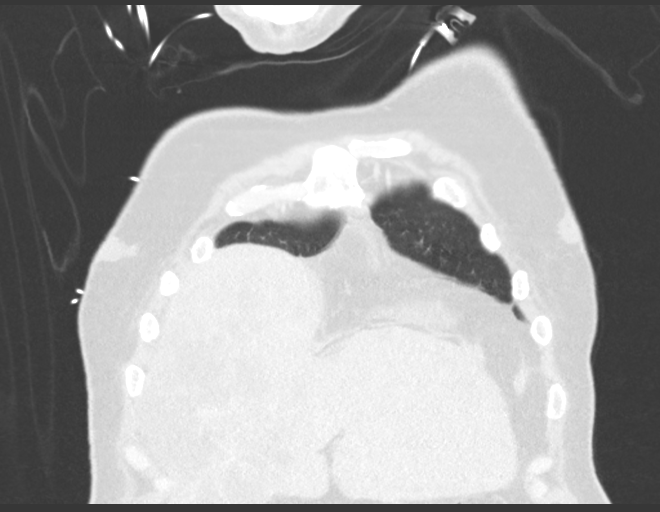
[im 50/124  lung]
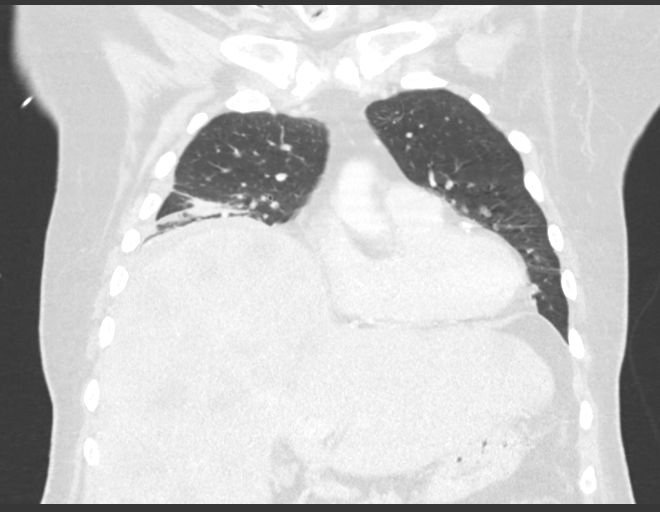
[im 74/124  lung]
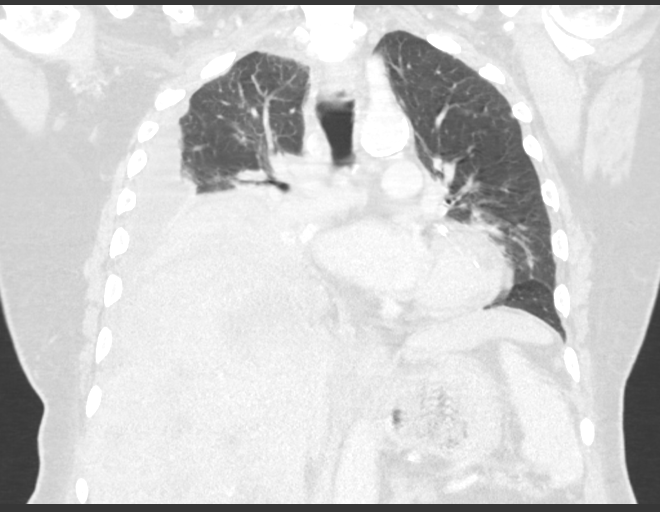

[15 of 36 positions shown; findings below may reference images not displayed]

FINDINGS: Cardiovascular: The heart is enlarged. There is a small pericardial
effusion. Calcific atherosclerotic disease of the coronary arteries
and aortic arch is seen.

Mediastinum/Nodes: There are enlarged right sided lymph nodes with
the largest pretracheal lymph node measuring 22 in short axis,
stable.

Lungs/Pleura: There has been interval development of loculated right
pleural effusion measuring water density. There are secondary
atelectatic changes in the right lung base. Nodularity along the
pleural surface of the right upper and right lower lobe likely
represents metastatic disease. The previously demonstrated right
lower lobe medial mass is not as well seen due to the presence of
atelectatic changes multiple bilateral soft tissue masses have
decreased in size, likely due to therapy. For example 2 soft tissue
masses in the anterior portion of the right upper lobe measuring 11
and 8 mm on the prior CT dated 10/12/2015 have decreased to 5 mm,
image 41/151, sequence 3. Similar decrease in size is noted in
multiple other bilateral soft tissue pulmonary nodules. There has
been interval development of airspace consolidation in the left
lower lobe which may represent atelectasis or infectious
consolidation.

Upper Abdomen: Numerous coalescing metastatic lesions throughout the
liver are more prominent.

Musculoskeletal: No chest wall abnormality. No acute or significant
osseous findings.
IMPRESSION: Interval development of moderate in size water density right pleural
effusion with nodular thickening of the posterior pleura, likely
metastatic.

Interval decrease in the size of multiple bilateral pulmonary
masses, likely representing good response to therapy.

Interval development of left lower lobe atelectasis versus airspace
consolidation.

Persistent mediastinal lymphadenopathy.

Numerous coalescing metastatic liver lesions, more prominent on
today's exam.
